# Patient Record
Sex: Male | Born: 1935 | Race: White | Hispanic: No | Smoking: Former smoker
Health system: Southern US, Community
[De-identification: ages and names within clinical notes are randomized; demographics above are authoritative.]

## PROBLEM LIST (undated history)

## (undated) DIAGNOSIS — E039 Hypothyroidism, unspecified: Secondary | ICD-10-CM

## (undated) DIAGNOSIS — R0600 Dyspnea, unspecified: Secondary | ICD-10-CM

## (undated) DIAGNOSIS — Z8489 Family history of other specified conditions: Secondary | ICD-10-CM

## (undated) DIAGNOSIS — K449 Diaphragmatic hernia without obstruction or gangrene: Secondary | ICD-10-CM

## (undated) DIAGNOSIS — N289 Disorder of kidney and ureter, unspecified: Secondary | ICD-10-CM

## (undated) DIAGNOSIS — E119 Type 2 diabetes mellitus without complications: Secondary | ICD-10-CM

## (undated) DIAGNOSIS — E7800 Pure hypercholesterolemia, unspecified: Secondary | ICD-10-CM

## (undated) DIAGNOSIS — I10 Essential (primary) hypertension: Secondary | ICD-10-CM

## (undated) DIAGNOSIS — K219 Gastro-esophageal reflux disease without esophagitis: Secondary | ICD-10-CM

## (undated) DIAGNOSIS — T8859XA Other complications of anesthesia, initial encounter: Secondary | ICD-10-CM

## (undated) DIAGNOSIS — C3490 Malignant neoplasm of unspecified part of unspecified bronchus or lung: Secondary | ICD-10-CM

## (undated) DIAGNOSIS — M1990 Unspecified osteoarthritis, unspecified site: Secondary | ICD-10-CM

## (undated) HISTORY — PX: APPENDECTOMY: SHX54

## (undated) HISTORY — PX: TONSILLECTOMY: SUR1361

## (undated) HISTORY — PX: CERVICAL SPINE SURGERY: SHX589

## (undated) HISTORY — PX: EYE SURGERY: SHX253

---

## 2002-05-03 ENCOUNTER — Ambulatory Visit (HOSPITAL_COMMUNITY): Admission: RE | Admit: 2002-05-03 | Discharge: 2002-05-03 | Payer: Self-pay | Admitting: Pulmonary Disease

## 2002-05-16 ENCOUNTER — Ambulatory Visit (HOSPITAL_COMMUNITY): Admission: RE | Admit: 2002-05-16 | Discharge: 2002-05-16 | Payer: Self-pay | Admitting: Pulmonary Disease

## 2002-08-31 ENCOUNTER — Encounter: Payer: Self-pay | Admitting: Neurosurgery

## 2002-09-06 ENCOUNTER — Encounter: Payer: Self-pay | Admitting: Neurosurgery

## 2002-09-06 ENCOUNTER — Inpatient Hospital Stay (HOSPITAL_COMMUNITY): Admission: RE | Admit: 2002-09-06 | Discharge: 2002-09-08 | Payer: Self-pay | Admitting: Neurosurgery

## 2003-01-05 ENCOUNTER — Ambulatory Visit (HOSPITAL_COMMUNITY): Admission: RE | Admit: 2003-01-05 | Discharge: 2003-01-05 | Payer: Self-pay | Admitting: General Surgery

## 2004-11-21 ENCOUNTER — Ambulatory Visit (HOSPITAL_COMMUNITY): Admission: RE | Admit: 2004-11-21 | Discharge: 2004-11-21 | Payer: Self-pay | Admitting: Pulmonary Disease

## 2005-12-10 ENCOUNTER — Ambulatory Visit (HOSPITAL_COMMUNITY): Admission: RE | Admit: 2005-12-10 | Discharge: 2005-12-10 | Payer: Self-pay | Admitting: Pulmonary Disease

## 2006-06-15 ENCOUNTER — Ambulatory Visit (HOSPITAL_COMMUNITY): Admission: RE | Admit: 2006-06-15 | Discharge: 2006-06-15 | Payer: Self-pay | Admitting: Pulmonary Disease

## 2008-04-22 ENCOUNTER — Emergency Department (HOSPITAL_COMMUNITY): Admission: EM | Admit: 2008-04-22 | Discharge: 2008-04-22 | Payer: Self-pay | Admitting: Emergency Medicine

## 2009-01-30 ENCOUNTER — Ambulatory Visit (HOSPITAL_COMMUNITY): Admission: RE | Admit: 2009-01-30 | Discharge: 2009-01-30 | Payer: Self-pay | Admitting: Pulmonary Disease

## 2009-02-24 ENCOUNTER — Emergency Department (HOSPITAL_COMMUNITY): Admission: EM | Admit: 2009-02-24 | Discharge: 2009-02-24 | Payer: Self-pay | Admitting: Family Medicine

## 2010-03-05 ENCOUNTER — Ambulatory Visit (HOSPITAL_COMMUNITY): Admission: RE | Admit: 2010-03-05 | Discharge: 2010-03-05 | Payer: Self-pay | Admitting: General Surgery

## 2010-06-17 ENCOUNTER — Ambulatory Visit (HOSPITAL_COMMUNITY): Admission: RE | Admit: 2010-06-17 | Discharge: 2010-06-17 | Payer: Self-pay | Admitting: Pulmonary Disease

## 2010-11-12 ENCOUNTER — Other Ambulatory Visit (HOSPITAL_COMMUNITY): Payer: Self-pay | Admitting: Pulmonary Disease

## 2010-11-14 ENCOUNTER — Ambulatory Visit (HOSPITAL_COMMUNITY)
Admission: RE | Admit: 2010-11-14 | Discharge: 2010-11-14 | Disposition: A | Discharge status: Home / Self Care | Payer: MEDICARE | Source: Ambulatory Visit | Attending: Pulmonary Disease | Admitting: Pulmonary Disease

## 2010-11-14 DIAGNOSIS — Z09 Encounter for follow-up examination after completed treatment for conditions other than malignant neoplasm: Secondary | ICD-10-CM | POA: Insufficient documentation

## 2010-11-14 DIAGNOSIS — K7689 Other specified diseases of liver: Secondary | ICD-10-CM | POA: Insufficient documentation

## 2010-11-14 DIAGNOSIS — R935 Abnormal findings on diagnostic imaging of other abdominal regions, including retroperitoneum: Secondary | ICD-10-CM | POA: Insufficient documentation

## 2010-11-14 MED ORDER — IOHEXOL 300 MG/ML  SOLN
100.0000 mL | Freq: Once | INTRAMUSCULAR | Status: AC | PRN
  Administered 2010-11-14: 100 mL via INTRAVENOUS

## 2011-01-14 LAB — ROCKY MTN SPOTTED FVR AB, IGG-BLOOD: RMSF IgG: 0.34 IV

## 2011-01-14 LAB — ROCKY MTN SPOTTED FVR AB, IGM-BLOOD: RMSF IgM: 0.34 IV (ref 0.00–0.89)

## 2011-02-21 NOTE — Op Note (Signed)
NAME:  William Wells, STIEG NO.:  [id number]   MEDICAL RECORD NO.:  [id number]                   PATIENT TYPE:  INP   LOCATION:  NA                                   FACILITY:  [facility]   PHYSICIAN:  Marchia Meiers. Vertell Limber, M.D.               DATE OF BIRTH:  08-30-1934   DATE OF PROCEDURE:  09/06/2002  DATE OF DISCHARGE:                                 OPERATIVE REPORT   PREOPERATIVE DIAGNOSIS:  Cervical spondylosis with myelopathy, cervical  spinal stenosis C3 through C6 levels with degenerative disk disease and  cervical radiculopathy.   POSTOPERATIVE DIAGNOSIS:  Cervical spondylosis with myelopathy, cervical  spinal stenosis C3 through C6 levels with degenerative disk disease and  cervical radiculopathy.   OPERATION PERFORMED:  Posterior cervical laminectomy C2-3 to C5-7 levels.   SURGEON:  Marchia Meiers. Vertell Limber, M.D.   ASSISTANT:  Ashok Pall, M.D.   ANESTHESIA:  General endotracheal.   ESTIMATED BLOOD LOSS:  100 cc.   COMPLICATIONS:  None.   DISPOSITION:  Recovery.   INDICATIONS FOR PROCEDURE:  The patient is a 77 year old man with cervical  spondylitic myelopathy.  He has had an autofusion at C4-5 and has marked  cervical spondylosis with canal compression from C3 through the C6 levels.  It was elected to take him to surgery for decompressive cervical laminectomy  for spinal stenosis.   DESCRIPTION OF PROCEDURE:  [name] was brought to the operating room.  Following the satisfactory and uncomplicated induction of general  endotracheal anesthesia and placement of intravenous lines and a Foley  catheter, he was turned into prone position on the operating table after  placing his head in three pin head fixation.  His neck was maintained in  neutral alignment.  His posterior neck was then prepped and draped in the  usual sterile fashion.  The area of planned incision was infiltrated with  0.25% Marcaine and 0.5% lidocaine 1:200,000 epinephrine.  An  incision was  made in the midline and carried through the subcutaneous tissues to the  posterior cervical fascia which was incised bilaterally.  Subperiosteal  dissection was performed exposing the C2 through C6 spinous processes and  subperiosteal dissection was performed exposing the C3 through C6 lateral  masses.  Self-retaining retractors were placed to facilitate exposure after  confirmation of level with markers at the C3 and C4 spinous processes.  The  C3 through C6 spinous processes were removed and using Leksell rongeur, 2  and 3 mm gold tipped Kerrison rongeurs and the high speed drill with AM8  equivalent bur, a total laminectomy C3 through C6 was performed.  The spinal  canal dura was decompressed extensively over this region resulting in marked  decompression of the spinal canal.  Hemostasis was assured with bipolar  electrocautery, Gelfoam soaked in thrombin and also with bone wax on the  bony edges.  The self-retaining retractors were then removed  and tissues  were inspected and found to be in good repair with no evidence of any  bleeding. Nonetheless, a 7 mm Jackson-Pratt drain was placed to assure a dry  field.  The posterior cervical fascia was then closed with 0 Vicryl sutures.  Subcutaneous tissues were reapproximated with 0 and 2-0 Vicryl inverted  interrupted sutures and the skin edges were reapproximated with interrupted  3-0 Vicryl subcuticular stitch, the wound was dressed with Dermabond.  A 3-0  nylon drain stitch was placed with occlusive dressing over that.  The  patient was then returned to supine position and taken out of three pin head  fixation and was extubated in the operating room and taken to the recovery  room in stable and satisfactory condition having tolerated the operation  well.  Counts were correct at the end of the case.  The entire operation was  done under loupe magnification to facilitate visualization.                                                  Marchia Meiers. Vertell Limber, M.D.    JDS/MEDQ  D:  09/06/2002  T:  09/06/2002  Job:  [id number]

## 2011-02-21 NOTE — H&P (Signed)
   NAME:  William Wells, GEGENHEIMER NO.:  [id number]   MEDICAL RECORD NO.:  [id number]                  PATIENT TYPE:   LOCATION:                                       FACILITY:   PHYSICIAN:  Jamesetta So, M.D.               DATE OF BIRTH:  28-Nov-1933   DATE OF ADMISSION:  DATE OF DISCHARGE:                                HISTORY & PHYSICAL   CHIEF COMPLAINT:  Hematochezia.   HISTORY OF PRESENT ILLNESS:  The patient is a 77 year old white male who is  referred for a colonoscopy.  He has been having hematochezia over the past  few months.  He denies any other abdominal complaints.  He denies  hemorrhoidal problems.  He has never had a colonoscopy.  There is no  immediate family history of colon carcinoma.   PAST MEDICAL HISTORY:  Indigestion.   PAST SURGICAL HISTORY:  1. Neck surgery.  2. Appendectomy.   CURRENT MEDICATIONS:  Nexium.   ALLERGIES:  No known drug allergies.   REVIEW OF SYSTEMS:  Noncontributory.   PHYSICAL EXAMINATION:  GENERAL:  The patient is a well-developed, well-  nourished white male in no acute distress.  He is afebrile.  VITAL SIGNS:  Stable.  LUNGS:  Clear to auscultation with equal breath sounds bilaterally.  HEART:  Regular rate and rhythm without S3, S4 or murmurs.  ABDOMEN:  Soft, nontender, nondistended.  No hepatosplenomegaly, masses or  hernias are noted.  RECTAL:  Deferred to the procedure.   IMPRESSION:  Hematochezia.    PLAN:  The patient is scheduled for a colonoscopy on 01/05/03.  The risks  and benefits of the procedure including bleeding and perforation were fully  explained to the patient, gave informed consent.                                                 Jamesetta So, M.D.    MAJ/MEDQ  D:  12/29/2002  T:  12/29/2002  Job:  [id number]   cc:   Percell Miller L. Luan Pulling, M.D.  [place]  [place] [address]  Fax: [phone]

## 2011-07-04 LAB — URINALYSIS, ROUTINE W REFLEX MICROSCOPIC
Bilirubin Urine: NEGATIVE
Hgb urine dipstick: NEGATIVE
Ketones, ur: NEGATIVE
Protein, ur: NEGATIVE
Urobilinogen, UA: 0.2

## 2011-07-04 LAB — COMPREHENSIVE METABOLIC PANEL
ALT: 37
AST: 23
Albumin: 3.3 — ABNORMAL LOW
Alkaline Phosphatase: 50
Glucose, Bld: 132 — ABNORMAL HIGH
Potassium: 3.9
Sodium: 133 — ABNORMAL LOW
Total Protein: 6.6

## 2011-07-04 LAB — CBC
Hemoglobin: 14.2
RDW: 14

## 2011-07-04 LAB — DIFFERENTIAL
Basophils Relative: 1
Eosinophils Absolute: 0.1
Eosinophils Relative: 2
Monocytes Absolute: 0.8
Monocytes Relative: 16 — ABNORMAL HIGH
Neutrophils Relative %: 41 — ABNORMAL LOW

## 2011-10-25 ENCOUNTER — Emergency Department (HOSPITAL_COMMUNITY)
Admission: EM | Admit: 2011-10-25 | Discharge: 2011-10-25 | Disposition: A | Discharge status: Home / Self Care | Payer: Medicare Other | Source: Home / Self Care

## 2011-10-25 ENCOUNTER — Encounter (HOSPITAL_COMMUNITY): Payer: Self-pay | Admitting: *Deleted

## 2011-10-25 DIAGNOSIS — M25569 Pain in unspecified knee: Secondary | ICD-10-CM

## 2011-10-25 DIAGNOSIS — M25562 Pain in left knee: Secondary | ICD-10-CM

## 2011-10-25 HISTORY — DX: Gastro-esophageal reflux disease without esophagitis: K21.9

## 2011-10-25 HISTORY — DX: Pure hypercholesterolemia, unspecified: E78.00

## 2011-10-25 MED ORDER — PERCOCET 5-325 MG PO TABS: 1.0000 | ORAL_TABLET | Freq: Four times a day (QID) | ORAL | Status: AC | PRN

## 2011-10-25 NOTE — ED Provider Notes (Signed)
History     CSN: [id number]  Arrival date & time 10/25/11  1058   None     Chief Complaint  Patient presents with  . Knee Pain  . Leg Pain  . Fall    (Consider location/radiation/quality/duration/timing/severity/associated sxs/prior treatment) HPI Comments: Patient reports he fell and twisted his left knee in December - was seen in ED in [place] and put in knee immobilizer and walker with PCP follow up.  Patient has since called his PCP but has not been seen, has stopped wearing the immobilizer and using the walker.  Reports continued pain at medial aspect of knee, exacerbated by palpation and walking.  Pain is described as aching.  Denies any new injury to knee.  Denies fever, swelling of leg, CP, SOB. 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  Is taking hydrocodone-APAP 5-500 and naprosyn without relief.    Patient is a 78 y.o. male presenting with knee pain, leg pain, and fall. The history is provided by the patient.  Knee Pain  Leg Pain   Fall    Past Medical History  Diagnosis Date  . Diabetes mellitus   . Acid reflux disease   . High cholesterol     History reviewed. No pertinent past surgical history.  History reviewed. No pertinent family history.  History  Substance Use Topics  . Smoking status: Never Smoker   . Smokeless tobacco: Not on file  . Alcohol Use: No      Review of Systems  All other systems reviewed and are negative.    Allergies  Review of patient's allergies indicates no known allergies.  Home Medications   Current Outpatient Rx  Name Route Sig Dispense Refill  . HYDROCODONE-ACETAMINOPHEN 5-500 MG PO TABS Oral Take 1 tablet by mouth every 6 (six) hours as needed.    Marland Kitchen METFORMIN HCL 500 MG PO TABS Oral Take 1,000 mg by mouth 2 (two) times daily.     Marland Kitchen NAPROXEN 500 MG PO TABS Oral Take 500 mg by mouth 4 (four) times daily.    Marland Kitchen OMEPRAZOLE 20 MG PO CPDR Oral Take 20 mg by mouth daily.    Marland Kitchen  SIMVASTATIN 40 MG PO TABS Oral Take 40 mg by mouth every evening.      BP 133/73  Pulse 93  Temp(Src) 97.8 F (36.6 C) (Oral)  Resp 16  SpO2 98%  Physical Exam  Nursing note and vitals reviewed. Constitutional: He is oriented to person, place, and time. He appears well-developed and well-nourished.  HENT:  Head: Normocephalic and atraumatic.  Neck: Neck supple.  Pulmonary/Chest: Effort normal.  Musculoskeletal: Normal range of motion. He exhibits tenderness. He exhibits no edema.       Legs: Neurological: He is alert and oriented to person, place, and time.    ED Course  Procedures (including critical care time)  Labs Reviewed - No data to display No results found.   1. Pain in left knee       MDM  Patient with injury to left knee several weeks ago, patient has decreased conservative management at home with increased pain.  Patient has also not followed up with PCP for further evaluation.  No new injury.  Suspect injury to [facility] - patient unable to tolerate knee immobilizer.  Negotiated work note- patient states he needs to go back to work, is scheduled 5 days from now.  I have written him out for tomorrow's work to  allow him to follow up with orthopedist and with PCP for reevaluation if unable to see orthopedists prior to return to work.  Pt encouraged to use walker at home.  Discussed safety with use of tylenol-containing prescriptions.  Pt verbalizes understanding.          Otis Brace, [place] 10/25/11 1257

## 2011-10-25 NOTE — ED Notes (Signed)
Pt fell end of December injured left knee - seen and [place] time [place] - knee immobilizer and walker   Was told to follow up with primary care physician in [place] per pt has called twice no response

## 2011-10-25 NOTE — ED Provider Notes (Signed)
Medical screening examination/treatment/procedure(s) were performed by non-physician practitioner and as supervising physician I was immediately available for consultation/collaboration.  Howell Rucks, MD 10/25/11 2114

## 2011-10-25 NOTE — ED Notes (Signed)
Large knee sleeve applied

## 2012-08-23 ENCOUNTER — Encounter: Payer: Self-pay | Admitting: Family Medicine

## 2012-09-07 ENCOUNTER — Other Ambulatory Visit (HOSPITAL_COMMUNITY): Payer: Self-pay | Admitting: Pulmonary Disease

## 2012-09-07 DIAGNOSIS — R1032 Left lower quadrant pain: Secondary | ICD-10-CM

## 2012-09-10 ENCOUNTER — Ambulatory Visit (HOSPITAL_COMMUNITY)
Admission: RE | Admit: 2012-09-10 | Discharge: 2012-09-10 | Disposition: A | Discharge status: Home / Self Care | Payer: Medicare Other | Source: Ambulatory Visit | Attending: Pulmonary Disease | Admitting: Pulmonary Disease

## 2012-09-10 DIAGNOSIS — K5730 Diverticulosis of large intestine without perforation or abscess without bleeding: Secondary | ICD-10-CM | POA: Insufficient documentation

## 2012-09-10 DIAGNOSIS — R1032 Left lower quadrant pain: Secondary | ICD-10-CM | POA: Insufficient documentation

## 2013-12-07 ENCOUNTER — Other Ambulatory Visit (HOSPITAL_COMMUNITY): Payer: Self-pay | Admitting: Pulmonary Disease

## 2013-12-07 DIAGNOSIS — G44009 Cluster headache syndrome, unspecified, not intractable: Secondary | ICD-10-CM

## 2013-12-13 ENCOUNTER — Ambulatory Visit (HOSPITAL_COMMUNITY)
Admission: RE | Admit: 2013-12-13 | Discharge: 2013-12-13 | Disposition: A | Discharge status: Home / Self Care | Payer: Medicare HMO | Source: Ambulatory Visit | Attending: Pulmonary Disease | Admitting: Pulmonary Disease

## 2013-12-13 DIAGNOSIS — J329 Chronic sinusitis, unspecified: Secondary | ICD-10-CM | POA: Insufficient documentation

## 2013-12-13 DIAGNOSIS — G44009 Cluster headache syndrome, unspecified, not intractable: Secondary | ICD-10-CM

## 2013-12-13 DIAGNOSIS — R51 Headache: Secondary | ICD-10-CM | POA: Insufficient documentation

## 2014-10-18 ENCOUNTER — Encounter: Payer: Self-pay | Admitting: Family Medicine

## 2015-04-24 ENCOUNTER — Other Ambulatory Visit (HOSPITAL_COMMUNITY): Payer: Self-pay | Admitting: Pulmonary Disease

## 2015-04-24 ENCOUNTER — Ambulatory Visit (HOSPITAL_COMMUNITY)
Admission: RE | Admit: 2015-04-24 | Discharge: 2015-04-24 | Disposition: A | Discharge status: Home / Self Care | Payer: Medicare HMO | Source: Ambulatory Visit | Attending: Pulmonary Disease | Admitting: Pulmonary Disease

## 2015-04-24 DIAGNOSIS — M1612 Unilateral primary osteoarthritis, left hip: Secondary | ICD-10-CM | POA: Insufficient documentation

## 2015-04-24 DIAGNOSIS — M25552 Pain in left hip: Secondary | ICD-10-CM | POA: Diagnosis present

## 2016-08-18 ENCOUNTER — Encounter (HOSPITAL_COMMUNITY): Payer: Self-pay | Admitting: Emergency Medicine

## 2016-08-18 ENCOUNTER — Emergency Department (HOSPITAL_COMMUNITY)
Admission: EM | Admit: 2016-08-18 | Discharge: 2016-08-18 | Disposition: A | Discharge status: Home / Self Care | Payer: Medicare HMO | Attending: Emergency Medicine | Admitting: Emergency Medicine

## 2016-08-18 ENCOUNTER — Emergency Department (HOSPITAL_COMMUNITY): Payer: Medicare HMO

## 2016-08-18 DIAGNOSIS — Z79899 Other long term (current) drug therapy: Secondary | ICD-10-CM | POA: Diagnosis not present

## 2016-08-18 DIAGNOSIS — Y929 Unspecified place or not applicable: Secondary | ICD-10-CM | POA: Diagnosis not present

## 2016-08-18 DIAGNOSIS — E119 Type 2 diabetes mellitus without complications: Secondary | ICD-10-CM | POA: Insufficient documentation

## 2016-08-18 DIAGNOSIS — Z87891 Personal history of nicotine dependence: Secondary | ICD-10-CM | POA: Insufficient documentation

## 2016-08-18 DIAGNOSIS — W19XXXA Unspecified fall, initial encounter: Secondary | ICD-10-CM

## 2016-08-18 DIAGNOSIS — S4991XA Unspecified injury of right shoulder and upper arm, initial encounter: Secondary | ICD-10-CM | POA: Diagnosis present

## 2016-08-18 DIAGNOSIS — Y9301 Activity, walking, marching and hiking: Secondary | ICD-10-CM | POA: Insufficient documentation

## 2016-08-18 DIAGNOSIS — W010XXA Fall on same level from slipping, tripping and stumbling without subsequent striking against object, initial encounter: Secondary | ICD-10-CM | POA: Diagnosis not present

## 2016-08-18 DIAGNOSIS — S40011A Contusion of right shoulder, initial encounter: Secondary | ICD-10-CM

## 2016-08-18 DIAGNOSIS — Y999 Unspecified external cause status: Secondary | ICD-10-CM | POA: Insufficient documentation

## 2016-08-18 DIAGNOSIS — Z7984 Long term (current) use of oral hypoglycemic drugs: Secondary | ICD-10-CM | POA: Insufficient documentation

## 2016-08-18 MED ORDER — HYDROCODONE-ACETAMINOPHEN 5-325 MG PO TABS
1.0000 | ORAL_TABLET | Freq: Once | ORAL | Status: AC
  Administered 2016-08-18: 1 via ORAL
  Filled 2016-08-18: qty 1

## 2016-08-18 MED ORDER — HYDROCODONE-ACETAMINOPHEN 5-325 MG PO TABS: 1.0000 | ORAL_TABLET | ORAL | 0 refills | Status: DC | PRN

## 2016-08-18 NOTE — ED Provider Notes (Signed)
[facility] DEPT Provider Note   CSN: [id number] Arrival date & time: 08/18/16  2056     History   Chief Complaint Chief Complaint  Patient presents with  . Fall    HPI William Wells is a 82 y.o. male with non contributory past medical history presenting for evaluation of right shoulder pain from injury sustained by fall today at 2 pm.  He slipped walking on wet grass on an incline landing directly on his right  shoulder.  He denies head injury, neck pain and LOC. His pain worsens with certain movement and is unable to raise the arm without severe pain and radiates to his mid upper arm.  He has taken tylenol, last dose taken around 5 pm today but with no significant relief. He denies numbess or weakness in his arms or hands.  He has seen Dr Luna Glasgow in the past for a left rotator cuff injury.  The history is provided by the patient.    Past Medical History:  Diagnosis Date  . Acid reflux disease   . Diabetes mellitus   . High cholesterol     There are no active problems to display for this patient.   Past Surgical History:  Procedure Laterality Date  . APPENDECTOMY    . EYE SURGERY    . TONSILLECTOMY         Home Medications    Prior to Admission medications   Medication Sig Start Date End Date Taking? Authorizing Provider  HYDROcodone-acetaminophen (NORCO/VICODIN) 5-325 MG tablet Take 1 tablet by mouth every 4 (four) hours as needed. 08/18/16   Evalee Jefferson, PA-C  metFORMIN (GLUCOPHAGE) 500 MG tablet Take 1,000 mg by mouth 2 (two) times daily.     Historical Provider, MD  naproxen (NAPROSYN) 500 MG tablet Take 500 mg by mouth 4 (four) times daily.    Historical Provider, MD  omeprazole (PRILOSEC) 20 MG capsule Take 20 mg by mouth daily.    Historical Provider, MD  simvastatin (ZOCOR) 40 MG tablet Take 40 mg by mouth every evening.    Historical Provider, MD    Family History Family History  Problem Relation Age of Onset  . Diabetes Mother   . Cancer Father     . Cancer Sister   . Diabetes Brother   . Cancer Brother     Social History Social History  Substance Use Topics  . Smoking status: Former Research scientist (life sciences)  . Smokeless tobacco: Never Used  . Alcohol use No     Allergies   Patient has no known allergies.   Review of Systems Review of Systems  Constitutional: Negative for fever.  Musculoskeletal: Positive for arthralgias. Negative for joint swelling, myalgias, neck pain and neck stiffness.  Neurological: Negative for weakness and numbness.     Physical Exam Updated Vital Signs BP 168/82   Pulse 78   Temp 98 F (36.7 C) (Tympanic)   Resp 18   Ht '5\' 8"'$  (1.727 m)   Wt 88.9 kg   SpO2 100%   BMI 29.80 kg/m   Physical Exam  Constitutional: He appears well-developed and well-nourished.  HENT:  Head: Atraumatic.  Neck: Normal range of motion.  Cardiovascular:  Pulses:      Radial pulses are 2+ on the right side, and 2+ on the left side.  Pulses equal bilaterally  Musculoskeletal: He exhibits tenderness. He exhibits no edema or deformity.       Right shoulder: He exhibits decreased range of motion and bony tenderness. He exhibits  no swelling, no effusion, no crepitus, no deformity and normal pulse.       Cervical back: He exhibits no bony tenderness.  ttp anterior proximal humerus.  No palpable deformity.  Well healed posterior midline surgical incision. No palpable bicep deformity.   Neurological: He is alert. He has normal strength. He displays normal reflexes. No sensory deficit.  Skin: Skin is warm and dry.  Psychiatric: He has a normal mood and affect.     ED Treatments / Results  Labs (all labs ordered are listed, but only abnormal results are displayed) Labs Reviewed - No data to display  EKG  EKG Interpretation None       Radiology Dg Shoulder Right  Result Date: 08/18/2016 CLINICAL DATA:  Slipped and fell on wet grass.  Shoulder pain. EXAM: RIGHT SHOULDER - 2+ VIEW; RIGHT HUMERUS - 2+ VIEW COMPARISON:   None. FINDINGS: The humeral head is well-formed and located. The subacromial, glenohumeral and acromioclavicular joint spaces are intact. Severe acromioclavicular osteoarthrosis with undersurface spurring. Sclerosis greater tuberosity. Sub cm subacromial No destructive bony lesions. Soft tissue planes are non-suspicious. IMPRESSION: No acute fracture deformity or dislocation. Severe acromioclavicular osteoarthrosis. Subacromial loose body versus calcific tendinopathy. Electronically Signed   By: Elon Alas M.D.   On: 08/18/2016 21:36   Dg Humerus Right  Result Date: 08/18/2016 CLINICAL DATA:  Slipped and fell on wet grass.  Shoulder pain. EXAM: RIGHT SHOULDER - 2+ VIEW; RIGHT HUMERUS - 2+ VIEW COMPARISON:  None. FINDINGS: The humeral head is well-formed and located. The subacromial, glenohumeral and acromioclavicular joint spaces are intact. Severe acromioclavicular osteoarthrosis with undersurface spurring. Sclerosis greater tuberosity. Sub cm subacromial No destructive bony lesions. Soft tissue planes are non-suspicious. IMPRESSION: No acute fracture deformity or dislocation. Severe acromioclavicular osteoarthrosis. Subacromial loose body versus calcific tendinopathy. Electronically Signed   By: Elon Alas M.D.   On: 08/18/2016 21:36    Procedures Procedures (including critical care time)  Medications Ordered in ED Medications  HYDROcodone-acetaminophen (NORCO/VICODIN) 5-325 MG per tablet 1 tablet (1 tablet Oral Given 08/18/16 2244)     Initial Impression / Assessment and Plan / ED Course  I have reviewed the triage vital signs and the nursing notes.  Pertinent labs & imaging results that were available during my care of the patient were reviewed by me and considered in my medical decision making (see chart for details).  Clinical Course     Pt with fall on right shoulder with no acute trauma findings,  Discussed possibility of rotator injury.  He was placed in a sling,  discussed maintaining ROM with small circular movements.  Hydrocodone, ice.  F/u with Dr Luna Glasgow if sx are not improving with this tx.   Pt was seen by Dr. Laverta Baltimore prior to dc home.  Final Clinical Impressions(s) / ED Diagnoses   Final diagnoses:  Contusion of right shoulder, initial encounter  Fall, initial encounter    New Prescriptions Discharge Medication List as of 08/18/2016 10:45 PM    START taking these medications   Details  HYDROcodone-acetaminophen (NORCO/VICODIN) 5-325 MG tablet Take 1 tablet by mouth every 4 (four) hours as needed., Starting Mon 08/18/2016, Print         Evalee Jefferson, PA-C 08/19/16 0033    Margette Fast, MD 08/19/16 1000

## 2016-08-18 NOTE — Discharge Instructions (Signed)
Your xrays do not show any obvious injury today but you may need to be further assessed for a possible rotator injury if your symptoms do not improve with todays conservative treatment.  You may take the hydrocodone prescribed for pain relief.  This will make you drowsy - do not drive within 4 hours of taking this medication.  Wear the sling for comfort.  Make sure you maintain range of motion by doing small circles with your right arm as discussed (so the shoulder does not stiffen).

## 2016-08-18 NOTE — ED Triage Notes (Signed)
Pt slipped on wet grass and fell on his R shoulder. Pt c/o pain and inability to raise his R arm.

## 2018-02-14 ENCOUNTER — Encounter (HOSPITAL_COMMUNITY): Payer: Self-pay | Admitting: *Deleted

## 2018-02-14 ENCOUNTER — Other Ambulatory Visit: Payer: Self-pay

## 2018-02-14 ENCOUNTER — Emergency Department (HOSPITAL_COMMUNITY)
Admission: EM | Admit: 2018-02-14 | Discharge: 2018-02-14 | Disposition: A | Discharge status: Home / Self Care | Payer: Medicare HMO | Attending: Emergency Medicine | Admitting: Emergency Medicine

## 2018-02-14 ENCOUNTER — Emergency Department (HOSPITAL_COMMUNITY): Payer: Medicare HMO

## 2018-02-14 DIAGNOSIS — Z79899 Other long term (current) drug therapy: Secondary | ICD-10-CM | POA: Diagnosis not present

## 2018-02-14 DIAGNOSIS — M79652 Pain in left thigh: Secondary | ICD-10-CM | POA: Diagnosis not present

## 2018-02-14 DIAGNOSIS — Z7984 Long term (current) use of oral hypoglycemic drugs: Secondary | ICD-10-CM | POA: Diagnosis not present

## 2018-02-14 DIAGNOSIS — Z87891 Personal history of nicotine dependence: Secondary | ICD-10-CM | POA: Diagnosis not present

## 2018-02-14 DIAGNOSIS — E119 Type 2 diabetes mellitus without complications: Secondary | ICD-10-CM | POA: Insufficient documentation

## 2018-02-14 DIAGNOSIS — M25552 Pain in left hip: Secondary | ICD-10-CM | POA: Diagnosis not present

## 2018-02-14 MED ORDER — METHOCARBAMOL 500 MG PO TABS: 500.0000 mg | ORAL_TABLET | Freq: Every evening | ORAL | 0 refills | Status: DC | PRN

## 2018-02-14 MED ORDER — HYDROCODONE-ACETAMINOPHEN 5-325 MG PO TABS: 1.0000 | ORAL_TABLET | ORAL | 0 refills | Status: DC | PRN

## 2018-02-14 MED ORDER — HYDROCODONE-ACETAMINOPHEN 5-325 MG PO TABS
1.0000 | ORAL_TABLET | Freq: Once | ORAL | Status: AC
  Administered 2018-02-14: 1 via ORAL
  Filled 2018-02-14: qty 1

## 2018-02-14 MED ORDER — METHOCARBAMOL 500 MG PO TABS
500.0000 mg | ORAL_TABLET | Freq: Once | ORAL | Status: AC
Start: 2018-02-14 — End: 2018-02-14
  Administered 2018-02-14: 500 mg via ORAL
  Filled 2018-02-14: qty 1

## 2018-02-14 NOTE — ED Triage Notes (Signed)
Left hip pain for 3 days no known injury  Maybe hx of the same

## 2018-02-14 NOTE — Discharge Instructions (Signed)
Ice/heat - ice for 20 minutes at a time, several times a day Continue Naproxen Start Norco for severe pain Take Robaxin for muscle spasms Follow up with orthopedics

## 2018-02-14 NOTE — ED Provider Notes (Signed)
[facility] EMERGENCY DEPARTMENT Provider Note   CSN: [id number] Arrival date & time: 02/14/18  1505     History   Chief Complaint Chief Complaint  Patient presents with  . Hip Pain    HPI William Wells is a 84 y.o. male who presents with left hip and thigh pain.  Past medical history significant for reflux, diabetes, high cholesterol, spinal stenosis, hip arthritis.  He states that the pain started acutely 4 to 5 days ago.  It has gradually worsened.  He states it feels like a "abscessed tooth" and cramping.  It is over the left hip area and radiates down to the thigh.  The pain is worse with movement and walking.  Is better with sitting and using his heating pad.  He has been taking Tylenol for pain without significant relief.  He does have pain from his arthritis however this usually gets better throughout the day and is milder.  He has not had a fever, chills, abdominal pain, nausea, vomiting, urinary symptoms.  He denies numbness, tingling, weakness of the leg.  HPI  Past Medical History:  Diagnosis Date  . Acid reflux disease   . Diabetes mellitus   . High cholesterol     There are no active problems to display for this patient.   Past Surgical History:  Procedure Laterality Date  . APPENDECTOMY    . EYE SURGERY    . TONSILLECTOMY          Home Medications    Prior to Admission medications   Medication Sig Start Date End Date Taking? Authorizing Provider  HYDROcodone-acetaminophen (NORCO/VICODIN) 5-325 MG tablet Take 1 tablet by mouth every 4 (four) hours as needed. 08/18/16   Evalee Jefferson, PA-C  metFORMIN (GLUCOPHAGE) 500 MG tablet Take 1,000 mg by mouth 2 (two) times daily.     [provider]  naproxen (NAPROSYN) 500 MG tablet Take 500 mg by mouth 4 (four) times daily.    [provider]  omeprazole (PRILOSEC) 20 MG capsule Take 20 mg by mouth daily.    [provider]  simvastatin (ZOCOR) 40 MG tablet Take 40 mg by  mouth every evening.    [provider]    Family History Family History  Problem Relation Age of Onset  . Diabetes Mother   . Cancer Father   . Cancer Sister   . Diabetes Brother   . Cancer Brother     Social History Social History   Tobacco Use  . Smoking status: Former Research scientist (life sciences)  . Smokeless tobacco: Never Used  Substance Use Topics  . Alcohol use: No  . Drug use: No     Allergies   Patient has no known allergies.   Review of Systems Review of Systems  Constitutional: Negative for fever.  Gastrointestinal: Negative for abdominal pain, nausea and vomiting.  Musculoskeletal: Positive for arthralgias and myalgias. Negative for back pain and neck pain.  Skin: Negative for wound.  Neurological: Negative for weakness and numbness.  All other systems reviewed and are negative.    Physical Exam Updated Vital Signs BP (!) 135/108   Pulse 94   Temp 99.8 F (37.7 C)   Resp 20   Ht 5\' 8"  (1.727 m)   Wt 87.5 kg (193 lb)   SpO2 99%   BMI 29.35 kg/m   Physical Exam  Constitutional: He is oriented to person, place, and time. He appears well-developed and well-nourished. No distress.  Calm and cooperative.  HENT:  Head: Normocephalic and atraumatic.  Eyes: Pupils are equal, round, and reactive to light. Conjunctivae are normal. Right eye exhibits no discharge. Left eye exhibits no discharge. No scleral icterus.  Neck: Normal range of motion.  Cardiovascular: Normal rate and regular rhythm.  Pulmonary/Chest: Effort normal and breath sounds normal. No respiratory distress.  Abdominal: Soft. Bowel sounds are normal. He exhibits no distension. There is no tenderness.  Musculoskeletal:  Back: Inspection: No masses, deformity, or rash Palpation: No midline spinal tenderness. No paraspinal muscle tenderness. Tenderness over lateral hip and buttocks tenderness Strength: 5/5 in lower extremities and normal plantar and dorsiflexion Sensation: Intact sensation with  light touch in lower extremities bilaterally Reflexes: Patellar reflex is 2+ bilaterally SLR: Negative seated straight leg raise Gait: Walks with a limp   Neurological: He is alert and oriented to person, place, and time.  Skin: Skin is warm and dry.  Psychiatric: He has a normal mood and affect. His behavior is normal.  Nursing note and vitals reviewed.    ED Treatments / Results  Labs (all labs ordered are listed, but only abnormal results are displayed) Labs Reviewed - No data to display  EKG None  Radiology Dg Hip Unilat W Or Wo Pelvis 2-3 Views Left  Result Date: 02/14/2018 CLINICAL DATA:  Acute LEFT hip pain for 3 days. No known injury. Initial encounter. EXAM: DG HIP (WITH OR WITHOUT PELVIS) 2-3V LEFT COMPARISON:  04/24/2015 radiographs FINDINGS: There is no evidence of acute fracture or dislocation. Mild degenerative changes in both hips identified. Moderate degenerative changes in the LOWER lumbar spine noted. IMPRESSION: 1. No evidence of acute abnormality 2. Mild degenerative changes in both hips and moderate degenerative changes in the LOWER lumbar spine. Electronically Signed   By: Margarette Canada M.D.   On: 02/14/2018 16:01    Procedures Procedures (including critical care time)  Medications Ordered in ED Medications - No data to display   Initial Impression / Assessment and Plan / ED Course  I have reviewed the triage vital signs and the nursing notes.  Pertinent labs & imaging results that were available during my care of the patient were reviewed by me and considered in my medical decision making (see chart for details).  84 year old male presents with lateral left hip pain for 5 days.  He is mildly hypertensive otherwise vital signs are normal.  Suspect greater trochanteric pain syndrome.  Also consider sciatica due to his low back or tight gluteal muscles.  X-rays remarkable for mild arthritis of the hips and moderate degenerative changes of the lumbar spine.  He  has normal strength, intact distal pulse, and is ambulatory.  We will give him a prescription for Norco and muscle relaxers and advised to try rest, ice, heat, elevation and to follow-up with orthopedics if his symptoms are not improving.  Final Clinical Impressions(s) / ED Diagnoses   Final diagnoses:  Left hip pain    ED Discharge Orders    None       Recardo Evangelist, PA-C 02/14/18 1735    Dorie Rank, MD 02/16/18 1759

## 2018-02-17 ENCOUNTER — Ambulatory Visit (INDEPENDENT_AMBULATORY_CARE_PROVIDER_SITE_OTHER): Payer: Medicare HMO | Admitting: Orthopaedic Surgery

## 2018-02-17 ENCOUNTER — Encounter (INDEPENDENT_AMBULATORY_CARE_PROVIDER_SITE_OTHER): Payer: Self-pay | Admitting: Orthopaedic Surgery

## 2018-02-17 DIAGNOSIS — M5432 Sciatica, left side: Secondary | ICD-10-CM | POA: Insufficient documentation

## 2018-02-17 MED ORDER — GABAPENTIN 300 MG PO CAPS: 300.0000 mg | ORAL_CAPSULE | Freq: Every day | ORAL | 1 refills | Status: DC

## 2018-02-17 NOTE — Progress Notes (Signed)
Office Visit Note   Patient: William Wells           Date of Birth: 1934/08/10           MRN: [id number] Visit Date: 02/17/2018              Requested by: Sinda Du, MD [address], [place] [id number] PCP: Sinda Du, MD   Assessment & Plan: Visit Diagnoses:  1. Sciatica, left side     Plan: I am concerned about the acute nature of the pain that he is having.  MRI is warranted of the lumbar spine to rule out herniated disc or nerve root compression based on his sciatic.  He will continue medications as well but I will add Neurontin 300 mg to take at night to this as well.  We will continue to ambulate his cane since he is a fall risk.  We will see him back after the MRI of his lumbar spine.  All questions concerns were answered and addressed.  Follow-Up Instructions: Return in about 2 weeks (around 03/03/2018).   Orders:  No orders of the defined types were placed in this encounter.  Meds ordered this encounter  Medications  . gabapentin (NEURONTIN) 300 MG capsule    Sig: Take 1 capsule (300 mg total) by mouth at bedtime.    Dispense:  30 capsule    Refill:  1      Procedures: No procedures performed   Clinical Data: No additional findings.   Subjective: Chief Complaint  Patient presents with  . Left Hip - Pain  The patient is very pleasant 84 year old I am seeing for the first time.  He was referred to the emergency room due to severe left hip and sciatic symptoms he got to be so bad he had problems ambulating.  He went to the emergency room and x-rays were obtained of his pelvis and he can see the lower aspect of the lumbar spine as well.  He is able with a cane.  They put him on hydrocodone but it was too strong for him.  Right now he is on tramadol methocarbamol and naproxen.  He is ambulate with a cane and still having problems with pain with weightbearing all around his hip area but it seems to be more sciatic related to the way he  describes things and points to on his low back and hip area in the back.  HPI  Review of Systems He currently denies any headache, chest pain, shortness breath, fever, chills, nausea, vomiting.  Objective: Vital Signs: There were no vitals taken for this visit.  Physical Exam He is alert and oriented x3 and in no acute distress Ortho Exam He does get up on exam table slow but he is 84 years old.  His left hip exam is entirely normal.  I can compress the hip and put him through full internal extra rotation with no pain in the groin and no pain going down to his knee.  There is no pain to palpation of the trochanteric area.  He does have pain in the ischial area in the sciatic area in the low back.  He has pain on stretch of the sciatic nerve. Specialty Comments:  No specialty comments available.  Imaging: No results found. X-rays independently reviewed of his pelvis that do show the lower aspect of the lumbar spine shows significant degenerative disc disease of the lower aspect of the lumbar spine.  His left  and right hip show no acute findings of well-maintained joint space.  PMFS History: Patient Active Problem List   Diagnosis Date Noted  . Sciatica, left side 02/17/2018   Past Medical History:  Diagnosis Date  . Acid reflux disease   . Diabetes mellitus   . High cholesterol     Family History  Problem Relation Age of Onset  . Diabetes Mother   . Cancer Father   . Cancer Sister   . Diabetes Brother   . Cancer Brother     Past Surgical History:  Procedure Laterality Date  . APPENDECTOMY    . EYE SURGERY    . TONSILLECTOMY     Social History   Occupational History  . Not on file  Tobacco Use  . Smoking status: Former Research scientist (life sciences)  . Smokeless tobacco: Never Used  Substance and Sexual Activity  . Alcohol use: No  . Drug use: No  . Sexual activity: Not on file

## 2018-02-18 ENCOUNTER — Telehealth (INDEPENDENT_AMBULATORY_CARE_PROVIDER_SITE_OTHER): Payer: Self-pay | Admitting: Orthopaedic Surgery

## 2018-02-18 ENCOUNTER — Other Ambulatory Visit (INDEPENDENT_AMBULATORY_CARE_PROVIDER_SITE_OTHER): Payer: Self-pay

## 2018-02-18 DIAGNOSIS — M4807 Spinal stenosis, lumbosacral region: Secondary | ICD-10-CM

## 2018-02-18 NOTE — Telephone Encounter (Signed)
I am aware and they did say that yesterday as well.

## 2018-02-18 NOTE — Telephone Encounter (Signed)
See below

## 2018-02-18 NOTE — Telephone Encounter (Signed)
Patients daughter came in with patient yesterday and stated patient failed to mention to MD about most of pain is when he stands and when walking. He is unable to stand up straight.  Unable to walk after 4 steps and fell because he was in so much pain.  She wanted to make MD aware.

## 2018-02-19 ENCOUNTER — Other Ambulatory Visit: Payer: Medicare HMO

## 2018-02-22 ENCOUNTER — Encounter (INDEPENDENT_AMBULATORY_CARE_PROVIDER_SITE_OTHER): Payer: Self-pay | Admitting: Orthopaedic Surgery

## 2018-02-22 ENCOUNTER — Other Ambulatory Visit (INDEPENDENT_AMBULATORY_CARE_PROVIDER_SITE_OTHER): Payer: Self-pay

## 2018-02-22 ENCOUNTER — Ambulatory Visit (INDEPENDENT_AMBULATORY_CARE_PROVIDER_SITE_OTHER): Payer: Medicare HMO | Admitting: Orthopaedic Surgery

## 2018-02-22 ENCOUNTER — Other Ambulatory Visit: Payer: Medicare HMO

## 2018-02-22 DIAGNOSIS — M25552 Pain in left hip: Secondary | ICD-10-CM

## 2018-02-22 DIAGNOSIS — S72002G Fracture of unspecified part of neck of left femur, subsequent encounter for closed fracture with delayed healing: Secondary | ICD-10-CM

## 2018-02-22 DIAGNOSIS — M5432 Sciatica, left side: Secondary | ICD-10-CM | POA: Diagnosis not present

## 2018-02-22 MED ORDER — METHOCARBAMOL 500 MG PO TABS: 500.0000 mg | ORAL_TABLET | Freq: Four times a day (QID) | ORAL | 0 refills | Status: DC | PRN

## 2018-02-22 MED ORDER — HYDROCODONE-ACETAMINOPHEN 5-325 MG PO TABS: 1.0000 | ORAL_TABLET | ORAL | 0 refills | Status: DC | PRN

## 2018-02-22 MED ORDER — METHYLPREDNISOLONE ACETATE 40 MG/ML IJ SUSP
40.0000 mg | INTRAMUSCULAR | Status: AC | PRN
  Administered 2018-02-22: 40 mg via INTRA_ARTICULAR

## 2018-02-22 MED ORDER — TRAMADOL HCL 50 MG PO TABS: 50.0000 mg | ORAL_TABLET | Freq: Four times a day (QID) | ORAL | 0 refills | Status: DC | PRN

## 2018-02-22 MED ORDER — LIDOCAINE HCL 1 % IJ SOLN
3.0000 mL | INTRAMUSCULAR | Status: AC | PRN
  Administered 2018-02-22: 3 mL

## 2018-02-22 NOTE — Progress Notes (Signed)
Office Visit Note   Patient: William Wells           Date of Birth: 05/12/1934           MRN: [id number] Visit Date: 02/22/2018              Requested by: Sinda Du, [address], [place] [id number] PCP: Sinda Du, MD   Assessment & Plan: Visit Diagnoses:  1. Sciatica, left side   2. Pain in left hip     Plan: Due to the severity of this pain at this point I do feel an urgent CT of his lumbar spine and his left hip are warranted can better get an idea of whether or not we can let him walk on the given his significant fall risk in fact this is not getting better in spite of conservative treatment.  I did provide a steroid injection of the trochanteric area and this did not give him any relief at all.  We will get a CT scan set up and see him back as soon as we have the results of these.  I do feel this is obvious if is worsening any way needs to go to the emergency room and get an MRI of his lumbar spine in the emergency room versus his left hip.  Follow-Up Instructions: Return in about 1 week (around 03/01/2018).   Orders:  Orders Placed This Encounter  Procedures  . Large Joint Inj   Meds ordered this encounter  Medications  . HYDROcodone-acetaminophen (NORCO/VICODIN) 5-325 MG tablet    Sig: Take 1-2 tablets by mouth every 4 (four) hours as needed.    Dispense:  40 tablet    Refill:  0  . DISCONTD: methocarbamol (ROBAXIN) 500 MG tablet    Sig: Take 1 tablet (500 mg total) by mouth every 6 (six) hours as needed for muscle spasms.    Dispense:  40 tablet    Refill:  0  . methocarbamol (ROBAXIN) 500 MG tablet    Sig: Take 1 tablet (500 mg total) by mouth every 6 (six) hours as needed for muscle spasms.    Dispense:  40 tablet    Refill:  0  . traMADol (ULTRAM) 50 MG tablet    Sig: Take 1-2 tablets (50-100 mg total) by mouth every 6 (six) hours as needed.    Dispense:  40 tablet    Refill:  0      Procedures: Large Joint Inj: L  greater trochanter on 02/22/2018 2:20 PM Indications: pain and diagnostic evaluation Details: 22 G 1.5 in needle, lateral approach  Arthrogram: No  Medications: 3 mL lidocaine 1 %; 40 mg methylPREDNISolone acetate 40 MG/ML Outcome: tolerated well, no immediate complications Procedure, treatment alternatives, risks and benefits explained, specific risks discussed. Consent was given by the patient. Immediately prior to procedure a time out was called to verify the correct patient, procedure, equipment, support staff and site/side marked as required. Patient was prepped and draped in the usual sterile fashion.       Clinical Data: No additional findings.   Subjective: Chief Complaint  Patient presents with  . Left Hip - Pain  At this point patient is having severe debilitating left lower extremity pain and radicular symptoms.  I felt that he urgently need an MRI of his lumbar spine but this was denied from his insurance company.  He comes in today mainly on crutches.  He is having worsening pain and inability  to ambulate.  HPI  Review of Systems He denies any headache, chest pain, shortness of breath, fever, chills, nausea, vomiting.  Objective: Vital Signs: There were no vitals taken for this visit.  Physical Exam He is alert and oriented x3 and in no acute distress but obvious discomfort Ortho Exam Examination of the left lower extremity shows pain from the hip to the knee as well as in the low back and radiating past the knee as well.  I can move his hip and knee around easily but he is having severe radiating pain which is hard to pinpoint where the pain is coming from. Specialty Comments:  No specialty comments available.  Imaging: No results found.   PMFS History: Patient Active Problem List   Diagnosis Date Noted  . Sciatica, left side 02/17/2018   Past Medical History:  Diagnosis Date  . Acid reflux disease   . Diabetes mellitus   . High cholesterol       Family History  Problem Relation Age of Onset  . Diabetes Mother   . Cancer Father   . Cancer Sister   . Diabetes Brother   . Cancer Brother     Past Surgical History:  Procedure Laterality Date  . APPENDECTOMY    . EYE SURGERY    . TONSILLECTOMY     Social History   Occupational History  . Not on file  Tobacco Use  . Smoking status: Former Research scientist (life sciences)  . Smokeless tobacco: Never Used  Substance and Sexual Activity  . Alcohol use: No  . Drug use: No  . Sexual activity: Not on file

## 2018-02-24 ENCOUNTER — Telehealth (INDEPENDENT_AMBULATORY_CARE_PROVIDER_SITE_OTHER): Payer: Self-pay | Admitting: Orthopaedic Surgery

## 2018-02-24 ENCOUNTER — Inpatient Hospital Stay: Admission: RE | Admit: 2018-02-24 | Payer: Medicare HMO | Source: Ambulatory Visit

## 2018-02-24 NOTE — Telephone Encounter (Signed)
Can you followup with patient?  It is about his CT orders -thanks

## 2018-02-24 NOTE — Telephone Encounter (Signed)
Patient called asked for a call back concerning you helping him with something. Patient would not give more information. The number to contact patient is [phone]

## 2018-02-25 ENCOUNTER — Ambulatory Visit
Admission: RE | Admit: 2018-02-25 | Discharge: 2018-02-25 | Disposition: A | Discharge status: Home / Self Care | Payer: Medicare HMO | Source: Ambulatory Visit | Attending: Orthopaedic Surgery | Admitting: Orthopaedic Surgery

## 2018-02-25 DIAGNOSIS — M4807 Spinal stenosis, lumbosacral region: Secondary | ICD-10-CM

## 2018-02-25 DIAGNOSIS — S72002G Fracture of unspecified part of neck of left femur, subsequent encounter for closed fracture with delayed healing: Secondary | ICD-10-CM

## 2018-03-03 ENCOUNTER — Ambulatory Visit (INDEPENDENT_AMBULATORY_CARE_PROVIDER_SITE_OTHER): Payer: Medicare HMO | Admitting: Orthopaedic Surgery

## 2018-03-03 ENCOUNTER — Other Ambulatory Visit: Payer: Medicare HMO

## 2018-03-03 ENCOUNTER — Encounter (INDEPENDENT_AMBULATORY_CARE_PROVIDER_SITE_OTHER): Payer: Self-pay | Admitting: Orthopaedic Surgery

## 2018-03-03 ENCOUNTER — Other Ambulatory Visit (INDEPENDENT_AMBULATORY_CARE_PROVIDER_SITE_OTHER): Payer: Self-pay

## 2018-03-03 DIAGNOSIS — M5442 Lumbago with sciatica, left side: Secondary | ICD-10-CM

## 2018-03-03 DIAGNOSIS — M5432 Sciatica, left side: Secondary | ICD-10-CM | POA: Diagnosis not present

## 2018-03-03 NOTE — Progress Notes (Signed)
The patient is here for follow-up after having a CT scan of his left hip and pelvis as well as an MRI of his lumbar spine.  He has had debilitating pain involving his sciatic region all the way down his left leg with mainly the left thigh after working in the yard.  He is 84 years old.  This came on all of a sudden and got to where he said he can even put weight on his leg.  On my office exam I can compress his hip completely and put his hip through full range of motion as well as stress the knee and thigh and this does not cause any significant problems.  He does have a positive straight leg raise and subjective numbness in his foot.  The CT scan did not show anything fractured in the pelvis or hip or proximal femur.  There is some moderate arthritic changes  of the left hip joint but this would not be causing him the pain that he is having and this is not correlate with his clinical exam.  However the MRI of his back does show significant change in the lumbar spine especially at L3-L4 with a combination of the protruded disks of the left side with facet hypertrophy causing severe foraminal stenosis.  The radiologist feels this could affect the L3 and L4 nerve roots.  I do feel that the next step would be an epidural steroid injection at the L3-L4 level to the left side to see how this helps from a symptomatic standpoint.  We will work on getting that injection set up as soon is possible.  All questions concerns were answered and addressed.

## 2018-03-04 ENCOUNTER — Other Ambulatory Visit (INDEPENDENT_AMBULATORY_CARE_PROVIDER_SITE_OTHER): Payer: Self-pay | Admitting: *Deleted

## 2018-03-04 DIAGNOSIS — M5442 Lumbago with sciatica, left side: Secondary | ICD-10-CM

## 2018-03-10 ENCOUNTER — Ambulatory Visit
Admission: RE | Admit: 2018-03-10 | Discharge: 2018-03-10 | Disposition: A | Discharge status: Home / Self Care | Payer: Medicare HMO | Source: Ambulatory Visit | Attending: Orthopaedic Surgery | Admitting: Orthopaedic Surgery

## 2018-03-10 DIAGNOSIS — M5442 Lumbago with sciatica, left side: Secondary | ICD-10-CM

## 2018-03-10 MED ORDER — IOPAMIDOL (ISOVUE-M 200) INJECTION 41%
1.0000 mL | Freq: Once | INTRAMUSCULAR | Status: AC
  Administered 2018-03-10: 1 mL via EPIDURAL

## 2018-03-10 MED ORDER — METHYLPREDNISOLONE ACETATE 40 MG/ML INJ SUSP (RADIOLOG
120.0000 mg | Freq: Once | INTRAMUSCULAR | Status: AC
  Administered 2018-03-10: 120 mg via EPIDURAL

## 2018-03-10 NOTE — Discharge Instructions (Signed)

## 2018-03-17 ENCOUNTER — Encounter (INDEPENDENT_AMBULATORY_CARE_PROVIDER_SITE_OTHER): Payer: Self-pay | Admitting: Physician Assistant

## 2018-03-17 ENCOUNTER — Ambulatory Visit (INDEPENDENT_AMBULATORY_CARE_PROVIDER_SITE_OTHER): Payer: Medicare HMO | Admitting: Physician Assistant

## 2018-03-17 DIAGNOSIS — M5432 Sciatica, left side: Secondary | ICD-10-CM | POA: Diagnosis not present

## 2018-03-17 MED ORDER — METHOCARBAMOL 500 MG PO TABS: 500.0000 mg | ORAL_TABLET | Freq: Four times a day (QID) | ORAL | 0 refills | Status: DC | PRN

## 2018-03-17 MED ORDER — TRAMADOL HCL 50 MG PO TABS: 50.0000 mg | ORAL_TABLET | Freq: Four times a day (QID) | ORAL | 0 refills | Status: DC | PRN

## 2018-03-17 NOTE — Progress Notes (Signed)
HPI: [name] returns today in follow-up of his low back pain and radicular symptoms left leg.Marland Kitchen  He underwent an interlaminar lumbar epidural injection on the left at L2-3.  He states that his pain is 75% better.  He states he is back to doing normal activities like feeding his chickens.  And is happy with the results.  He is using no cane or crutch.  He does use some tramadol occasionally for some discomfort he still has the upper leg.  Physical exam: General well-developed well-nourished male in no acute distress Lower extremities: Negative straight leg raise bilaterally.  He has good sensation bilateral feet light touch.  Left foot posterior tibial pulses intact.  Impression: Left-sided sciatica improved Plan: Refill on his tramadol is given today.  Follow with [place] on an as-needed basis If pain becomes worse or returns.  Questions were encouraged and answered with patient and his daughters present today.

## 2018-04-10 ENCOUNTER — Other Ambulatory Visit (INDEPENDENT_AMBULATORY_CARE_PROVIDER_SITE_OTHER): Payer: Self-pay | Admitting: Orthopaedic Surgery

## 2018-04-12 NOTE — Telephone Encounter (Signed)
Please advise 

## 2018-04-30 ENCOUNTER — Other Ambulatory Visit (INDEPENDENT_AMBULATORY_CARE_PROVIDER_SITE_OTHER): Payer: Self-pay | Admitting: Physician Assistant

## 2018-04-30 NOTE — Telephone Encounter (Signed)
Please advise 

## 2018-05-06 ENCOUNTER — Other Ambulatory Visit (INDEPENDENT_AMBULATORY_CARE_PROVIDER_SITE_OTHER): Payer: Self-pay | Admitting: Orthopaedic Surgery

## 2018-05-06 NOTE — Telephone Encounter (Signed)
See below, change requested

## 2018-10-19 LAB — CBC AND DIFFERENTIAL
HCT: 41 (ref 41–53)
Hemoglobin: 13.5 (ref 13.5–17.5)
Neutrophils Absolute: 4179
Platelets: 219 (ref 150–399)
WBC: 7

## 2018-10-19 LAB — HEPATIC FUNCTION PANEL
ALT: 21 (ref 10–40)
AST: 16 (ref 14–40)
Bilirubin, Total: 0.5

## 2018-10-19 LAB — BASIC METABOLIC PANEL
CO2: 25 — AB (ref 13–22)
Chloride: 102 (ref 99–108)
Potassium: 6.1 — AB (ref 3.4–5.3)
Sodium: 139 (ref 137–147)

## 2018-10-19 LAB — CBC: RBC: 4.52 (ref 3.87–5.11)

## 2018-10-19 LAB — COMPREHENSIVE METABOLIC PANEL
Albumin: 4.4 (ref 3.5–5.0)
Globulin: 2.9

## 2019-03-09 ENCOUNTER — Other Ambulatory Visit (HOSPITAL_COMMUNITY): Payer: Self-pay | Admitting: Pulmonary Disease

## 2019-03-09 DIAGNOSIS — R112 Nausea with vomiting, unspecified: Secondary | ICD-10-CM

## 2019-03-09 DIAGNOSIS — R109 Unspecified abdominal pain: Secondary | ICD-10-CM

## 2019-03-14 ENCOUNTER — Ambulatory Visit (HOSPITAL_COMMUNITY): Payer: Medicare HMO

## 2019-03-31 ENCOUNTER — Other Ambulatory Visit (HOSPITAL_COMMUNITY): Payer: Self-pay | Admitting: Pulmonary Disease

## 2019-03-31 ENCOUNTER — Other Ambulatory Visit: Payer: Self-pay

## 2019-03-31 ENCOUNTER — Ambulatory Visit (HOSPITAL_COMMUNITY)
Admission: RE | Admit: 2019-03-31 | Discharge: 2019-03-31 | Disposition: A | Discharge status: Home / Self Care | Payer: Medicare HMO | Source: Ambulatory Visit | Attending: Pulmonary Disease | Admitting: Pulmonary Disease

## 2019-03-31 DIAGNOSIS — R109 Unspecified abdominal pain: Secondary | ICD-10-CM | POA: Diagnosis present

## 2019-03-31 DIAGNOSIS — R112 Nausea with vomiting, unspecified: Secondary | ICD-10-CM | POA: Insufficient documentation

## 2019-04-15 LAB — BASIC METABOLIC PANEL
BUN: 25 — AB (ref 4–21)
Creatinine: 2.3 — AB (ref 0.6–1.3)
Glucose: 120

## 2019-04-15 LAB — COMPREHENSIVE METABOLIC PANEL: Calcium: 9.6 (ref 8.7–10.7)

## 2019-04-15 LAB — HEMOGLOBIN A1C: Hemoglobin A1C: 7.4

## 2019-07-15 DIAGNOSIS — I129 Hypertensive chronic kidney disease with stage 1 through stage 4 chronic kidney disease, or unspecified chronic kidney disease: Secondary | ICD-10-CM | POA: Insufficient documentation

## 2019-07-15 DIAGNOSIS — N1832 Chronic kidney disease, stage 3b: Secondary | ICD-10-CM | POA: Insufficient documentation

## 2019-07-15 DIAGNOSIS — E875 Hyperkalemia: Secondary | ICD-10-CM | POA: Insufficient documentation

## 2019-07-15 DIAGNOSIS — E1122 Type 2 diabetes mellitus with diabetic chronic kidney disease: Secondary | ICD-10-CM | POA: Insufficient documentation

## 2019-07-19 ENCOUNTER — Other Ambulatory Visit: Payer: Self-pay | Admitting: Nephrology

## 2019-07-19 ENCOUNTER — Other Ambulatory Visit (HOSPITAL_COMMUNITY): Payer: Self-pay | Admitting: Nephrology

## 2019-07-19 DIAGNOSIS — N1831 Chronic kidney disease, stage 3a: Secondary | ICD-10-CM

## 2019-07-25 ENCOUNTER — Ambulatory Visit (HOSPITAL_COMMUNITY)
Admission: RE | Admit: 2019-07-25 | Discharge: 2019-07-25 | Disposition: A | Discharge status: Home / Self Care | Payer: Medicare HMO | Source: Ambulatory Visit | Attending: Nephrology | Admitting: Nephrology

## 2019-07-25 ENCOUNTER — Other Ambulatory Visit: Payer: Self-pay

## 2019-07-25 DIAGNOSIS — N1831 Chronic kidney disease, stage 3a: Secondary | ICD-10-CM | POA: Insufficient documentation

## 2019-08-02 ENCOUNTER — Encounter: Payer: Self-pay | Admitting: Family Medicine

## 2019-09-19 ENCOUNTER — Other Ambulatory Visit: Payer: Self-pay

## 2019-09-19 ENCOUNTER — Emergency Department (HOSPITAL_COMMUNITY)
Admission: EM | Admit: 2019-09-19 | Discharge: 2019-09-19 | Disposition: A | Discharge status: Home / Self Care | Payer: Medicare HMO | Attending: Emergency Medicine | Admitting: Emergency Medicine

## 2019-09-19 ENCOUNTER — Emergency Department (HOSPITAL_COMMUNITY): Payer: Medicare HMO

## 2019-09-19 ENCOUNTER — Encounter (HOSPITAL_COMMUNITY): Payer: Self-pay | Admitting: Emergency Medicine

## 2019-09-19 DIAGNOSIS — Z79899 Other long term (current) drug therapy: Secondary | ICD-10-CM | POA: Diagnosis not present

## 2019-09-19 DIAGNOSIS — E119 Type 2 diabetes mellitus without complications: Secondary | ICD-10-CM | POA: Insufficient documentation

## 2019-09-19 DIAGNOSIS — Z7984 Long term (current) use of oral hypoglycemic drugs: Secondary | ICD-10-CM | POA: Insufficient documentation

## 2019-09-19 DIAGNOSIS — I10 Essential (primary) hypertension: Secondary | ICD-10-CM | POA: Insufficient documentation

## 2019-09-19 DIAGNOSIS — M25532 Pain in left wrist: Secondary | ICD-10-CM | POA: Diagnosis not present

## 2019-09-19 DIAGNOSIS — Z87891 Personal history of nicotine dependence: Secondary | ICD-10-CM | POA: Insufficient documentation

## 2019-09-19 HISTORY — DX: Disorder of kidney and ureter, unspecified: N28.9

## 2019-09-19 HISTORY — DX: Essential (primary) hypertension: I10

## 2019-09-19 LAB — CBC WITH DIFFERENTIAL/PLATELET
Abs Immature Granulocytes: 0.03 10*3/uL (ref 0.00–0.07)
Basophils Absolute: 0.1 10*3/uL (ref 0.0–0.1)
Basophils Relative: 1 %
Eosinophils Absolute: 0.3 10*3/uL (ref 0.0–0.5)
Eosinophils Relative: 3 %
HCT: 38.5 % — ABNORMAL LOW (ref 39.0–52.0)
Hemoglobin: 12.4 g/dL — ABNORMAL LOW (ref 13.0–17.0)
Immature Granulocytes: 0 %
Lymphocytes Relative: 19 %
Lymphs Abs: 2 10*3/uL (ref 0.7–4.0)
MCH: 29.9 pg (ref 26.0–34.0)
MCHC: 32.2 g/dL (ref 30.0–36.0)
MCV: 92.8 fL (ref 80.0–100.0)
Monocytes Absolute: 1.2 10*3/uL — ABNORMAL HIGH (ref 0.1–1.0)
Monocytes Relative: 11 %
Neutro Abs: 7 10*3/uL (ref 1.7–7.7)
Neutrophils Relative %: 66 %
Platelets: 186 10*3/uL (ref 150–400)
RBC: 4.15 MIL/uL — ABNORMAL LOW (ref 4.22–5.81)
RDW: 13.2 % (ref 11.5–15.5)
WBC: 10.7 10*3/uL — ABNORMAL HIGH (ref 4.0–10.5)
nRBC: 0 % (ref 0.0–0.2)

## 2019-09-19 LAB — C-REACTIVE PROTEIN: CRP: 2.5 mg/dL — ABNORMAL HIGH (ref <1.0)

## 2019-09-19 LAB — BASIC METABOLIC PANEL
Anion gap: 12 (ref 5–15)
BUN: 29 mg/dL — ABNORMAL HIGH (ref 8–23)
CO2: 25 mmol/L (ref 22–32)
Calcium: 9.7 mg/dL (ref 8.9–10.3)
Chloride: 101 mmol/L (ref 98–111)
Creatinine, Ser: 2.09 mg/dL — ABNORMAL HIGH (ref 0.61–1.24)
GFR calc Af Amer: 33 mL/min — ABNORMAL LOW (ref >60)
GFR calc non Af Amer: 28 mL/min — ABNORMAL LOW (ref >60)
Glucose, Bld: 96 mg/dL (ref 70–99)
Potassium: 4.3 mmol/L (ref 3.5–5.1)
Sodium: 138 mmol/L (ref 135–145)

## 2019-09-19 LAB — SEDIMENTATION RATE: Sed Rate: 45 mm/hr — ABNORMAL HIGH (ref 0–16)

## 2019-09-19 MED ORDER — INDOMETHACIN 25 MG PO CAPS: 25.0000 mg | ORAL_CAPSULE | Freq: Three times a day (TID) | ORAL | 0 refills | Status: DC | PRN

## 2019-09-19 MED ORDER — LIDOCAINE HCL (PF) 1 % IJ SOLN
5.0000 mL | Freq: Once | INTRAMUSCULAR | Status: DC
  Filled 2019-09-19: qty 6

## 2019-09-19 MED ORDER — HYDROCODONE-ACETAMINOPHEN 5-325 MG PO TABS: 1.0000 | ORAL_TABLET | Freq: Four times a day (QID) | ORAL | 0 refills | Status: DC | PRN

## 2019-09-19 NOTE — ED Provider Notes (Signed)
[facility] EMERGENCY DEPARTMENT Provider Note   CSN: [id number] Arrival date & time: 09/19/19  1543     History Chief Complaint  Patient presents with  . Hand Pain    William Wells is a 85 y.o. male.  Patient is an 85 year old male with past medical history of diabetes, hypertension, GERD, renal insufficiency.  He presents today for evaluation of left wrist pain.  This began several days ago in the absence of any injury or trauma.  He describes swelling and pain with range of motion and movement.  He denies any fevers or chills.  He denies any specific injury or trauma.  He was seen at urgent care, then referred here for rule out of septic joint.  Patient denies any history of gout.  The history is provided by the patient.  Hand Pain This is a new problem. The problem occurs constantly. The problem has been gradually worsening. Exacerbated by: Bending the wrist, movement, and palpation. Nothing relieves the symptoms.       Past Medical History:  Diagnosis Date  . Acid reflux disease   . Diabetes mellitus   . High cholesterol   . Hypertension   . Renal disorder    renal insufficiency    Patient Active Problem List   Diagnosis Date Noted  . Sciatica, left side 02/17/2018    Past Surgical History:  Procedure Laterality Date  . APPENDECTOMY    . EYE SURGERY    . TONSILLECTOMY         Family History  Problem Relation Age of Onset  . Diabetes Mother   . Cancer Father   . Cancer Sister   . Diabetes Brother   . Cancer Brother     Social History   Tobacco Use  . Smoking status: Former Research scientist (life sciences)  . Smokeless tobacco: Never Used  Substance Use Topics  . Alcohol use: No  . Drug use: No    Home Medications Prior to Admission medications   Medication Sig Start Date End Date Taking? Authorizing Provider  gabapentin (NEURONTIN) 300 MG capsule TAKE 1 CAPSULE BY MOUTH EVERYDAY AT BEDTIME 05/06/18   Mcarthur Rossetti, MD  HYDROcodone-acetaminophen  (NORCO/VICODIN) 5-325 MG tablet Take 1-2 tablets by mouth every 4 (four) hours as needed. 02/22/18   Mcarthur Rossetti, MD  metFORMIN (GLUCOPHAGE) 500 MG tablet Take 1,000 mg by mouth 2 (two) times daily.     [provider]  methocarbamol (ROBAXIN) 500 MG tablet Take 1 tablet (500 mg total) by mouth every 6 (six) hours as needed for muscle spasms. 03/17/18   Pete Pelt, PA-C  naproxen (NAPROSYN) 500 MG tablet Take 500 mg by mouth 4 (four) times daily.    [provider]  omeprazole (PRILOSEC) 20 MG capsule Take 20 mg by mouth daily.    [provider]  simvastatin (ZOCOR) 40 MG tablet Take 40 mg by mouth every evening.    [provider]  traMADol (ULTRAM) 50 MG tablet TAKE 1 TO 2 TABLETS BY MOUTH EVERY 6 HOURS AS NEEDED FOR PAIN 05/03/18   Pete Pelt, PA-C    Allergies    Patient has no known allergies.  Review of Systems   Review of Systems  All other systems reviewed and are negative.   Physical Exam Updated Vital Signs BP (!) 150/87 (BP Location: Right Arm)   Pulse 89   Temp 98.2 F (36.8 C) (Oral)   Resp 18   Ht 5\' 8"  (1.727 m)  Wt 82.6 kg   SpO2 99%   BMI 27.67 kg/m   Physical Exam Vitals and nursing note reviewed.  Constitutional:      General: He is not in acute distress.    Appearance: He is well-developed. He is not diaphoretic.  HENT:     Head: Normocephalic and atraumatic.  Musculoskeletal:        General: Normal range of motion.     Cervical back: Normal range of motion and neck supple.     Comments: The left wrist has mild erythema over the area of the distal radius.  There is tenderness to palpation and pain with range of motion.  Skin:    General: Skin is warm and dry.  Neurological:     Mental Status: He is alert and oriented to person, place, and time.     Coordination: Coordination normal.     ED Results / Procedures / Treatments   Labs (all labs ordered are listed, but only abnormal results are  displayed) Labs Reviewed  CBC WITH DIFFERENTIAL/PLATELET  BASIC METABOLIC PANEL  SEDIMENTATION RATE  C-REACTIVE PROTEIN    EKG None  Radiology No results found.  Procedures Procedures (including critical care time)  Medications Ordered in ED Medications  lidocaine (PF) (XYLOCAINE) 1 % injection 5 mL (has no administration in time range)    ED Course  I have reviewed the triage vital signs and the nursing notes.  Pertinent labs & imaging results that were available during my care of the patient were reviewed by me and considered in my medical decision making (see chart for details).  Patient presents here with complaints of pain in his left wrist that is nontraumatic.  He was sent here from urgent care to rule out septic joint.  Patient does have pain with range of motion and there is mild erythema over the distal radius.  I made an attempt to aspirate joint fluid, however was unsuccessful.  His work-up shows an unremarkable x-ray with no evidence for joint effusion or fracture.  Laboratory studies reveal a mild white count of 10.7, sed rate of 45, and CRP of 2.5.  I suspect this represents gout rather than a true septic joint.  Patient is afebrile and denies any recent illness.  I have discussed the clinical situation with Dr. Grandville Silos from hand surgery.  We have agreed that a course of indomethacin and pain medication is reasonable.  If the patient worsens or does not improve he is to return to the ER to be reevaluated.  MDM Rules/Calculators/A&P   Final Clinical Impression(s) / ED Diagnoses Final diagnoses:  None    Rx / DC Orders ED Discharge Orders    None       Veryl Speak, MD 09/19/19 1920

## 2019-09-19 NOTE — Discharge Instructions (Addendum)
Begin taking indomethacin as prescribed.  Take hydrocodone as prescribed as needed for pain.  Return to the emergency department if symptoms are not improving in the next 2 to 3 days.

## 2019-09-19 NOTE — ED Triage Notes (Signed)
Pt reports left hand/wrist pain x 3 days with no injury. Went to UC and referred here for r/o septic joint. Pt denies fever.

## 2019-12-19 ENCOUNTER — Ambulatory Visit: Payer: Medicare HMO | Admitting: Family Medicine

## 2020-03-27 ENCOUNTER — Other Ambulatory Visit: Payer: Self-pay

## 2020-03-27 ENCOUNTER — Encounter (HOSPITAL_COMMUNITY): Payer: Self-pay

## 2020-03-28 ENCOUNTER — Inpatient Hospital Stay (HOSPITAL_COMMUNITY): Payer: Medicare HMO | Attending: Oncology | Admitting: Oncology

## 2020-03-28 ENCOUNTER — Encounter (HOSPITAL_COMMUNITY): Payer: Self-pay | Admitting: Oncology

## 2020-03-28 ENCOUNTER — Inpatient Hospital Stay (HOSPITAL_COMMUNITY): Payer: Medicare HMO

## 2020-03-28 VITALS — BP 138/78 | HR 72 | Temp 98.0°F | Resp 18 | Ht 67.0 in | Wt 165.6 lb

## 2020-03-28 DIAGNOSIS — Z7984 Long term (current) use of oral hypoglycemic drugs: Secondary | ICD-10-CM | POA: Insufficient documentation

## 2020-03-28 DIAGNOSIS — E7800 Pure hypercholesterolemia, unspecified: Secondary | ICD-10-CM | POA: Insufficient documentation

## 2020-03-28 DIAGNOSIS — D649 Anemia, unspecified: Secondary | ICD-10-CM | POA: Insufficient documentation

## 2020-03-28 DIAGNOSIS — Z79899 Other long term (current) drug therapy: Secondary | ICD-10-CM | POA: Diagnosis not present

## 2020-03-28 DIAGNOSIS — Z87891 Personal history of nicotine dependence: Secondary | ICD-10-CM | POA: Diagnosis not present

## 2020-03-28 DIAGNOSIS — D631 Anemia in chronic kidney disease: Secondary | ICD-10-CM | POA: Diagnosis not present

## 2020-03-28 DIAGNOSIS — E1122 Type 2 diabetes mellitus with diabetic chronic kidney disease: Secondary | ICD-10-CM | POA: Insufficient documentation

## 2020-03-28 DIAGNOSIS — D472 Monoclonal gammopathy: Secondary | ICD-10-CM

## 2020-03-28 DIAGNOSIS — I129 Hypertensive chronic kidney disease with stage 1 through stage 4 chronic kidney disease, or unspecified chronic kidney disease: Secondary | ICD-10-CM | POA: Insufficient documentation

## 2020-03-28 DIAGNOSIS — K219 Gastro-esophageal reflux disease without esophagitis: Secondary | ICD-10-CM | POA: Diagnosis not present

## 2020-03-28 DIAGNOSIS — N184 Chronic kidney disease, stage 4 (severe): Secondary | ICD-10-CM | POA: Insufficient documentation

## 2020-03-28 HISTORY — DX: Monoclonal gammopathy: D47.2

## 2020-03-28 HISTORY — DX: Anemia, unspecified: D64.9

## 2020-03-28 LAB — COMPREHENSIVE METABOLIC PANEL
ALT: 24 U/L (ref 0–44)
AST: 19 U/L (ref 15–41)
Albumin: 4.3 g/dL (ref 3.5–5.0)
Alkaline Phosphatase: 49 U/L (ref 38–126)
Anion gap: 9 (ref 5–15)
BUN: 24 mg/dL — ABNORMAL HIGH (ref 8–23)
CO2: 27 mmol/L (ref 22–32)
Calcium: 9.7 mg/dL (ref 8.9–10.3)
Chloride: 103 mmol/L (ref 98–111)
Creatinine, Ser: 1.95 mg/dL — ABNORMAL HIGH (ref 0.61–1.24)
GFR calc Af Amer: 36 mL/min — ABNORMAL LOW (ref >60)
GFR calc non Af Amer: 31 mL/min — ABNORMAL LOW (ref >60)
Glucose, Bld: 126 mg/dL — ABNORMAL HIGH (ref 70–99)
Potassium: 4.7 mmol/L (ref 3.5–5.1)
Sodium: 139 mmol/L (ref 135–145)
Total Bilirubin: 0.6 mg/dL (ref 0.3–1.2)
Total Protein: 7.9 g/dL (ref 6.5–8.1)

## 2020-03-28 LAB — CBC WITH DIFFERENTIAL/PLATELET
Abs Immature Granulocytes: 0.02 10*3/uL (ref 0.00–0.07)
Basophils Absolute: 0.1 10*3/uL (ref 0.0–0.1)
Basophils Relative: 1 %
Eosinophils Absolute: 0.3 10*3/uL (ref 0.0–0.5)
Eosinophils Relative: 5 %
HCT: 41 % (ref 39.0–52.0)
Hemoglobin: 13.1 g/dL (ref 13.0–17.0)
Immature Granulocytes: 0 %
Lymphocytes Relative: 25 %
Lymphs Abs: 1.8 10*3/uL (ref 0.7–4.0)
MCH: 29.2 pg (ref 26.0–34.0)
MCHC: 32 g/dL (ref 30.0–36.0)
MCV: 91.5 fL (ref 80.0–100.0)
Monocytes Absolute: 0.6 10*3/uL (ref 0.1–1.0)
Monocytes Relative: 8 %
Neutro Abs: 4.4 10*3/uL (ref 1.7–7.7)
Neutrophils Relative %: 61 %
Platelets: 177 10*3/uL (ref 150–400)
RBC: 4.48 MIL/uL (ref 4.22–5.81)
RDW: 14.5 % (ref 11.5–15.5)
WBC: 7.2 10*3/uL (ref 4.0–10.5)
nRBC: 0 % (ref 0.0–0.2)

## 2020-03-28 LAB — LACTATE DEHYDROGENASE: LDH: 105 U/L (ref 98–192)

## 2020-03-28 NOTE — Patient Instructions (Addendum)
William Wells at [facility] Discharge Instructions  You were seen today in person by Kirby Crigler, PA and virtually you saw Dr. Narda Rutherford.  We talked with you today about how you have been feeling and about your medical history.  He talked with you about your diagnosis and we are going to do further workup to further evaluate.    We are going to draw labs today.  We are also going to ask you to do a 24 hour urine.  You will urinate in the commode first thing one morning and then start collecting the urine until your first urination the next morning. You will need to keep the urine in the fridge until you bring it back to [place].  You should collect your urine on Sunday - Thursday.  Don't do it over the weekend, as we are not open to take it over the weekend.    You will need a bone scan.  It is a full body x-ray that will show [place] any abnormalities.    We will see you back in 4 weeks to review all of these results.    Thank you for choosing [facility] to provide your oncology and hematology care.  To afford each patient quality time with our provider, please arrive at least 15 minutes before your scheduled appointment time.   If you have a lab appointment with the [facility] please come in thru the Main Entrance and check in at the main information desk.  You need to re-schedule your appointment should you arrive 10 or more minutes late.  We strive to give you quality time with our providers, and arriving late affects you and other patients whose appointments are after yours.  Also, if you no show three or more times for appointments you may be dismissed from the clinic at the providers discretion.     Again, thank you for choosing [facility].  Our hope is that these requests will decrease the amount of time that you wait before being seen by our physicians.        _____________________________________________________________  Should you have questions after your visit to [facility], please contact our office at [phone] between the hours of 8:00 a.m. and 4:30 p.m.  Voicemails left after 4:00 p.m. will not be returned until the following business day.  For prescription refill requests, have your pharmacy contact our office and allow 72 hours.    Due to Covid, you will need to wear a mask upon entering the hospital. If you do not have a mask, a mask will be given to you at the Main Entrance upon arrival. For doctor visits, patients may have 1 support person with them. For treatment visits, patients can not have anyone with them due to social distancing guidelines and our immunocompromised population.

## 2020-03-28 NOTE — Progress Notes (Signed)
[facility] Hematology/Oncology Consultation   Name: William Wells      MRN: [id number]    Location: Room/bed info not found  Date: 03/28/2020 Time:4:55 PM   REFERRING PHYSICIAN:  Ulice Bold, MD- nephrology  REASON FOR CONSULT:  MGUS   DIAGNOSIS:  MGUS  HISTORY OF PRESENT ILLNESS:   William Wells is a 86 y.o. male with a medical history significant for stage IV chronic kidney disease since 2019 being followed and managed by nephrology, DM with patient reported Hgb A1c of 7.2% most recently, non-nephrotic range proteinuria, HTN well controlled, secondary hyperparathyroidism with intact PTH level and at goal for stage IV CKD, and mild hypercalcemia without symptoms who is referred to the [facility] for evaluation of monoclonal gammopathy requiring additional evaluation.  He reports being sent to [place] because Dr. Theador Hawthorne "didn't like what he saw in blood work."  He reports feeling well without any new complaints of the last number of months.  He denies any new pain.  He does not have a history of chronic headaches and does have a history of gout requiring treatment intermittently.  He was recently put on antibiotic for a foot sore but otherwise no systemic antibiotic treatment for infection.  He denies any B symptoms including fevers, chills, night sweats, early satiety, and unintentional weight loss.  Appetite is good and weight is stable.  He denies any new cough or hemoptysis.  No abdominal bloating or discomfort.  He has a 100-pack-year smoking history quitting 22 years ago.  Unfortunately on 08/25/2019 he lost one of his sons to complications associated with COVID-19 infection.  He continues to grieve the loss of his son.  He has underlying diabetes mellitus and was on Metformin but this was discontinued due to his underlying kidney disease.  He was switched to glipizide 5 mg but had complications associated with hypoglycemia.  His glipizide dose was reduced to  2.5 mg and he only takes it when his glucose is greater than 160.   Review of Systems - General ROS: negative for - chills, fatigue, fever, hot flashes, malaise or night sweats Psychological ROS: negative for - anxiety, concentration difficulties or depression Ophthalmic ROS: negative for - blurry vision, decreased vision or double vision ENT ROS: negative for - epistaxis, oral lesions, sore throat or visual changes Allergy and Immunology ROS: negative Hematological and Lymphatic ROS: negative for - bleeding problems, blood clots or blood transfusions Endocrine ROS: negative for - hot flashes Respiratory ROS: negative for - cough, shortness of breath or wheezing Cardiovascular ROS: negative for - chest pain, dyspnea on exertion, irregular heartbeat or shortness of breath Gastrointestinal ROS: no abdominal pain, change in bowel habits, or black or bloody stools Genito-Urinary ROS: no dysuria, trouble voiding, or hematuria Musculoskeletal ROS: negative for - gait disturbance, muscle pain or muscular weakness Neurological ROS: no TIA or stroke symptoms Dermatological ROS: negative  PAST MEDICAL HISTORY:   Past Medical History:  Diagnosis Date  . Acid reflux disease   . Diabetes mellitus   . High cholesterol   . Hypertension   . Monoclonal gammopathy of unknown significance (MGUS) 03/28/2020  . Normocytic anemia 03/28/2020  . Renal disorder    renal insufficiency    ALLERGIES: No Known Allergies    MEDICATIONS: I have reviewed the patient's current medications.    Current Outpatient Medications on File Prior to Visit  Medication Sig Dispense Refill  . amLODipine (NORVASC) 10 MG tablet  Take by mouth.    . Butalbital-APAP-Caff-Cod 50-300-40-30 MG CAPS Take by mouth.    . Cholecalciferol 25 MCG (1000 UT) capsule Take by mouth.    Marland Kitchen glipiZIDE (GLUCOTROL) 5 MG tablet 1/2 tablet one time each day    . HYDROcodone-acetaminophen (NORCO) 5-325 MG tablet Take 1-2 tablets by mouth every 6  (six) hours as needed. (Patient not taking: Reported on 03/27/2020) 12 tablet 0  . levothyroxine (SYNTHROID) 50 MCG tablet Take by mouth.    . methocarbamol (ROBAXIN) 500 MG tablet Take 1 tablet (500 mg total) by mouth every 6 (six) hours as needed for muscle spasms. (Patient not taking: Reported on 03/27/2020) 40 tablet 0  . omeprazole (PRILOSEC) 20 MG capsule Take 20 mg by mouth daily.    . rosuvastatin (CRESTOR) 40 MG tablet Take by mouth.    . sodium bicarbonate 650 MG tablet Take by mouth.     No current facility-administered medications on file prior to visit.     PAST SURGICAL HISTORY Past Surgical History:  Procedure Laterality Date  . APPENDECTOMY    . EYE SURGERY    . TONSILLECTOMY      FAMILY HISTORY: Family History  Problem Relation Age of Onset  . Diabetes Mother   . Cancer Father   . Cancer Sister   . Diabetes Brother   . Cancer Brother   . Diabetes Brother   . Parkinson's disease Brother   . Diabetes Brother     SOCIAL HISTORY:  reports that he has quit smoking. He has never used smokeless tobacco. He reports that he does not drink alcohol and does not use drugs.  Social History   Socioeconomic History  . Marital status: Married    Spouse name: Not on file  . Number of children: 4  . Years of education: Not on file  . Highest education level: Not on file  Occupational History  . Occupation: RETIRED  Tobacco Use  . Smoking status: Former Research scientist (life sciences)  . Smokeless tobacco: Never Used  Substance and Sexual Activity  . Alcohol use: No  . Drug use: No  . Sexual activity: Not Currently  Other Topics Concern  . Not on file  Social History Narrative  . Not on file   Social Determinants of Health   Financial Resource Strain:   . Difficulty of Paying Living Expenses:   Food Insecurity:   . Worried About Charity fundraiser in the Last Year:   . Arboriculturist in the Last Year:   Transportation Needs:   . Film/video editor (Medical):   Marland Kitchen Lack of  Transportation (Non-Medical):   Physical Activity:   . Days of Exercise per Week:   . Minutes of Exercise per Session:   Stress:   . Feeling of Stress :   Social Connections:   . Frequency of Communication with Friends and Family:   . Frequency of Social Gatherings with Friends and Family:   . Attends Religious Services:   . Active Member of Clubs or Organizations:   . Attends Archivist Meetings:   Marland Kitchen Marital Status:     PERFORMANCE STATUS: The patient's performance status is 1 - Symptomatic but completely ambulatory  PHYSICAL EXAM: Most Recent Vital Signs: Blood pressure 138/78, pulse 72, temperature 98 F (36.7 C), temperature source Oral, resp. rate 18, height 5' 7"  (1.702 m), weight 165 lb 9.6 oz (75.1 kg), SpO2 98 %. BP 138/78 (BP Location: Right Arm, Patient Position: Sitting)  Pulse 72   Temp 98 F (36.7 C) (Oral)   Resp 18   Ht 5' 7"  (1.702 m)   Wt 165 lb 9.6 oz (75.1 kg)   SpO2 98%   BMI 25.94 kg/m   General Appearance:    Alert, cooperative, no distress, appears stated age  Head:    Normocephalic, without obvious abnormality, atraumatic  Eyes:    PERRL, conjunctiva/corneas clear, EOM's intact, both eyes       Ears:    Normal external ear, both ears  Nose:   Not examined, mask in place  Throat:   Not examined, mask in place  Neck:   Supple, symmetrical, trachea midline, no adenopathy;       thyroid:  No enlargement/tenderness/nodules  Back:     Symmetric, no curvature, ROM normal, no CVA tenderness  Lungs:     Clear to auscultation bilaterally, respirations unlabored  Chest wall:    No tenderness or deformity  Heart:    Regular rate and rhythm, S1 and S2 normal, no murmur, rub   or gallop  Abdomen:     Soft, non-tender, bowel sounds active all four quadrants        Extremities:   Extremities normal, atraumatic, no cyanosis or edema     Skin:   Skin color, texture, turgor normal, no rashes or lesions  Lymph nodes:   Cervical, supraclavicular, and  axillary nodes normal  Neurologic:   CNII-XII intact. Normal strength, sensation and reflexes      throughout    LABORATORY DATA:  No results found for this or any previous visit (from the past 48 hour(s)).    RADIOGRAPHY: No results found.     ASSESSMENT/PLAN:   1. Monoclonal gammopathy of unknown significance (MGUS) Reportedly polyclonal gammopathy likely of renal disease origin, but with a minimally elevated Calcium at 10.5 (normal albumin) and mild aneia at 12.7 g/dL with negative iron studies for iron deficiency.  CKD since 2019, stage IV.  Kappa/lambda ratio minimally elevated at 1.8.  Urine IFE demonstrating polyclonal gammopathy WITHOUT monoclonality or Bence Jone proteinuria.  Monoclonal gammopathy of undetermined significance (MGUS) is diagnosed in persons who meet the following three criteria:  ?Serum monoclonal protein (whether IgA, IgG, or IgM) <3 g/dL  ?Clonal bone marrow plasma cells <10 percent  ?Absence of lytic lesions, anemia, hypercalcemia, and renal insufficiency (end-organ damage) that can be attributed to the plasma cell proliferative disorder MGUS carries a risk of progression to MM of approximately 1 percent per year. Differentiation of MGUS from MM can be difficult and is primarily based on presence or absence of related end-organ damage. In comparison to overt MM or plasma cell leukemia, most patients with MGUS or SMM have few or no circulating monoclonal plasma cells. The use of other clinical and laboratory factors to differentiate between these two entities is discussed in more detail separately.   MGUS is defined as a serum monoclonal protein (M-Protein) at a concentration of <3 g/dL, a bone marrow with < 10% monoclonal plasma cells, and absence of end-organ damage (lytic bone lesions, anemia, hypercalcemia, renal insufficiency, hyperviscosity) related to the proliferative process.  Non-IgM MGUS (IgG, IgA, or IgD MGUS is the most common subtype of MGUS and has  the potential to progress to smoldering (asymptomatic) multiple myeloma and to symptomatic multiple myeloma.  Less frequently, these patients progress to AL amyloidosis, light chain deposition disease, or another lymphoproliferative disorder.  IgM MGUS accounts for approximately 15% of MGUS cases.  It is considered separately  from non-IgM MGUS because it has the potential to progress to smoldering Waldenstrom macroglobinemia and to symptomatic Waldenstrom macroglobulinemia and less often to lymphoma or AL amyloidosis.  Infrequently, IgM MGUS can progress to IgM multiple myeloma.Light chain MGUS (LC-MGUS) is a unique subtype of MGUS in which the secreted monoclonal protein lacks the immunoglobulin heavy chain component. LC-MGUS may progress to idiopathic Bence Jones proteinemia and to light chain multiple myeloma, AL amyloidosis, or light chain deposition disease.  Currently he does not meet criteria for MGRS (monoclonal gammopathy of renal significance).  We will complete the work-up for MGUS:  1. Labs- CBC diff, CMET, LDH, B2M, SPEP + IFE, light chain assay, 24 hour urine collection for protein and UIFE (Bence Jones protein) 2. Skeletal survey  Return in 4 weeks for follow-up.   2. Normocytic anemia Likely secondary to CKD.  Not a candidate for ESA therapy at this juncture.   ORDERS PLACED FOR THIS ENCOUNTER: Orders Placed This Encounter  Procedures  . DG Bone Survey Met  . Beta 2 microglobuline, serum  . 24 Hr Urn UIFE/Light Chains/TP QN  . Protein, urine, 24 hour  . CBC with Differential  . Comprehensive metabolic panel  . Lactate dehydrogenase  . Kappa/lambda light chains  . Multiple Myeloma Panel (SPEP&IFE w/QIG)    MEDICATIONS PRESCRIBED THIS ENCOUNTER: No orders of the defined types were placed in this encounter.   All questions were answered. The patient knows to call the clinic with any problems, questions or concerns. We can certainly see the patient much sooner if  necessary.  Patient discussed with Dr. Narda Rutherford and together we ascertained an up-to-date interval history, and examined the patient.  Dr. Narda Rutherford developed the patient's assessment and plan.  This was a shared visit-consultation.  His attestation will follow below.  This note is electronically signed by: Robynn Pane, PA-C 03/28/2020 4:55 PM   I have read the above note and spoken with the patient via a Webcam visit. I agree with the history, assessment, and plan as above.   Briefly Mr. Niemann is an 86 year old male with medical history significant for CKD and DM type II who presents for evaluation of a new diagnosis of MGUS. His labs showed calcium 10.5 (normal albumin) and mild anemia at 12.7 g/dL. Kappa/lambda ratio minimally elevated at 1.8.  Urine IFE demonstrating polyclonal gammopathy without M protein or Bence Jone proteinuria. On exam today Mr. Malina has no new or concerning symptoms.  Agree with the plan to obtain full MM labs including LDH, Beta 2 microglobulin, Metastatic bone survey, SPEP, UPEP and repeat CBC. Determination of whether to pursue a bone marrow biopsy will depend on the above studies. The patient is to follow up with Dr. Delton Coombes in 4 weeks time. In the interim I am happy to provide any support or answer any questions the patient may have.   Ledell Peoples, MD Department of Hematology/Oncology [place] [facility] Phone: [phone] Pager: [phone] Email: [email or web address]

## 2020-03-29 ENCOUNTER — Ambulatory Visit (HOSPITAL_COMMUNITY)
Admission: RE | Admit: 2020-03-29 | Discharge: 2020-03-29 | Disposition: A | Discharge status: Home / Self Care | Payer: Medicare HMO | Source: Ambulatory Visit | Attending: Hematology | Admitting: Hematology

## 2020-03-29 ENCOUNTER — Other Ambulatory Visit: Payer: Self-pay

## 2020-03-29 DIAGNOSIS — D472 Monoclonal gammopathy: Secondary | ICD-10-CM | POA: Diagnosis not present

## 2020-03-30 LAB — KAPPA/LAMBDA LIGHT CHAINS
Kappa free light chain: 48.5 mg/L — ABNORMAL HIGH (ref 3.3–19.4)
Kappa, lambda light chain ratio: 1.96 — ABNORMAL HIGH (ref 0.26–1.65)
Lambda free light chains: 24.8 mg/L (ref 5.7–26.3)

## 2020-03-30 LAB — BETA 2 MICROGLOBULIN, SERUM: Beta-2 Microglobulin: 3.2 mg/L — ABNORMAL HIGH (ref 0.6–2.4)

## 2020-04-02 ENCOUNTER — Other Ambulatory Visit (HOSPITAL_COMMUNITY): Payer: Self-pay | Admitting: *Deleted

## 2020-04-02 DIAGNOSIS — D472 Monoclonal gammopathy: Secondary | ICD-10-CM | POA: Diagnosis not present

## 2020-04-02 LAB — PROTEIN, URINE, 24 HOUR
Collection Interval-UPROT: 24 hours
Protein, 24H Urine: 572 mg/d — ABNORMAL HIGH (ref 50–100)
Protein, Urine: 13 mg/dL
Urine Total Volume-UPROT: 4400 mL

## 2020-04-02 LAB — MULTIPLE MYELOMA PANEL, SERUM
Albumin SerPl Elph-Mcnc: 3.8 g/dL (ref 2.9–4.4)
Albumin/Glob SerPl: 1.2 (ref 0.7–1.7)
Alpha 1: 0.3 g/dL (ref 0.0–0.4)
Alpha2 Glob SerPl Elph-Mcnc: 0.8 g/dL (ref 0.4–1.0)
B-Globulin SerPl Elph-Mcnc: 1 g/dL (ref 0.7–1.3)
Gamma Glob SerPl Elph-Mcnc: 1.4 g/dL (ref 0.4–1.8)
Globulin, Total: 3.4 g/dL (ref 2.2–3.9)
IgA: 143 mg/dL (ref 61–437)
IgG (Immunoglobin G), Serum: 1209 mg/dL (ref 603–1613)
IgM (Immunoglobulin M), Srm: 217 mg/dL — ABNORMAL HIGH (ref 15–143)
Total Protein ELP: 7.2 g/dL (ref 6.0–8.5)

## 2020-04-04 LAB — UIFE/LIGHT CHAINS/TP QN, 24-HR UR
FR KAPPA LT CH,24HR: 481.01 mg/24 hr
FR LAMBDA LT CH,24HR: 60.1 mg/24 hr
Free Kappa Lt Chains,Ur: 109.32 mg/L (ref 0.63–113.79)
Free Kappa/Lambda Ratio: 8 (ref 1.03–31.76)
Free Lambda Lt Chains,Ur: 13.66 mg/L — ABNORMAL HIGH (ref 0.47–11.77)
Total Protein, Urine-Ur/day: 286 mg/24 hr — ABNORMAL HIGH (ref 30–150)
Total Protein, Urine: 6.5 mg/dL
Total Volume: 4400

## 2020-04-05 ENCOUNTER — Emergency Department (HOSPITAL_COMMUNITY): Payer: Medicare HMO

## 2020-04-05 ENCOUNTER — Other Ambulatory Visit: Payer: Self-pay

## 2020-04-05 ENCOUNTER — Emergency Department (HOSPITAL_COMMUNITY)
Admission: EM | Admit: 2020-04-05 | Discharge: 2020-04-05 | Disposition: A | Discharge status: Home / Self Care | Payer: Medicare HMO | Attending: Emergency Medicine | Admitting: Emergency Medicine

## 2020-04-05 ENCOUNTER — Encounter (HOSPITAL_COMMUNITY): Payer: Self-pay | Admitting: Emergency Medicine

## 2020-04-05 ENCOUNTER — Other Ambulatory Visit (HOSPITAL_COMMUNITY): Payer: Self-pay | Admitting: *Deleted

## 2020-04-05 DIAGNOSIS — E119 Type 2 diabetes mellitus without complications: Secondary | ICD-10-CM | POA: Diagnosis not present

## 2020-04-05 DIAGNOSIS — M542 Cervicalgia: Secondary | ICD-10-CM | POA: Insufficient documentation

## 2020-04-05 DIAGNOSIS — I10 Essential (primary) hypertension: Secondary | ICD-10-CM | POA: Insufficient documentation

## 2020-04-05 DIAGNOSIS — R918 Other nonspecific abnormal finding of lung field: Secondary | ICD-10-CM

## 2020-04-05 DIAGNOSIS — Z87891 Personal history of nicotine dependence: Secondary | ICD-10-CM | POA: Insufficient documentation

## 2020-04-05 DIAGNOSIS — R911 Solitary pulmonary nodule: Secondary | ICD-10-CM | POA: Diagnosis not present

## 2020-04-05 DIAGNOSIS — R55 Syncope and collapse: Secondary | ICD-10-CM | POA: Diagnosis not present

## 2020-04-05 DIAGNOSIS — Z79899 Other long term (current) drug therapy: Secondary | ICD-10-CM | POA: Diagnosis not present

## 2020-04-05 LAB — CBC WITH DIFFERENTIAL/PLATELET
Abs Immature Granulocytes: 0.02 10*3/uL (ref 0.00–0.07)
Basophils Absolute: 0.1 10*3/uL (ref 0.0–0.1)
Basophils Relative: 1 %
Eosinophils Absolute: 0.2 10*3/uL (ref 0.0–0.5)
Eosinophils Relative: 2 %
HCT: 36.1 % — ABNORMAL LOW (ref 39.0–52.0)
Hemoglobin: 11.9 g/dL — ABNORMAL LOW (ref 13.0–17.0)
Immature Granulocytes: 0 %
Lymphocytes Relative: 11 %
Lymphs Abs: 1.1 10*3/uL (ref 0.7–4.0)
MCH: 29.8 pg (ref 26.0–34.0)
MCHC: 33 g/dL (ref 30.0–36.0)
MCV: 90.3 fL (ref 80.0–100.0)
Monocytes Absolute: 1 10*3/uL (ref 0.1–1.0)
Monocytes Relative: 10 %
Neutro Abs: 7.9 10*3/uL — ABNORMAL HIGH (ref 1.7–7.7)
Neutrophils Relative %: 76 %
Platelets: 160 10*3/uL (ref 150–400)
RBC: 4 MIL/uL — ABNORMAL LOW (ref 4.22–5.81)
RDW: 14.1 % (ref 11.5–15.5)
WBC: 10.3 10*3/uL (ref 4.0–10.5)
nRBC: 0 % (ref 0.0–0.2)

## 2020-04-05 LAB — BASIC METABOLIC PANEL
Anion gap: 11 (ref 5–15)
BUN: 29 mg/dL — ABNORMAL HIGH (ref 8–23)
CO2: 23 mmol/L (ref 22–32)
Calcium: 9.5 mg/dL (ref 8.9–10.3)
Chloride: 102 mmol/L (ref 98–111)
Creatinine, Ser: 1.98 mg/dL — ABNORMAL HIGH (ref 0.61–1.24)
GFR calc Af Amer: 35 mL/min — ABNORMAL LOW (ref >60)
GFR calc non Af Amer: 30 mL/min — ABNORMAL LOW (ref >60)
Glucose, Bld: 153 mg/dL — ABNORMAL HIGH (ref 70–99)
Potassium: 4 mmol/L (ref 3.5–5.1)
Sodium: 136 mmol/L (ref 135–145)

## 2020-04-05 MED ORDER — IOHEXOL 300 MG/ML  SOLN: 60.0000 mL | Freq: Once | INTRAMUSCULAR | Status: DC | PRN

## 2020-04-05 MED ORDER — IOHEXOL 350 MG/ML SOLN
60.0000 mL | Freq: Once | INTRAVENOUS | Status: AC | PRN
  Administered 2020-04-05: 60 mL via INTRAVENOUS

## 2020-04-05 MED ORDER — HYDROCODONE-ACETAMINOPHEN 5-325 MG PO TABS: 1.0000 | ORAL_TABLET | Freq: Four times a day (QID) | ORAL | 0 refills | Status: DC | PRN

## 2020-04-05 MED ORDER — FENTANYL CITRATE (PF) 100 MCG/2ML IJ SOLN
50.0000 ug | Freq: Once | INTRAMUSCULAR | Status: AC
  Administered 2020-04-05: 50 ug via INTRAVENOUS
  Filled 2020-04-05: qty 2

## 2020-04-05 NOTE — ED Triage Notes (Signed)
Pt here with original c/o neck pain since yesterday. Pt states he then fell around 0300 this am into a wall and "thinks he blacked out".

## 2020-04-05 NOTE — ED Provider Notes (Signed)
Care transferred to me.  CT scan reviewed and shows no obvious arterial dissection or aneurysm.  Patient's CTA shows a more concerned that this nodule is a Production designer, theatre/television/film.  I discussed this with patient and son.  They will need to follow-up closely with outpatient oncology, who they have already seen for something else.  Will need PET and CT scan.   Sherwood Gambler, MD 04/05/20 [id number]

## 2020-04-05 NOTE — Progress Notes (Signed)
Patient was seen urgently in the ER and had CT angio which showed lung mass concerning for primary lung malignancy.  Recommended PET scan for further evaluation.  I have arranged this and our schedulers will call patient for follow up appts.

## 2020-04-05 NOTE — ED Provider Notes (Signed)
[facility] EMERGENCY DEPARTMENT Provider Note   CSN: [id number] Arrival date & time: 04/05/20  2831     History Chief Complaint  Patient presents with   William Wells is a 86 y.o. male.  The history is provided by the patient.  Loss of Consciousness Episode history:  Single Most recent episode:  Today Duration: Unknown. Timing:  Constant Progression:  Improving Chronicity:  New Relieved by:  Nothing Worsened by:  Posture Associated symptoms: no chest pain, no difficulty breathing, no shortness of breath and no weakness   Patient reports over 24 hrs ago he woke up w/ right-sided neck pain.  Denies any recent trauma.  He reports distant history of neck surgery, but is not had any pain recently.  He felt that it may be a muscle spasm.  He denies any new arm or leg weakness.  No incontinence. He reports he took a Robaxin approximately 5 PM.  Due to difficulty sleeping and getting comfortable, he took another Robaxin at approximately 2 AM.  At 3 AM he stood up to go to the restroom when he felt lightheaded, and soon after had a syncopal episode.  He is unsure how long he was unconscious.  He now has a headache and increasing neck pain.  He reports at times the neck pain radiates from the back of his neck into back of his head.    Past Medical History:  Diagnosis Date   Acid reflux disease    Diabetes mellitus    High cholesterol    Hypertension    Monoclonal gammopathy of unknown significance (MGUS) 03/28/2020   Normocytic anemia 03/28/2020   Renal disorder    renal insufficiency    Patient Active Problem List   Diagnosis Date Noted   Monoclonal gammopathy of unknown significance (MGUS) 03/28/2020   Normocytic anemia 03/28/2020   Sciatica, left side 02/17/2018    Past Surgical History:  Procedure Laterality Date   APPENDECTOMY     EYE SURGERY     TONSILLECTOMY         Family History  Problem Relation Age of Onset   Diabetes Mother     Cancer Father    Cancer Sister    Diabetes Brother    Cancer Brother    Diabetes Brother    Parkinson's disease Brother    Diabetes Brother     Social History   Tobacco Use   Smoking status: Former Smoker   Smokeless tobacco: Never Used  Substance Use Topics   Alcohol use: No   Drug use: No    Home Medications Prior to Admission medications   Medication Sig Start Date End Date Taking? Authorizing Provider  amLODipine (NORVASC) 10 MG tablet Take by mouth. 07/28/19 07/27/20  [provider]  Butalbital-APAP-Caff-Cod 50-300-40-30 MG CAPS Take by mouth.    [provider]  Cholecalciferol 25 MCG (1000 UT) capsule Take by mouth.    [provider]  glipiZIDE (GLUCOTROL) 5 MG tablet 1/2 tablet one time each day 11/04/19   [provider]  HYDROcodone-acetaminophen (NORCO) 5-325 MG tablet Take 1-2 tablets by mouth every 6 (six) hours as needed. Patient not taking: Reported on 03/27/2020 09/19/19   Veryl Speak, MD  levothyroxine (SYNTHROID) 50 MCG tablet Take by mouth.    [provider]  methocarbamol (ROBAXIN) 500 MG tablet Take 1 tablet (500 mg total) by mouth every 6 (six) hours as needed for muscle spasms. Patient not taking: Reported on 03/27/2020 03/17/18  Pete Pelt, PA-C  omeprazole (PRILOSEC) 20 MG capsule Take 20 mg by mouth daily.    [provider]  rosuvastatin (CRESTOR) 40 MG tablet Take by mouth.    [provider]  sodium bicarbonate 650 MG tablet Take by mouth. 11/04/19   [provider]    Allergies    Patient has no known allergies.  Review of Systems   Review of Systems  HENT: Negative for trouble swallowing.   Eyes: Negative for visual disturbance.  Respiratory: Negative for shortness of breath.   Cardiovascular: Positive for syncope. Negative for chest pain.  Gastrointestinal: Negative for abdominal pain.  Musculoskeletal: Positive for neck pain.  Neurological: Positive  for syncope. Negative for weakness and numbness.  All other systems reviewed and are negative.   Physical Exam Updated Vital Signs BP (!) 141/72    Pulse 79    Temp 98.1 F (36.7 C)    Resp 18    Ht 1.702 m (5\' 7" )    Wt 74.4 kg    SpO2 99%    BMI 25.69 kg/m   Physical Exam CONSTITUTIONAL: Elderly, no acute distress HEAD: Normocephalic/atraumatic EYES: EOMI/PERRL ENMT: Mucous membranes moist, no visible trauma NECK: No anterior neck tenderness.  No anterior neck bruising.  No stridor.  No bruits SPINE/BACK: Cervical spine tenderness, right paracervical tenderness.  No bruising.  No thoracic or lumbar tenderness CV: S1/S2 noted LUNGS: Lungs are clear to auscultation bilaterally, no apparent distress ABDOMEN: soft, nontender, no rebound or guarding, bowel sounds noted throughout abdomen GU:no cva tenderness NEURO: Pt is awake/alert/appropriate, moves all extremitiesx4.  No facial droop.  No arm or leg drift. Equal power (5/5) with hand grip, wrist flex/extension, elbow flex/extension, and equal power with shoulder abduction/adduction.  No focal sensory deficit to light touch is noted in either UE.   Equal biceps/brachioradialis reflex in bilateral UE EXTREMITIES: pulses normal/equal, full ROM, scattered abrasions to both legs, no signs of trauma.  Pelvis stable.  All other extremities/joints palpated/ranged and nontender SKIN: warm, color normal, no rash noted neck or scalp PSYCH: no abnormalities of mood noted, alert and oriented to situation  ED Results / Procedures / Treatments   Labs (all labs ordered are listed, but only abnormal results are displayed) Labs Reviewed  BASIC METABOLIC PANEL - Abnormal; Notable for the following components:      Result Value   Glucose, Bld 153 (*)    BUN 29 (*)    Creatinine, Ser 1.98 (*)    GFR calc non Af Amer 30 (*)    GFR calc Af Amer 35 (*)    All other components within normal limits  CBC WITH DIFFERENTIAL/PLATELET - Abnormal; Notable  for the following components:   RBC 4.00 (*)    Hemoglobin 11.9 (*)    HCT 36.1 (*)    Neutro Abs 7.9 (*)    All other components within normal limits    EKG EKG Interpretation  Date/Time:  Thursday April 05 2020 05:30:14 EDT Ventricular Rate:  84 PR Interval:    QRS Duration: 95 QT Interval:  391 QTC Calculation: 463 R Axis:   -3 Text Interpretation: Sinus rhythm Prolonged PR interval Confirmed by Ripley Fraise [phone]) on 04/05/2020 5:34:23 AM   Radiology CT Head Wo Contrast  Result Date: 04/05/2020 CLINICAL DATA:  [place]. Hit head. EXAM: CT HEAD WITHOUT CONTRAST CT CERVICAL SPINE WITHOUT CONTRAST TECHNIQUE: Multidetector CT imaging of the head and cervical spine was performed following the standard protocol without intravenous contrast.  Multiplanar CT image reconstructions of the cervical spine were also generated. COMPARISON:  Head CT 12/13/2013 FINDINGS: CT HEAD FINDINGS Brain: Stable age related cerebral atrophy, ventriculomegaly and periventricular white matter disease. No extra-axial fluid collections are identified. No CT findings for acute hemispheric infarction or intracranial hemorrhage. No mass lesions. The brainstem and cerebellum are normal. Vascular: Vascular calcifications but no aneurysm or hyperdense vessels. Skull: No skull fracture or bone lesions. Sinuses/Orbits: Scattered opacified ethmoid air cells. There is also moderate mucoperiosteal thickening involving both maxillary sinuses. The mastoid air cells and middle ear cavities are clear. The globes are intact. Other: No scalp lesions or hematoma. CT CERVICAL SPINE FINDINGS Alignment: Normal overall alignment. Skull base and vertebrae: No acute fractures identified. Evidence of prior surgery with wide decompressive laminectomy from C3-C6. Severe degenerative disc disease at C3-4 and C4-5 with partial interbody fusion changes. Soft tissues and spinal canal: No prevertebral fluid or swelling. No visible canal hematoma. Disc  levels: Very generous spinal canal. No significant spinal stenosis. Mild multilevel foraminal stenosis due to uncinate spurring and facet disease. Upper chest: Indeterminate 2.2 cm left apical lung lesion. This could be scarring change but could not exclude neoplasm. Recommend dedicated full chest CT for further evaluation. Underlying emphysematous changes are noted. Other: Scattered carotid artery calcifications. IMPRESSION: 1. Stable age related cerebral atrophy, ventriculomegaly and periventricular white matter disease. 2. No acute intracranial findings or skull fracture. 3. Degenerative cervical spondylosis with multilevel disc disease and facet disease but no acute cervical spine fracture. 4. Evidence of prior surgery with wide decompressive laminectomy from C3-C6. 5. **An incidental finding of potential clinical significance has been found. Indeterminate 2.2 cm left apical lung lesion. This could be scarring change but could not exclude neoplasm. Recommend dedicated full chest CT for further evaluation.** Electronically Signed   By: Marijo Sanes M.D.   On: 04/05/2020 06:03   CT Cervical Spine Wo Contrast  Result Date: 04/05/2020 CLINICAL DATA:  [place]. Hit head. EXAM: CT HEAD WITHOUT CONTRAST CT CERVICAL SPINE WITHOUT CONTRAST TECHNIQUE: Multidetector CT imaging of the head and cervical spine was performed following the standard protocol without intravenous contrast. Multiplanar CT image reconstructions of the cervical spine were also generated. COMPARISON:  Head CT 12/13/2013 FINDINGS: CT HEAD FINDINGS Brain: Stable age related cerebral atrophy, ventriculomegaly and periventricular white matter disease. No extra-axial fluid collections are identified. No CT findings for acute hemispheric infarction or intracranial hemorrhage. No mass lesions. The brainstem and cerebellum are normal. Vascular: Vascular calcifications but no aneurysm or hyperdense vessels. Skull: No skull fracture or bone lesions.  Sinuses/Orbits: Scattered opacified ethmoid air cells. There is also moderate mucoperiosteal thickening involving both maxillary sinuses. The mastoid air cells and middle ear cavities are clear. The globes are intact. Other: No scalp lesions or hematoma. CT CERVICAL SPINE FINDINGS Alignment: Normal overall alignment. Skull base and vertebrae: No acute fractures identified. Evidence of prior surgery with wide decompressive laminectomy from C3-C6. Severe degenerative disc disease at C3-4 and C4-5 with partial interbody fusion changes. Soft tissues and spinal canal: No prevertebral fluid or swelling. No visible canal hematoma. Disc levels: Very generous spinal canal. No significant spinal stenosis. Mild multilevel foraminal stenosis due to uncinate spurring and facet disease. Upper chest: Indeterminate 2.2 cm left apical lung lesion. This could be scarring change but could not exclude neoplasm. Recommend dedicated full chest CT for further evaluation. Underlying emphysematous changes are noted. Other: Scattered carotid artery calcifications. IMPRESSION: 1. Stable age related cerebral atrophy, ventriculomegaly and periventricular white matter disease.  2. No acute intracranial findings or skull fracture. 3. Degenerative cervical spondylosis with multilevel disc disease and facet disease but no acute cervical spine fracture. 4. Evidence of prior surgery with wide decompressive laminectomy from C3-C6. 5. **An incidental finding of potential clinical significance has been found. Indeterminate 2.2 cm left apical lung lesion. This could be scarring change but could not exclude neoplasm. Recommend dedicated full chest CT for further evaluation.** Electronically Signed   By: Marijo Sanes M.D.   On: 04/05/2020 06:03    Procedures .1-3 Lead EKG Interpretation Performed by: Ripley Fraise, MD Authorized by: Ripley Fraise, MD     Interpretation: normal     ECG rate:  78   ECG rate assessment: normal     Rhythm:  sinus rhythm     Ectopy: PVCs     Conduction: normal       Medications Ordered in ED Medications  fentaNYL (SUBLIMAZE) injection 50 mcg (has no administration in time range)    ED Course  I have reviewed the triage vital signs and the nursing notes.  Pertinent labs & imaging results that were available during my care of the patient were reviewed by me and considered in my medical decision making (see chart for details).    MDM Rules/Calculators/A&P                          5:27 AM Pt with h/o MGUS, DM presents for syncope.  He reports he had musculoskeletal neck pain in the preceding 24 hours and started taking Robaxin After waking up he had syncopal episode. Labs and imaging are pending this time Patient did have recent bone survey that did not reveal any signs of metastatic disease 7:14 AM Initial labs and imaging unrevealing. Patient still having neck pain.  No focal weakness. He reports the pain did start in his neck then moved into his head. Does not mention worse headache of life. However given that the pain started in his neck and he did have syncope, vascular pathology is a possibility, including carotid dissection vs  VBI Low suspicion for [facility] at this time. After further discussion, plan will be to obtain CT angio head and neck.  If these are unremarkable and patient is improved and no issues on monitor, he will be discharged home.  Short course of pain medicines been ordered.  Sign out to Dr. Regenia Skeeter  @ [employer]. Patient will need outpatient CT chest due to incidental finding in left lung.  This was discussed with patient and his family.  Information also sent to his physicians in oncology   This patient presents to the ED for concern of syncope, this involves an extensive number of treatment options, and is a complaint that carries with it a high risk of complications and morbidity.  The differential diagnosis includes dehydration, cardiac arrhythmia, medication  reaction, orthostatic hypotension   Lab Tests:   I Ordered, reviewed, and interpreted labs, which included electrolytes, complete blood count  Medicines ordered:   I ordered medication fentanyl  For pain   Imaging Studies ordered:   I ordered imaging studies which included CT head and C-spine   I independently visualized and interpreted imaging which showed no acute findings  Additional history obtained:   Additional history obtained from family member  Previous records obtained and reviewed    Reevaluation:  After the interventions stated above, I reevaluated the patient and found patient stable/unchanged    Final Clinical Impression(s) /  ED Diagnoses Final diagnoses:  Acute neck pain  Syncope and collapse  Lung nodule    Rx / DC Orders ED Discharge Orders         Ordered    HYDROcodone-acetaminophen (NORCO/VICODIN) 5-325 MG tablet  Every 6 hours PRN     Discontinue  Reprint     04/05/20 0705           Ripley Fraise, MD 04/05/20 [phone]

## 2020-04-05 NOTE — ED Notes (Signed)
Patient transported to CT 

## 2020-04-05 NOTE — Discharge Instructions (Addendum)
Your CT scan shows a mass in your left chest.  This is highly concerning for a cancerous lesion.  You need to have a an outpatient CT scan of your chest as well as a PET scan.  The oncology office can set this up and you should call their office today.

## 2020-04-09 ENCOUNTER — Other Ambulatory Visit: Payer: Self-pay

## 2020-04-09 ENCOUNTER — Ambulatory Visit (HOSPITAL_COMMUNITY)
Admission: RE | Admit: 2020-04-09 | Discharge: 2020-04-09 | Disposition: A | Discharge status: Home / Self Care | Payer: Medicare HMO | Source: Ambulatory Visit | Attending: Hematology | Admitting: Hematology

## 2020-04-09 DIAGNOSIS — R918 Other nonspecific abnormal finding of lung field: Secondary | ICD-10-CM

## 2020-04-09 MED ORDER — FLUDEOXYGLUCOSE F - 18 (FDG) INJECTION
11.2000 | Freq: Once | INTRAVENOUS | Status: AC | PRN
  Administered 2020-04-09: 11.2 via INTRAVENOUS

## 2020-04-11 ENCOUNTER — Other Ambulatory Visit: Payer: Self-pay

## 2020-04-11 ENCOUNTER — Inpatient Hospital Stay (HOSPITAL_COMMUNITY): Payer: Medicare HMO | Attending: Oncology | Admitting: Hematology

## 2020-04-11 VITALS — BP 135/72 | HR 82 | Temp 97.1°F | Resp 18 | Wt 164.0 lb

## 2020-04-11 DIAGNOSIS — D472 Monoclonal gammopathy: Secondary | ICD-10-CM | POA: Diagnosis not present

## 2020-04-11 DIAGNOSIS — Z87891 Personal history of nicotine dependence: Secondary | ICD-10-CM | POA: Diagnosis not present

## 2020-04-11 DIAGNOSIS — R918 Other nonspecific abnormal finding of lung field: Secondary | ICD-10-CM | POA: Diagnosis not present

## 2020-04-11 DIAGNOSIS — Z79899 Other long term (current) drug therapy: Secondary | ICD-10-CM | POA: Insufficient documentation

## 2020-04-11 NOTE — Progress Notes (Signed)
[place], [place] [address]   CLINIC:  Medical Oncology/Hematology  PCP:  Celene Squibb, MD [address] [place] [address]  [phone]  REASON FOR VISIT:  Follow-up for MGUS and left lung nodule  PRIOR THERAPY: None  CURRENT THERAPY: Under follow-up  INTERVAL HISTORY:  [name], a 86 y.o. male, returns for routine follow-up for his MGUS and lung nodule. William Wells was last seen on 03/28/2020 with Robynn Pane.  Today he is accompanied by his daughter. He is keeping up with his daily activities and getting his exercise that way. He has lost weight about 40 lbs in the last 6 months after his PCP gave him nutrition counseling.  He quit smoking in 1999, smoking 1 PPD for 46 years. He takes vitamin D 2,000 units daily. His father and brother had lung cancer, with mets to brain in his father.   REVIEW OF SYSTEMS:  Review of Systems  Constitutional: Positive for fatigue (mild). Negative for appetite change.  Gastrointestinal: Positive for constipation.  Neurological: Positive for headaches and numbness (hands).  All other systems reviewed and are negative.   PAST MEDICAL/SURGICAL HISTORY:  Past Medical History:  Diagnosis Date   Acid reflux disease    Diabetes mellitus    High cholesterol    Hypertension    Monoclonal gammopathy of unknown significance (MGUS) 03/28/2020   Normocytic anemia 03/28/2020   Renal disorder    renal insufficiency   Past Surgical History:  Procedure Laterality Date   APPENDECTOMY     EYE SURGERY     TONSILLECTOMY      SOCIAL HISTORY:  Social History   Socioeconomic History   Marital status: Married    Spouse name: Not on file   Number of children: 4   Years of education: Not on file   Highest education level: Not on file  Occupational History   Occupation: RETIRED  Tobacco Use   Smoking status: Former Smoker   Smokeless tobacco: Never Used  Substance and  Sexual Activity   Alcohol use: No   Drug use: No   Sexual activity: Not Currently  Other Topics Concern   Not on file  Social History Narrative   Not on file   Social Determinants of Health   Financial Resource Strain:    Difficulty of Paying Living Expenses:   Food Insecurity:    Worried About Charity fundraiser in the Last Year:    Arboriculturist in the Last Year:   Transportation Needs:    Film/video editor (Medical):    Lack of Transportation (Non-Medical):   Physical Activity:    Days of Exercise per Week:    Minutes of Exercise per Session:   Stress:    Feeling of Stress :   Social Connections:    Frequency of Communication with Friends and Family:    Frequency of Social Gatherings with Friends and Family:    Attends Religious Services:    Active Member of Clubs or Organizations:    Attends Music therapist:    Marital Status:   Intimate Partner Violence:    Fear of Current or Ex-Partner:    Emotionally Abused:    Physically Abused:    Sexually Abused:     FAMILY HISTORY:  Family History  Problem Relation Age of Onset   Diabetes Mother    Cancer Father    Cancer Sister    Diabetes  Brother    Cancer Brother    Diabetes Brother    Parkinson's disease Brother    Diabetes Brother     CURRENT MEDICATIONS:  Current Outpatient Medications  Medication Sig Dispense Refill   amLODipine (NORVASC) 10 MG tablet Take 10 mg by mouth daily.      Butalbital-APAP-Caff-Cod 50-300-40-30 MG CAPS Take 1 capsule by mouth daily.      Cholecalciferol 25 MCG (1000 UT) capsule Take by mouth.     HYDROcodone-acetaminophen (NORCO/VICODIN) 5-325 MG tablet Take 1 tablet by mouth every 6 (six) hours as needed. 5 tablet 0   Lancets (ONETOUCH DELICA PLUS [id number]) MISC 2 (two) times daily.     levothyroxine (SYNTHROID) 50 MCG tablet Take 50 mcg by mouth daily.      methocarbamol (ROBAXIN) 500 MG tablet Take 1 tablet (500 mg  total) by mouth every 6 (six) hours as needed for muscle spasms. 40 tablet 0   omeprazole (PRILOSEC) 20 MG capsule Take 20 mg by mouth daily.     ONETOUCH ULTRA test strip 2 (two) times daily.     rosuvastatin (CRESTOR) 40 MG tablet Take 40 mg by mouth daily.      sodium bicarbonate 650 MG tablet Take 650 mg by mouth daily.      glipiZIDE (GLUCOTROL XL) 2.5 MG 24 hr tablet Take by mouth. (Patient not taking: Reported on 04/11/2020)     No current facility-administered medications for this visit.    ALLERGIES:  No Known Allergies  PHYSICAL EXAM:  Performance status (ECOG): 1 - Symptomatic but completely ambulatory  Vitals:   04/11/20 1055  BP: 135/72  Pulse: 82  Resp: 18  Temp: (!) 97.1 F (36.2 C)  SpO2: 96%   Wt Readings from Last 3 Encounters:  04/11/20 74.4 kg (164 lb)  04/05/20 74.4 kg (164 lb)  03/28/20 75.1 kg (165 lb 9.6 oz)   Physical Exam Vitals reviewed.  Constitutional:      Appearance: Normal appearance.  Cardiovascular:     Rate and Rhythm: Normal rate and regular rhythm.     Heart sounds: Normal heart sounds.  Pulmonary:     Effort: Pulmonary effort is normal.     Breath sounds: Normal breath sounds.  Neurological:     Mental Status: He is alert and oriented to person, place, and time.  Psychiatric:        Mood and Affect: Mood normal.        Behavior: Behavior normal.     LABORATORY DATA:  I have reviewed the labs as listed.  CBC Latest Ref Rng & Units 04/05/2020 03/28/2020 09/19/2019  WBC 4.0 - 10.5 K/uL 10.3 7.2 10.7(H)  Hemoglobin 13.0 - 17.0 g/dL 11.9(L) 13.1 12.4(L)  Hematocrit 39 - 52 % 36.1(L) 41.0 38.5(L)  Platelets 150 - 400 K/uL 160 177 186   CMP Latest Ref Rng & Units 04/05/2020 03/28/2020 09/19/2019  Glucose 70 - 99 mg/dL 153(H) 126(H) 96  BUN 8 - 23 mg/dL 29(H) 24(H) 29(H)  Creatinine 0.61 - 1.24 mg/dL 1.98(H) 1.95(H) 2.09(H)  Sodium 135 - 145 mmol/L 136 139 138  Potassium 3.5 - 5.1 mmol/L 4.0 4.7 4.3  Chloride 98 - 111 mmol/L 102  103 101  CO2 22 - 32 mmol/L _0 Calcium 8.9 - 10.3 mg/dL 9.5 9.7 9.7  Total Protein 6.5 - 8.1 g/dL - 7.9 -  Total Bilirubin 0.3 - 1.2 mg/dL - 0.6 -  Alkaline Phos 38 - 126 U/L - 49 -  AST  15 - 41 U/L - 19 -  ALT 0 - 44 U/L - 24 -      Component Value Date/Time   RBC 4.00 (L) 04/05/2020 0546   MCV 90.3 04/05/2020 0546   MCH 29.8 04/05/2020 0546   MCHC 33.0 04/05/2020 0546   RDW 14.1 04/05/2020 0546   LYMPHSABS 1.1 04/05/2020 0546   MONOABS 1.0 04/05/2020 0546   EOSABS 0.2 04/05/2020 0546   BASOSABS 0.1 04/05/2020 0546   Lab Results  Component Value Date   TOTALPROTELP 7.2 03/28/2020    Lab Results  Component Value Date   KPAFRELGTCHN 48.5 (H) 03/28/2020   LAMBDASER 24.8 03/28/2020   KAPLAMBRATIO 8.00 04/02/2020   Lab Results  Component Value Date   LDH 105 03/28/2020    DIAGNOSTIC IMAGING:  I have independently reviewed the scans and discussed with the patient. CT Angio Head W or Wo Contrast  Result Date: 04/05/2020 CLINICAL DATA:  Neck pain, acute, no red flags. Headache, acute, normal neuro exam. Additional history provided: Right-sided neck pain for 1 day, patient reports falling at 1 a.m., possible syncope. EXAM: CT ANGIOGRAPHY HEAD AND NECK TECHNIQUE: Multidetector CT imaging of the head and neck was performed using the standard protocol during bolus administration of intravenous contrast. Multiplanar CT image reconstructions and MIPs were obtained to evaluate the vascular anatomy. Carotid stenosis measurements (when applicable) are obtained utilizing NASCET criteria, using the distal internal carotid diameter as the denominator. CONTRAST:  11m OMNIPAQUE IOHEXOL 350 MG/ML SOLN COMPARISON:  Same day CT cervical spine FINDINGS: CTA NECK FINDINGS Aortic arch: Common origin of the innominate and left common carotid arteries. Atherosclerotic plaque within the visualized aortic arch and proximal major branch vessels of the neck. No hemodynamically significant innominate  or proximal subclavian artery stenosis. Right carotid system: CCA and ICA patent within the neck without significant stenosis (50% or greater). Minimal calcified plaque at the right CCA origin. Mild soft plaque at the carotid bifurcation. Left carotid system: CCA and ICA patent within the neck without significant stenosis (50% or greater). Mild mixed plaque within the carotid bifurcation and proximal ICA. Vertebral arteries: Codominant and patent within the neck without stenosis. No significant atherosclerotic disease. Minimal nonstenotic calcified plaque at the origin of the left vertebral artery. Skeleton: Sequela prior multilevel posterior decompression within the cervical spine. Cervical spondylosis with multilevel disc space narrowing, posterior disc osteophytes, uncovertebral and facet hypertrophy. C4-C5 degenerative fusion. No acute bony abnormality or aggressive osseous lesion identified. Other neck: No neck mass or cervical lymphadenopathy. Thyroid unremarkable. Upper chest: There is a 2.4 cm opacity within the left lung apex which is suspicious for lung malignancy. (Series 7, image 289). Background emphysema emphysematous changes Review of the MIP images confirms the above findings CTA HEAD FINDINGS Anterior circulation: The intracranial internal carotid arteries are patent without significant stenosis. Minimal calcified plaque within the pre cavernous right ICA. The M1 middle cerebral arteries are patent without significant stenosis. No M2 proximal branch occlusion or high-grade proximal stenosis is identified. The anterior cerebral arteries are patent without significant proximal stenosis. No intracranial aneurysm is identified. Posterior circulation: The intracranial vertebral arteries are patent without significant stenosis, as is the basilar artery. The posterior cerebral arteries are patent without significant proximal stenosis. Posterior communicating arteries are hypoplastic or absent bilaterally.  Venous sinuses: Within limitations of contrast timing, no convincing thrombus. Anatomic variants: As described Review of the MIP images confirms the above findings IMPRESSION: CTA neck: 1. The common carotid, internal carotid and vertebral arteries are  patent within the neck without hemodynamically significant stenosis. Mild atherosclerotic disease within these vessels as described. No evidence of traumatic vascular injury. 2. 2.4 CM OPACITY WITHIN THE LEFT LUNG APEX. THIS IS HIGHLY CONCERNING FOR PRIMARY LUNG MALIGNANCY, ALTHOUGH COULD ALTERNATIVELY REFLECT PLEURAL-PARENCHYMAL SCARRING. PET-CT IS RECOMMENDED FOR FURTHER EVALUATION. CTA head: 1. No intracranial large vessel occlusion or proximal high-grade arterial stenosis. 2. Minimal calcified plaque within the pre cavernous right internal carotid artery. Electronically Signed   By: Kellie Simmering DO   On: 04/05/2020 08:52   CT Head Wo Contrast  Result Date: 04/05/2020 CLINICAL DATA:  [address]. Hit head. EXAM: CT HEAD WITHOUT CONTRAST CT CERVICAL SPINE WITHOUT CONTRAST TECHNIQUE: Multidetector CT imaging of the head and cervical spine was performed following the standard protocol without intravenous contrast. Multiplanar CT image reconstructions of the cervical spine were also generated. COMPARISON:  Head CT 12/13/2013 FINDINGS: CT HEAD FINDINGS Brain: Stable age related cerebral atrophy, ventriculomegaly and periventricular white matter disease. No extra-axial fluid collections are identified. No CT findings for acute hemispheric infarction or intracranial hemorrhage. No mass lesions. The brainstem and cerebellum are normal. Vascular: Vascular calcifications but no aneurysm or hyperdense vessels. Skull: No skull fracture or bone lesions. Sinuses/Orbits: Scattered opacified ethmoid air cells. There is also moderate mucoperiosteal thickening involving both maxillary sinuses. The mastoid air cells and middle ear cavities are clear. The globes are intact. Other: No  scalp lesions or hematoma. CT CERVICAL SPINE FINDINGS Alignment: Normal overall alignment. Skull base and vertebrae: No acute fractures identified. Evidence of prior surgery with wide decompressive laminectomy from C3-C6. Severe degenerative disc disease at C3-4 and C4-5 with partial interbody fusion changes. Soft tissues and spinal canal: No prevertebral fluid or swelling. No visible canal hematoma. Disc levels: Very generous spinal canal. No significant spinal stenosis. Mild multilevel foraminal stenosis due to uncinate spurring and facet disease. Upper chest: Indeterminate 2.2 cm left apical lung lesion. This could be scarring change but could not exclude neoplasm. Recommend dedicated full chest CT for further evaluation. Underlying emphysematous changes are noted. Other: Scattered carotid artery calcifications. IMPRESSION: 1. Stable age related cerebral atrophy, ventriculomegaly and periventricular white matter disease. 2. No acute intracranial findings or skull fracture. 3. Degenerative cervical spondylosis with multilevel disc disease and facet disease but no acute cervical spine fracture. 4. Evidence of prior surgery with wide decompressive laminectomy from C3-C6. 5. **An incidental finding of potential clinical significance has been found. Indeterminate 2.2 cm left apical lung lesion. This could be scarring change but could not exclude neoplasm. Recommend dedicated full chest CT for further evaluation.** Electronically Signed   By: Marijo Sanes M.D.   On: 04/05/2020 06:03   CT Angio Neck W and/or Wo Contrast  Result Date: 04/05/2020 CLINICAL DATA:  Neck pain, acute, no red flags. Headache, acute, normal neuro exam. Additional history provided: Right-sided neck pain for 1 day, patient reports falling at 1 a.m., possible syncope. EXAM: CT ANGIOGRAPHY HEAD AND NECK TECHNIQUE: Multidetector CT imaging of the head and neck was performed using the standard protocol during bolus administration of intravenous  contrast. Multiplanar CT image reconstructions and MIPs were obtained to evaluate the vascular anatomy. Carotid stenosis measurements (when applicable) are obtained utilizing NASCET criteria, using the distal internal carotid diameter as the denominator. CONTRAST:  32m OMNIPAQUE IOHEXOL 350 MG/ML SOLN COMPARISON:  Same day CT cervical spine FINDINGS: CTA NECK FINDINGS Aortic arch: Common origin of the innominate and left common carotid arteries. Atherosclerotic plaque within the visualized aortic arch and proximal major  branch vessels of the neck. No hemodynamically significant innominate or proximal subclavian artery stenosis. Right carotid system: CCA and ICA patent within the neck without significant stenosis (50% or greater). Minimal calcified plaque at the right CCA origin. Mild soft plaque at the carotid bifurcation. Left carotid system: CCA and ICA patent within the neck without significant stenosis (50% or greater). Mild mixed plaque within the carotid bifurcation and proximal ICA. Vertebral arteries: Codominant and patent within the neck without stenosis. No significant atherosclerotic disease. Minimal nonstenotic calcified plaque at the origin of the left vertebral artery. Skeleton: Sequela prior multilevel posterior decompression within the cervical spine. Cervical spondylosis with multilevel disc space narrowing, posterior disc osteophytes, uncovertebral and facet hypertrophy. C4-C5 degenerative fusion. No acute bony abnormality or aggressive osseous lesion identified. Other neck: No neck mass or cervical lymphadenopathy. Thyroid unremarkable. Upper chest: There is a 2.4 cm opacity within the left lung apex which is suspicious for lung malignancy. (Series 7, image 289). Background emphysema emphysematous changes Review of the MIP images confirms the above findings CTA HEAD FINDINGS Anterior circulation: The intracranial internal carotid arteries are patent without significant stenosis. Minimal calcified  plaque within the pre cavernous right ICA. The M1 middle cerebral arteries are patent without significant stenosis. No M2 proximal branch occlusion or high-grade proximal stenosis is identified. The anterior cerebral arteries are patent without significant proximal stenosis. No intracranial aneurysm is identified. Posterior circulation: The intracranial vertebral arteries are patent without significant stenosis, as is the basilar artery. The posterior cerebral arteries are patent without significant proximal stenosis. Posterior communicating arteries are hypoplastic or absent bilaterally. Venous sinuses: Within limitations of contrast timing, no convincing thrombus. Anatomic variants: As described Review of the MIP images confirms the above findings IMPRESSION: CTA neck: 1. The common carotid, internal carotid and vertebral arteries are patent within the neck without hemodynamically significant stenosis. Mild atherosclerotic disease within these vessels as described. No evidence of traumatic vascular injury. 2. 2.4 CM OPACITY WITHIN THE LEFT LUNG APEX. THIS IS HIGHLY CONCERNING FOR PRIMARY LUNG MALIGNANCY, ALTHOUGH COULD ALTERNATIVELY REFLECT PLEURAL-PARENCHYMAL SCARRING. PET-CT IS RECOMMENDED FOR FURTHER EVALUATION. CTA head: 1. No intracranial large vessel occlusion or proximal high-grade arterial stenosis. 2. Minimal calcified plaque within the pre cavernous right internal carotid artery. Electronically Signed   By: Kellie Simmering DO   On: 04/05/2020 08:52   CT Cervical Spine Wo Contrast  Result Date: 04/05/2020 CLINICAL DATA:  [address]. Hit head. EXAM: CT HEAD WITHOUT CONTRAST CT CERVICAL SPINE WITHOUT CONTRAST TECHNIQUE: Multidetector CT imaging of the head and cervical spine was performed following the standard protocol without intravenous contrast. Multiplanar CT image reconstructions of the cervical spine were also generated. COMPARISON:  Head CT 12/13/2013 FINDINGS: CT HEAD FINDINGS Brain: Stable age related  cerebral atrophy, ventriculomegaly and periventricular white matter disease. No extra-axial fluid collections are identified. No CT findings for acute hemispheric infarction or intracranial hemorrhage. No mass lesions. The brainstem and cerebellum are normal. Vascular: Vascular calcifications but no aneurysm or hyperdense vessels. Skull: No skull fracture or bone lesions. Sinuses/Orbits: Scattered opacified ethmoid air cells. There is also moderate mucoperiosteal thickening involving both maxillary sinuses. The mastoid air cells and middle ear cavities are clear. The globes are intact. Other: No scalp lesions or hematoma. CT CERVICAL SPINE FINDINGS Alignment: Normal overall alignment. Skull base and vertebrae: No acute fractures identified. Evidence of prior surgery with wide decompressive laminectomy from C3-C6. Severe degenerative disc disease at C3-4 and C4-5 with partial interbody fusion changes. Soft tissues and spinal canal:  No prevertebral fluid or swelling. No visible canal hematoma. Disc levels: Very generous spinal canal. No significant spinal stenosis. Mild multilevel foraminal stenosis due to uncinate spurring and facet disease. Upper chest: Indeterminate 2.2 cm left apical lung lesion. This could be scarring change but could not exclude neoplasm. Recommend dedicated full chest CT for further evaluation. Underlying emphysematous changes are noted. Other: Scattered carotid artery calcifications. IMPRESSION: 1. Stable age related cerebral atrophy, ventriculomegaly and periventricular white matter disease. 2. No acute intracranial findings or skull fracture. 3. Degenerative cervical spondylosis with multilevel disc disease and facet disease but no acute cervical spine fracture. 4. Evidence of prior surgery with wide decompressive laminectomy from C3-C6. 5. **An incidental finding of potential clinical significance has been found. Indeterminate 2.2 cm left apical lung lesion. This could be scarring change  but could not exclude neoplasm. Recommend dedicated full chest CT for further evaluation.** Electronically Signed   By: Marijo Sanes M.D.   On: 04/05/2020 06:03   NM PET Image Initial (PI) Skull Base To Thigh  Result Date: 04/10/2020 CLINICAL DATA:  Initial treatment strategy for left apical lung nodule. EXAM: NUCLEAR MEDICINE PET SKULL BASE TO THIGH TECHNIQUE: 11.2 mCi F-18 FDG was injected intravenously. Full-ring PET imaging was performed from the skull base to thigh after the radiotracer. CT data was obtained and used for attenuation correction and anatomic localization. Fasting blood glucose: 123 mg/dl COMPARISON:  Multiple exams, including bone survey of 03/29/2020 and CT abdomen of 03/31/2019 as well as CT neck from 04/05/2020 FINDINGS: Mediastinal blood pool activity: SUV max 2.0 Liver activity: SUV max NA NECK: No significant abnormal hypermetabolic activity in this region. Incidental CT findings: Chronic ethmoid and right maxillary sinusitis. Multilevel cervical laminectomy. Multilevel cervical foraminal impingement. Bilateral common carotid atherosclerotic calcifications. CHEST: There is biapical scarring but with asymmetric nodularity on the left side measuring 2.1 by 2.1 cm on image 65/3 with maximum SUV of 2.2. Incidental CT findings: Coronary, aortic arch, and branch vessel atherosclerotic vascular disease. Centrilobular emphysema. ABDOMEN/PELVIS: No significant abnormal hypermetabolic activity in this region. Incidental CT findings: Hypodense right mid kidney lesion 2.9 by 6.2 cm, photopenic and likely a cyst. Similar medial 2.3 cm lesion is photopenic from the right mid kidney and likely a cyst. Aortoiliac atherosclerotic vascular disease. Prostatomegaly. Sigmoid colon diverticulosis. Low-density fullness of the left adrenal gland without discrete mass. SKELETON: No significant abnormal hypermetabolic activity in this region. Incidental CT findings: Lumbar spondylosis and degenerative disc  disease with multilevel subluxations and associated impingement. IMPRESSION: 1. 2.1 by 2.1 cm left apical nodule has a maximum SUV of 2.2. Given the asymmetric appearance there is a distinct possibility of low-grade adenocarcinoma despite the low-grade degree of activity. Inflammatory scarring from granulomatous disease is a differential diagnostic consideration. Percutaneous biopsy may carry increased risk of pneumothorax given the degree of emphysema. Possibilities for further workup may include tissue sampling, close CT surveillance, or presumptive removal. 2. Other imaging findings of potential clinical significance: Chronic paranasal sinusitis. Multilevel cervical laminectomy with multilevel cervical foraminal impingement. Aortic Atherosclerosis (ICD10-I70.0). Coronary atherosclerosis. Emphysema (ICD10-J43.9). Prostatomegaly. Sigmoid colon diverticulosis. Multilevel lumbar impingement. Electronically Signed   By: Van Clines M.D.   On: 04/10/2020 06:59   DG Bone Survey Met  Result Date: 03/31/2020 CLINICAL DATA:  MGUS.  Evaluate for lytic lesions. EXAM: METASTATIC BONE SURVEY COMPARISON:  None. FINDINGS: Multilevel degenerative disc disease, moderate to severe, in the cervical spine. Multilevel degenerative disc disease in the thoracic spine. Scoliotic curvature lumbar spine, apex to the right.  Grade 1 retrolisthesis of L2 versus L3. Multilevel degenerative disc disease in the lumbar spine. No lytic lesions identified. IMPRESSION: No bony metastatic disease or lytic lesions identified. Electronically Signed   By: Dorise Bullion III M.D   On: 03/31/2020 05:35     ASSESSMENT:  1.  IgM kappa MGUS: -Serum immunofixation shows IgM kappa. -SPEP was negative.  Free kappa light chains of 48.5, lambda light chains 24.8, ratio of 1.96. -Beta-2 microglobulin 3.2.  LDH 105. -Creatinine 1.95 and calcium 9.7. -24-hour urine total protein was 572 mg.  M spike was negative. -Skeletal survey on 03/29/2020  did not show any lytic lesions. -PET scan on 04/09/2020 did not show any skeletal abnormalities.  2.  Left lung mass: -he quit smoking in 1999, smoked about 46 pack years. -PET scan on 04/09/2020 showed 2.1 x 2.1 cm left apical nodule with SUV 2.2.  Possibility of low-grade adenocarcinoma.   PLAN:  1.  IgM kappa MGUS: -I have discussed normal prognosis of MGUS with 1% of people per year transforming into myeloma. -He does not have any "crab" features.  Hence we will continue to monitor.  2.  Left lung mass: -He is an ex-smoker.  He does not have any history of connective tissue disorders. -I reviewed PET scan images with the patient and his daughter in detail. -I have recommended pulmonary function testing.  Patient has fairly good performance status. -I have ordered pulmonary consultation for possible navigational bronchoscopy and biopsy. -I will see him back after the pulmonary evaluation.  Orders placed this encounter:  No orders of the defined types were placed in this encounter.  Total time spent is 40 minutes with more than 50% of the time spent face-to-face discussing and reviewing images, lab results, treatment plan, counseling and coordination of care.  Derek Jack, MD [place] [phone]   I, Milinda Antis, am acting as a scribe for Dr. Sanda Linger.  I, Derek Jack MD, have reviewed the above documentation for accuracy and completeness, and I agree with the above.

## 2020-04-11 NOTE — Patient Instructions (Signed)
[place] at [facility] Discharge Instructions  You were seen today by Dr. Delton Coombes. He went over your recent results and scans, as well as the possible diagnoses. You will be referred to pulmonology for further testing and lung nodule biopsy. Dr. Delton Coombes will see you back in 6 weeks, after your biopsy, for labs and follow up.   Thank you for choosing [place] at [facility] to provide your oncology and hematology care.  To afford each patient quality time with our provider, please arrive at least 15 minutes before your scheduled appointment time.   If you have a lab appointment with the [place] please come in thru the Main Entrance and check in at the main information desk  You need to re-schedule your appointment should you arrive 10 or more minutes late.  We strive to give you quality time with our providers, and arriving late affects you and other patients whose appointments are after yours.  Also, if you no show three or more times for appointments you may be dismissed from the clinic at the providers discretion.     Again, thank you for choosing [facility].  Our hope is that these requests will decrease the amount of time that you wait before being seen by our physicians.       _____________________________________________________________  Should you have questions after your visit to [facility], please contact our office at [phone] between the hours of 8:00 a.m. and 4:30 p.m.  Voicemails left after 4:00 p.m. will not be returned until the following business day.  For prescription refill requests, have your pharmacy contact our office and allow 72 hours.    Cancer Center Support Programs:   > Cancer Support Group  2nd Tuesday of the month 1pm-2pm, Journey Room

## 2020-04-18 ENCOUNTER — Telehealth: Payer: Self-pay | Admitting: Emergency Medicine

## 2020-04-18 NOTE — Telephone Encounter (Signed)
Spoke with the pt and verified his appt with Dr Lamonte Sakai  Nothing further needed

## 2020-04-23 ENCOUNTER — Other Ambulatory Visit: Payer: Self-pay

## 2020-04-23 ENCOUNTER — Other Ambulatory Visit (HOSPITAL_COMMUNITY)
Admission: RE | Admit: 2020-04-23 | Discharge: 2020-04-23 | Disposition: A | Discharge status: Home / Self Care | Payer: Medicare HMO | Source: Ambulatory Visit | Attending: Hematology | Admitting: Hematology

## 2020-04-23 DIAGNOSIS — Z01812 Encounter for preprocedural laboratory examination: Secondary | ICD-10-CM | POA: Insufficient documentation

## 2020-04-23 DIAGNOSIS — Z20822 Contact with and (suspected) exposure to covid-19: Secondary | ICD-10-CM | POA: Insufficient documentation

## 2020-04-23 LAB — SARS CORONAVIRUS 2 (TAT 6-24 HRS): SARS Coronavirus 2: NEGATIVE

## 2020-04-26 ENCOUNTER — Ambulatory Visit (HOSPITAL_COMMUNITY)
Admission: RE | Admit: 2020-04-26 | Discharge: 2020-04-26 | Disposition: A | Discharge status: Home / Self Care | Payer: Medicare HMO | Source: Ambulatory Visit | Attending: Hematology | Admitting: Hematology

## 2020-04-26 ENCOUNTER — Other Ambulatory Visit: Payer: Self-pay

## 2020-04-26 DIAGNOSIS — R918 Other nonspecific abnormal finding of lung field: Secondary | ICD-10-CM | POA: Insufficient documentation

## 2020-04-26 LAB — PULMONARY FUNCTION TEST
DL/VA % pred: 90 %
DL/VA: 3.53 ml/min/mmHg/L
DLCO unc % pred: 85 %
DLCO unc: 18.7 ml/min/mmHg
FEF 25-75 Post: 1.54 L/sec
FEF 25-75 Pre: 0.71 L/sec
FEF2575-%Change-Post: 115 %
FEF2575-%Pred-Post: 99 %
FEF2575-%Pred-Pre: 45 %
FEV1-%Change-Post: 23 %
FEV1-%Pred-Post: 101 %
FEV1-%Pred-Pre: 82 %
FEV1-Post: 2.41 L
FEV1-Pre: 1.95 L
FEV1FVC-%Change-Post: 11 %
FEV1FVC-%Pred-Pre: 78 %
FEV6-%Change-Post: 18 %
FEV6-%Pred-Post: 118 %
FEV6-%Pred-Pre: 100 %
FEV6-Post: 3.72 L
FEV6-Pre: 3.15 L
FEV6FVC-%Change-Post: 7 %
FEV6FVC-%Pred-Post: 104 %
FEV6FVC-%Pred-Pre: 97 %
FVC-%Change-Post: 10 %
FVC-%Pred-Post: 113 %
FVC-%Pred-Pre: 103 %
FVC-Post: 3.88 L
FVC-Pre: 3.51 L
Post FEV1/FVC ratio: 62 %
Post FEV6/FVC ratio: 96 %
Pre FEV1/FVC ratio: 56 %
Pre FEV6/FVC Ratio: 90 %
RV % pred: 234 %
RV: 6 L
TLC % pred: 143 %
TLC: 9.27 L

## 2020-04-26 MED ORDER — ALBUTEROL SULFATE (2.5 MG/3ML) 0.083% IN NEBU
2.5000 mg | INHALATION_SOLUTION | Freq: Once | RESPIRATORY_TRACT | Status: AC
  Administered 2020-04-26: 2.5 mg via RESPIRATORY_TRACT

## 2020-05-02 ENCOUNTER — Ambulatory Visit (HOSPITAL_COMMUNITY): Payer: Medicare HMO | Admitting: Hematology

## 2020-05-14 ENCOUNTER — Encounter: Payer: Self-pay | Admitting: Emergency Medicine

## 2020-05-14 ENCOUNTER — Other Ambulatory Visit: Payer: Self-pay

## 2020-05-14 ENCOUNTER — Ambulatory Visit: Payer: Medicare HMO | Admitting: Emergency Medicine

## 2020-05-14 ENCOUNTER — Telehealth: Payer: Self-pay | Admitting: Emergency Medicine

## 2020-05-14 VITALS — BP 118/68 | HR 73 | Temp 98.3°F | Ht 68.0 in | Wt 159.4 lb

## 2020-05-14 DIAGNOSIS — R918 Other nonspecific abnormal finding of lung field: Secondary | ICD-10-CM

## 2020-05-14 LAB — CBC
HCT: 38.9 % — ABNORMAL LOW (ref 39.0–52.0)
Hemoglobin: 13.1 g/dL (ref 13.0–17.0)
MCHC: 33.5 g/dL (ref 30.0–36.0)
MCV: 89.6 fl (ref 78.0–100.0)
Platelets: 158 10*3/uL (ref 150.0–400.0)
RBC: 4.34 Mil/uL (ref 4.22–5.81)
RDW: 14.3 % (ref 11.5–15.5)
WBC: 5.7 10*3/uL (ref 4.0–10.5)

## 2020-05-14 LAB — BASIC METABOLIC PANEL
BUN: 31 mg/dL — ABNORMAL HIGH (ref 6–23)
CO2: 27 mEq/L (ref 19–32)
Calcium: 10.3 mg/dL (ref 8.4–10.5)
Chloride: 104 mEq/L (ref 96–112)
Creatinine, Ser: 2.11 mg/dL — ABNORMAL HIGH (ref 0.40–1.50)
GFR: 30.07 mL/min — ABNORMAL LOW (ref >60.00)
Glucose, Bld: 130 mg/dL — ABNORMAL HIGH (ref 70–99)
Potassium: 4.8 mEq/L (ref 3.5–5.1)
Sodium: 138 mEq/L (ref 135–145)

## 2020-05-14 LAB — PROTIME-INR
INR: 1 ratio (ref 0.8–1.0)
Prothrombin Time: 10.7 s (ref 9.6–13.1)

## 2020-05-14 NOTE — Telephone Encounter (Signed)
Patient's Super D CT has been scheduled at [facility] on 05/22/2020 because [facility] didn't have anything until 06/05/20.  Dr. Valeta Harms already had 8/17 filled completely filled up. Mr. Foot has been scheduled on 05/29/20 @ 11:30am at [facility]. Covid test has been scheduled on 05/26/20 at 11:30am [place]. [place]. Wife is aware of all appts.    CASE # [phone]

## 2020-05-14 NOTE — Patient Instructions (Addendum)
We will work on arranging for navigational bronchoscopy to further investigate your left upper lobe nodule.  Hopefully we can do this on 05/22/2020. Lab work today ECG today We will perform a CT scan of your chest at [facility] to help with the procedure Follow with Dr Lamonte Sakai in 1 month

## 2020-05-14 NOTE — Telephone Encounter (Signed)
noted 

## 2020-05-14 NOTE — Progress Notes (Signed)
Subjective:    Patient ID: William Wells, male    DOB: 1934/08/15, 86 y.o.   MRN: [id number]  HPI 86 year old former smoker (60 pack years), history of diabetes, hypertension, hyperlipidemia, MGUS, chronic renal insufficiency, GERD.  He was found to have a 2 x 2 centimeter hypermetabolic left upper lobe nodule on screening PET scan done 04/10/2020.  He is referred today for further evaluation of this abnormality. States that he has exertional SOB but doesn't bother him much. Can happen with yard work, walking a long distance. [place] with hills or stairs. No real cough. Occasional wheeze - rare.   He fell and had trauma series CT done that showed a 2.1 LUL nodule PET scan 04/09/2020 reviewed by me, shows 2.1 x 2.1 asymmetric left upper lobe nodule, SUV 2.2.  Also noted were right renal cysts  Pulmonary function testing was performed 04/26/2020 which I have reviewed, shows mild obstruction with a vigorous bronchodilator response, severe hyperinflation, normal diffusion capacity.    Review of Systems Past Medical History:  Diagnosis Date  . Acid reflux disease   . Diabetes mellitus   . High cholesterol   . Hypertension   . Monoclonal gammopathy of unknown significance (MGUS) 03/28/2020  . Normocytic anemia 03/28/2020  . Renal disorder    renal insufficiency     Family History  Problem Relation Age of Onset  . Diabetes Mother   . Cancer Father   . Cancer Sister   . Diabetes Brother   . Cancer Brother   . Diabetes Brother   . Parkinson's disease Brother   . Diabetes Brother     Family history of lung cancer in his father and his brother  Social History   Socioeconomic History  . Marital status: Married    Spouse name: Not on file  . Number of children: 4  . Years of education: Not on file  . Highest education level: Not on file  Occupational History  . Occupation: RETIRED  Tobacco Use  . Smoking status: Former Research scientist (life sciences)  . Smokeless tobacco: Never Used  Substance and Sexual  Activity  . Alcohol use: No  . Drug use: No  . Sexual activity: Not Currently  Other Topics Concern  . Not on file  Social History Narrative  . Not on file   Social Determinants of Health   Financial Resource Strain:   . Difficulty of Paying Living Expenses:   Food Insecurity:   . Worried About Charity fundraiser in the Last Year:   . Arboriculturist in the Last Year:   Transportation Needs:   . Film/video editor (Medical):   Marland Kitchen Lack of Transportation (Non-Medical):   Physical Activity:   . Days of Exercise per Week:   . Minutes of Exercise per Session:   Stress:   . Feeling of Stress :   Social Connections:   . Frequency of Communication with Friends and Family:   . Frequency of Social Gatherings with Friends and Family:   . Attends Religious Services:   . Active Member of Clubs or Organizations:   . Attends Archivist Meetings:   Marland Kitchen Marital Status:   Intimate Partner Violence:   . Fear of Current or Ex-Partner:   . Emotionally Abused:   Marland Kitchen Physically Abused:   . Sexually Abused:      No Known Allergies   Outpatient Medications Prior to Visit  Medication Sig Dispense Refill  . amLODipine (NORVASC) 10 MG tablet Take 10  mg by mouth daily.     . Butalbital-APAP-Caff-Cod 50-300-40-30 MG CAPS Take 1 capsule by mouth daily.     . Cholecalciferol 25 MCG (1000 UT) capsule Take by mouth.    Marland Kitchen HYDROcodone-acetaminophen (NORCO/VICODIN) 5-325 MG tablet Take 1 tablet by mouth every 6 (six) hours as needed. 5 tablet 0  . Lancets (ONETOUCH DELICA PLUS [id number]) MISC 2 (two) times daily.    Marland Kitchen levothyroxine (SYNTHROID) 50 MCG tablet Take 50 mcg by mouth daily.     . methocarbamol (ROBAXIN) 500 MG tablet Take 1 tablet (500 mg total) by mouth every 6 (six) hours as needed for muscle spasms. 40 tablet 0  . omeprazole (PRILOSEC) 20 MG capsule Take 20 mg by mouth daily.    Glory Rosebush ULTRA test strip 2 (two) times daily.    . rosuvastatin (CRESTOR) 40 MG tablet Take 40 mg  by mouth daily.     . sodium bicarbonate 650 MG tablet Take 650 mg by mouth daily.     Marland Kitchen glipiZIDE (GLUCOTROL XL) 2.5 MG 24 hr tablet Take by mouth. (Patient not taking: Reported on 04/11/2020)     No facility-administered medications prior to visit.       Objective:   Physical Exam Vitals:   05/14/20 1020  BP: 118/68  Pulse: 73  Temp: 98.3 F (36.8 C)  TempSrc: Temporal  SpO2: 99%  Weight: 159 lb 6.4 oz (72.3 kg)  Height: 5\' 8"  (1.727 m)   Gen: Pleasant, well-nourished, in no distress,  normal affect  ENT: No lesions,  mouth clear,  oropharynx clear, no postnasal drip  Neck: No JVD, no stridor  Lungs: No use of accessory muscles, no crackles or wheezing on normal respiration, no wheeze on forced expiration  Cardiovascular: RRR, heart sounds normal, no murmur or gallops, no peripheral edema  Musculoskeletal: No deformities, no cyanosis or clubbing  Neuro: alert, awake, non focal  Skin: Warm, no lesions or rash      Assessment & Plan:  Mass of upper lobe of left lung 2.1 cm irregular left upper lobe pulmonary nodule found first on trauma series CT, hypermetabolic (SUV 2.2) on PET scan.  At least moderate suspicion for primary lung cancer.  Discussed the pros and cons of watchful waiting versus bronchoscopy versus primary resection.  He wants to proceed with navigational bronchoscopy.  I agree with this plan.  I will get scheduled as soon as possible.  He has a history of hypertension and diabetes.  I will check an ECG today and if any suspicious findings then will arrange for cardiac evaluation and cardiac clearance before we proceed.  We will work on arranging for navigational bronchoscopy to further investigate your left upper lobe nodule.  Hopefully we can do this on 05/22/2020. Lab work today We will perform a CT scan of your chest at [facility] to help with the procedure Follow with Dr Lamonte Sakai in 1 month  Baltazar Apo, MD, PhD 05/14/2020, 11:07 AM [place] [facility] [phone] or if no answer [phone]

## 2020-05-14 NOTE — H&P (View-Only) (Signed)
Subjective:    Patient ID: William Wells, male    DOB: 11/06/1933, 86 y.o.   MRN: [id number]  HPI 86 year old former smoker (60 pack years), history of diabetes, hypertension, hyperlipidemia, MGUS, chronic renal insufficiency, GERD.  He was found to have a 2 x 2 centimeter hypermetabolic left upper lobe nodule on screening PET scan done 04/10/2020.  He is referred today for further evaluation of this abnormality. States that he has exertional SOB but doesn't bother him much. Can happen with yard work, walking a long distance. [place] with hills or stairs. No real cough. Occasional wheeze - rare.   He fell and had trauma series CT done that showed a 2.1 LUL nodule PET scan 04/09/2020 reviewed by me, shows 2.1 x 2.1 asymmetric left upper lobe nodule, SUV 2.2.  Also noted were right renal cysts  Pulmonary function testing was performed 04/26/2020 which I have reviewed, shows mild obstruction with a vigorous bronchodilator response, severe hyperinflation, normal diffusion capacity.    Review of Systems Past Medical History:  Diagnosis Date  . Acid reflux disease   . Diabetes mellitus   . High cholesterol   . Hypertension   . Monoclonal gammopathy of unknown significance (MGUS) 03/28/2020  . Normocytic anemia 03/28/2020  . Renal disorder    renal insufficiency     Family History  Problem Relation Age of Onset  . Diabetes Mother   . Cancer Father   . Cancer Sister   . Diabetes Brother   . Cancer Brother   . Diabetes Brother   . Parkinson's disease Brother   . Diabetes Brother     Family history of lung cancer in his father and his brother  Social History   Socioeconomic History  . Marital status: Married    Spouse name: Not on file  . Number of children: 4  . Years of education: Not on file  . Highest education level: Not on file  Occupational History  . Occupation: RETIRED  Tobacco Use  . Smoking status: Former Research scientist (life sciences)  . Smokeless tobacco: Never Used  Substance and Sexual  Activity  . Alcohol use: No  . Drug use: No  . Sexual activity: Not Currently  Other Topics Concern  . Not on file  Social History Narrative  . Not on file   Social Determinants of Health   Financial Resource Strain:   . Difficulty of Paying Living Expenses:   Food Insecurity:   . Worried About Charity fundraiser in the Last Year:   . Arboriculturist in the Last Year:   Transportation Needs:   . Film/video editor (Medical):   Marland Kitchen Lack of Transportation (Non-Medical):   Physical Activity:   . Days of Exercise per Week:   . Minutes of Exercise per Session:   Stress:   . Feeling of Stress :   Social Connections:   . Frequency of Communication with Friends and Family:   . Frequency of Social Gatherings with Friends and Family:   . Attends Religious Services:   . Active Member of Clubs or Organizations:   . Attends Archivist Meetings:   Marland Kitchen Marital Status:   Intimate Partner Violence:   . Fear of Current or Ex-Partner:   . Emotionally Abused:   Marland Kitchen Physically Abused:   . Sexually Abused:      No Known Allergies   Outpatient Medications Prior to Visit  Medication Sig Dispense Refill  . amLODipine (NORVASC) 10 MG tablet Take 10  mg by mouth daily.     . Butalbital-APAP-Caff-Cod 50-300-40-30 MG CAPS Take 1 capsule by mouth daily.     . Cholecalciferol 25 MCG (1000 UT) capsule Take by mouth.    Marland Kitchen HYDROcodone-acetaminophen (NORCO/VICODIN) 5-325 MG tablet Take 1 tablet by mouth every 6 (six) hours as needed. 5 tablet 0  . Lancets (ONETOUCH DELICA PLUS [id number]) MISC 2 (two) times daily.    Marland Kitchen levothyroxine (SYNTHROID) 50 MCG tablet Take 50 mcg by mouth daily.     . methocarbamol (ROBAXIN) 500 MG tablet Take 1 tablet (500 mg total) by mouth every 6 (six) hours as needed for muscle spasms. 40 tablet 0  . omeprazole (PRILOSEC) 20 MG capsule Take 20 mg by mouth daily.    Glory Rosebush ULTRA test strip 2 (two) times daily.    . rosuvastatin (CRESTOR) 40 MG tablet Take 40 mg  by mouth daily.     . sodium bicarbonate 650 MG tablet Take 650 mg by mouth daily.     Marland Kitchen glipiZIDE (GLUCOTROL XL) 2.5 MG 24 hr tablet Take by mouth. (Patient not taking: Reported on 04/11/2020)     No facility-administered medications prior to visit.       Objective:   Physical Exam Vitals:   05/14/20 1020  BP: 118/68  Pulse: 73  Temp: 98.3 F (36.8 C)  TempSrc: Temporal  SpO2: 99%  Weight: 159 lb 6.4 oz (72.3 kg)  Height: 5\' 8"  (1.727 m)   Gen: Pleasant, well-nourished, in no distress,  normal affect  ENT: No lesions,  mouth clear,  oropharynx clear, no postnasal drip  Neck: No JVD, no stridor  Lungs: No use of accessory muscles, no crackles or wheezing on normal respiration, no wheeze on forced expiration  Cardiovascular: RRR, heart sounds normal, no murmur or gallops, no peripheral edema  Musculoskeletal: No deformities, no cyanosis or clubbing  Neuro: alert, awake, non focal  Skin: Warm, no lesions or rash      Assessment & Plan:  Mass of upper lobe of left lung 2.1 cm irregular left upper lobe pulmonary nodule found first on trauma series CT, hypermetabolic (SUV 2.2) on PET scan.  At least moderate suspicion for primary lung cancer.  Discussed the pros and cons of watchful waiting versus bronchoscopy versus primary resection.  He wants to proceed with navigational bronchoscopy.  I agree with this plan.  I will get scheduled as soon as possible.  He has a history of hypertension and diabetes.  I will check an ECG today and if any suspicious findings then will arrange for cardiac evaluation and cardiac clearance before we proceed.  We will work on arranging for navigational bronchoscopy to further investigate your left upper lobe nodule.  Hopefully we can do this on 05/22/2020. Lab work today We will perform a CT scan of your chest at [facility] to help with the procedure Follow with Dr Lamonte Sakai in 1 month  Baltazar Apo, MD, PhD 05/14/2020, 11:07 AM [place] [facility] [phone] or if no answer [phone]

## 2020-05-14 NOTE — Addendum Note (Signed)
Addended by: Gavin Potters R on: 05/14/2020 01:38 PM   Modules accepted: Orders

## 2020-05-14 NOTE — Assessment & Plan Note (Signed)
2.1 cm irregular left upper lobe pulmonary nodule found first on trauma series CT, hypermetabolic (SUV 2.2) on PET scan.  At least moderate suspicion for primary lung cancer.  Discussed the pros and cons of watchful waiting versus bronchoscopy versus primary resection.  He wants to proceed with navigational bronchoscopy.  I agree with this plan.  I will get scheduled as soon as possible.  He has a history of hypertension and diabetes.  I will check an ECG today and if any suspicious findings then will arrange for cardiac evaluation and cardiac clearance before we proceed.  We will work on arranging for navigational bronchoscopy to further investigate your left upper lobe nodule.  Hopefully we can do this on 05/22/2020. Lab work today We will perform a CT scan of your chest at [facility] to help with the procedure Follow with Dr Lamonte Sakai in 1 month

## 2020-05-22 ENCOUNTER — Other Ambulatory Visit: Payer: Self-pay

## 2020-05-22 ENCOUNTER — Ambulatory Visit (INDEPENDENT_AMBULATORY_CARE_PROVIDER_SITE_OTHER)
Admission: RE | Admit: 2020-05-22 | Discharge: 2020-05-22 | Disposition: A | Discharge status: Home / Self Care | Payer: Medicare HMO | Source: Ambulatory Visit | Attending: Emergency Medicine | Admitting: Emergency Medicine

## 2020-05-22 DIAGNOSIS — R918 Other nonspecific abnormal finding of lung field: Secondary | ICD-10-CM | POA: Diagnosis not present

## 2020-05-23 ENCOUNTER — Ambulatory Visit (HOSPITAL_COMMUNITY): Payer: Medicare HMO | Admitting: Hematology

## 2020-05-26 ENCOUNTER — Other Ambulatory Visit (HOSPITAL_COMMUNITY)
Admission: RE | Admit: 2020-05-26 | Discharge: 2020-05-26 | Disposition: A | Discharge status: Home / Self Care | Payer: Medicare HMO | Source: Ambulatory Visit | Attending: Emergency Medicine | Admitting: Emergency Medicine

## 2020-05-26 DIAGNOSIS — Z20822 Contact with and (suspected) exposure to covid-19: Secondary | ICD-10-CM | POA: Diagnosis not present

## 2020-05-26 DIAGNOSIS — Z01812 Encounter for preprocedural laboratory examination: Secondary | ICD-10-CM | POA: Diagnosis present

## 2020-05-26 LAB — SARS CORONAVIRUS 2 (TAT 6-24 HRS): SARS Coronavirus 2: NEGATIVE

## 2020-05-28 ENCOUNTER — Encounter (HOSPITAL_COMMUNITY): Payer: Self-pay | Admitting: Emergency Medicine

## 2020-05-28 NOTE — Progress Notes (Signed)
Denies chest pain or cardiology visit. Reports shortness of breath which is what he is being worked up for. Reports being in quarantine since COVID test. Educated on visitation policy. Below instructions given regarding DM.   How do I manage my blood sugar before surgery? . Check your blood sugar at least 4 times a day, starting 2 days before surgery, to make sure that the level is not too high or low. o Check your blood sugar the morning of your surgery when you wake up and every 2 hours until you get to the Short Stay unit. . If your blood sugar is less than 70 mg/dL, you will need to treat for low blood sugar: o Do not take insulin. o Treat a low blood sugar (less than 70 mg/dL) with  cup of clear juice (cranberry or apple), 4 glucose tablets, OR glucose gel. Recheck blood sugar in 15 minutes after treatment (to make sure it is greater than 70 mg/dL). If your blood sugar is not greater than 70 mg/dL on recheck, call [phone] o  for further instructions. . Report your blood sugar to the short stay nurse when you get to Short Stay.  . If you are admitted to the hospital after surgery: o Your blood sugar will be checked by the staff and you will probably be given insulin after surgery (instead of oral diabetes medicines) to make sure you have good blood sugar levels. o The goal for blood sugar control after surgery is 80-180 mg/dL.   WHAT DO I DO ABOUT MY DIABETES MEDICATION?  Marland Kitchen Do not take oral diabetes medicines (pills) the morning of surgery.

## 2020-05-29 ENCOUNTER — Ambulatory Visit (HOSPITAL_COMMUNITY): Payer: Medicare HMO | Admitting: Anesthesiology

## 2020-05-29 ENCOUNTER — Other Ambulatory Visit: Payer: Self-pay

## 2020-05-29 ENCOUNTER — Ambulatory Visit (HOSPITAL_COMMUNITY): Payer: Medicare HMO

## 2020-05-29 ENCOUNTER — Ambulatory Visit (HOSPITAL_COMMUNITY)
Admission: RE | Admit: 2020-05-29 | Discharge: 2020-05-29 | Disposition: A | Discharge status: Home / Self Care | Payer: Medicare HMO | Attending: Emergency Medicine | Admitting: Emergency Medicine

## 2020-05-29 ENCOUNTER — Encounter (HOSPITAL_COMMUNITY): Payer: Self-pay | Admitting: Emergency Medicine

## 2020-05-29 ENCOUNTER — Encounter (HOSPITAL_COMMUNITY)
Admission: RE | Disposition: A | Discharge status: Home / Self Care | Payer: Self-pay | Source: Home / Self Care | Attending: Emergency Medicine

## 2020-05-29 DIAGNOSIS — Z79899 Other long term (current) drug therapy: Secondary | ICD-10-CM | POA: Diagnosis not present

## 2020-05-29 DIAGNOSIS — E785 Hyperlipidemia, unspecified: Secondary | ICD-10-CM | POA: Diagnosis not present

## 2020-05-29 DIAGNOSIS — Z79890 Hormone replacement therapy (postmenopausal): Secondary | ICD-10-CM | POA: Insufficient documentation

## 2020-05-29 DIAGNOSIS — N189 Chronic kidney disease, unspecified: Secondary | ICD-10-CM | POA: Insufficient documentation

## 2020-05-29 DIAGNOSIS — E7800 Pure hypercholesterolemia, unspecified: Secondary | ICD-10-CM | POA: Diagnosis not present

## 2020-05-29 DIAGNOSIS — K219 Gastro-esophageal reflux disease without esophagitis: Secondary | ICD-10-CM | POA: Diagnosis not present

## 2020-05-29 DIAGNOSIS — R918 Other nonspecific abnormal finding of lung field: Secondary | ICD-10-CM

## 2020-05-29 DIAGNOSIS — Z9889 Other specified postprocedural states: Secondary | ICD-10-CM

## 2020-05-29 DIAGNOSIS — E1122 Type 2 diabetes mellitus with diabetic chronic kidney disease: Secondary | ICD-10-CM | POA: Insufficient documentation

## 2020-05-29 DIAGNOSIS — Z87891 Personal history of nicotine dependence: Secondary | ICD-10-CM | POA: Insufficient documentation

## 2020-05-29 DIAGNOSIS — R911 Solitary pulmonary nodule: Secondary | ICD-10-CM | POA: Diagnosis present

## 2020-05-29 DIAGNOSIS — Z7984 Long term (current) use of oral hypoglycemic drugs: Secondary | ICD-10-CM | POA: Insufficient documentation

## 2020-05-29 DIAGNOSIS — I129 Hypertensive chronic kidney disease with stage 1 through stage 4 chronic kidney disease, or unspecified chronic kidney disease: Secondary | ICD-10-CM | POA: Insufficient documentation

## 2020-05-29 DIAGNOSIS — Z801 Family history of malignant neoplasm of trachea, bronchus and lung: Secondary | ICD-10-CM | POA: Diagnosis not present

## 2020-05-29 DIAGNOSIS — C3412 Malignant neoplasm of upper lobe, left bronchus or lung: Secondary | ICD-10-CM | POA: Insufficient documentation

## 2020-05-29 HISTORY — PX: BRONCHIAL BRUSHINGS: SHX5108

## 2020-05-29 HISTORY — DX: Type 2 diabetes mellitus without complications: E11.9

## 2020-05-29 HISTORY — PX: VIDEO BRONCHOSCOPY WITH ENDOBRONCHIAL NAVIGATION: SHX6175

## 2020-05-29 HISTORY — DX: Hypothyroidism, unspecified: E03.9

## 2020-05-29 HISTORY — PX: BRONCHIAL BIOPSY: SHX5109

## 2020-05-29 HISTORY — DX: Unspecified osteoarthritis, unspecified site: M19.90

## 2020-05-29 HISTORY — PX: BRONCHIAL WASHINGS: SHX5105

## 2020-05-29 HISTORY — PX: BRONCHIAL NEEDLE ASPIRATION BIOPSY: SHX5106

## 2020-05-29 HISTORY — DX: Diaphragmatic hernia without obstruction or gangrene: K44.9

## 2020-05-29 LAB — CBC
HCT: 42.5 % (ref 39.0–52.0)
Hemoglobin: 13.8 g/dL (ref 13.0–17.0)
MCH: 29.7 pg (ref 26.0–34.0)
MCHC: 32.5 g/dL (ref 30.0–36.0)
MCV: 91.4 fL (ref 80.0–100.0)
Platelets: 166 10*3/uL (ref 150–400)
RBC: 4.65 MIL/uL (ref 4.22–5.81)
RDW: 14.4 % (ref 11.5–15.5)
WBC: 6.5 10*3/uL (ref 4.0–10.5)
nRBC: 0 % (ref 0.0–0.2)

## 2020-05-29 LAB — BASIC METABOLIC PANEL
Anion gap: 12 (ref 5–15)
BUN: 24 mg/dL — ABNORMAL HIGH (ref 8–23)
CO2: 21 mmol/L — ABNORMAL LOW (ref 22–32)
Calcium: 9.8 mg/dL (ref 8.9–10.3)
Chloride: 104 mmol/L (ref 98–111)
Creatinine, Ser: 2.06 mg/dL — ABNORMAL HIGH (ref 0.61–1.24)
GFR calc Af Amer: 33 mL/min — ABNORMAL LOW (ref >60)
GFR calc non Af Amer: 29 mL/min — ABNORMAL LOW (ref >60)
Glucose, Bld: 137 mg/dL — ABNORMAL HIGH (ref 70–99)
Potassium: 5.2 mmol/L — ABNORMAL HIGH (ref 3.5–5.1)
Sodium: 137 mmol/L (ref 135–145)

## 2020-05-29 LAB — GLUCOSE, CAPILLARY: Glucose-Capillary: 133 mg/dL — ABNORMAL HIGH (ref 70–99)

## 2020-05-29 SURGERY — VIDEO BRONCHOSCOPY WITH ENDOBRONCHIAL NAVIGATION
Anesthesia: General

## 2020-05-29 MED ORDER — PROMETHAZINE HCL 25 MG/ML IJ SOLN: 6.2500 mg | INTRAMUSCULAR | Status: DC | PRN

## 2020-05-29 MED ORDER — FENTANYL CITRATE (PF) 100 MCG/2ML IJ SOLN: 25.0000 ug | INTRAMUSCULAR | Status: DC | PRN

## 2020-05-29 MED ORDER — ONDANSETRON HCL 4 MG/2ML IJ SOLN
INTRAMUSCULAR | Status: DC | PRN
  Administered 2020-05-29: 4 mg via INTRAVENOUS

## 2020-05-29 MED ORDER — SODIUM CHLORIDE 0.9 % IV SOLN: INTRAVENOUS | Status: DC

## 2020-05-29 MED ORDER — PROPOFOL 10 MG/ML IV BOLUS
INTRAVENOUS | Status: DC | PRN
  Administered 2020-05-29: 150 mg via INTRAVENOUS

## 2020-05-29 MED ORDER — CHLORHEXIDINE GLUCONATE 0.12 % MT SOLN
15.0000 mL | Freq: Once | OROMUCOSAL | Status: AC
  Filled 2020-05-29: qty 15

## 2020-05-29 MED ORDER — FENTANYL CITRATE (PF) 250 MCG/5ML IJ SOLN
INTRAMUSCULAR | Status: DC | PRN
  Administered 2020-05-29 (×2): 50 ug via INTRAVENOUS

## 2020-05-29 MED ORDER — LIDOCAINE 2% (20 MG/ML) 5 ML SYRINGE
INTRAMUSCULAR | Status: DC | PRN
  Administered 2020-05-29: 80 mg via INTRAVENOUS

## 2020-05-29 MED ORDER — SUGAMMADEX SODIUM 200 MG/2ML IV SOLN
INTRAVENOUS | Status: DC | PRN
  Administered 2020-05-29: 100 mg via INTRAVENOUS

## 2020-05-29 MED ORDER — SUCCINYLCHOLINE CHLORIDE 200 MG/10ML IV SOSY
PREFILLED_SYRINGE | INTRAVENOUS | Status: DC | PRN
  Administered 2020-05-29: 160 mg via INTRAVENOUS

## 2020-05-29 MED ORDER — ROCURONIUM BROMIDE 10 MG/ML (PF) SYRINGE
PREFILLED_SYRINGE | INTRAVENOUS | Status: DC | PRN
  Administered 2020-05-29: 50 mg via INTRAVENOUS

## 2020-05-29 MED ORDER — CHLORHEXIDINE GLUCONATE 0.12 % MT SOLN
OROMUCOSAL | Status: AC
  Administered 2020-05-29: 15 mL via OROMUCOSAL
  Filled 2020-05-29: qty 15

## 2020-05-29 NOTE — Interval H&P Note (Signed)
History and Physical Interval Note:  05/29/2020 9:50 AM  [name]  has presented today for surgery, with the diagnosis of MASS OF UPPER LOBE LEFT LUNG.  The various methods of treatment have been discussed with the patient and family. After consideration of risks, benefits and other options for treatment, the patient has consented to  Procedure(s): [place] (N/A) as a surgical intervention.  The patient's history has been reviewed, patient examined, no change in status, stable for surgery.  I have reviewed the patient's chart and labs.  Questions were answered to the patient's satisfaction.     Collene Gobble

## 2020-05-29 NOTE — Anesthesia Preprocedure Evaluation (Addendum)
Anesthesia Evaluation  Patient identified by MRN, date of birth, ID band Patient awake    Reviewed: Allergy & Precautions, NPO status , Patient's Chart, lab work & pertinent test results  Airway Mallampati: II  TM Distance: >3 FB Neck ROM: Limited    Dental  (+) Edentulous Upper, Edentulous Lower   Pulmonary neg pulmonary ROS, former smoker,    Pulmonary exam normal breath sounds clear to auscultation       Cardiovascular Exercise Tolerance: Good hypertension,  Rhythm:Regular Rate:Normal  EKG reviewed: 1st degree AV block   Neuro/Psych  Neuromuscular disease (sciatica) negative psych ROS   GI/Hepatic hiatal hernia, GERD  Controlled,  Endo/Other  diabetes, Type 2, Oral Hypoglycemic AgentsHypothyroidism   Renal/GU Renal InsufficiencyRenal disease     Musculoskeletal  (+) Arthritis ,   Abdominal   Peds  Hematology  (+) anemia , MGUS   Anesthesia Other Findings   Reproductive/Obstetrics                            Anesthesia Physical Anesthesia Plan  ASA: III  Anesthesia Plan: General   Post-op Pain Management:    Induction:   PONV Risk Score and Plan: 2 and Dexamethasone, Ondansetron, Propofol infusion and Treatment may vary due to age or medical condition  Airway Management Planned: Oral ETT  Additional Equipment:   Intra-op Plan:   Post-operative Plan:   Informed Consent: I have reviewed the patients History and Physical, chart, labs and discussed the procedure including the risks, benefits and alternatives for the proposed anesthesia with the patient or authorized representative who has indicated his/her understanding and acceptance.       Plan Discussed with:   Anesthesia Plan Comments:         Anesthesia Quick Evaluation

## 2020-05-29 NOTE — Transfer of Care (Signed)
Immediate Anesthesia Transfer of Care Note  Patient: William Wells  Procedure(s) Performed: VIDEO BRONCHOSCOPY WITH ENDOBRONCHIAL NAVIGATION (N/A ) BRONCHIAL BIOPSIES BRONCHIAL BRUSHINGS BRONCHIAL NEEDLE ASPIRATION BIOPSIES BRONCHIAL WASHINGS  Patient Location: PACU  Anesthesia Type:General  Level of Consciousness: awake, alert  and oriented  Airway & Oxygen Therapy: Patient Spontanous Breathing and Patient connected to face mask oxygen  Post-op Assessment: Report given to RN and Post -op Vital signs reviewed and stable  Post vital signs: Reviewed and stable  Last Vitals:  Vitals Value Taken Time  BP 133/74 05/29/20 1114  Temp    Pulse 66 05/29/20 1123  Resp 16 05/29/20 1123  SpO2 100 % 05/29/20 1123  Vitals shown include unvalidated device data.  Last Pain:  Vitals:   05/29/20 0852  TempSrc: Oral  PainSc: 0-No pain         Complications: No complications documented.

## 2020-05-29 NOTE — Op Note (Signed)
Video Bronchoscopy with Electromagnetic Navigation Procedure Note  Date of Operation: 05/29/2020  Pre-op Diagnosis: Left upper lobe nodule  Post-op Diagnosis: Same  Surgeon: Baltazar Apo  Assistants: None  Anesthesia: General endotracheal anesthesia  Operation: Flexible video fiberoptic bronchoscopy with electromagnetic navigation and biopsies.  Estimated Blood Loss: Minimal  Complications: None apparent  Indications and History: William Wells is a 86 y.o. male with history of tobacco.  He was found to have an asymptomatic left upper lobe pulmonary nodule on CT chest that was being performed after a traumatic fall.  Recommendation was made to achieve tissue diagnosis via navigational bronchoscopy the risks, benefits, complications, treatment options and expected outcomes were discussed with the patient.  The possibilities of pneumothorax, pneumonia, reaction to medication, pulmonary aspiration, perforation of a viscus, bleeding, failure to diagnose a condition and creating a complication requiring transfusion or operation were discussed with the patient who freely signed the consent.    Description of Procedure: The patient was seen in the Preoperative Area, was examined and was deemed appropriate to proceed.  The patient was taken to [facility] endoscopy room 2, identified as William Wells and the procedure verified as Flexible Video Fiberoptic Bronchoscopy.  A Time Out was held and the above information confirmed.   Prior to the date of the procedure a high-resolution CT scan of the chest was performed. Utilizing [place] a virtual tracheobronchial tree was generated to allow the creation of distinct navigation pathways to the patient's parenchymal abnormalities. After being taken to the operating room general anesthesia was initiated and the patient  was orally intubated. The video fiberoptic bronchoscope was introduced via the endotracheal tube and a general inspection was  performed which showed normal airways throughout.  There were no endobronchial lesions or abnormal secretions. The extendable working channel and locator guide were introduced into the bronchoscope. The distinct navigation pathways prepared prior to this procedure were then utilized to navigate to within 1.4 cm from the center of patient's lesion identified on CT scan. The extendable working channel was secured into place and the locator guide was withdrawn. Under fluoroscopic guidance transbronchial needle brushings, transbronchial Wang needle biopsies, and transbronchial forceps biopsies were performed to be sent for cytology and pathology. A bronchioalveolar lavage was performed in the left upper lobe and sent for cytology and microbiology (bacterial, fungal, AFB smears and cultures). At the end of the procedure a general airway inspection was performed and there was no evidence of active bleeding. The bronchoscope was removed.  The patient tolerated the procedure well. There was no significant blood loss and there were no obvious complications. A post-procedural chest x-ray is pending.  Samples: 1. Transbronchial needle brushings from left upper lobe nodule 2. Transbronchial Wang needle biopsies from left upper lobe nodule 3. Transbronchial forceps biopsies from left upper lobe nodule 4. Bronchoalveolar lavage from left upper lobe  Plans:  The patient will be discharged from the PACU to home when recovered from anesthesia and after chest x-ray is reviewed. We will review the cytology, pathology and microbiology results with the patient when they become available. Outpatient followup will be with Dr. Lamonte Sakai and Dr Delton Coombes.    Baltazar Apo, MD, PhD 05/29/2020, 11:02 AM  Pulmonary and Critical Care [phone] or if no answer [phone]

## 2020-05-29 NOTE — Anesthesia Postprocedure Evaluation (Signed)
Anesthesia Post Note  Patient: DIERRE CREVIER  Procedure(s) Performed: VIDEO BRONCHOSCOPY WITH ENDOBRONCHIAL NAVIGATION (N/A ) BRONCHIAL BIOPSIES BRONCHIAL BRUSHINGS BRONCHIAL NEEDLE ASPIRATION BIOPSIES BRONCHIAL WASHINGS     Patient location during evaluation: PACU Anesthesia Type: General Level of consciousness: awake and alert Pain management: pain level controlled Vital Signs Assessment: post-procedure vital signs reviewed and stable Respiratory status: spontaneous breathing, nonlabored ventilation, respiratory function stable and patient connected to nasal cannula oxygen Cardiovascular status: stable and blood pressure returned to baseline Postop Assessment: no apparent nausea or vomiting Anesthetic complications: no   No complications documented.  Last Vitals:  Vitals:   05/29/20 1200 05/29/20 1205  BP: 119/63 118/63  Pulse: (!) 57 (!) 57  Resp: 14 15  Temp:    SpO2: 100% 100%    Last Pain:  Vitals:   05/29/20 1145  TempSrc:   PainSc: 0-No pain                 Merlinda Frederick

## 2020-05-29 NOTE — Discharge Instructions (Signed)
Flexible Bronchoscopy, Care After This sheet gives you information about how to care for yourself after your test. Your doctor may also give you more specific instructions. If you have problems or questions, contact your doctor. Follow these instructions at home: Eating and drinking  Do not eat or drink anything (not even water) for 2 hours after your test, or until your numbing medicine (local anesthetic) wears off.  When your numbness is gone and your cough and gag reflexes have come back, you may: ? Eat only soft foods. ? Slowly drink liquids.  The day after the test, go back to your normal diet. Driving  Do not drive for 24 hours if you were given a medicine to help you relax (sedative).  Do not drive or use heavy machinery while taking prescription pain medicine. General instructions   Take over-the-counter and prescription medicines only as told by your doctor.  Return to your normal activities as told. Ask what activities are safe for you.  Do not use any products that have nicotine or tobacco in them. This includes cigarettes and e-cigarettes. If you need help quitting, ask your doctor.  Keep all follow-up visits as told by your doctor. This is important. It is very important if you had a tissue sample (biopsy) taken. Get help right away if:  You have shortness of breath that gets worse.  You get light-headed.  You feel like you are going to pass out (faint).  You have chest pain.  You cough up: ? More than a little blood. ? More blood than before. Summary  Do not eat or drink anything (not even water) for 2 hours after your test, or until your numbing medicine wears off.  Do not use cigarettes. Do not use e-cigarettes.  Get help right away if you have chest pain.  Please call our office for any questions or concerns.  [phone].  This information is not intended to replace advice given to you by your health care provider. Make sure you discuss any  questions you have with your health care provider. Document Revised: 09/04/2017 Document Reviewed: 10/10/2016 Elsevier Patient Education  2020 [employer].

## 2020-05-29 NOTE — Anesthesia Procedure Notes (Signed)
Procedure Name: Intubation Date/Time: 05/29/2020 10:02 AM Performed by: Trinna Post., CRNA Pre-anesthesia Checklist: Patient identified, Emergency Drugs available, Suction available, Patient being monitored and Timeout performed Patient Re-evaluated:Patient Re-evaluated prior to induction Oxygen Delivery Method: Circle system utilized Preoxygenation: Pre-oxygenation with 100% oxygen Induction Type: IV induction and Rapid sequence Laryngoscope Size: Mac and 4 Grade View: Grade I Tube type: Oral Tube size: 8.5 mm Number of attempts: 1 Airway Equipment and Method: Stylet Placement Confirmation: ETT inserted through vocal cords under direct vision,  positive ETCO2 and breath sounds checked- equal and bilateral Secured at: 22 cm Tube secured with: Tape Dental Injury: Teeth and Oropharynx as per pre-operative assessment

## 2020-05-30 ENCOUNTER — Encounter (HOSPITAL_COMMUNITY): Payer: Self-pay | Admitting: Emergency Medicine

## 2020-05-31 LAB — CYTOLOGY - NON PAP

## 2020-05-31 LAB — SURGICAL PATHOLOGY

## 2020-06-01 ENCOUNTER — Telehealth: Payer: Self-pay | Admitting: Emergency Medicine

## 2020-06-01 NOTE — Telephone Encounter (Signed)
Bronchoscopy results with the patient by phone today.  Shows adenocarcinoma of the lung.  He would probably be a good candidate for SBRT.  He may be a surgical candidate but he is 86 years old and has renal insufficiency which may be prohibitive.  He has follow-up planned with Dr. Delton Coombes next week to discuss therapeutics.

## 2020-06-04 ENCOUNTER — Other Ambulatory Visit: Payer: Self-pay

## 2020-06-04 ENCOUNTER — Encounter (HOSPITAL_COMMUNITY): Payer: Self-pay | Admitting: Hematology

## 2020-06-04 ENCOUNTER — Inpatient Hospital Stay (HOSPITAL_COMMUNITY): Payer: Medicare HMO | Attending: Oncology | Admitting: Hematology

## 2020-06-04 VITALS — HR 73 | Temp 97.9°F | Resp 18 | Wt 157.6 lb

## 2020-06-04 DIAGNOSIS — C3412 Malignant neoplasm of upper lobe, left bronchus or lung: Secondary | ICD-10-CM | POA: Diagnosis not present

## 2020-06-04 DIAGNOSIS — C3492 Malignant neoplasm of unspecified part of left bronchus or lung: Secondary | ICD-10-CM

## 2020-06-04 DIAGNOSIS — D472 Monoclonal gammopathy: Secondary | ICD-10-CM | POA: Diagnosis not present

## 2020-06-04 DIAGNOSIS — Z87891 Personal history of nicotine dependence: Secondary | ICD-10-CM | POA: Insufficient documentation

## 2020-06-04 NOTE — Patient Instructions (Signed)
[place] at [facility] Discharge Instructions  You were seen today by Dr. Delton Coombes. He went over your recent results. You will be referred to Dr. Roxan Hockey for your lung cancer and possible surgery. Dr. Delton Coombes will see you back in 6-8 weeks for labs and follow up.   Thank you for choosing [place] at [facility] to provide your oncology and hematology care.  To afford each patient quality time with our provider, please arrive at least 15 minutes before your scheduled appointment time.   If you have a lab appointment with the [facility] please come in thru the Main Entrance and check in at the main information desk  You need to re-schedule your appointment should you arrive 10 or more minutes late.  We strive to give you quality time with our providers, and arriving late affects you and other patients whose appointments are after yours.  Also, if you no show three or more times for appointments you may be dismissed from the clinic at the providers discretion.     Again, thank you for choosing [facility]  Our hope is that these requests will decrease the amount of time that you wait before being seen by our physicians.       _____________________________________________________________  Should you have questions after your visit to [facility], please contact our office at [phone] between the hours of 8:00 a.m. and 4:30 p.m.  Voicemails left after 4:00 p.m. will not be returned until the following business day.  For prescription refill requests, have your pharmacy contact our office and allow 72 hours.    Cancer Center Support Programs:   > Cancer Support Group  2nd Tuesday of the month 1pm-2pm, Journey Room

## 2020-06-04 NOTE — Progress Notes (Signed)
[place], [place] [address]   CLINIC:  Medical Oncology/Hematology  PCP:  Celene Squibb, MD [address] [place] [address] [phone]   REASON FOR VISIT:  Follow-up for MGUS and left lung adenocarcinoma  PRIOR THERAPY: None  NGS Results: Not done  CURRENT THERAPY: Under work-up  BRIEF ONCOLOGIC HISTORY:  Oncology History   No history exists.    CANCER STAGING: Cancer Staging No matching staging information was found for the patient.  INTERVAL HISTORY:  [name], a 86 y.o. male, returns for routine follow-up of his MGUS and left lung adenocarcinoma. Emeric was last seen on 04/11/2020.   Today he is accompanied by his wife. He reports feeling well today. He had a bronchoscopy with bronchial biopsies done with Dr. Baltazar Apo on 8/24. He is able to do all of his ADL's and denies having SOB with exertion.  REVIEW OF SYSTEMS:  Review of Systems  Constitutional: Positive for appetite change (mildly decreased) and fatigue (mild).  Respiratory: Negative for shortness of breath.   Skin: Positive for itching (occasional).  All other systems reviewed and are negative.   PAST MEDICAL/SURGICAL HISTORY:  Past Medical History:  Diagnosis Date  . Acid reflux disease   . Arthritis   . Diabetes mellitus   . Hiatal hernia   . High cholesterol   . Hypertension   . Hypothyroidism   . Monoclonal gammopathy of unknown significance (MGUS) 03/28/2020  . Normocytic anemia 03/28/2020  . Renal disorder    renal insufficiency  . Type 2 diabetes mellitus ([place])    Past Surgical History:  Procedure Laterality Date  . APPENDECTOMY    . BRONCHIAL BIOPSY  05/29/2020   Procedure: BRONCHIAL BIOPSIES;  Surgeon: Collene Gobble, MD;  Location: [facility] ENDOSCOPY;  Service: Pulmonary;;  . BRONCHIAL BRUSHINGS  05/29/2020   Procedure: BRONCHIAL BRUSHINGS;  Surgeon: Collene Gobble, MD;  Location: [facility] ENDOSCOPY;  Service: Pulmonary;;  .  BRONCHIAL NEEDLE ASPIRATION BIOPSY  05/29/2020   Procedure: BRONCHIAL NEEDLE ASPIRATION BIOPSIES;  Surgeon: Collene Gobble, MD;  Location: [place];  Service: Pulmonary;;  . BRONCHIAL WASHINGS  05/29/2020   Procedure: BRONCHIAL WASHINGS;  Surgeon: Collene Gobble, MD;  Location: [facility] ENDOSCOPY;  Service: Pulmonary;;  . EYE SURGERY    . TONSILLECTOMY    . VIDEO BRONCHOSCOPY WITH ENDOBRONCHIAL NAVIGATION N/A 05/29/2020   Procedure: VIDEO BRONCHOSCOPY WITH ENDOBRONCHIAL NAVIGATION;  Surgeon: Collene Gobble, MD;  Location: [place] ENDOSCOPY;  Service: Pulmonary;  Laterality: N/A;    SOCIAL HISTORY:  Social History   Socioeconomic History  . Marital status: Married    Spouse name: Not on file  . Number of children: 4  . Years of education: Not on file  . Highest education level: Not on file  Occupational History  . Occupation: RETIRED  Tobacco Use  . Smoking status: Former Smoker    Packs/day: 0.50    Years: 47.00    Pack years: 23.50    Types: Cigarettes    Quit date: 2020    Years since quitting: 1.6  . Smokeless tobacco: Never Used  Vaping Use  . Vaping Use: Never used  Substance and Sexual Activity  . Alcohol use: No  . Drug use: No  . Sexual activity: Not Currently  Other Topics Concern  . Not on file  Social History Narrative  . Not on file   Social Determinants of Health   Financial Resource Strain:   .  Difficulty of Paying Living Expenses: Not on file  Food Insecurity:   . Worried About Charity fundraiser in the Last Year: Not on file  . Ran Out of Food in the Last Year: Not on file  Transportation Needs:   . Lack of Transportation (Medical): Not on file  . Lack of Transportation (Non-Medical): Not on file  Physical Activity:   . Days of Exercise per Week: Not on file  . Minutes of Exercise per Session: Not on file  Stress:   . Feeling of Stress : Not on file  Social Connections:   . Frequency of Communication with Friends and Family: Not on file  . Frequency  of Social Gatherings with Friends and Family: Not on file  . Attends Religious Services: Not on file  . Active Member of Clubs or Organizations: Not on file  . Attends Archivist Meetings: Not on file  . Marital Status: Not on file  Intimate Partner Violence:   . Fear of Current or Ex-Partner: Not on file  . Emotionally Abused: Not on file  . Physically Abused: Not on file  . Sexually Abused: Not on file    FAMILY HISTORY:  Family History  Problem Relation Age of Onset  . Diabetes Mother   . Cancer Father   . Cancer Sister   . Diabetes Brother   . Cancer Brother   . Diabetes Brother   . Parkinson's disease Brother   . Diabetes Brother     CURRENT MEDICATIONS:  Current Outpatient Medications  Medication Sig Dispense Refill  . amLODipine (NORVASC) 10 MG tablet Take 10 mg by mouth daily.     . Cholecalciferol 50 MCG (2000 UT) TABS Take 2,000 Units by mouth daily.     Marland Kitchen glipiZIDE (GLUCOTROL XL) 2.5 MG 24 hr tablet Take 2.5 mg by mouth daily as needed (blood sugar above 160). Takes only if sugar is over 160.    Marland Kitchen HYDROcodone-acetaminophen (NORCO/VICODIN) 5-325 MG tablet Take 1 tablet by mouth every 6 (six) hours as needed. 5 tablet 0  . Lancets (ONETOUCH DELICA PLUS [id number]) MISC 2 (two) times daily.    Marland Kitchen levothyroxine (SYNTHROID) 50 MCG tablet Take 50 mcg by mouth daily before breakfast.     . omeprazole (PRILOSEC) 20 MG capsule Take 20 mg by mouth daily.    Glory Rosebush ULTRA test strip 2 (two) times daily.    . rosuvastatin (CRESTOR) 40 MG tablet Take 40 mg by mouth daily.      No current facility-administered medications for this visit.    ALLERGIES:  No Known Allergies  PHYSICAL EXAM:  Performance status (ECOG): 1 - Symptomatic but completely ambulatory  Vitals:   06/04/20 0950  Pulse: 73  Resp: 18  Temp: 97.9 F (36.6 C)  SpO2: 100%   Wt Readings from Last 3 Encounters:  06/04/20 157 lb 10.1 oz (71.5 kg)  05/29/20 161 lb (73 kg)  05/14/20 159 lb  6.4 oz (72.3 kg)   Physical Exam Vitals reviewed.  Constitutional:      Appearance: Normal appearance.  Cardiovascular:     Rate and Rhythm: Normal rate and regular rhythm.     Pulses: Normal pulses.     Heart sounds: Normal heart sounds.  Pulmonary:     Effort: Pulmonary effort is normal.     Breath sounds: Normal breath sounds.  Neurological:     General: No focal deficit present.     Mental Status: He is alert and oriented to  person, place, and time.  Psychiatric:        Mood and Affect: Mood normal.        Behavior: Behavior normal.      LABORATORY DATA:  I have reviewed the labs as listed.  CBC Latest Ref Rng & Units 05/29/2020 05/14/2020 04/05/2020  WBC 4.0 - 10.5 K/uL 6.5 5.7 10.3  Hemoglobin 13.0 - 17.0 g/dL 13.8 13.1 11.9(L)  Hematocrit 39 - 52 % 42.5 38.9(L) 36.1(L)  Platelets 150 - 400 K/uL 166 158.0 160   CMP Latest Ref Rng & Units 05/29/2020 05/14/2020 04/05/2020  Glucose 70 - 99 mg/dL 137(H) 130(H) 153(H)  BUN 8 - 23 mg/dL 24(H) 31(H) 29(H)  Creatinine 0.61 - 1.24 mg/dL 2.06(H) 2.11(H) 1.98(H)  Sodium 135 - 145 mmol/L 137 138 136  Potassium 3.5 - 5.1 mmol/L 5.2(H) 4.8 4.0  Chloride 98 - 111 mmol/L 104 104 102  CO2 22 - 32 mmol/L 21(L) 27 23  Calcium 8.9 - 10.3 mg/dL 9.8 10.3 9.5  Total Protein 6.5 - 8.1 g/dL - - -  Total Bilirubin 0.3 - 1.2 mg/dL - - -  Alkaline Phos 38 - 126 U/L - - -  AST 15 - 41 U/L - - -  ALT 0 - 44 U/L - - -   Surgical pathology ([id number]) on 05/29/2020: Left upper lobe lung biopsy: non-small cell carcinoma.  DIAGNOSTIC IMAGING:  I have independently reviewed the scans and discussed with the patient. DG Chest Port 1 View  Result Date: 05/29/2020 CLINICAL DATA:  Status post bronchoscopy. EXAM: PORTABLE CHEST 1 VIEW COMPARISON:  December 10, 2005. FINDINGS: The heart size and mediastinal contours are within normal limits. Both lungs are clear. No pneumothorax or pleural effusion is noted. The visualized skeletal structures are unremarkable.  IMPRESSION: No active disease. Aortic Atherosclerosis (ICD10-I70.0). Electronically Signed   By: Marijo Conception M.D.   On: 05/29/2020 11:42   CT Super D Chest Wo Contrast  Result Date: 05/22/2020 CLINICAL DATA:  Preop for EMB biopsy navigation. Left apical lung lesion. EXAM: CT CHEST WITHOUT CONTRAST TECHNIQUE: Multidetector CT imaging of the chest was performed using thin slice collimation for electromagnetic bronchoscopy planning purposes, without intravenous contrast. COMPARISON:  PET-CT 04/09/2020 and neck CT 04/06/2019 FINDINGS: Cardiovascular: The heart is normal in size. No pericardial effusion. Mild tortuosity, ectasia and moderate calcification of the thoracic aorta. No focal aneurysm. Three-vessel coronary artery calcifications are noted. Mediastinum/Nodes: No enlarged mediastinal or hilar lymph nodes. The esophagus is grossly normal. Lungs/Pleura: Stable triangular shaped sub solid nodular lesion in the left lung apex measuring approximately 2.3 x 2.0 cm on image 24/3. Extensive/advanced emphysematous changes and pulmonary scarring. No other worrisome pulmonary lesions are identified. No acute pulmonary findings. No pleural effusions. Upper Abdomen: No significant upper abdominal findings. No obvious hepatic or adrenal gland lesions are identified without contrast. Pancreas is unremarkable. No upper abdominal adenopathy. Scattered atherosclerotic calcifications. Musculoskeletal: No chest wall mass, supraclavicular or axillary adenopathy. No worrisome bone lesions. IMPRESSION: 1. Stable indeterminate 2.3 x 2.0 cm triangular shaped sub solid nodular lesion in the left upper lobe. 2. No mediastinal or hilar mass or adenopathy. 3. Extensive/advanced emphysematous changes and pulmonary scarring. 4. Three-vessel coronary artery calcifications. Aortic Atherosclerosis (ICD10-I70.0) and Emphysema (ICD10-J43.9). Electronically Signed   By: Marijo Sanes M.D.   On: 05/22/2020 12:40   DG C-ARM  BRONCHOSCOPY  Result Date: 05/29/2020 C-ARM BRONCHOSCOPY: Fluoroscopy was utilized by the requesting physician.  No radiographic interpretation.     ASSESSMENT:  1.  IgM kappa MGUS: -Serum immunofixation shows IgM kappa. -SPEP was negative.  Free kappa light chains of 48.5, lambda light chains 24.8, ratio of 1.96. -Beta-2 microglobulin 3.2.  LDH 105. -Creatinine 1.95 and calcium 9.7. -24-hour urine total protein was 572 mg.  M spike was negative. -Skeletal survey on 03/29/2020 did not show any lytic lesions. -PET scan on 04/09/2020 did not show any skeletal abnormalities.  2.  Left lung adenocarcinoma: -He quit smoking in 1999, smoked about 46 pack years. -PET scan on 04/09/2020 showed 2.1 x 2.1 cm left apical nodule with SUV 2.2.  Possibility of low-grade adenocarcinoma.   PLAN:  1.  IgM kappa MGUS: -Normal prognosis of MGUS was discussed.  He does not have any "crab" features.  No indication for treatment.  2.  Left lung adenocarcinoma: -Navigational bronchoscopy and biopsy of the left upper lobe lung lesion on 05/29/2020 consistent with non-small cell lung cancer, adenocarcinoma with TTF-1 1+. -I have recommended evaluation for surgical resection by Dr. Roxan Hockey. -If he is not a surgical candidate, we will make a referral for SBRT of the lung lesion. -We will follow up in 6 to 8 weeks.   Orders placed this encounter:  No orders of the defined types were placed in this encounter.    Derek Jack, MD [place] [phone]   I, Milinda Antis, am acting as a scribe for Dr. Sanda Linger.  I, Derek Jack MD, have reviewed the above documentation for accuracy and completeness, and I agree with the above.

## 2020-06-12 ENCOUNTER — Encounter: Payer: Self-pay | Admitting: Thoracic Surgery (Cardiothoracic Vascular Surgery)

## 2020-06-12 ENCOUNTER — Institutional Professional Consult (permissible substitution): Payer: Medicare HMO | Admitting: Thoracic Surgery (Cardiothoracic Vascular Surgery)

## 2020-06-12 ENCOUNTER — Other Ambulatory Visit: Payer: Self-pay

## 2020-06-12 ENCOUNTER — Other Ambulatory Visit: Payer: Self-pay | Admitting: *Deleted

## 2020-06-12 ENCOUNTER — Encounter: Payer: Self-pay | Admitting: *Deleted

## 2020-06-12 VITALS — BP 143/79 | HR 76 | Temp 97.9°F | Resp 20 | Ht 68.0 in | Wt 159.6 lb

## 2020-06-12 DIAGNOSIS — R918 Other nonspecific abnormal finding of lung field: Secondary | ICD-10-CM

## 2020-06-12 DIAGNOSIS — C3412 Malignant neoplasm of upper lobe, left bronchus or lung: Secondary | ICD-10-CM

## 2020-06-12 NOTE — Progress Notes (Signed)
PCP is Celene Squibb, MD Referring Provider is Derek Jack, MD  Chief Complaint  Patient presents with  . Lung Lesion    new patient consultation, Super D CT 05/22/20, PET 04/09/20, Bronch 05/29/20, PFT 04/26/20    HPI: William Wells is sent for consultation regarding a left upper lobe lung cancer.  William Wells is an 86 year old man with a past medical history significant for monoclonal gammopathy, hypertension, hyperlipidemia, hiatal hernia, acid reflux, type 2 diabetes mellitus, stage IV chronic kidney disease, and hypothyroidism.  He recently presented to the emergency room with severe right-sided neck pain and a headache.  He became dizzy and briefly passed out.  He was taken to the ED.  He had a CT of the head and neck.  No source was found for the pain.  However, there was an incidental left apical lung nodule measuring approximately 2.4 cm.  A PET/CT on 04/09/2020 showed the nodule was hypermetabolic with an SUV of 2.2.  There was no evidence of regional or distant metastasis.  He was referred to Dr. Lamonte Sakai.  Navigational bronchoscopy was performed on 05/29/2020.  Biopsy showed non-small cell carcinoma, probable adenocarcinoma.  William Wells recently retired after working [employer].  He has had significant weight loss over the past year after changing his diet due to his kidney disease.  He has lost 18 pounds over the past 6 months and 6 pounds over the past 3 months.  Overall he has lost about 70 pounds over the past year.  Occasional reflux.  Controlled with antireflux medications.  No chest pain, pressure, tightness, or shortness of breath with exertion.  He will get short of breath if he walks a very long distance at a rapid pace but can walk over a mile.  He can walk up a flight of stairs without stopping.  He does have occasional cough but no wheezing.  He bruises easily.  Zubrod Score: At the time of surgery this patient's most appropriate activity status/level should be described as: [x]      0    Normal activity, no symptoms []     1    Restricted in physical strenuous activity but ambulatory, able to do out light work []     2    Ambulatory and capable of self care, unable to do work activities, up and about >50 % of waking hours                              []     3    Only limited self care, in bed greater than 50% of waking hours []     4    Completely disabled, no self care, confined to bed or chair []     5    Moribund  Past Medical History:  Diagnosis Date  . Acid reflux disease   . Arthritis   . Diabetes mellitus   . Hiatal hernia   . High cholesterol   . Hypertension   . Hypothyroidism   . Monoclonal gammopathy of unknown significance (MGUS) 03/28/2020  . Normocytic anemia 03/28/2020  . Renal disorder    renal insufficiency  . Type 2 diabetes mellitus ([place])     Past Surgical History:  Procedure Laterality Date  . APPENDECTOMY    . BRONCHIAL BIOPSY  05/29/2020   Procedure: BRONCHIAL BIOPSIES;  Surgeon: Collene Gobble, MD;  Location: [facility] ENDOSCOPY;  Service: Pulmonary;;  . BRONCHIAL BRUSHINGS  05/29/2020   Procedure:  BRONCHIAL BRUSHINGS;  Surgeon: Collene Gobble, MD;  Location: [facility] ENDOSCOPY;  Service: Pulmonary;;  . BRONCHIAL NEEDLE ASPIRATION BIOPSY  05/29/2020   Procedure: BRONCHIAL NEEDLE ASPIRATION BIOPSIES;  Surgeon: Collene Gobble, MD;  Location: [place];  Service: Pulmonary;;  . BRONCHIAL WASHINGS  05/29/2020   Procedure: BRONCHIAL WASHINGS;  Surgeon: Collene Gobble, MD;  Location: [facility] ENDOSCOPY;  Service: Pulmonary;;  . EYE SURGERY    . TONSILLECTOMY    . VIDEO BRONCHOSCOPY WITH ENDOBRONCHIAL NAVIGATION N/A 05/29/2020   Procedure: VIDEO BRONCHOSCOPY WITH ENDOBRONCHIAL NAVIGATION;  Surgeon: Collene Gobble, MD;  Location: [place] ENDOSCOPY;  Service: Pulmonary;  Laterality: N/A;    Family History  Problem Relation Age of Onset  . Diabetes Mother   . Cancer Father   . Cancer Sister   . Diabetes Brother   . Cancer Brother   . Diabetes Brother   .  Parkinson's disease Brother   . Diabetes Brother     Social History Social History   Tobacco Use  . Smoking status: Former Smoker    Packs/day: 0.50    Years: 47.00    Pack years: 23.50    Types: Cigarettes    Quit date: 2000    Years since quitting: 21.6  . Smokeless tobacco: Never Used  Vaping Use  . Vaping Use: Never used  Substance Use Topics  . Alcohol use: No  . Drug use: No    Current Outpatient Medications  Medication Sig Dispense Refill  . amLODipine (NORVASC) 10 MG tablet Take 10 mg by mouth daily.     . Cholecalciferol 50 MCG (2000 UT) TABS Take 2,000 Units by mouth daily.     Marland Kitchen glipiZIDE (GLUCOTROL XL) 2.5 MG 24 hr tablet Take 2.5 mg by mouth daily as needed (blood sugar above 160). Takes only if sugar is over 160.    Marland Kitchen HYDROcodone-acetaminophen (NORCO/VICODIN) 5-325 MG tablet Take 1 tablet by mouth every 6 (six) hours as needed. 5 tablet 0  . Lancets (ONETOUCH DELICA PLUS [id number]) MISC 2 (two) times daily.    Marland Kitchen levothyroxine (SYNTHROID) 50 MCG tablet Take 50 mcg by mouth daily before breakfast.     . omeprazole (PRILOSEC) 20 MG capsule Take 20 mg by mouth daily.    Glory Rosebush ULTRA test strip 2 (two) times daily.    . rosuvastatin (CRESTOR) 40 MG tablet Take 40 mg by mouth daily.      No current facility-administered medications for this visit.    No Known Allergies  Review of Systems  Constitutional: Positive for fatigue and unexpected weight change (Significant weight loss with diet). Negative for appetite change.  HENT: Negative for trouble swallowing and voice change.   Respiratory: Positive for cough. Negative for shortness of breath and wheezing.   Cardiovascular: Negative for chest pain.  Gastrointestinal: Positive for abdominal pain (Reflux).  Genitourinary: Positive for frequency and urgency.       Chronic kidney disease  Musculoskeletal: Positive for arthralgias (Gout).  Neurological: Positive for dizziness (1 episode, see HPI) and syncope (1  episode, see HPI). Negative for seizures and weakness.  Hematological: Negative for adenopathy. Bruises/bleeds easily.  All other systems reviewed and are negative.   BP (!) 143/79 (BP Location: Right Arm, Patient Position: Sitting)   Pulse 76   Temp 97.9 F (36.6 C)   Resp 20   Ht 5\' 8"  (1.727 m)   Wt 159 lb 9.6 oz (72.4 kg)   SpO2 94% Comment: RA  BMI 24.27 kg/m  Physical Exam Vitals reviewed.  Constitutional:      General: He is not in acute distress.    Appearance: Normal appearance.  HENT:     Head: Normocephalic and atraumatic.  Eyes:     General: No scleral icterus.    Extraocular Movements: Extraocular movements intact.  Cardiovascular:     Rate and Rhythm: Normal rate and regular rhythm.     Pulses: Normal pulses.     Heart sounds: Normal heart sounds. No murmur heard.  No friction rub. No gallop.   Pulmonary:     Effort: Pulmonary effort is normal. No respiratory distress.     Breath sounds: Normal breath sounds. No wheezing or rales.  Abdominal:     General: There is no distension.     Palpations: Abdomen is soft.     Tenderness: There is no abdominal tenderness.  Musculoskeletal:        General: No swelling.     Cervical back: Neck supple.  Lymphadenopathy:     Cervical: No cervical adenopathy.  Skin:    General: Skin is warm and dry.  Neurological:     General: No focal deficit present.     Mental Status: He is alert and oriented to person, place, and time.     Cranial Nerves: No cranial nerve deficit.     Motor: No weakness.     Gait: Gait normal.    Diagnostic Tests: NUCLEAR MEDICINE PET SKULL BASE TO THIGH  TECHNIQUE: 11.2 mCi F-18 FDG was injected intravenously. Full-ring PET imaging was performed from the skull base to thigh after the radiotracer. CT data was obtained and used for attenuation correction and anatomic localization.  Fasting blood glucose: 123 mg/dl  COMPARISON:  Multiple exams, including bone survey of 03/29/2020  and CT abdomen of 03/31/2019 as well as CT neck from 04/05/2020  FINDINGS: Mediastinal blood pool activity: SUV max 2.0  Liver activity: SUV max NA  NECK: No significant abnormal hypermetabolic activity in this region.  Incidental CT findings: Chronic ethmoid and right maxillary sinusitis. Multilevel cervical laminectomy. Multilevel cervical foraminal impingement. Bilateral common carotid atherosclerotic calcifications.  CHEST: There is biapical scarring but with asymmetric nodularity on the left side measuring 2.1 by 2.1 cm on image 65/3 with maximum SUV of 2.2.  Incidental CT findings: Coronary, aortic arch, and branch vessel atherosclerotic vascular disease. Centrilobular emphysema.  ABDOMEN/PELVIS: No significant abnormal hypermetabolic activity in this region.  Incidental CT findings: Hypodense right mid kidney lesion 2.9 by 6.2 cm, photopenic and likely a cyst. Similar medial 2.3 cm lesion is photopenic from the right mid kidney and likely a cyst.  Aortoiliac atherosclerotic vascular disease. Prostatomegaly. Sigmoid colon diverticulosis. Low-density fullness of the left adrenal gland without discrete mass.  SKELETON: No significant abnormal hypermetabolic activity in this region.  Incidental CT findings: Lumbar spondylosis and degenerative disc disease with multilevel subluxations and associated impingement.  IMPRESSION: 1. 2.1 by 2.1 cm left apical nodule has a maximum SUV of 2.2. Given the asymmetric appearance there is a distinct possibility of low-grade adenocarcinoma despite the low-grade degree of activity. Inflammatory scarring from granulomatous disease is a differential diagnostic consideration. Percutaneous biopsy may carry increased risk of pneumothorax given the degree of emphysema. Possibilities for further workup may include tissue sampling, close CT surveillance, or presumptive removal. 2. Other imaging findings of potential clinical  significance: Chronic paranasal sinusitis. Multilevel cervical laminectomy with multilevel cervical foraminal impingement. Aortic Atherosclerosis (ICD10-I70.0). Coronary atherosclerosis. Emphysema (ICD10-J43.9). Prostatomegaly. Sigmoid colon diverticulosis. Multilevel lumbar impingement.  Electronically Signed   By: Van Clines M.D.   On: 04/10/2020 06:59 CT CHEST WITHOUT CONTRAST  TECHNIQUE: Multidetector CT imaging of the chest was performed using thin slice collimation for electromagnetic bronchoscopy planning purposes, without intravenous contrast.  COMPARISON:  PET-CT 04/09/2020 and neck CT 04/06/2019  FINDINGS: Cardiovascular: The heart is normal in size. No pericardial effusion. Mild tortuosity, ectasia and moderate calcification of the thoracic aorta. No focal aneurysm. Three-vessel coronary artery calcifications are noted.  Mediastinum/Nodes: No enlarged mediastinal or hilar lymph nodes. The esophagus is grossly normal.  Lungs/Pleura: Stable triangular shaped sub solid nodular lesion in the left lung apex measuring approximately 2.3 x 2.0 cm on image 24/3.  Extensive/advanced emphysematous changes and pulmonary scarring.  No other worrisome pulmonary lesions are identified. No acute pulmonary findings. No pleural effusions.  Upper Abdomen: No significant upper abdominal findings. No obvious hepatic or adrenal gland lesions are identified without contrast. Pancreas is unremarkable. No upper abdominal adenopathy. Scattered atherosclerotic calcifications.  Musculoskeletal: No chest wall mass, supraclavicular or axillary adenopathy.  No worrisome bone lesions.  IMPRESSION: 1. Stable indeterminate 2.3 x 2.0 cm triangular shaped sub solid nodular lesion in the left upper lobe. 2. No mediastinal or hilar mass or adenopathy. 3. Extensive/advanced emphysematous changes and pulmonary scarring. 4. Three-vessel coronary artery  calcifications.  Aortic Atherosclerosis (ICD10-I70.0) and Emphysema (ICD10-J43.9).   Electronically Signed   By: Marijo Sanes M.D.   On: 05/22/2020 12:40 I personally reviewed the CT and PET/CT images and concur with the findings noted above.  I reviewed the images with William Wells and his family.  Pulmonary function testing 04/26/2020 FVC 3.51 (103%) FEV1 1.95 (82%) FEV1 2.41 (101%) postbronchodilator RV 6.00 (234%) DLCO 18.70 (85%)  Impression: Kemp Gomes is an 86 year old man with a history of MGUS, stage IV chronic kidney disease, hypertension, hyperlipidemia, type 2 diabetes mellitus, hiatal hernia, and reflux.  He has had a catheterization in the past many years ago but has no history of significant coronary artery disease.  He recently presented with right-sided neck pain and headaches.  No cause was ever found for that. But during his evaluation there was incidental finding of a left upper lobe lung nodule.  A PET/CT showed the nodule was hypermetabolic.  Navigational bronchoscopy confirmed a diagnosis of non-small cell carcinoma.  Clinical staging is either T1 or T2, N0, M0, stage IA or B.  I had a long discussion with William Wells, his wife, and his daughter.  We reviewed the images and discussed therapeutic options including surgery, radiation, and chemotherapy.  Since this appears to be isolated disease at this point, there is no role for chemotherapy currently.  We discussed the relative advantages and disadvantages of surgery and radiation.  Basically the trade off is between the risk and discomfort associated with surgery versus the higher risk of recurrence with radiation.  Emphysema-has extensive upper lobe predominant centrilobular emphysema right greater than left.  He was not aware of that finding previously.  Flows are more than adequate to tolerate lung resection.  I discussed the procedure of robotic left VATS for left upper lobectomy with William Wells and his family.  It  is possible we could do lingular sparing lobectomy but with the degree of emphysema in the upper lobe I am not sure that would be particularly beneficial.  That assessment can be made in the operating room at the time of surgery.  I informed them of the general nature of the surgery including the need for general anesthesia,  the incisions to be used, the use of a drainage tube postoperatively, the expected hospital stay, and the overall recovery.  I informed him of the indications, risks, benefits, and alternatives.  They understand the risks include, but not limited to death, MI, DVT, PE, bleeding, possible need for transfusion, infection, prolonged air leak, cardiac arrhythmias, as well as possibility of other unstable complications.  He is at higher risk for perioperative renal failure due to his baseline chronic kidney disease.  He understands accepts the risks and strongly wishes to proceed with surgical resection.  Plan: Robotic left VATS for left upper lobectomy on Friday, 06/22/2020  Melrose Nakayama, MD [facility] [phone]

## 2020-06-12 NOTE — H&P (View-Only) (Signed)
PCP is Celene Squibb, MD Referring Provider is Derek Jack, MD  Chief Complaint  Patient presents with   Lung Lesion    new patient consultation, Super D CT 05/22/20, PET 04/09/20, Bronch 05/29/20, PFT 04/26/20    HPI: William Wells is sent for consultation regarding a left upper lobe lung cancer.  William Wells is an 86 year old man with a past medical history significant for monoclonal gammopathy, hypertension, hyperlipidemia, hiatal hernia, acid reflux, type 2 diabetes mellitus, stage IV chronic kidney disease, and hypothyroidism.  He recently presented to the emergency room with severe right-sided neck pain and a headache.  He became dizzy and briefly passed out.  He was taken to the ED.  He had a CT of the head and neck.  No source was found for the pain.  However, there was an incidental left apical lung nodule measuring approximately 2.4 cm.  A PET/CT on 04/09/2020 showed the nodule was hypermetabolic with an SUV of 2.2.  There was no evidence of regional or distant metastasis.  He was referred to Dr. Lamonte Sakai.  Navigational bronchoscopy was performed on 05/29/2020.  Biopsy showed non-small cell carcinoma, probable adenocarcinoma.  William Wells recently retired after working [employer].  He has had significant weight loss over the past year after changing his diet due to his kidney disease.  He has lost 18 pounds over the past 6 months and 6 pounds over the past 3 months.  Overall he has lost about 70 pounds over the past year.  Occasional reflux.  Controlled with antireflux medications.  No chest pain, pressure, tightness, or shortness of breath with exertion.  He will get short of breath if he walks a very long distance at a rapid pace but can walk over a mile.  He can walk up a flight of stairs without stopping.  He does have occasional cough but no wheezing.  He bruises easily.  Zubrod Score: At the time of surgery this patients most appropriate activity status/level should be described as: [x]      0    Normal activity, no symptoms []     1    Restricted in physical strenuous activity but ambulatory, able to do out light work []     2    Ambulatory and capable of self care, unable to do work activities, up and about >50 % of waking hours                              []     3    Only limited self care, in bed greater than 50% of waking hours []     4    Completely disabled, no self care, confined to bed or chair []     5    Moribund  Past Medical History:  Diagnosis Date   Acid reflux disease    Arthritis    Diabetes mellitus    Hiatal hernia    High cholesterol    Hypertension    Hypothyroidism    Monoclonal gammopathy of unknown significance (MGUS) 03/28/2020   Normocytic anemia 03/28/2020   Renal disorder    renal insufficiency   Type 2 diabetes mellitus [facility])     Past Surgical History:  Procedure Laterality Date   APPENDECTOMY     BRONCHIAL BIOPSY  05/29/2020   Procedure: BRONCHIAL BIOPSIES;  Surgeon: Collene Gobble, MD;  Location: [place];  Service: Pulmonary;;   BRONCHIAL BRUSHINGS  05/29/2020   Procedure:  BRONCHIAL BRUSHINGS;  Surgeon: Collene Gobble, MD;  Location: [facility] ENDOSCOPY;  Service: Pulmonary;;   BRONCHIAL NEEDLE ASPIRATION BIOPSY  05/29/2020   Procedure: BRONCHIAL NEEDLE ASPIRATION BIOPSIES;  Surgeon: Collene Gobble, MD;  Location: [place];  Service: Pulmonary;;   BRONCHIAL WASHINGS  05/29/2020   Procedure: BRONCHIAL WASHINGS;  Surgeon: Collene Gobble, MD;  Location: [facility] ENDOSCOPY;  Service: Pulmonary;;   EYE SURGERY     TONSILLECTOMY     VIDEO BRONCHOSCOPY WITH ENDOBRONCHIAL NAVIGATION N/A 05/29/2020   Procedure: VIDEO BRONCHOSCOPY WITH ENDOBRONCHIAL NAVIGATION;  Surgeon: Collene Gobble, MD;  Location: [facility] ENDOSCOPY;  Service: Pulmonary;  Laterality: N/A;    Family History  Problem Relation Age of Onset   Diabetes Mother    Cancer Father    Cancer Sister    Diabetes Brother    Cancer Brother    Diabetes Brother     Parkinson's disease Brother    Diabetes Brother     Social History Social History   Tobacco Use   Smoking status: Former Smoker    Packs/day: 0.50    Years: 47.00    Pack years: 23.50    Types: Cigarettes    Quit date: 2000    Years since quitting: 21.6   Smokeless tobacco: Never Used  Scientific laboratory technician Use: Never used  Substance Use Topics   Alcohol use: No   Drug use: No    Current Outpatient Medications  Medication Sig Dispense Refill   amLODipine (NORVASC) 10 MG tablet Take 10 mg by mouth daily.      Cholecalciferol 50 MCG (2000 UT) TABS Take 2,000 Units by mouth daily.      glipiZIDE (GLUCOTROL XL) 2.5 MG 24 hr tablet Take 2.5 mg by mouth daily as needed (blood sugar above 160). Takes only if sugar is over 160.     HYDROcodone-acetaminophen (NORCO/VICODIN) 5-325 MG tablet Take 1 tablet by mouth every 6 (six) hours as needed. 5 tablet 0   Lancets (ONETOUCH DELICA PLUS [id number]) MISC 2 (two) times daily.     levothyroxine (SYNTHROID) 50 MCG tablet Take 50 mcg by mouth daily before breakfast.      omeprazole (PRILOSEC) 20 MG capsule Take 20 mg by mouth daily.     ONETOUCH ULTRA test strip 2 (two) times daily.     rosuvastatin (CRESTOR) 40 MG tablet Take 40 mg by mouth daily.      No current facility-administered medications for this visit.    No Known Allergies  Review of Systems  Constitutional: Positive for fatigue and unexpected weight change (Significant weight loss with diet). Negative for appetite change.  HENT: Negative for trouble swallowing and voice change.   Respiratory: Positive for cough. Negative for shortness of breath and wheezing.   Cardiovascular: Negative for chest pain.  Gastrointestinal: Positive for abdominal pain (Reflux).  Genitourinary: Positive for frequency and urgency.       Chronic kidney disease  Musculoskeletal: Positive for arthralgias (Gout).  Neurological: Positive for dizziness (1 episode, see HPI) and syncope (1  episode, see HPI). Negative for seizures and weakness.  Hematological: Negative for adenopathy. Bruises/bleeds easily.  All other systems reviewed and are negative.   BP (!) 143/79 (BP Location: Right Arm, Patient Position: Sitting)    Pulse 76    Temp 97.9 F (36.6 C)    Resp 20    Ht 5\' 8"  (1.727 m)    Wt 159 lb 9.6 oz (72.4 kg)    SpO2 94% Comment:  RA   BMI 24.27 kg/m  Physical Exam Vitals reviewed.  Constitutional:      General: He is not in acute distress.    Appearance: Normal appearance.  HENT:     Head: Normocephalic and atraumatic.  Eyes:     General: No scleral icterus.    Extraocular Movements: Extraocular movements intact.  Cardiovascular:     Rate and Rhythm: Normal rate and regular rhythm.     Pulses: Normal pulses.     Heart sounds: Normal heart sounds. No murmur heard.  No friction rub. No gallop.   Pulmonary:     Effort: Pulmonary effort is normal. No respiratory distress.     Breath sounds: Normal breath sounds. No wheezing or rales.  Abdominal:     General: There is no distension.     Palpations: Abdomen is soft.     Tenderness: There is no abdominal tenderness.  Musculoskeletal:        General: No swelling.     Cervical back: Neck supple.  Lymphadenopathy:     Cervical: No cervical adenopathy.  Skin:    General: Skin is warm and dry.  Neurological:     General: No focal deficit present.     Mental Status: He is alert and oriented to person, place, and time.     Cranial Nerves: No cranial nerve deficit.     Motor: No weakness.     Gait: Gait normal.    Diagnostic Tests: NUCLEAR MEDICINE PET SKULL BASE TO THIGH  TECHNIQUE: 11.2 mCi F-18 FDG was injected intravenously. Full-ring PET imaging was performed from the skull base to thigh after the radiotracer. CT data was obtained and used for attenuation correction and anatomic localization.  Fasting blood glucose: 123 mg/dl  COMPARISON:  Multiple exams, including bone survey of 03/29/2020  and CT abdomen of 03/31/2019 as well as CT neck from 04/05/2020  FINDINGS: Mediastinal blood pool activity: SUV max 2.0  Liver activity: SUV max NA  NECK: No significant abnormal hypermetabolic activity in this region.  Incidental CT findings: Chronic ethmoid and right maxillary sinusitis. Multilevel cervical laminectomy. Multilevel cervical foraminal impingement. Bilateral common carotid atherosclerotic calcifications.  CHEST: There is biapical scarring but with asymmetric nodularity on the left side measuring 2.1 by 2.1 cm on image 65/3 with maximum SUV of 2.2.  Incidental CT findings: Coronary, aortic arch, and branch vessel atherosclerotic vascular disease. Centrilobular emphysema.  ABDOMEN/PELVIS: No significant abnormal hypermetabolic activity in this region.  Incidental CT findings: Hypodense right mid kidney lesion 2.9 by 6.2 cm, photopenic and likely a cyst. Similar medial 2.3 cm lesion is photopenic from the right mid kidney and likely a cyst.  Aortoiliac atherosclerotic vascular disease. Prostatomegaly. Sigmoid colon diverticulosis. Low-density fullness of the left adrenal gland without discrete mass.  SKELETON: No significant abnormal hypermetabolic activity in this region.  Incidental CT findings: Lumbar spondylosis and degenerative disc disease with multilevel subluxations and associated impingement.  IMPRESSION: 1. 2.1 by 2.1 cm left apical nodule has a maximum SUV of 2.2. Given the asymmetric appearance there is a distinct possibility of low-grade adenocarcinoma despite the low-grade degree of activity. Inflammatory scarring from granulomatous disease is a differential diagnostic consideration. Percutaneous biopsy may carry increased risk of pneumothorax given the degree of emphysema. Possibilities for further workup may include tissue sampling, close CT surveillance, or presumptive removal. 2. Other imaging findings of potential clinical  significance: Chronic paranasal sinusitis. Multilevel cervical laminectomy with multilevel cervical foraminal impingement. Aortic Atherosclerosis (ICD10-I70.0). Coronary atherosclerosis. Emphysema (ICD10-J43.9). Prostatomegaly.  Sigmoid colon diverticulosis. Multilevel lumbar impingement.   Electronically Signed   By: Van Clines M.D.   On: 04/10/2020 06:59 CT CHEST WITHOUT CONTRAST  TECHNIQUE: Multidetector CT imaging of the chest was performed using thin slice collimation for electromagnetic bronchoscopy planning purposes, without intravenous contrast.  COMPARISON:  PET-CT 04/09/2020 and neck CT 04/06/2019  FINDINGS: Cardiovascular: The heart is normal in size. No pericardial effusion. Mild tortuosity, ectasia and moderate calcification of the thoracic aorta. No focal aneurysm. Three-vessel coronary artery calcifications are noted.  Mediastinum/Nodes: No enlarged mediastinal or hilar lymph nodes. The esophagus is grossly normal.  Lungs/Pleura: Stable triangular shaped sub solid nodular lesion in the left lung apex measuring approximately 2.3 x 2.0 cm on image 24/3.  Extensive/advanced emphysematous changes and pulmonary scarring.  No other worrisome pulmonary lesions are identified. No acute pulmonary findings. No pleural effusions.  Upper Abdomen: No significant upper abdominal findings. No obvious hepatic or adrenal gland lesions are identified without contrast. Pancreas is unremarkable. No upper abdominal adenopathy. Scattered atherosclerotic calcifications.  Musculoskeletal: No chest wall mass, supraclavicular or axillary adenopathy.  No worrisome bone lesions.  IMPRESSION: 1. Stable indeterminate 2.3 x 2.0 cm triangular shaped sub solid nodular lesion in the left upper lobe. 2. No mediastinal or hilar mass or adenopathy. 3. Extensive/advanced emphysematous changes and pulmonary scarring. 4. Three-vessel coronary artery  calcifications.  Aortic Atherosclerosis (ICD10-I70.0) and Emphysema (ICD10-J43.9).   Electronically Signed   By: Marijo Sanes M.D.   On: 05/22/2020 12:40 I personally reviewed the CT and PET/CT images and concur with the findings noted above.  I reviewed the images with Mr. Dulworth and his family.  Pulmonary function testing 04/26/2020 FVC 3.51 (103%) FEV1 1.95 (82%) FEV1 2.41 (101%) postbronchodilator RV 6.00 (234%) DLCO 18.70 (85%)  Impression: William Wells is an 86 year old man with a history of MGUS, stage IV chronic kidney disease, hypertension, hyperlipidemia, type 2 diabetes mellitus, hiatal hernia, and reflux.  He has had a catheterization in the past many years ago but has no history of significant coronary artery disease.  He recently presented with right-sided neck pain and headaches.  No cause was ever found for that. But during his evaluation there was incidental finding of a left upper lobe lung nodule.  A PET/CT showed the nodule was hypermetabolic.  Navigational bronchoscopy confirmed a diagnosis of non-small cell carcinoma.  Clinical staging is either T1 or T2, N0, M0, stage IA or B.  I had a long discussion with Mr. Traynham, his wife, and his daughter.  We reviewed the images and discussed therapeutic options including surgery, radiation, and chemotherapy.  Since this appears to be isolated disease at this point, there is no role for chemotherapy currently.  We discussed the relative advantages and disadvantages of surgery and radiation.  Basically the trade off is between the risk and discomfort associated with surgery versus the higher risk of recurrence with radiation.  Emphysema-has extensive upper lobe predominant centrilobular emphysema right greater than left.  He was not aware of that finding previously.  Flows are more than adequate to tolerate lung resection.  I discussed the procedure of robotic left VATS for left upper lobectomy with Mr. Nill and his family.  It  is possible we could do lingular sparing lobectomy but with the degree of emphysema in the upper lobe I am not sure that would be particularly beneficial.  That assessment can be made in the operating room at the time of surgery.  I informed them of the general nature of  the surgery including the need for general anesthesia, the incisions to be used, the use of a drainage tube postoperatively, the expected hospital stay, and the overall recovery.  I informed him of the indications, risks, benefits, and alternatives.  They understand the risks include, but not limited to death, MI, DVT, PE, bleeding, possible need for transfusion, infection, prolonged air leak, cardiac arrhythmias, as well as possibility of other unstable complications.  He is at higher risk for perioperative renal failure due to his baseline chronic kidney disease.  He understands accepts the risks and strongly wishes to proceed with surgical resection.  Plan: Robotic left VATS for left upper lobectomy on Friday, 06/22/2020  Melrose Nakayama, MD [facility] [phone]

## 2020-06-19 NOTE — Pre-Procedure Instructions (Signed)
[id number] - [place], [place] - [address] [place] [address] Phone: [phone] Fax: [phone]      Your procedure is scheduled on Friday September 17th.  Report to [facility] Main Entrance "A" at 5:30 A.M., and check in at the Admitting office.  Call this number if you have problems the morning of surgery:  [phone]  Call [phone] if you have any questions prior to your surgery date Monday-Friday 8am-4pm    Remember:  Do not eat or drink anything after midnight the night before your surgery    Take these medicines the morning of surgery with A SIP OF WATER   amLODipine (NORVASC) 10 MG tablet  levothyroxine (SYNTHROID) 50 MCG tablet  omeprazole (PRILOSEC) 20 MG capsule  rosuvastatin (CRESTOR) 40 MG tablet    IF NEEDED  HYDROcodone-acetaminophen (NORCO/VICODIN) 5-325 MG tablet    WHAT DO I DO ABOUT MY DIABETES MEDICATION?   Marland Kitchen Do not take oral diabetes medicines (pills-Glipizide) the morning of surgery.  HOW TO MANAGE YOUR DIABETES BEFORE AND AFTER SURGERY  Why is it important to control my blood sugar before and after surgery? . Improving blood sugar levels before and after surgery helps healing and can limit problems. . A way of improving blood sugar control is eating a healthy diet by: o  Eating less sugar and carbohydrates o  Increasing activity/exercise o  Talking with your doctor about reaching your blood sugar goals . High blood sugars (greater than 180 mg/dL) can raise your risk of infections and slow your recovery, so you will need to focus on controlling your diabetes during the weeks before surgery. . Make sure that the doctor who takes care of your diabetes knows about your planned surgery including the date and location.  How do I manage my blood sugar before surgery? . Check your blood sugar at least 4 times a day, starting 2 days before surgery, to make sure that the level is not too high or  low. . Check your blood sugar the morning of your surgery when you wake up and every 2 hours until you get to the Short Stay unit. o If your blood sugar is less than 70 mg/dL, you will need to treat for low blood sugar: - Do not take insulin. - Treat a low blood sugar (less than 70 mg/dL) with  cup of clear juice (cranberry or apple), 4 glucose tablets, OR glucose gel. - Recheck blood sugar in 15 minutes after treatment (to make sure it is greater than 70 mg/dL). If your blood sugar is not greater than 70 mg/dL on recheck, call [phone] for further instructions. . Report your blood sugar to the short stay nurse when you get to Short Stay.  . If you are admitted to the hospital after surgery: o Your blood sugar will be checked by the staff and you will probably be given insulin after surgery (instead of oral diabetes medicines) to make sure you have good blood sugar levels. o The goal for blood sugar control after surgery is 80-180 mg/dL.    As of today, STOP taking any Aspirin (unless otherwise instructed by your surgeon) Aleve, Naproxen, Ibuprofen, Motrin, Advil, Goody's, BC's, all herbal medications, fish oil, and all vitamins.                      Do not wear jewelry            Do not wear lotions,  powders, colognes, or deodorant.            Do not shave 48 hours prior to surgery.  Men may shave face and neck.            Do not bring valuables to the hospital.            [facility] is not responsible for any belongings or valuables.  Do NOT Smoke (Tobacco/Vaping) or drink Alcohol 24 hours prior to your procedure If you use a CPAP at night, you may bring all equipment for your overnight stay.   Contacts, glasses, dentures or bridgework may not be worn into surgery.      For patients admitted to the hospital, discharge time will be determined by your treatment team.   Patients discharged the day of surgery will not be allowed to drive home, and someone needs to stay with them for  24 hours.    Special instructions:   [place] [facility]- Preparing For Surgery  Before surgery, you can play an important role. Because skin is not sterile, your skin needs to be as free of germs as possible. You can reduce the number of germs on your skin by washing with CHG (chlorahexidine gluconate) Soap before surgery.  CHG is an antiseptic cleaner which kills germs and bonds with the skin to continue killing germs even after washing.    Oral Hygiene is also important to reduce your risk of infection.  Remember - BRUSH YOUR TEETH THE MORNING OF SURGERY WITH YOUR REGULAR TOOTHPASTE  Please do not use if you have an allergy to CHG or antibacterial soaps. If your skin becomes reddened/irritated stop using the CHG.  Do not shave (including legs and underarms) for at least 48 hours prior to first CHG shower. It is OK to shave your face.  Please follow these instructions carefully.   1. Shower the NIGHT BEFORE SURGERY and the MORNING OF SURGERY with CHG Soap.   2. If you chose to wash your hair, wash your hair first as usual with your normal shampoo.  3. After you shampoo, rinse your hair and body thoroughly to remove the shampoo.  4. Use CHG as you would any other liquid soap. You can apply CHG directly to the skin and wash gently with a scrungie or a clean washcloth.   5. Apply the CHG Soap to your body ONLY FROM THE NECK DOWN.  Do not use on open wounds or open sores. Avoid contact with your eyes, ears, mouth and genitals (private parts). Wash Face and genitals (private parts)  with your normal soap.   6. Wash thoroughly, paying special attention to the area where your surgery will be performed.  7. Thoroughly rinse your body with warm water from the neck down.  8. DO NOT shower/wash with your normal soap after using and rinsing off the CHG Soap.  9. Pat yourself dry with a CLEAN TOWEL.  10. Wear CLEAN PAJAMAS to bed the night before surgery  11. Place CLEAN SHEETS on your bed the night  of your first shower and DO NOT SLEEP WITH PETS.   Day of Surgery: Wear Clean/Comfortable clothing the morning of surgery Do not apply any deodorants/lotions.   Remember to brush your teeth WITH YOUR REGULAR TOOTHPASTE.   Please read over the following fact sheets that you were given.

## 2020-06-20 ENCOUNTER — Encounter (HOSPITAL_COMMUNITY): Payer: Self-pay

## 2020-06-20 ENCOUNTER — Other Ambulatory Visit (HOSPITAL_COMMUNITY)
Admission: RE | Admit: 2020-06-20 | Discharge: 2020-06-20 | Disposition: A | Discharge status: Home / Self Care | Payer: Medicare HMO | Source: Ambulatory Visit | Attending: Thoracic Surgery (Cardiothoracic Vascular Surgery) | Admitting: Thoracic Surgery (Cardiothoracic Vascular Surgery)

## 2020-06-20 ENCOUNTER — Other Ambulatory Visit: Payer: Self-pay

## 2020-06-20 ENCOUNTER — Encounter (HOSPITAL_COMMUNITY)
Admission: RE | Admit: 2020-06-20 | Discharge: 2020-06-20 | Disposition: A | Discharge status: Home / Self Care | Payer: Medicare HMO | Source: Ambulatory Visit | Attending: Thoracic Surgery (Cardiothoracic Vascular Surgery) | Admitting: Thoracic Surgery (Cardiothoracic Vascular Surgery)

## 2020-06-20 ENCOUNTER — Ambulatory Visit (HOSPITAL_COMMUNITY)
Admission: RE | Admit: 2020-06-20 | Discharge: 2020-06-20 | Disposition: A | Discharge status: Home / Self Care | Payer: Medicare HMO | Source: Ambulatory Visit | Attending: Thoracic Surgery (Cardiothoracic Vascular Surgery) | Admitting: Thoracic Surgery (Cardiothoracic Vascular Surgery)

## 2020-06-20 ENCOUNTER — Other Ambulatory Visit (HOSPITAL_COMMUNITY): Payer: Medicare HMO

## 2020-06-20 DIAGNOSIS — C3412 Malignant neoplasm of upper lobe, left bronchus or lung: Secondary | ICD-10-CM

## 2020-06-20 DIAGNOSIS — I700 Atherosclerosis of aorta: Secondary | ICD-10-CM | POA: Insufficient documentation

## 2020-06-20 DIAGNOSIS — Z20822 Contact with and (suspected) exposure to covid-19: Secondary | ICD-10-CM | POA: Insufficient documentation

## 2020-06-20 DIAGNOSIS — Z01812 Encounter for preprocedural laboratory examination: Secondary | ICD-10-CM | POA: Insufficient documentation

## 2020-06-20 HISTORY — DX: Other complications of anesthesia, initial encounter: T88.59XA

## 2020-06-20 LAB — BLOOD GAS, ARTERIAL
Acid-base deficit: 0.2 mmol/L (ref 0.0–2.0)
Bicarbonate: 24 mmol/L (ref 20.0–28.0)
Drawn by: 58793
FIO2: 21
O2 Saturation: 96.8 %
Patient temperature: 37
pCO2 arterial: 39.6 mmHg (ref 32.0–48.0)
pH, Arterial: 7.4 (ref 7.350–7.450)
pO2, Arterial: 86 mmHg (ref 83.0–108.0)

## 2020-06-20 LAB — APTT: aPTT: 30 seconds (ref 24–36)

## 2020-06-20 LAB — COMPREHENSIVE METABOLIC PANEL
ALT: 24 U/L (ref 0–44)
AST: 17 U/L (ref 15–41)
Albumin: 4 g/dL (ref 3.5–5.0)
Alkaline Phosphatase: 41 U/L (ref 38–126)
Anion gap: 11 (ref 5–15)
BUN: 30 mg/dL — ABNORMAL HIGH (ref 8–23)
CO2: 21 mmol/L — ABNORMAL LOW (ref 22–32)
Calcium: 9.6 mg/dL (ref 8.9–10.3)
Chloride: 105 mmol/L (ref 98–111)
Creatinine, Ser: 1.96 mg/dL — ABNORMAL HIGH (ref 0.61–1.24)
GFR calc Af Amer: 35 mL/min — ABNORMAL LOW (ref >60)
GFR calc non Af Amer: 30 mL/min — ABNORMAL LOW (ref >60)
Glucose, Bld: 130 mg/dL — ABNORMAL HIGH (ref 70–99)
Potassium: 4.3 mmol/L (ref 3.5–5.1)
Sodium: 137 mmol/L (ref 135–145)
Total Bilirubin: 0.5 mg/dL (ref 0.3–1.2)
Total Protein: 7.4 g/dL (ref 6.5–8.1)

## 2020-06-20 LAB — CBC
HCT: 39.5 % (ref 39.0–52.0)
Hemoglobin: 13.1 g/dL (ref 13.0–17.0)
MCH: 29.1 pg (ref 26.0–34.0)
MCHC: 33.2 g/dL (ref 30.0–36.0)
MCV: 87.8 fL (ref 80.0–100.0)
Platelets: 178 10*3/uL (ref 150–400)
RBC: 4.5 MIL/uL (ref 4.22–5.81)
RDW: 14.3 % (ref 11.5–15.5)
WBC: 5.7 10*3/uL (ref 4.0–10.5)
nRBC: 0 % (ref 0.0–0.2)

## 2020-06-20 LAB — URINALYSIS, ROUTINE W REFLEX MICROSCOPIC
Bilirubin Urine: NEGATIVE
Glucose, UA: 150 mg/dL — AB
Hgb urine dipstick: NEGATIVE
Ketones, ur: NEGATIVE mg/dL
Leukocytes,Ua: NEGATIVE
Nitrite: NEGATIVE
Protein, ur: NEGATIVE mg/dL
Specific Gravity, Urine: 1.015 (ref 1.005–1.030)
pH: 5 (ref 5.0–8.0)

## 2020-06-20 LAB — PROTIME-INR
INR: 0.9 (ref 0.8–1.2)
Prothrombin Time: 12.1 seconds (ref 11.4–15.2)

## 2020-06-20 LAB — HEMOGLOBIN A1C
Hgb A1c MFr Bld: 6.8 % — ABNORMAL HIGH (ref 4.8–5.6)
Mean Plasma Glucose: 148.46 mg/dL

## 2020-06-20 LAB — SARS CORONAVIRUS 2 (TAT 6-24 HRS): SARS Coronavirus 2: NEGATIVE

## 2020-06-20 LAB — GLUCOSE, CAPILLARY: Glucose-Capillary: 145 mg/dL — ABNORMAL HIGH (ref 70–99)

## 2020-06-20 LAB — SURGICAL PCR SCREEN
MRSA, PCR: NEGATIVE
Staphylococcus aureus: NEGATIVE

## 2020-06-20 NOTE — Pre-Procedure Instructions (Signed)
[id number] - [place], [place] - [address] AT [address] [place] [address] Phone: [phone] Fax: [phone]      Your procedure is scheduled on Friday September 17th.  [facility] at 5:30 A.M., and check in at the Admitting office.  Call this number if you have problems the morning of surgery:  [phone]  Call [phone] if you have any questions prior to your surgery date Monday-Friday 8am-4pm    Remember:  Do not eat or drink anything after midnight the night before your surgery    Take these medicines the morning of surgery with A SIP OF WATER   amLODipine (NORVASC) 10 MG tablet  levothyroxine (SYNTHROID) 50 MCG tablet  omeprazole (PRILOSEC) 20 MG capsule  rosuvastatin (CRESTOR) 40 MG tablet    IF NEEDED  HYDROcodone-acetaminophen (NORCO/VICODIN) 5-325 MG tablet     As of today, STOP taking any Aspirin (unless otherwise instructed by your surgeon) Aleve, Naproxen, Ibuprofen, Motrin, Advil, Goody's, BC's, all herbal medications, fish oil, and all vitamins.           WHAT DO I DO ABOUT MY DIABETES MEDICATION?   Marland Kitchen Do not take oral diabetes medicines (pills) the morning of surgery. glipiZIDE (GLUCOTROL XL)   HOW TO MANAGE YOUR DIABETES BEFORE AND AFTER SURGERY  Why is it important to control my blood sugar before and after surgery? . Improving blood sugar levels before and after surgery helps healing and can limit problems. . A way of improving blood sugar control is eating a healthy diet by: o  Eating less sugar and carbohydrates o  Increasing activity/exercise o  Talking with your doctor about reaching your blood sugar goals . High blood sugars (greater than 180 mg/dL) can raise your risk of infections and slow your recovery, so you will need to focus on controlling your diabetes during the weeks before surgery. . Make sure that the doctor who takes care of your diabetes knows about your  planned surgery including the date and location.  How do I manage my blood sugar before surgery? . Check your blood sugar at least 4 times a day, starting 2 days before surgery, to make sure that the level is not too high or low. . Check your blood sugar the morning of your surgery when you wake up and every 2 hours until you get to the Short Stay unit. o If your blood sugar is less than 70 mg/dL, you will need to treat for low blood sugar: - Do not take insulin. - Treat a low blood sugar (less than 70 mg/dL) with  cup of clear juice (cranberry or apple), 4 glucose tablets, OR glucose gel. - Recheck blood sugar in 15 minutes after treatment (to make sure it is greater than 70 mg/dL). If your blood sugar is not greater than 70 mg/dL on recheck, call [phone] for further instructions. . Report your blood sugar to the short stay nurse when you get to Short Stay.  . If you are admitted to the hospital after surgery: o Your blood sugar will be checked by the staff and you will probably be given insulin after surgery (instead of oral diabetes medicines) to make sure you have good blood sugar levels. o The goal for blood sugar control after surgery is 80-180 mg/dL.             Do not wear jewelry            Do  not wear lotions, powders, colognes, or deodorant.            Men may shave face and neck.            Do not bring valuables to the hospital.            [facility] is not responsible for any belongings or valuables.  Do NOT Smoke (Tobacco/Vaping) or drink Alcohol 24 hours prior to your procedure If you use a CPAP at night, you may bring all equipment for your overnight stay.   Contacts, glasses, dentures or bridgework may not be worn into surgery.      For patients admitted to the hospital, discharge time will be determined by your treatment team.   Patients discharged the day of surgery will not be allowed to drive home, and someone needs to stay with them for 24  hours.    Special instructions:   [facility]- Preparing For Surgery  Before surgery, you can play an important role. Because skin is not sterile, your skin needs to be as free of germs as possible. You can reduce the number of germs on your skin by washing with CHG (chlorahexidine gluconate) Soap before surgery.  CHG is an antiseptic cleaner which kills germs and bonds with the skin to continue killing germs even after washing.    Oral Hygiene is also important to reduce your risk of infection.  Remember - BRUSH YOUR TEETH THE MORNING OF SURGERY WITH YOUR REGULAR TOOTHPASTE  Please do not use if you have an allergy to CHG or antibacterial soaps. If your skin becomes reddened/irritated stop using the CHG.  Do not shave (including legs and underarms) for at least 48 hours prior to first CHG shower. It is OK to shave your face.  Please follow these instructions carefully.   1. Shower the NIGHT BEFORE SURGERY and the MORNING OF SURGERY with CHG Soap.   2. If you chose to wash your hair, wash your hair first as usual with your normal shampoo.  3. After you shampoo, rinse your hair and body thoroughly to remove the shampoo.  4. Use CHG as you would any other liquid soap. You can apply CHG directly to the skin and wash gently with a scrungie or a clean washcloth.   5. Apply the CHG Soap to your body ONLY FROM THE NECK DOWN.  Do not use on open wounds or open sores. Avoid contact with your eyes, ears, mouth and genitals (private parts). Wash Face and genitals (private parts)  with your normal soap.   6. Wash thoroughly, paying special attention to the area where your surgery will be performed.  7. Thoroughly rinse your body with warm water from the neck down.  8. DO NOT shower/wash with your normal soap after using and rinsing off the CHG Soap.  9. Pat yourself dry with a CLEAN TOWEL.  10. Wear CLEAN PAJAMAS to bed the night before surgery  11. Place CLEAN SHEETS on your bed the night of  your first shower and DO NOT SLEEP WITH PETS.   Day of Surgery: Wear Clean/Comfortable clothing the morning of surgery Do not apply any deodorants/lotions.   Remember to brush your teeth WITH YOUR REGULAR TOOTHPASTE.   Please read over the following fact sheets that you were given.

## 2020-06-20 NOTE — Progress Notes (Addendum)
PCP - Celene Squibb Cardiologist - denies Nephrologist - Batoni  Chest x-ray - 06/20/20 EKG - 06/20/20 Stress Test - denies ECHO - denies Cardiac Cath - denies  Fasting Blood Sugar - 110-130 Checks Blood Sugar __2-3___ times a day  Blood Thinner Instructions: denies Aspirin Instructions:denies   COVID TEST- 06/20/20 - patient was tested at [facility]   Anesthesia review: yes  Patient denies shortness of breath, fever, cough and chest pain at PAT appointment   All instructions explained to the patient, with a verbal understanding of the material. Patient agrees to go over the instructions while at home for a better understanding. Patient also instructed to self quarantine after being tested for COVID-19. The opportunity to ask questions was provided.

## 2020-06-21 NOTE — Anesthesia Preprocedure Evaluation (Addendum)
Anesthesia Evaluation  Patient identified by MRN, date of birth, ID band Patient awake    Reviewed: Allergy & Precautions, H&P , NPO status , Patient's Chart, lab work & pertinent test results  Airway Mallampati: II  TM Distance: >3 FB Neck ROM: Full    Dental no notable dental hx. (+) Edentulous Upper, Edentulous Lower, Dental Advisory Given   Pulmonary neg pulmonary ROS, former smoker,  LUL cancer   Pulmonary exam normal breath sounds clear to auscultation       Cardiovascular Exercise Tolerance: Good hypertension, Pt. on medications  Rhythm:Regular Rate:Normal     Neuro/Psych negative neurological ROS  negative psych ROS   GI/Hepatic Neg liver ROS, hiatal hernia, GERD  ,  Endo/Other  diabetes, Type 2, Oral Hypoglycemic AgentsHypothyroidism   Renal/GU Renal InsufficiencyRenal disease  negative genitourinary   Musculoskeletal  (+) Arthritis ,   Abdominal   Peds  Hematology  (+) Blood dyscrasia, anemia ,   Anesthesia Other Findings   Reproductive/Obstetrics negative OB ROS                            Anesthesia Physical Anesthesia Plan  ASA: III  Anesthesia Plan: General   Post-op Pain Management:    Induction: Intravenous  PONV Risk Score and Plan: 3 and Ondansetron, Dexamethasone and Midazolam  Airway Management Planned: Double Lumen EBT  Additional Equipment: Arterial line, CVP and Ultrasound Guidance Line Placement  Intra-op Plan:   Post-operative Plan: Extubation in OR and Possible Post-op intubation/ventilation  Informed Consent: I have reviewed the patients History and Physical, chart, labs and discussed the procedure including the risks, benefits and alternatives for the proposed anesthesia with the patient or authorized representative who has indicated his/her understanding and acceptance.     Dental advisory given  Plan Discussed with: CRNA  Anesthesia Plan  Comments:        Anesthesia Quick Evaluation

## 2020-06-22 ENCOUNTER — Other Ambulatory Visit: Payer: Self-pay

## 2020-06-22 ENCOUNTER — Inpatient Hospital Stay (HOSPITAL_COMMUNITY): Payer: Medicare HMO | Admitting: Physician Assistant

## 2020-06-22 ENCOUNTER — Inpatient Hospital Stay (HOSPITAL_COMMUNITY): Payer: Medicare HMO

## 2020-06-22 ENCOUNTER — Inpatient Hospital Stay (HOSPITAL_COMMUNITY): Payer: Medicare HMO | Admitting: Certified Registered Nurse Anesthetist

## 2020-06-22 ENCOUNTER — Encounter (HOSPITAL_COMMUNITY): Payer: Self-pay | Admitting: Thoracic Surgery (Cardiothoracic Vascular Surgery)

## 2020-06-22 ENCOUNTER — Inpatient Hospital Stay (HOSPITAL_COMMUNITY)
Admission: RE | Admit: 2020-06-22 | Discharge: 2020-06-26 | DRG: 164 | Disposition: A | Discharge status: Home / Self Care | Payer: Medicare HMO | Attending: Thoracic Surgery (Cardiothoracic Vascular Surgery) | Admitting: Thoracic Surgery (Cardiothoracic Vascular Surgery)

## 2020-06-22 ENCOUNTER — Encounter (HOSPITAL_COMMUNITY)
Admission: RE | Disposition: A | Discharge status: Home / Self Care | Payer: Self-pay | Source: Home / Self Care | Attending: Thoracic Surgery (Cardiothoracic Vascular Surgery)

## 2020-06-22 DIAGNOSIS — K449 Diaphragmatic hernia without obstruction or gangrene: Secondary | ICD-10-CM | POA: Diagnosis present

## 2020-06-22 DIAGNOSIS — D472 Monoclonal gammopathy: Secondary | ICD-10-CM | POA: Diagnosis present

## 2020-06-22 DIAGNOSIS — N289 Disorder of kidney and ureter, unspecified: Secondary | ICD-10-CM | POA: Diagnosis not present

## 2020-06-22 DIAGNOSIS — N184 Chronic kidney disease, stage 4 (severe): Secondary | ICD-10-CM | POA: Diagnosis present

## 2020-06-22 DIAGNOSIS — C3492 Malignant neoplasm of unspecified part of left bronchus or lung: Secondary | ICD-10-CM

## 2020-06-22 DIAGNOSIS — E1122 Type 2 diabetes mellitus with diabetic chronic kidney disease: Secondary | ICD-10-CM | POA: Diagnosis present

## 2020-06-22 DIAGNOSIS — I129 Hypertensive chronic kidney disease with stage 1 through stage 4 chronic kidney disease, or unspecified chronic kidney disease: Secondary | ICD-10-CM | POA: Diagnosis present

## 2020-06-22 DIAGNOSIS — D62 Acute posthemorrhagic anemia: Secondary | ICD-10-CM | POA: Diagnosis not present

## 2020-06-22 DIAGNOSIS — R339 Retention of urine, unspecified: Secondary | ICD-10-CM | POA: Diagnosis not present

## 2020-06-22 DIAGNOSIS — J432 Centrilobular emphysema: Secondary | ICD-10-CM | POA: Diagnosis present

## 2020-06-22 DIAGNOSIS — R911 Solitary pulmonary nodule: Secondary | ICD-10-CM | POA: Diagnosis present

## 2020-06-22 DIAGNOSIS — K219 Gastro-esophageal reflux disease without esophagitis: Secondary | ICD-10-CM | POA: Diagnosis present

## 2020-06-22 DIAGNOSIS — C3412 Malignant neoplasm of upper lobe, left bronchus or lung: Secondary | ICD-10-CM

## 2020-06-22 DIAGNOSIS — M5432 Sciatica, left side: Secondary | ICD-10-CM | POA: Diagnosis present

## 2020-06-22 DIAGNOSIS — Z7984 Long term (current) use of oral hypoglycemic drugs: Secondary | ICD-10-CM

## 2020-06-22 DIAGNOSIS — K5900 Constipation, unspecified: Secondary | ICD-10-CM | POA: Diagnosis not present

## 2020-06-22 DIAGNOSIS — E039 Hypothyroidism, unspecified: Secondary | ICD-10-CM | POA: Diagnosis present

## 2020-06-22 DIAGNOSIS — Z79890 Hormone replacement therapy (postmenopausal): Secondary | ICD-10-CM

## 2020-06-22 DIAGNOSIS — Z79899 Other long term (current) drug therapy: Secondary | ICD-10-CM

## 2020-06-22 DIAGNOSIS — Z902 Acquired absence of lung [part of]: Secondary | ICD-10-CM

## 2020-06-22 DIAGNOSIS — J9382 Other air leak: Secondary | ICD-10-CM | POA: Diagnosis not present

## 2020-06-22 DIAGNOSIS — Z87891 Personal history of nicotine dependence: Secondary | ICD-10-CM

## 2020-06-22 DIAGNOSIS — J939 Pneumothorax, unspecified: Secondary | ICD-10-CM

## 2020-06-22 DIAGNOSIS — E785 Hyperlipidemia, unspecified: Secondary | ICD-10-CM | POA: Diagnosis present

## 2020-06-22 DIAGNOSIS — Z4682 Encounter for fitting and adjustment of non-vascular catheter: Secondary | ICD-10-CM

## 2020-06-22 DIAGNOSIS — Z20822 Contact with and (suspected) exposure to covid-19: Secondary | ICD-10-CM | POA: Diagnosis present

## 2020-06-22 DIAGNOSIS — Z9689 Presence of other specified functional implants: Secondary | ICD-10-CM

## 2020-06-22 DIAGNOSIS — Z9889 Other specified postprocedural states: Secondary | ICD-10-CM

## 2020-06-22 HISTORY — PX: INTERCOSTAL NERVE BLOCK: SHX5021

## 2020-06-22 HISTORY — PX: NODE DISSECTION: SHX5269

## 2020-06-22 LAB — POCT I-STAT 7, (LYTES, BLD GAS, ICA,H+H)
Acid-Base Excess: 2 mmol/L (ref 0.0–2.0)
Bicarbonate: 28.6 mmol/L — ABNORMAL HIGH (ref 20.0–28.0)
Calcium, Ion: 1.23 mmol/L (ref 1.15–1.40)
HCT: 32 % — ABNORMAL LOW (ref 39.0–52.0)
Hemoglobin: 10.9 g/dL — ABNORMAL LOW (ref 13.0–17.0)
O2 Saturation: 100 %
Patient temperature: 34.6
Potassium: 3.9 mmol/L (ref 3.5–5.1)
Sodium: 139 mmol/L (ref 135–145)
TCO2: 30 mmol/L (ref 22–32)
pCO2 arterial: 49.3 mmHg — ABNORMAL HIGH (ref 32.0–48.0)
pH, Arterial: 7.36 (ref 7.350–7.450)
pO2, Arterial: 536 mmHg — ABNORMAL HIGH (ref 83.0–108.0)

## 2020-06-22 LAB — GLUCOSE, CAPILLARY
Glucose-Capillary: 104 mg/dL — ABNORMAL HIGH (ref 70–99)
Glucose-Capillary: 157 mg/dL — ABNORMAL HIGH (ref 70–99)
Glucose-Capillary: 189 mg/dL — ABNORMAL HIGH (ref 70–99)
Glucose-Capillary: 250 mg/dL — ABNORMAL HIGH (ref 70–99)
Glucose-Capillary: 150 mg/dL — ABNORMAL HIGH (ref 70–99)

## 2020-06-22 LAB — POCT I-STAT, CHEM 8
BUN: 25 mg/dL — ABNORMAL HIGH (ref 8–23)
Calcium, Ion: 1.27 mmol/L (ref 1.15–1.40)
Chloride: 104 mmol/L (ref 98–111)
Creatinine, Ser: 1.8 mg/dL — ABNORMAL HIGH (ref 0.61–1.24)
Glucose, Bld: 186 mg/dL — ABNORMAL HIGH (ref 70–99)
HCT: 33 % — ABNORMAL LOW (ref 39.0–52.0)
Hemoglobin: 11.2 g/dL — ABNORMAL LOW (ref 13.0–17.0)
Potassium: 4.2 mmol/L (ref 3.5–5.1)
Sodium: 138 mmol/L (ref 135–145)
TCO2: 24 mmol/L (ref 22–32)

## 2020-06-22 LAB — ABO/RH: ABO/RH(D): A POS

## 2020-06-22 LAB — PREPARE RBC (CROSSMATCH)

## 2020-06-22 SURGERY — LOBECTOMY, LUNG, ROBOT-ASSISTED, USING VATS
Anesthesia: General | Site: Chest | Laterality: Left

## 2020-06-22 MED ORDER — GLYCOPYRROLATE PF 0.2 MG/ML IJ SOSY
PREFILLED_SYRINGE | INTRAMUSCULAR | Status: DC | PRN
  Administered 2020-06-22: .1 mg via INTRAVENOUS

## 2020-06-22 MED ORDER — HEMOSTATIC AGENTS (NO CHARGE) OPTIME
TOPICAL | Status: DC | PRN
  Administered 2020-06-22: 2 via TOPICAL

## 2020-06-22 MED ORDER — EPHEDRINE 5 MG/ML INJ
INTRAVENOUS | Status: AC
  Filled 2020-06-22: qty 10

## 2020-06-22 MED ORDER — HYDROMORPHONE HCL 1 MG/ML IJ SOLN
INTRAMUSCULAR | Status: AC
  Filled 2020-06-22: qty 1

## 2020-06-22 MED ORDER — DEXAMETHASONE SODIUM PHOSPHATE 10 MG/ML IJ SOLN
INTRAMUSCULAR | Status: AC
  Filled 2020-06-22: qty 1

## 2020-06-22 MED ORDER — CHLORHEXIDINE GLUCONATE 0.12 % MT SOLN
15.0000 mL | Freq: Once | OROMUCOSAL | Status: AC
  Administered 2020-06-22: 15 mL via OROMUCOSAL
  Filled 2020-06-22: qty 15

## 2020-06-22 MED ORDER — BUPIVACAINE HCL (PF) 0.5 % IJ SOLN
INTRAMUSCULAR | Status: AC
  Filled 2020-06-22: qty 30

## 2020-06-22 MED ORDER — EPHEDRINE SULFATE-NACL 50-0.9 MG/10ML-% IV SOSY
PREFILLED_SYRINGE | INTRAVENOUS | Status: DC | PRN
  Administered 2020-06-22: 10 mg via INTRAVENOUS
  Administered 2020-06-22: 5 mg via INTRAVENOUS

## 2020-06-22 MED ORDER — SUCCINYLCHOLINE CHLORIDE 200 MG/10ML IV SOSY
PREFILLED_SYRINGE | INTRAVENOUS | Status: AC
  Filled 2020-06-22: qty 10

## 2020-06-22 MED ORDER — HYDROMORPHONE HCL 1 MG/ML IJ SOLN
0.2500 mg | INTRAMUSCULAR | Status: DC | PRN
  Administered 2020-06-22: 0.25 mg via INTRAVENOUS
  Administered 2020-06-22: 0.5 mg via INTRAVENOUS
  Administered 2020-06-22: 0.25 mg via INTRAVENOUS

## 2020-06-22 MED ORDER — PROPOFOL 10 MG/ML IV BOLUS
INTRAVENOUS | Status: DC | PRN
  Administered 2020-06-22: 100 mg via INTRAVENOUS

## 2020-06-22 MED ORDER — INSULIN ASPART 100 UNIT/ML ~~LOC~~ SOLN
0.0000 [IU] | Freq: Three times a day (TID) | SUBCUTANEOUS | Status: DC
  Administered 2020-06-22: 8 [IU] via SUBCUTANEOUS
  Administered 2020-06-23 – 2020-06-24 (×3): 2 [IU] via SUBCUTANEOUS
  Administered 2020-06-24 – 2020-06-25 (×3): 4 [IU] via SUBCUTANEOUS
  Administered 2020-06-25 – 2020-06-26 (×3): 2 [IU] via SUBCUTANEOUS
  Administered 2020-06-26: 4 [IU] via SUBCUTANEOUS

## 2020-06-22 MED ORDER — ACETAMINOPHEN 500 MG PO TABS
1000.0000 mg | ORAL_TABLET | Freq: Once | ORAL | Status: AC
  Administered 2020-06-22: 1000 mg via ORAL
  Filled 2020-06-22: qty 2

## 2020-06-22 MED ORDER — ONDANSETRON HCL 4 MG/2ML IJ SOLN
INTRAMUSCULAR | Status: DC | PRN
  Administered 2020-06-22: 4 mg via INTRAVENOUS

## 2020-06-22 MED ORDER — SUGAMMADEX SODIUM 200 MG/2ML IV SOLN
INTRAVENOUS | Status: DC | PRN
  Administered 2020-06-22 (×2): 100 mg via INTRAVENOUS

## 2020-06-22 MED ORDER — ACETAMINOPHEN 500 MG PO TABS
1000.0000 mg | ORAL_TABLET | Freq: Four times a day (QID) | ORAL | Status: DC
  Administered 2020-06-22 – 2020-06-26 (×11): 1000 mg via ORAL
  Filled 2020-06-22 (×11): qty 2

## 2020-06-22 MED ORDER — PHENYLEPHRINE 40 MCG/ML (10ML) SYRINGE FOR IV PUSH (FOR BLOOD PRESSURE SUPPORT)
PREFILLED_SYRINGE | INTRAVENOUS | Status: AC
  Filled 2020-06-22: qty 10

## 2020-06-22 MED ORDER — ALBUMIN HUMAN 5 % IV SOLN
12.5000 g | Freq: Once | INTRAVENOUS | Status: AC
  Administered 2020-06-22: 12.5 g via INTRAVENOUS

## 2020-06-22 MED ORDER — ALBUTEROL SULFATE HFA 108 (90 BASE) MCG/ACT IN AERS
INHALATION_SPRAY | RESPIRATORY_TRACT | Status: AC
  Filled 2020-06-22: qty 6.7

## 2020-06-22 MED ORDER — LACTATED RINGERS IV SOLN: INTRAVENOUS | Status: DC | PRN

## 2020-06-22 MED ORDER — CEFAZOLIN SODIUM-DEXTROSE 2-4 GM/100ML-% IV SOLN
2.0000 g | Freq: Three times a day (TID) | INTRAVENOUS | Status: AC
  Administered 2020-06-22 (×2): 2 g via INTRAVENOUS
  Filled 2020-06-22 (×2): qty 100

## 2020-06-22 MED ORDER — LIDOCAINE 2% (20 MG/ML) 5 ML SYRINGE
INTRAMUSCULAR | Status: AC
  Filled 2020-06-22: qty 5

## 2020-06-22 MED ORDER — MIDAZOLAM HCL 2 MG/2ML IJ SOLN
INTRAMUSCULAR | Status: AC
  Filled 2020-06-22: qty 2

## 2020-06-22 MED ORDER — KETOROLAC TROMETHAMINE 15 MG/ML IJ SOLN: 15.0000 mg | Freq: Four times a day (QID) | INTRAMUSCULAR | Status: DC

## 2020-06-22 MED ORDER — LEVOTHYROXINE SODIUM 50 MCG PO TABS
50.0000 ug | ORAL_TABLET | Freq: Every day | ORAL | Status: DC
  Administered 2020-06-23 – 2020-06-26 (×4): 50 ug via ORAL
  Filled 2020-06-22 (×4): qty 1

## 2020-06-22 MED ORDER — ROSUVASTATIN CALCIUM 20 MG PO TABS
40.0000 mg | ORAL_TABLET | Freq: Every day | ORAL | Status: DC
  Administered 2020-06-23 – 2020-06-26 (×4): 40 mg via ORAL
  Filled 2020-06-22 (×5): qty 2

## 2020-06-22 MED ORDER — ALBUMIN HUMAN 5 % IV SOLN
INTRAVENOUS | Status: AC
  Filled 2020-06-22: qty 250

## 2020-06-22 MED ORDER — ROCURONIUM BROMIDE 100 MG/10ML IV SOLN
INTRAVENOUS | Status: DC | PRN
  Administered 2020-06-22: 80 mg via INTRAVENOUS
  Administered 2020-06-22: 10 mg via INTRAVENOUS

## 2020-06-22 MED ORDER — SODIUM CHLORIDE FLUSH 0.9 % IV SOLN
INTRAVENOUS | Status: DC | PRN
  Administered 2020-06-22: 95 mL

## 2020-06-22 MED ORDER — ESMOLOL HCL 100 MG/10ML IV SOLN
INTRAVENOUS | Status: AC
  Filled 2020-06-22: qty 10

## 2020-06-22 MED ORDER — SODIUM CHLORIDE 0.9 % IV SOLN: INTRAVENOUS | Status: DC

## 2020-06-22 MED ORDER — PROPOFOL 10 MG/ML IV BOLUS
INTRAVENOUS | Status: AC
  Filled 2020-06-22: qty 20

## 2020-06-22 MED ORDER — CEFAZOLIN SODIUM-DEXTROSE 2-3 GM-%(50ML) IV SOLR
INTRAVENOUS | Status: DC | PRN
  Administered 2020-06-22: 2 g via INTRAVENOUS

## 2020-06-22 MED ORDER — TRAMADOL HCL 50 MG PO TABS
50.0000 mg | ORAL_TABLET | Freq: Four times a day (QID) | ORAL | Status: DC | PRN
  Administered 2020-06-22 – 2020-06-23 (×2): 100 mg via ORAL
  Administered 2020-06-23: 50 mg via ORAL
  Administered 2020-06-24 – 2020-06-25 (×4): 100 mg via ORAL
  Filled 2020-06-22 (×6): qty 2

## 2020-06-22 MED ORDER — 0.9 % SODIUM CHLORIDE (POUR BTL) OPTIME
TOPICAL | Status: DC | PRN
  Administered 2020-06-22: 2000 mL

## 2020-06-22 MED ORDER — ACETAMINOPHEN 160 MG/5ML PO SOLN: 1000.0000 mg | Freq: Four times a day (QID) | ORAL | Status: DC

## 2020-06-22 MED ORDER — ENOXAPARIN SODIUM 40 MG/0.4ML ~~LOC~~ SOLN
40.0000 mg | Freq: Every day | SUBCUTANEOUS | Status: DC
  Administered 2020-06-23 – 2020-06-26 (×4): 40 mg via SUBCUTANEOUS
  Filled 2020-06-22 (×4): qty 0.4

## 2020-06-22 MED ORDER — SODIUM CHLORIDE 0.9% IV SOLUTION
INTRAVENOUS | Status: AC | PRN
  Administered 2020-06-22: 1000 mL

## 2020-06-22 MED ORDER — ROCURONIUM BROMIDE 10 MG/ML (PF) SYRINGE
PREFILLED_SYRINGE | INTRAVENOUS | Status: AC
  Filled 2020-06-22: qty 10

## 2020-06-22 MED ORDER — SENNOSIDES-DOCUSATE SODIUM 8.6-50 MG PO TABS
1.0000 | ORAL_TABLET | Freq: Every day | ORAL | Status: DC
  Administered 2020-06-22 – 2020-06-25 (×4): 1 via ORAL
  Filled 2020-06-22 (×4): qty 1

## 2020-06-22 MED ORDER — LACTATED RINGERS IV SOLN: INTRAVENOUS | Status: DC

## 2020-06-22 MED ORDER — ORAL CARE MOUTH RINSE: 15.0000 mL | Freq: Once | OROMUCOSAL | Status: AC

## 2020-06-22 MED ORDER — FENTANYL CITRATE (PF) 250 MCG/5ML IJ SOLN
INTRAMUSCULAR | Status: DC | PRN
Start: 2020-06-22 — End: 2020-06-22
  Administered 2020-06-22: 50 ug via INTRAVENOUS
  Administered 2020-06-22 (×2): 25 ug via INTRAVENOUS
  Administered 2020-06-22 (×2): 50 ug via INTRAVENOUS

## 2020-06-22 MED ORDER — BUPIVACAINE LIPOSOME 1.3 % IJ SUSP
20.0000 mL | Freq: Once | INTRAMUSCULAR | Status: DC
  Filled 2020-06-22: qty 20

## 2020-06-22 MED ORDER — DEXAMETHASONE SODIUM PHOSPHATE 10 MG/ML IJ SOLN
INTRAMUSCULAR | Status: DC | PRN
  Administered 2020-06-22: 5 mg via INTRAVENOUS

## 2020-06-22 MED ORDER — BISACODYL 5 MG PO TBEC
10.0000 mg | DELAYED_RELEASE_TABLET | Freq: Every day | ORAL | Status: DC
  Administered 2020-06-23 – 2020-06-25 (×3): 10 mg via ORAL
  Filled 2020-06-22 (×4): qty 2

## 2020-06-22 MED ORDER — SODIUM CHLORIDE 0.9% IV SOLUTION: Freq: Once | INTRAVENOUS | Status: DC

## 2020-06-22 MED ORDER — MIDAZOLAM HCL 5 MG/5ML IJ SOLN
INTRAMUSCULAR | Status: DC | PRN
  Administered 2020-06-22 (×2): .5 mg via INTRAVENOUS

## 2020-06-22 MED ORDER — PHENYLEPHRINE 40 MCG/ML (10ML) SYRINGE FOR IV PUSH (FOR BLOOD PRESSURE SUPPORT)
PREFILLED_SYRINGE | INTRAVENOUS | Status: DC | PRN
  Administered 2020-06-22: 80 ug via INTRAVENOUS

## 2020-06-22 MED ORDER — CEFAZOLIN SODIUM-DEXTROSE 2-4 GM/100ML-% IV SOLN
2.0000 g | INTRAVENOUS | Status: DC
  Filled 2020-06-22: qty 100

## 2020-06-22 MED ORDER — ONDANSETRON HCL 4 MG/2ML IJ SOLN
INTRAMUSCULAR | Status: AC
  Filled 2020-06-22: qty 2

## 2020-06-22 MED ORDER — FENTANYL CITRATE (PF) 250 MCG/5ML IJ SOLN
INTRAMUSCULAR | Status: AC
  Filled 2020-06-22: qty 5

## 2020-06-22 MED ORDER — CHLORHEXIDINE GLUCONATE CLOTH 2 % EX PADS: 6.0000 | MEDICATED_PAD | Freq: Every day | CUTANEOUS | Status: DC

## 2020-06-22 MED ORDER — PHENYLEPHRINE HCL-NACL 10-0.9 MG/250ML-% IV SOLN
INTRAVENOUS | Status: DC | PRN
  Administered 2020-06-22: 30 ug/min via INTRAVENOUS

## 2020-06-22 MED ORDER — AMLODIPINE BESYLATE 10 MG PO TABS
10.0000 mg | ORAL_TABLET | Freq: Every day | ORAL | Status: DC
  Administered 2020-06-23 – 2020-06-26 (×4): 10 mg via ORAL
  Filled 2020-06-22 (×5): qty 1

## 2020-06-22 MED ORDER — ONDANSETRON HCL 4 MG/2ML IJ SOLN: 4.0000 mg | Freq: Four times a day (QID) | INTRAMUSCULAR | Status: DC | PRN

## 2020-06-22 SURGICAL SUPPLY — 120 items
APPLIER CLIP ROT 10 11.4 M/L (STAPLE)
BIT DRILL 1.7 LOW PROFILE (BIT) ×2 IMPLANT
BLADE CLIPPER SURG (BLADE) IMPLANT
BLADE SURG SZ11 CARB STEEL (BLADE) ×2 IMPLANT
BNDG COHESIVE 6X5 TAN STRL LF (GAUZE/BANDAGES/DRESSINGS) IMPLANT
CANISTER SUCT 3000ML PPV (MISCELLANEOUS) ×4 IMPLANT
CANNULA REDUC XI 12-8 STAPL (CANNULA) ×4
CANNULA REDUCER 12-8 DVNC XI (CANNULA) ×2 IMPLANT
CATH THORACIC 28FR (CATHETERS) IMPLANT
CATH THORACIC 28FR RT ANG (CATHETERS) IMPLANT
CATH THORACIC 36FR (CATHETERS) IMPLANT
CATH THORACIC 36FR RT ANG (CATHETERS) IMPLANT
CLIP APPLIE ROT 10 11.4 M/L (STAPLE) IMPLANT
CLIP VESOCCLUDE MED 6/CT (CLIP) IMPLANT
CNTNR URN SCR LID CUP LEK RST (MISCELLANEOUS) ×15 IMPLANT
CONN ST 1/4X3/8  BEN (MISCELLANEOUS) ×2
CONN ST 1/4X3/8 BEN (MISCELLANEOUS) ×1 IMPLANT
CONN Y 3/8X3/8X3/8  BEN (MISCELLANEOUS)
CONN Y 3/8X3/8X3/8 BEN (MISCELLANEOUS) IMPLANT
CONT SPEC 4OZ STRL OR WHT (MISCELLANEOUS) ×30
DEFOGGER SCOPE WARMER CLEARIFY (MISCELLANEOUS) ×2 IMPLANT
DERMABOND ADVANCED (GAUZE/BANDAGES/DRESSINGS) ×1
DERMABOND ADVANCED .7 DNX12 (GAUZE/BANDAGES/DRESSINGS) ×1 IMPLANT
DRAIN CHANNEL 28F RND 3/8 FF (WOUND CARE) IMPLANT
DRAIN CHANNEL 32F RND 10.7 FF (WOUND CARE) IMPLANT
DRAPE ARM DVNC X/XI (DISPOSABLE) ×4 IMPLANT
DRAPE COLUMN DVNC XI (DISPOSABLE) ×1 IMPLANT
DRAPE CV SPLIT W-CLR ANES SCRN (DRAPES) ×2 IMPLANT
DRAPE DA VINCI XI ARM (DISPOSABLE) ×8
DRAPE DA VINCI XI COLUMN (DISPOSABLE) ×2
DRAPE HALF SHEET 40X57 (DRAPES) ×2 IMPLANT
DRAPE INCISE IOBAN 66X45 STRL (DRAPES) ×2 IMPLANT
DRAPE ORTHO SPLIT 77X108 STRL (DRAPES) ×2
DRAPE SURG ORHT 6 SPLT 77X108 (DRAPES) ×1 IMPLANT
ELECT BLADE 6.5 EXT (BLADE) ×2 IMPLANT
ELECT REM PT RETURN 9FT ADLT (ELECTROSURGICAL) ×2
ELECTRODE REM PT RTRN 9FT ADLT (ELECTROSURGICAL) ×1 IMPLANT
GAUZE KITTNER 4X5 RF (MISCELLANEOUS) ×6 IMPLANT
GAUZE SPONGE 4X4 12PLY STRL (GAUZE/BANDAGES/DRESSINGS) ×2 IMPLANT
GLOVE BIO SURGEON STRL SZ 6.5 (GLOVE) ×2 IMPLANT
GLOVE BIO SURGEON STRL SZ7.5 (GLOVE) ×2 IMPLANT
GLOVE BIOGEL PI IND STRL 6.5 (GLOVE) ×3 IMPLANT
GLOVE BIOGEL PI INDICATOR 6.5 (GLOVE) ×3
GLOVE SURG SS PI 6.0 STRL IVOR (GLOVE) ×2 IMPLANT
GLOVE SURG SYN 7.5  E (GLOVE) ×4
GLOVE SURG SYN 7.5 E (GLOVE) ×2 IMPLANT
GLOVE TRIUMPH SURG SIZE 7.5 (KITS) IMPLANT
GOWN STRL REUS W/ TWL LRG LVL3 (GOWN DISPOSABLE) ×5 IMPLANT
GOWN STRL REUS W/ TWL XL LVL3 (GOWN DISPOSABLE) ×2 IMPLANT
GOWN STRL REUS W/TWL 2XL LVL3 (GOWN DISPOSABLE) ×2 IMPLANT
GOWN STRL REUS W/TWL LRG LVL3 (GOWN DISPOSABLE) ×10
GOWN STRL REUS W/TWL XL LVL3 (GOWN DISPOSABLE) ×4
HEMOSTAT SURGICEL 2X14 (HEMOSTASIS) ×4 IMPLANT
IRRIGATION STRYKERFLOW (MISCELLANEOUS) ×1 IMPLANT
IRRIGATOR STRYKERFLOW (MISCELLANEOUS) ×2
KIT BASIN OR (CUSTOM PROCEDURE TRAY) ×2 IMPLANT
KIT SUCTION CATH 14FR (SUCTIONS) IMPLANT
KIT TURNOVER KIT B (KITS) ×2 IMPLANT
LOOP VESSEL SUPERMAXI WHITE (MISCELLANEOUS) IMPLANT
NEEDLE HYPO 25GX1X1/2 BEV (NEEDLE) ×2 IMPLANT
NEEDLE SPNL 22GX3.5 QUINCKE BK (NEEDLE) ×2 IMPLANT
NS IRRIG 1000ML POUR BTL (IV SOLUTION) ×4 IMPLANT
PACK CHEST (CUSTOM PROCEDURE TRAY) ×2 IMPLANT
PAD ARMBOARD 7.5X6 YLW CONV (MISCELLANEOUS) ×4 IMPLANT
PORT ACCESS TROCAR AIRSEAL 12 (TROCAR) ×1 IMPLANT
PORT ACCESS TROCAR AIRSEAL 5M (TROCAR) ×1
RELOAD STAPLER 2.5X45 WHT DVNC (STAPLE) ×2 IMPLANT
RELOAD STAPLER 3.5X45 BLU DVNC (STAPLE) ×5 IMPLANT
SCISSORS LAP 5X35 DISP (ENDOMECHANICALS) IMPLANT
SEAL CANN UNIV 5-8 DVNC XI (MISCELLANEOUS) ×2 IMPLANT
SEAL XI 5MM-8MM UNIVERSAL (MISCELLANEOUS) ×4
SEALANT PROGEL (MISCELLANEOUS) IMPLANT
SEALANT SURG COSEAL 4ML (VASCULAR PRODUCTS) IMPLANT
SEALANT SURG COSEAL 8ML (VASCULAR PRODUCTS) IMPLANT
SET TRI-LUMEN FLTR TB AIRSEAL (TUBING) ×2 IMPLANT
SHEARS HARMONIC HDI 20CM (ELECTROSURGICAL) IMPLANT
SOLUTION ELECTROLUBE (MISCELLANEOUS) ×2 IMPLANT
SPECIMEN JAR MEDIUM (MISCELLANEOUS) IMPLANT
SPONGE INTESTINAL PEANUT (DISPOSABLE) IMPLANT
SPONGE TONSIL TAPE 1 RFD (DISPOSABLE) IMPLANT
STAPLE RELOAD 45 2.0 GRAY (STAPLE) ×2
STAPLE RELOAD 45 2.0 GRAY DVNC (STAPLE) ×1 IMPLANT
STAPLER 45 SUREFORM CVD (STAPLE) ×2
STAPLER 45 SUREFORM CVD DVNC (STAPLE) ×1 IMPLANT
STAPLER CANNULA SEAL DVNC XI (STAPLE) ×2 IMPLANT
STAPLER CANNULA SEAL XI (STAPLE) ×4
STAPLER RELOAD 2.5X45 WHITE (STAPLE) ×4
STAPLER RELOAD 2.5X45 WHT DVNC (STAPLE) ×2
STAPLER RELOAD 3.5X45 BLU DVNC (STAPLE) ×5
STAPLER RELOAD 3.5X45 BLUE (STAPLE) ×10
SUT PDS AB 3-0 SH 27 (SUTURE) IMPLANT
SUT PROLENE 4 0 RB 1 (SUTURE)
SUT PROLENE 4-0 RB1 .5 CRCL 36 (SUTURE) IMPLANT
SUT SILK  1 MH (SUTURE) ×2
SUT SILK 1 MH (SUTURE) ×1 IMPLANT
SUT SILK 1 TIES 10X30 (SUTURE) IMPLANT
SUT SILK 2 0 SH (SUTURE) IMPLANT
SUT SILK 2 0SH CR/8 30 (SUTURE) ×2 IMPLANT
SUT SILK 3 0 SH 30 (SUTURE) IMPLANT
SUT SILK 3 0SH CR/8 30 (SUTURE) IMPLANT
SUT VIC AB 1 CTX 36 (SUTURE)
SUT VIC AB 1 CTX36XBRD ANBCTR (SUTURE) IMPLANT
SUT VIC AB 2-0 CTX 36 (SUTURE) ×2 IMPLANT
SUT VIC AB 3-0 MH 27 (SUTURE) IMPLANT
SUT VIC AB 3-0 X1 27 (SUTURE) ×4 IMPLANT
SUT VICRYL 0 TIES 12 18 (SUTURE) ×2 IMPLANT
SUT VICRYL 0 UR6 27IN ABS (SUTURE) ×4 IMPLANT
SUT VICRYL 2 TP 1 (SUTURE) IMPLANT
SYR 20ML ECCENTRIC (SYRINGE) IMPLANT
SYR 30ML LL (SYRINGE) ×4 IMPLANT
SYSTEM RETRIEVAL ANCHOR 12 (MISCELLANEOUS) ×2 IMPLANT
SYSTEM SAHARA CHEST DRAIN ATS (WOUND CARE) ×2 IMPLANT
TAPE CLOTH 4X10 WHT NS (GAUZE/BANDAGES/DRESSINGS) ×2 IMPLANT
TAPE CLOTH SURG 4X10 WHT LF (GAUZE/BANDAGES/DRESSINGS) ×2 IMPLANT
TIP APPLICATOR SPRAY EXTEND 16 (VASCULAR PRODUCTS) IMPLANT
TOWEL GREEN STERILE (TOWEL DISPOSABLE) ×2 IMPLANT
TRAY FOLEY MTR SLVR 16FR STAT (SET/KITS/TRAYS/PACK) ×2 IMPLANT
TROCAR BLADELESS 15MM (ENDOMECHANICALS) IMPLANT
TROCAR XCEL 12X100 BLDLESS (ENDOMECHANICALS) IMPLANT
WATER STERILE IRR 1000ML POUR (IV SOLUTION) ×2 IMPLANT

## 2020-06-22 NOTE — Anesthesia Procedure Notes (Signed)
Central Venous Catheter Insertion Performed by: Suzette Battiest, MD, anesthesiologist Start/End9/17/2021 7:00 AM, 06/22/2020 7:15 AM Patient location: Pre-op. Preanesthetic checklist: patient identified, IV checked, site marked, risks and benefits discussed, surgical consent, monitors and equipment checked, pre-op evaluation, timeout performed and anesthesia consent Position: Trendelenburg Lidocaine 1% used for infiltration and patient sedated Hand hygiene performed , maximum sterile barriers used  and Seldinger technique used Catheter size: 8 Fr Total catheter length 16. Central line was placed.Double lumen Procedure performed using ultrasound guided technique. Ultrasound Notes:anatomy identified, needle tip was noted to be adjacent to the nerve/plexus identified, no ultrasound evidence of intravascular and/or intraneural injection and image(s) printed for medical record Attempts: 1 Following insertion, dressing applied, line sutured and Biopatch. Post procedure assessment: blood return through all ports  Patient tolerated the procedure well with no immediate complications.

## 2020-06-22 NOTE — Brief Op Note (Addendum)
06/22/2020  11:17 AM  PATIENT:  William Wells  86 y.o. 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   Delay start of Pharmacological VTE agent (>24hrs) due to surgical blood loss or risk of bleeding: no

## 2020-06-22 NOTE — Anesthesia Procedure Notes (Signed)
Arterial Line Insertion Start/End9/17/2021 6:50 AM, 06/22/2020 6:56 AM Performed by: Janene Harvey, CRNA  Patient location: Pre-op. Preanesthetic checklist: patient identified, IV checked and risks and benefits discussed Lidocaine 1% used for infiltration and patient sedated Left, radial was placed Catheter size: 20 G Hand hygiene performed  and maximum sterile barriers used  Allen's test indicative of satisfactory collateral circulation Attempts: 1 Procedure performed without using ultrasound guided technique. Following insertion, dressing applied and Biopatch. Patient tolerated the procedure well with no immediate complications. Additional procedure comments: Aline placed by Meyer Russel.

## 2020-06-22 NOTE — Interval H&P Note (Signed)
History and Physical Interval Note:  06/22/2020 7:20 AM  William Wells  has presented today for surgery, with the diagnosis of LUL ADENOCARCINOMA.  The various methods of treatment have been discussed with the patient and family. After consideration of risks, benefits and other options for treatment, the patient has consented to  Procedure(s): XI ROBOTIC ASSISTED THORASCOPY-LEFT UPPER LOBECTOMY (Left) as a surgical intervention.  The patient's history has been reviewed, patient examined, no change in status, stable for surgery.  I have reviewed the patient's chart and labs.  Questions were answered to the patient's satisfaction.     Melrose Nakayama

## 2020-06-22 NOTE — Transfer of Care (Signed)
Immediate Anesthesia Transfer of Care Note  Patient: William Wells  Procedure(s) Performed: XI ROBOTIC ASSISTED THORASCOPY-LEFT UPPER LOBECTOMY (Left Chest) INTERCOSTAL NERVE BLOCK (Left Chest) NODE DISSECTION (Left Chest)  Patient Location: PACU  Anesthesia Type:General  Level of Consciousness: awake  Airway & Oxygen Therapy: Patient Spontanous Breathing and Patient connected to face mask oxygen  Post-op Assessment: Report given to RN and Post -op Vital signs reviewed and stable  Post vital signs: Reviewed  Last Vitals:  Vitals Value Taken Time  BP    Temp    Pulse 65 06/22/20 1140  Resp 21 06/22/20 1140  SpO2 100 % 06/22/20 1140  Vitals shown include unvalidated device data.  Last Pain:  Vitals:   06/22/20 0610  TempSrc:   PainSc: 0-No pain         Complications: No complications documented.

## 2020-06-22 NOTE — Anesthesia Postprocedure Evaluation (Signed)
Anesthesia Post Note  Patient: William Wells  Procedure(s) Performed: XI ROBOTIC ASSISTED THORASCOPY-LEFT UPPER LOBECTOMY (Left Chest) INTERCOSTAL NERVE BLOCK (Left Chest) NODE DISSECTION (Left Chest)     Patient location during evaluation: PACU Anesthesia Type: General Level of consciousness: awake and alert Pain management: pain level controlled Vital Signs Assessment: post-procedure vital signs reviewed and stable Respiratory status: spontaneous breathing, nonlabored ventilation and respiratory function stable Cardiovascular status: blood pressure returned to baseline and stable Postop Assessment: no apparent nausea or vomiting Anesthetic complications: no   No complications documented.  Last Vitals:  Vitals:   06/22/20 1342 06/22/20 1402  BP: 121/67 130/74  Pulse: 77 88  Resp: 13 14  Temp: (!) 36.2 C   SpO2: 98% 97%    Last Pain:  Vitals:   06/22/20 1342  TempSrc:   PainSc: Asleep                 Deante Blough,W. EDMOND

## 2020-06-22 NOTE — Anesthesia Procedure Notes (Addendum)
Procedure Name: Intubation Date/Time: 06/22/2020 7:45 AM Performed by: Janene Harvey, CRNA Pre-anesthesia Checklist: Patient identified, Emergency Drugs available, Suction available and Patient being monitored Patient Re-evaluated:Patient Re-evaluated prior to induction Oxygen Delivery Method: Circle system utilized Preoxygenation: Pre-oxygenation with 100% oxygen Induction Type: IV induction Ventilation: Mask ventilation without difficulty Grade View: Grade II Tube type: Oral Endobronchial tube: Left, Double lumen EBT, EBT position confirmed by auscultation and EBT position confirmed by fiberoptic bronchoscope and 39 Fr Number of attempts: 1 Airway Equipment and Method: Stylet Placement Confirmation: ETT inserted through vocal cords under direct vision,  positive ETCO2 and breath sounds checked- equal and bilateral Secured at: 29 cm Tube secured with: Tape Dental Injury: Teeth and Oropharynx as per pre-operative assessment  Comments: DLT placed by Lajean Manes SRNA

## 2020-06-22 NOTE — Plan of Care (Signed)
  Problem: Education: Goal: Knowledge of General Education information will improve Description: Including pain rating scale, medication(s)/side effects and non-pharmacologic comfort measures Outcome: Progressing   Problem: Clinical Measurements: Goal: Ability to maintain clinical measurements within normal limits will improve Outcome: Progressing   Problem: Clinical Measurements: Goal: Will remain free from infection Outcome: Progressing   Problem: Clinical Measurements: Goal: Diagnostic test results will improve Outcome: Progressing   Problem: Clinical Measurements: Goal: Respiratory complications will improve Outcome: Progressing   Problem: Nutrition: Goal: Adequate nutrition will be maintained Outcome: Progressing   Problem: Pain Managment: Goal: General experience of comfort will improve Outcome: Progressing   Problem: Skin Integrity: Goal: Risk for impaired skin integrity will decrease Outcome: Progressing

## 2020-06-22 NOTE — Op Note (Signed)
NAME: William Wells, William Wells. MEDICAL RECORD [id number] ACCOUNT [id number] DATE OF BIRTH:[birth year] FACILITY: [facility] LOCATION: [facility] PHYSICIAN:Yaiden Yang Chaya Jan, MD  OPERATIVE REPORT  DATE OF PROCEDURE:  06/22/2020  PREOPERATIVE DIAGNOSIS:  Adenocarcinoma, left upper lobe, clinical stage IA (T1, N0).  POSTOPERATIVE DIAGNOSIS:  Adenocarcinoma, left upper lobe, clinical stage IA (T1, N0).  PROCEDURE:   1.  Xi robotic video-assisted left upper lobectomy. 2.  Lymph node dissection.  3.  Intercostal nerve block levels 3 through 10.  SURGEON:  Modesto Charon, MD  ASSISTANT:  Nicholes Rough, PA-C  ANESTHESIA:  General.  FINDINGS:  Bronchial margin negative for tumor on frozen section.  CLINICAL NOTE:  William Wells is an 86 year old gentleman who recently presented with neck pain and headache.  During his workup, he was incidentally found to have a left apical lung nodule.  The nodule was hypermetabolic on PET CT.  There was no evidence  of regional or distant metastases.  Navigational bronchoscopy with biopsy showed adenocarcinoma.  He was offered stereotactic radiation versus surgical resection.  The indications, risks, benefits, and alternatives were discussed in detail with the  patient.  He understood and accepted the risks and agreed to proceed.  OPERATIVE NOTE:  William Wells was brought to the preoperative holding area on 06/22/2020.  Anesthesia placed a central venous catheter and an arterial blood pressure monitoring line.  He was taken to the operating room, anesthetized and intubated with a  double lumen endotracheal tube.  Intravenous antibiotics were administered.  Sequential compression devices were placed on the calves for DVT prophylaxis.  A Foley catheter was placed.  He was placed in a right lateral decubitus position and the left  chest was prepped and draped in the usual sterile fashion.  Single-lung ventilation of the right lung was initiated and was tolerated well throughout  the procedure.  A timeout was performed.  A solution containing 20 mL of liposomal bupivacaine, 30 mL of 0.5% bupivacaine and 50 mL of saline was prepared.  This was used for local at the incision sites as well as for the intercostal nerve blocks.  An incision was made  in the 8th interspace in the mid axillary line.  An 8 mm robotic port was inserted.  The thoracoscope was advanced into the chest.  There was good isolation of the left lung.  There was 1 small adhesion near the apex.  Carbon dioxide was insufflated per  protocol.  A 12 mm port was placed 5 cm anterior in the 8th interspace and a 12 mm assistant port was placed in the 10th interspace centered between the 2 anterior ports.  Intercostal nerve blocks then were performed from the third to the 10th  interspace.  Ten mL of the bupivacaine solution was injected into a subpleural plane at each level from a posterior approach.  A second 12 mm port was placed 5 cm posterior to the camera port in the 8th interspace and finally an 8 mm port was placed for  retraction arm further posteriorly in the 8th interspace.  The robotic instruments were inserted with thoracoscopic visualization.  From the console, the inferior ligament was divided with cautery.  No lymph node was identified.  Lung was retracted anteriorly and the pleural reflection was divided at the hilum posteriorly.  Level 7 nodes were removed.  The pulmonary artery was  identified and dissected out posteriorly.  Dissection then was carried superiorly.  An adhesion at the apex was taken down and the upper lobe was retracted.  The  pleural reflection was divided at the hilum superiorly.  The aortopulmonary window was  opened.  There was minimal nodal tissue visible.  The fat pad was resected and sent for pathology.  More anteriorly, a level 10 node was removed.  The dissection then was carried into the fissure and the pulmonary artery was identified in an area where the  fissure was nearly  complete.  Overlying pleura was dissected off.  The anterior basilar branches and the lingular branches were identified.  The anterior portion of the major fissure then was completed with sequential firings of the robotic stapler  using a blue cartridge.  The posterior portion of the fissure then was completed as well.  The lingular pulmonary arterial branches were identified.  They arose as a common trunk and a posterior branch was identified as well.  These arterial branches  then were divided separately using a vascular cartridge on the robotic stapler.  Dissection then was carried anteriorly.  The superior pulmonary vein was encircled and divided with the vascular stapler using a gray cartridge.  Additional lymph nodes were  removed and the anterior and apical branches were identified.  These were dissected out working from anteriorly as well as from posteriorly through the fissure.  Once that had been completely dissected out, they were divided with the stapler using a  white cartridge.  The retraction arm then was used to apply gentle traction upwards on the left upper lobe near the bronchus and a stapler with a green cartridge was placed across the left upper lobe bronchus near its origin.  A test inflation showed  good aeration of the lower lobe.  The stapler then was fired, transecting the upper lobe bronchus.  The upper lobe then was placed into an endoscopic retrieval bag and brought towards the lower portion of the chest.  The chest was copiously irrigated  with saline.  A test inflation to 30 cm of water pressure revealed no evidence of an air leak.  A final inspection was made for hemostasis.  The robot then was undocked.  The anterior 8th interspace incision was lengthened to approximately 3 cm in length.  The left upper lobe then was removed through this incision in the endoscopic retrieval bag and sent for frozen section of the bronchial margin, which subsequently  returned with no tumor  seen.  All sponges that had been placed during the procedure were removed.  The majority were removed before placing the lower lobe into a bag.  The vessel loop that had been used was removed as well.  A 28-French Blake drain was  placed through the original port incision and secured with a #1 silk suture.  Dual-lung ventilation was resumed.  The remaining incisions were closed in standard fashion.  All sponge, needle and instrument counts were correct at the end of the procedure.   The chest tube was placed to a Pleur-evac on waterseal.  The patient was placed back in a supine position.  He was extubated in the operating room and taken to the [facility] in good condition.  VN/NUANCE  D:06/22/2020 T:06/22/2020 JOB:[id number]

## 2020-06-23 ENCOUNTER — Encounter (HOSPITAL_COMMUNITY): Payer: Self-pay | Admitting: Thoracic Surgery (Cardiothoracic Vascular Surgery)

## 2020-06-23 ENCOUNTER — Inpatient Hospital Stay (HOSPITAL_COMMUNITY): Payer: Medicare HMO

## 2020-06-23 LAB — GLUCOSE, CAPILLARY
Glucose-Capillary: 124 mg/dL — ABNORMAL HIGH (ref 70–99)
Glucose-Capillary: 129 mg/dL — ABNORMAL HIGH (ref 70–99)
Glucose-Capillary: 129 mg/dL — ABNORMAL HIGH (ref 70–99)
Glucose-Capillary: 87 mg/dL (ref 70–99)

## 2020-06-23 LAB — CBC
HCT: 33.1 % — ABNORMAL LOW (ref 39.0–52.0)
Hemoglobin: 11 g/dL — ABNORMAL LOW (ref 13.0–17.0)
MCH: 29.3 pg (ref 26.0–34.0)
MCHC: 33.2 g/dL (ref 30.0–36.0)
MCV: 88.3 fL (ref 80.0–100.0)
Platelets: 157 10*3/uL (ref 150–400)
RBC: 3.75 MIL/uL — ABNORMAL LOW (ref 4.22–5.81)
RDW: 14.2 % (ref 11.5–15.5)
WBC: 9.8 10*3/uL (ref 4.0–10.5)
nRBC: 0 % (ref 0.0–0.2)

## 2020-06-23 LAB — BASIC METABOLIC PANEL
Anion gap: 9 (ref 5–15)
BUN: 22 mg/dL (ref 8–23)
CO2: 22 mmol/L (ref 22–32)
Calcium: 9 mg/dL (ref 8.9–10.3)
Chloride: 106 mmol/L (ref 98–111)
Creatinine, Ser: 1.71 mg/dL — ABNORMAL HIGH (ref 0.61–1.24)
GFR calc Af Amer: 42 mL/min — ABNORMAL LOW (ref >60)
GFR calc non Af Amer: 36 mL/min — ABNORMAL LOW (ref >60)
Glucose, Bld: 128 mg/dL — ABNORMAL HIGH (ref 70–99)
Potassium: 4.2 mmol/L (ref 3.5–5.1)
Sodium: 137 mmol/L (ref 135–145)

## 2020-06-23 NOTE — Progress Notes (Signed)
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    GOAL:   Patient will meet greater than or equal to 90% of their needs    MONITOR:   Labs, Supplement acceptance, PO intake, Weight trends, Skin, I & O's  REASON FOR ASSESSMENT:   Malnutrition Screening Tool    ASSESSMENT:  RD working remotely.  86 year old male with history of monoclonal gammopathy, HTN, HLD, hiatal hernia, GERD, DM2, CKD stage IV, any hypothyroidism presented with severe right sided neck pain and headache and found to have left upper lobe lung nodule on CT s/p navigational bronchoscopy confirming non small cell carcinoma.  Patient is s/p robotic assisted thorascopy and left upper lobectomy on 9/17  Patient reports having pain from chest tube as well as at incision site. No documented meals for review, suspect decreased meal intake given recent procedure. Per chart weights have trended down ~6 lbs (3.3%) over the past 2 months; insignificant, however concerning given advanced age and newly diagnosed lung cancer. He is on HH/CM diet, which restricts protein, recommend Glucerna Shake BID as well as Ensure Max daily to aid with meeting needs.   I/Os: +2147.6 since admit Chest tube 150 ml x 24 hrs UOP: 585 ml x 24 hrs  Medications reviewed and include: Senokot IVF: NaCl @ 10 ml/hr  Labs: CBGs 129,124,150,250,189 x 24 hrs 9/15 A1c 6.8  NUTRITION - FOCUSED PHYSICAL EXAM: Unable to complete at this time, RD working remotely.    Diet Order:   Diet Order            Diet heart healthy/carb modified  Room service appropriate? Yes; Fluid consistency: Thin  Diet effective now                 EDUCATION NEEDS:   No education needs have been identified at this time  Skin:  Skin Assessment: Skin Integrity Issues: Skin Integrity Issues:: Incisions Incisions: closed;L chest  Last BM:  9/16  Height:   Ht Readings from Last 1 Encounters:  06/22/20 5\' 8"  (1.727 m)    Weight:   Wt Readings from Last 1 Encounters:  06/22/20 71.9 kg    BMI:  Body mass index is 24.11 kg/m.  Estimated Nutritional Needs:   Kcal:  1900-2100  Protein:  95-105  Fluid:  >1.7 L/day   Lajuan Lines, RD, LDN Clinical Nutrition After Hours/Weekend Pager # in [place]

## 2020-06-23 NOTE — Progress Notes (Addendum)
[address]       [place] [address]             [phone]       1 Day Post-Op Procedure(s) (LRB): XI ROBOTIC ASSISTED THORASCOPY-LEFT UPPER LOBECTOMY (Left) INTERCOSTAL NERVE BLOCK (Left) NODE DISSECTION (Left)  Subjective: Patient with incisional, chest tube pain this am  Objective: Vital signs in last 24 hours: Temp:  [97 F (36.1 C)-98.8 F (37.1 C)] 98.1 F (36.7 C) (09/18 0745) Pulse Rate:  [70-98] 83 (09/18 0745) Cardiac Rhythm: Normal sinus rhythm (09/18 0745) Resp:  [11-20] 20 (09/18 0745) BP: (96-130)/(59-74) 124/69 (09/18 0745) SpO2:  [93 %-100 %] 96 % (09/18 0745) Arterial Line BP: (99-143)/(31-57) 143/57 (09/17 1315)     Intake/Output from previous day: 09/17 0701 - 09/18 0700 In: 3082.6 [P.O.:680; I.V.:2002.6; IV Piggyback:400] Out: 935 [Urine:585; Blood:200; Chest Tube:150]   Physical Exam:  Cardiovascular: RRR Pulmonary: Rub with chest tube in place on the left Abdomen: Soft, non tender, bowel sounds present. Extremities: SCDs in place Wounds: Clean and dry.  No erythema or signs of infection. Chest Tube: to water seal, ++ air leak with cough  Lab Results: OZH:YQMVHQ Labs    06/20/20 1127 06/22/20 0820 06/22/20 0954 06/23/20 0111  WBC 5.7  --   --  9.8  HGB 13.1   < > 11.2* 11.0*  HCT 39.5   < > 33.0* 33.1*  PLT 178  --   --  157   < > = values in this interval not displayed.   BMET:  Recent Labs    06/20/20 1127 06/22/20 0820 06/22/20 0954 06/23/20 0111  NA 137   < > 138 137  K 4.3   < > 4.2 4.2  CL 105  --  104 106  CO2 21*  --   --  22  GLUCOSE 130*  --  186* 128*  BUN 30*  --  25* 22  CREATININE 1.96*  --  1.80* 1.71*  CALCIUM 9.6  --   --  9.0   < > = values in this interval not displayed.    PT/INR:  Recent Labs    06/20/20 1127  LABPROT 12.1  INR 0.9   ABG:  INR: Will add last result for INR, ABG once components are confirmed Will add last 4 CBG results once components are  confirmed  Assessment/Plan:  1. CV - SR. On Amlodipine 10 mg daily 2.  Pulmonary - On 2 liters of oxygen via [place]. Wean as able. Chest tube with 150 cc since surgery. Chest tube is to water seal and there is an air leak with cough. CXR this am shows no pneumothorax. Chest tube to remain for now. Check CXR in am. Encourage incentive spirometer 3. Expected ABL anemia-H and H 11 and 33.1. 4. Hypothyroidism-continue Levothyroxine 50 mcg daily 5. DM-CBGs 150/124/129. On Glipizide prior to admission. Will restart at discharge once creatinine decreased;continue Insulin PRN for now. Pre op HGA1C 6.8 6. Patient with renal insufficiency-Creatinine elevated at 1.71, which is decreased from admission 1.93.  Chronic Kidney Disease   Stage I     GFR >90  Stage II    GFR 60-89  Stage IIIA GFR 45-59  Stage IIIB GFR 30-44  Stage IV   GFR 15-29  Stage V    GFR  <15  Lab Results  Component Value Date   CREATININE 1.71 (H) 06/23/2020   Estimated Creatinine Clearance: 31.1 mL/min (A) (by C-G  formula based on SCr of 1.71 mg/dL (H)). 7. Decrease IVF and remove foley  Donielle M ZimmermanPA-C 06/23/2020,9:55 AM    I have seen and examined the patient and agree with the assessment and plan as outlined.  Rexene Alberts, MD 06/23/2020 12:00 PM    [phone]

## 2020-06-24 ENCOUNTER — Inpatient Hospital Stay (HOSPITAL_COMMUNITY): Payer: Medicare HMO

## 2020-06-24 LAB — CBC
HCT: 35.9 % — ABNORMAL LOW (ref 39.0–52.0)
Hemoglobin: 11.8 g/dL — ABNORMAL LOW (ref 13.0–17.0)
MCH: 29.3 pg (ref 26.0–34.0)
MCHC: 32.9 g/dL (ref 30.0–36.0)
MCV: 89.1 fL (ref 80.0–100.0)
Platelets: 148 10*3/uL — ABNORMAL LOW (ref 150–400)
RBC: 4.03 MIL/uL — ABNORMAL LOW (ref 4.22–5.81)
RDW: 14.3 % (ref 11.5–15.5)
WBC: 10.5 10*3/uL (ref 4.0–10.5)
nRBC: 0 % (ref 0.0–0.2)

## 2020-06-24 LAB — COMPREHENSIVE METABOLIC PANEL
ALT: 19 U/L (ref 0–44)
AST: 25 U/L (ref 15–41)
Albumin: 3.2 g/dL — ABNORMAL LOW (ref 3.5–5.0)
Alkaline Phosphatase: 41 U/L (ref 38–126)
Anion gap: 8 (ref 5–15)
BUN: 22 mg/dL (ref 8–23)
CO2: 24 mmol/L (ref 22–32)
Calcium: 9.2 mg/dL (ref 8.9–10.3)
Chloride: 102 mmol/L (ref 98–111)
Creatinine, Ser: 1.72 mg/dL — ABNORMAL HIGH (ref 0.61–1.24)
GFR calc Af Amer: 41 mL/min — ABNORMAL LOW (ref >60)
GFR calc non Af Amer: 36 mL/min — ABNORMAL LOW (ref >60)
Glucose, Bld: 138 mg/dL — ABNORMAL HIGH (ref 70–99)
Potassium: 4.1 mmol/L (ref 3.5–5.1)
Sodium: 134 mmol/L — ABNORMAL LOW (ref 135–145)
Total Bilirubin: 0.6 mg/dL (ref 0.3–1.2)
Total Protein: 6.6 g/dL (ref 6.5–8.1)

## 2020-06-24 LAB — GLUCOSE, CAPILLARY
Glucose-Capillary: 120 mg/dL — ABNORMAL HIGH (ref 70–99)
Glucose-Capillary: 144 mg/dL — ABNORMAL HIGH (ref 70–99)
Glucose-Capillary: 194 mg/dL — ABNORMAL HIGH (ref 70–99)
Glucose-Capillary: 114 mg/dL — ABNORMAL HIGH (ref 70–99)

## 2020-06-24 MED ORDER — TAMSULOSIN HCL 0.4 MG PO CAPS
0.4000 mg | ORAL_CAPSULE | Freq: Every day | ORAL | Status: DC
  Administered 2020-06-24 – 2020-06-26 (×3): 0.4 mg via ORAL
  Filled 2020-06-24 (×3): qty 1

## 2020-06-24 MED ORDER — BOOST / RESOURCE BREEZE PO LIQD CUSTOM
237.0000 mL | Freq: Two times a day (BID) | ORAL | Status: DC
  Administered 2020-06-24 – 2020-06-26 (×4): 1 via ORAL
  Filled 2020-06-24 (×6): qty 1

## 2020-06-24 MED ORDER — LACTULOSE 10 GM/15ML PO SOLN
20.0000 g | Freq: Once | ORAL | Status: AC
  Administered 2020-06-24: 20 g via ORAL
  Filled 2020-06-24: qty 30

## 2020-06-24 NOTE — Progress Notes (Addendum)
      [address]       [place] [address]             [phone]       2 Days Post-Op Procedure(s) (LRB): XI ROBOTIC ASSISTED THORASCOPY-LEFT UPPER LOBECTOMY (Left) INTERCOSTAL NERVE BLOCK (Left) NODE DISSECTION (Left)  Subjective: Patient sitiing in chair. He is requesting a laxative for constipation.  Objective: Vital signs in last 24 hours: Temp:  [97.6 F (36.4 C)-98.6 F (37 C)] 98.6 F (37 C) (09/19 0326) Pulse Rate:  [76-107] 81 (09/19 0636) Cardiac Rhythm: Sinus tachycardia (09/19 0714) Resp:  [15-18] 16 (09/19 0636) BP: (123-175)/(64-86) 157/73 (09/19 0636) SpO2:  [93 %-96 %] 95 % (09/19 0636)     Intake/Output from previous day: 09/18 0701 - 09/19 0700 In: -  Out: 1050 [Urine:775; Chest Tube:275]   Physical Exam:  Cardiovascular: RRR Pulmonary: Rub with chest tube in place on the left Abdomen: Soft, non tender, bowel sounds present. Extremities: SCDs in place Wounds: Clean and dry.  No erythema or signs of infection. Chest Tube: to water seal, ++ air leak with cough  Lab Results: CBC: Recent Labs    06/23/20 0111 06/24/20 0340  WBC 9.8 10.5  HGB 11.0* 11.8*  HCT 33.1* 35.9*  PLT 157 148*   BMET:  Recent Labs    06/23/20 0111 06/24/20 0340  NA 137 134*  K 4.2 4.1  CL 106 102  CO2 22 24  GLUCOSE 128* 138*  BUN 22 22  CREATININE 1.71* 1.72*  CALCIUM 9.0 9.2    PT/INR:  No results for input(s): LABPROT, INR in the last 72 hours. ABG:  INR: Will add last result for INR, ABG once components are confirmed Will add last 4 CBG results once components are confirmed  Assessment/Plan:  1. CV - SR. On Amlodipine 10 mg daily 2.  Pulmonary - On 2 liters of oxygen via [place]. Wean as able. Chest tube with 275 cc since surgery. Chest tube is to water seal and there is a ++ air leak with cough. CXR this am shows no pneumothorax and stable left lateral chest wall subcutaneous emphysema. Chest tube to remain for now. Check CXR in  am. Encourage incentive spirometer 3. Expected ABL anemia-H and H this am stable at 11.8 and 35.9 4. Hypothyroidism-continue Levothyroxine 50 mcg daily 5. DM-CBGs 129/87/120. On Glipizide prior to admission. Will restart at discharge once creatinine decreased;continue Insulin PRN for now. Pre op HGA1C 6.8 6. Patient with renal insufficiency-Creatinine elevated at 1.72, which is decreased from admission 1.93.  Chronic Kidney Disease   Stage I     GFR >90  Stage II    GFR 60-89  Stage IIIA GFR 45-59  Stage IIIB GFR 30-44  Stage IV   GFR 15-29  Stage V    GFR  <15  Lab Results  Component Value Date   CREATININE 1.72 (H) 06/24/2020   Estimated Creatinine Clearance: 30.9 mL/min (A) (by C-G formula based on SCr of 1.72 mg/dL (H)). 7. LOC constipation 8. Remove central line in am  Donielle M ZimmermanPA-C 06/24/2020,10:09 AM    I have seen and examined the patient and agree with the assessment and plan as outlined.  Leave tube in for now.  Rexene Alberts, MD 06/24/2020 10:44 AM

## 2020-06-25 ENCOUNTER — Inpatient Hospital Stay (HOSPITAL_COMMUNITY): Payer: Medicare HMO

## 2020-06-25 ENCOUNTER — Ambulatory Visit (HOSPITAL_COMMUNITY): Payer: Medicare HMO | Admitting: Hematology

## 2020-06-25 LAB — SURGICAL PATHOLOGY

## 2020-06-25 LAB — BASIC METABOLIC PANEL
Anion gap: 7 (ref 5–15)
BUN: 20 mg/dL (ref 8–23)
CO2: 25 mmol/L (ref 22–32)
Calcium: 9.4 mg/dL (ref 8.9–10.3)
Chloride: 104 mmol/L (ref 98–111)
Creatinine, Ser: 1.68 mg/dL — ABNORMAL HIGH (ref 0.61–1.24)
GFR calc Af Amer: 43 mL/min — ABNORMAL LOW (ref >60)
GFR calc non Af Amer: 37 mL/min — ABNORMAL LOW (ref >60)
Glucose, Bld: 153 mg/dL — ABNORMAL HIGH (ref 70–99)
Potassium: 4.1 mmol/L (ref 3.5–5.1)
Sodium: 136 mmol/L (ref 135–145)

## 2020-06-25 LAB — GLUCOSE, CAPILLARY
Glucose-Capillary: 143 mg/dL — ABNORMAL HIGH (ref 70–99)
Glucose-Capillary: 190 mg/dL — ABNORMAL HIGH (ref 70–99)
Glucose-Capillary: 122 mg/dL — ABNORMAL HIGH (ref 70–99)
Glucose-Capillary: 173 mg/dL — ABNORMAL HIGH (ref 70–99)

## 2020-06-25 MED ORDER — GLIPIZIDE ER 2.5 MG PO TB24
2.5000 mg | ORAL_TABLET | Freq: Every day | ORAL | Status: DC | PRN
  Filled 2020-06-25: qty 1

## 2020-06-25 MED ORDER — VITAMIN D 25 MCG (1000 UNIT) PO TABS
2000.0000 [IU] | ORAL_TABLET | Freq: Every day | ORAL | Status: DC
  Administered 2020-06-25 – 2020-06-26 (×2): 2000 [IU] via ORAL
  Filled 2020-06-25 (×2): qty 2

## 2020-06-25 MED ORDER — LACTULOSE 10 GM/15ML PO SOLN
20.0000 g | Freq: Once | ORAL | Status: AC
  Administered 2020-06-25: 20 g via ORAL
  Filled 2020-06-25: qty 30

## 2020-06-25 NOTE — Progress Notes (Addendum)
      [address]       [place] [address]             [phone]      3 Days Post-Op Procedure(s) (LRB): XI ROBOTIC ASSISTED THORASCOPY-LEFT UPPER LOBECTOMY (Left) INTERCOSTAL NERVE BLOCK (Left) NODE DISSECTION (Left) Subjective: Feels okay this morning. Planning to use his incentive spirometer later this morning.   Objective: Vital signs in last 24 hours: Temp:  [97.7 F (36.5 C)-98.4 F (36.9 C)] 97.7 F (36.5 C) (09/20 0344) Pulse Rate:  [79-95] 95 (09/20 0344) Cardiac Rhythm: Normal sinus rhythm (09/20 0400) Resp:  [16-19] 18 (09/20 0344) BP: (122-142)/(73-78) 125/74 (09/20 0344) SpO2:  [95 %-99 %] 96 % (09/20 0344)     Intake/Output from previous day: 09/19 0701 - 09/20 0700 In: -  Out: 3820 [Urine:3650; Chest Tube:170] Intake/Output this shift: No intake/output data recorded.  General appearance: alert, cooperative and no distress Heart: regular rate and rhythm, S1, S2 normal, no murmur, click, rub or gallop Lungs: clear to auscultation bilaterally Abdomen: soft, non-tender; bowel sounds normal; no masses,  no organomegaly Extremities: extremities normal, atraumatic, no cyanosis or edema Wound: clean and dry  Lab Results: Recent Labs    06/23/20 0111 06/24/20 0340  WBC 9.8 10.5  HGB 11.0* 11.8*  HCT 33.1* 35.9*  PLT 157 148*   BMET:  Recent Labs    06/24/20 0340 06/25/20 0535  NA 134* 136  K 4.1 4.1  CL 102 104  CO2 24 25  GLUCOSE 138* 153*  BUN 22 20  CREATININE 1.72* 1.68*  CALCIUM 9.2 9.4    PT/INR: No results for input(s): LABPROT, INR in the last 72 hours. ABG    Component Value Date/Time   PHART 7.360 06/22/2020 0820   HCO3 28.6 (H) 06/22/2020 0820   TCO2 24 06/22/2020 0954   ACIDBASEDEF 0.2 06/20/2020 1137   O2SAT 100.0 06/22/2020 0820   CBG (last 3)  Recent Labs    06/24/20 1547 06/24/20 2129 06/25/20 0544  GLUCAP 194* 114* 143*    Assessment/Plan: S/P Procedure(s) (LRB): XI ROBOTIC ASSISTED  THORASCOPY-LEFT UPPER LOBECTOMY (Left) INTERCOSTAL NERVE BLOCK (Left) NODE DISSECTION (Left)  1. CV-NSR in the 80s, BP well controlled on Norvasc. 2. Pulm- CXR this morning shows: Left chest tube in place, no pneumothorax. Tolerating room air this morning  3. Creatinine 1.68 which is decreased from admission of 1.93 4. H and H stable 5. Blood glucose mostly controlled. Holding glipizide due to creatinine 6. Continue lovenox for DVT prophylaxis  Plan: Feels okay this morning. No BM so will reorder lactulose. Keep urinary cath one more day, on flomax. Clamp chest tube and repeat CXR at noon.     LOS: 3 days    Elgie Collard 06/25/2020 Patient seen and examined, agree with above Questionable whether or not there was an air leak this AM- tube clamped for 4 hours. CXR unchanged and when unclamped no leak apparent. Will dc chest tube and central line Foley replaced for urinary retention- will do another voiding trial tomorrow  Remo Lipps C. Roxan Hockey, MD [facility] [phone]

## 2020-06-25 NOTE — Care Management Important Message (Signed)
-  Important Message  Patient Details  Name: William Wells MRN: [id number] Date of Birth: 1934/09/13   Medicare Important Message Given:  Yes     Fallon Howerter 06/25/2020, 1:51 PM

## 2020-06-25 NOTE — Progress Notes (Signed)
Chest tube was removed per order, patient tolerated removal well. X-ray was called for post removal image. R. IJ removed without complication, patient tolerated well. Currently on best rest for 1 hour.

## 2020-06-25 NOTE — Progress Notes (Signed)
Bladder scan done 6 hrs after initial with a result (>900). Pt still unable to void. CN informed. Foley inserted as ordered. Currently got 1600 ml from foley.

## 2020-06-26 ENCOUNTER — Inpatient Hospital Stay (HOSPITAL_COMMUNITY): Payer: Medicare HMO

## 2020-06-26 DIAGNOSIS — C3492 Malignant neoplasm of unspecified part of left bronchus or lung: Secondary | ICD-10-CM

## 2020-06-26 LAB — GLUCOSE, CAPILLARY
Glucose-Capillary: 142 mg/dL — ABNORMAL HIGH (ref 70–99)
Glucose-Capillary: 194 mg/dL — ABNORMAL HIGH (ref 70–99)

## 2020-06-26 MED ORDER — DOCUSATE SODIUM 100 MG PO CAPS: 100.0000 mg | ORAL_CAPSULE | Freq: Two times a day (BID) | ORAL | Status: DC | PRN

## 2020-06-26 MED ORDER — DOCUSATE SODIUM 100 MG PO CAPS: 100.0000 mg | ORAL_CAPSULE | Freq: Two times a day (BID) | ORAL | 0 refills | Status: AC | PRN

## 2020-06-26 MED ORDER — TAMSULOSIN HCL 0.4 MG PO CAPS: 0.4000 mg | ORAL_CAPSULE | Freq: Every day | ORAL | 0 refills | Status: DC

## 2020-06-26 MED ORDER — TRAMADOL HCL 50 MG PO TABS
50.0000 mg | ORAL_TABLET | Freq: Four times a day (QID) | ORAL | 0 refills | Status: DC | PRN
Start: 2020-06-26 — End: 2021-09-08

## 2020-06-26 MED ORDER — ACETAMINOPHEN 500 MG PO TABS: 1000.0000 mg | ORAL_TABLET | Freq: Four times a day (QID) | ORAL | 0 refills | Status: DC

## 2020-06-26 NOTE — Progress Notes (Addendum)
      [address]       [place], [address]             [phone]      4 Days Post-Op Procedure(s) (LRB): XI ROBOTIC ASSISTED THORASCOPY-LEFT UPPER LOBECTOMY (Left) INTERCOSTAL NERVE BLOCK (Left) NODE DISSECTION (Left) Subjective: Feels okay this morning. Pain is well controlled  Objective: Vital signs in last 24 hours: Temp:  [98 F (36.7 C)-98.3 F (36.8 C)] 98.3 F (36.8 C) (09/21 0346) Pulse Rate:  [86-109] 86 (09/21 0346) Cardiac Rhythm: Sinus tachycardia;Heart block (09/21 0705) Resp:  [15-24] 17 (09/21 0346) BP: (109-122)/(71-73) 122/71 (09/21 0346) SpO2:  [94 %-96 %] 94 % (09/21 0346)     Intake/Output from previous day: 09/20 0701 - 09/21 0700 In: 480 [P.O.:480] Out: 2800 [Urine:2800] Intake/Output this shift: No intake/output data recorded.  General appearance: alert, cooperative and no distress Heart: sinus tachycardia Lungs: clear to auscultation bilaterally Abdomen: soft, non-tender; bowel sounds normal; no masses,  no organomegaly Extremities: extremities normal, atraumatic, no cyanosis or edema Wound: clean and dry  Lab Results: Recent Labs    06/24/20 0340  WBC 10.5  HGB 11.8*  HCT 35.9*  PLT 148*   BMET:  Recent Labs    06/24/20 0340 06/25/20 0535  NA 134* 136  K 4.1 4.1  CL 102 104  CO2 24 25  GLUCOSE 138* 153*  BUN 22 20  CREATININE 1.72* 1.68*  CALCIUM 9.2 9.4    PT/INR: No results for input(s): LABPROT, INR in the last 72 hours. ABG    Component Value Date/Time   PHART 7.360 06/22/2020 0820   HCO3 28.6 (H) 06/22/2020 0820   TCO2 24 06/22/2020 0954   ACIDBASEDEF 0.2 06/20/2020 1137   O2SAT 100.0 06/22/2020 0820   CBG (last 3)  Recent Labs    06/25/20 1625 06/25/20 2130 06/26/20 0626  GLUCAP 122* 173* 142*    Assessment/Plan: S/P Procedure(s) (LRB): XI ROBOTIC ASSISTED THORASCOPY-LEFT UPPER LOBECTOMY (Left) INTERCOSTAL NERVE BLOCK (Left) NODE DISSECTION (Left)  1. CV-NSR in the 80s, BP  well controlled on Norvasc. 2. Pulm- CXR stable s/p CT removal 3. Creatinine 1.68 which is decreased from admission of 1.93 4. H and H stable 5. Blood glucose mostly controlled. Holding glipizide due to creatinine 6. Continue lovenox for DVT prophylaxis 7. Urinary retention-voiding trial this morning  8. Tolerating diet and + BM  Plan: Has not ambulated in the halls. Plans to do this today. Voiding trial today. Maybe home later today.    LOS: 4 days    Elgie Collard 06/26/2020 Patient seen and examined, agree with above Path- T1cN0- stage IA adenocarcinoma  Remo Lipps C. Roxan Hockey, MD [facility] [phone]

## 2020-06-26 NOTE — Progress Notes (Signed)
      [address]       [place], [address]             [phone]        Failed second voiding trial on flomax for 2 days. He is retaining 524ml of urine per the bladder scan. He had voided around 53ml but nothing significant. Urology has been paged and probably will place foley catheter back in and follow-up outpatient.   Plan for discharge once urology has seen and follow-up has been made.    Nicholes Rough, PA-C

## 2020-06-26 NOTE — Discharge Summary (Addendum)
[address]       [place] [address]             [phone]      Physician Discharge Summary  Patient ID: William BUCHANON MRN: [id number] DOB/AGE: [birth year] 86 y.o.  Admit date: 06/22/2020 Discharge date: 06/26/2020  Admission Diagnoses:  Patient Active Problem List   Diagnosis Date Noted  . Adenocarcinoma of left lung, stage 1 ([place]) 06/26/2020  . Status post robot-assisted surgical procedure 06/22/2020  . S/P robot-assisted surgical procedure 06/22/2020  . Mass of upper lobe of left lung 04/11/2020  . Monoclonal gammopathy of unknown significance ([facility]) 03/28/2020  . Normocytic anemia 03/28/2020  . Sciatica, left side 02/17/2018    Discharge Diagnoses:  Active Problems:   Status post robot-assisted surgical procedure   S/P robot-assisted surgical procedure   Adenocarcinoma of left lung, stage 1 ([facility])   Discharged Condition: good  HPI:   William Wells is an 87 year old man with a past medical history significant for monoclonal gammopathy, hypertension, hyperlipidemia, hiatal hernia, acid reflux, type 2 diabetes mellitus, stage IV chronic kidney disease, and hypothyroidism.  He recently presented to the emergency room with severe right-sided neck pain and a headache.  He became dizzy and briefly passed out.  He was taken to the ED.  He had a CT of the head and neck.  No source was found for the pain.  However, there was an incidental left apical lung nodule measuring approximately 2.4 cm.  A PET/CT on 04/09/2020 showed the nodule was hypermetabolic with an SUV of 2.2.  There was no evidence of regional or distant metastasis.  He was referred to Dr. Lamonte Wells.  Navigational bronchoscopy was performed on 05/29/2020.  Biopsy showed non-small cell carcinoma, probable adenocarcinoma.  William Wells recently retired after working [employer].  He has had significant weight loss over the past year after changing his diet due to his kidney disease.  He has lost 18 pounds over  the past 6 months and 6 pounds over the past 3 months.  Overall he has lost about 70 pounds over the past year.  Occasional reflux.  Controlled with antireflux medications.  No chest pain, pressure, tightness, or shortness of breath with exertion.  He will get short of breath if he walks a very long distance at a rapid pace but can walk over a mile.  He can walk up a flight of stairs without stopping.  He does have occasional cough but no wheezing.  He bruises easily.  Hospital Course:   William Wells is an 85 year old gentleman who underwent a robotic assisted left upper lobectomy with Dr. Roxan Hockey on 06/22/2020.  He tolerated the procedure well and was transferred to the cardiac telemetry unit for continued care.  He was extubated timely manner.  Postop day 1 he remained in normal sinus rhythm.  He was started back on his home dose of Norvasc for hypertension.  He remained on 2 L nasal cannula which we were weaning.  We were able to place his chest tube to waterseal.  There was a small air leak with cough.  His creatinine was elevated on admission to 1.93 and it was trending down and was 1.71.  We removed his Foley catheter and decreased his IV fluids.  Postop day 2 he continued to progress.  He still had an air leak with cough but his chest x-ray showed no pneumothorax.  His hemoglobin and hematocrit remained stable.  We kept his chest  tube in due to an air leak.  He had some urinary retention therefore his Foley catheter had to be reinserted.  Postop day 3 we able to clamp his chest tube and since there was no air leak we discontinued his chest tube in his central line.  Postop day 4 we tried a voiding trial again and discontinued the Foley catheter.  He did have some retention and on bladder scan showed 500 mL of urine.  Urology was consulted and a Foley catheter was put back in.  He will follow-up with urology outpatient early next week for catheter removal.  The patient had been on Flomax for 2 days at  this point and will continue on discharge.   Consults: urology  Significant Diagnostic Studies:   CLINICAL DATA:  Status post lobectomy.  EXAM: CHEST - 2 VIEW  COMPARISON:  06/25/2020.  FINDINGS: Mediastinum and hilar structures stable. Heart size stable. Postsurgical changes left lung. Density is noted in the left mid lung. On lateral view there is a small air-fluid level over this density. This could represent a fluid-filled pneumatocele or small loculated anterior hydropneumothorax. Previously identified tiny left apical pneumothorax not identified on today's exam. Tiny left pleural effusion cannot be excluded. Stable left chest wall subcutaneous emphysema. Diffuse osteopenia. Degenerative change thoracic spine.  IMPRESSION: 1. Postsurgical changes left lung. Density noted over the left mid chest. On lateral view there is a small air-fluid level over this density. This could represent a fluid-filled pneumatocele or small loculated anterior hydropneumothorax.  2. Previously identified tiny left apical pneumothorax not identified on today's exam. Tiny left pleural effusion cannot be excluded. Left chest wall subcutaneous emphysema again noted.   Electronically Signed   By: Marcello Moores  Register   On: 06/26/2020 06:15  Treatments:  NAME: William Wells. MEDICAL RECORD [id number] ACCOUNT [id number] DATE OF BIRTH:07/11/1934 FACILITY: [facility] LOCATION: [facility] PHYSICIAN:Jarion Hawthorne C. Lacee Grey, MD  OPERATIVE REPORT  DATE OF PROCEDURE:  06/22/2020  PREOPERATIVE DIAGNOSIS:  Adenocarcinoma, left upper lobe, clinical stage IA (T1, N0.  POSTOPERATIVE DIAGNOSIS:  Adenocarcinoma, left upper lobe, clinical stage IA (T1, N0.  PROCEDURE:   1.  Xi robotic video-assisted left upper lobectomy. 2.  Lymph node dissection.  3.   Intercostal nerve block levels 3 through 10.  SURGEON:  Modesto Charon, MD  ASSISTANT:  Nicholes Rough, PA-C  ANESTHESIA:   General.  FINDINGS:  Bronchial margin negative for tumor on frozen section.  Discharge Exam: Blood pressure 117/66, pulse 95, temperature 97.7 F (36.5 C), temperature source Oral, resp. rate 17, height 5\' 8"  (1.727 m), weight 71.9 kg, SpO2 98 %.  General appearance: alert, cooperative and no distress Heart: sinus tachycardia Lungs: clear to auscultation bilaterally Abdomen: soft, non-tender; bowel sounds normal; no masses,  no organomegaly Extremities: extremities normal, atraumatic, no cyanosis or edema Wound: clean and dry    Disposition: Discharge disposition: 01-Home or Self Care        Allergies as of 06/26/2020   No Known Allergies     Medication List    STOP taking these medications   HYDROcodone-acetaminophen 5-325 MG tablet Commonly known as: NORCO/VICODIN     TAKE these medications   acetaminophen 500 MG tablet Commonly known as: TYLENOL Take 2 tablets (1,000 mg total) by mouth every 6 (six) hours.   amLODipine 10 MG tablet Commonly known as: NORVASC Take 10 mg by mouth daily.   Cholecalciferol 50 MCG (2000 UT) Tabs Take 2,000 Units by mouth daily.   docusate sodium 100 MG capsule  Commonly known as: COLACE Take 1 capsule (100 mg total) by mouth 2 (two) times daily as needed for mild constipation.   glipiZIDE 2.5 MG 24 hr tablet Commonly known as: GLUCOTROL XL Take 2.5 mg by mouth daily as needed (blood sugar above 160).   levothyroxine 50 MCG tablet Commonly known as: SYNTHROID Take 50 mcg by mouth daily before breakfast.   omeprazole 20 MG capsule Commonly known as: PRILOSEC Take 20 mg by mouth daily.   OneTouch Delica Plus [id number] Misc 2 (two) times daily.   OneTouch Ultra test strip Generic drug: glucose blood 2 (two) times daily.   rosuvastatin 40 MG tablet Commonly known as: CRESTOR Take 40 mg by mouth daily.   tamsulosin 0.4 MG Caps capsule Commonly known as: FLOMAX Take 1 capsule (0.4 mg total) by mouth daily. Start  taking on: June 27, 2020   traMADol 50 MG tablet Commonly known as: ULTRAM Take 1 tablet (50 mg total) by mouth every 6 (six) hours as needed (mild pain).       Follow-up Information    Celene Squibb, MD. Call in 1 day(s).   Specialty: Internal Medicine Contact information: [facility] [address] [phone]        Ardis Hughs, MD. Call in 1 day(s).   Specialty: Urology Why: Follow-up next week for urinary catheter removal.  Contact information: [place]  [address] [phone]        Melrose Nakayama, MD Follow up.   Specialty: Cardiothoracic Surgery Why: Your follow-up is on 10/5 at 12:30pm. Please arrive at 12:00pm for a CXR at [facility] which is on the first floor of our building Contact information: [address] [place] [address] [phone]               Signed:  Elgie Collard 06/26/2020, 2:15 PM

## 2020-06-26 NOTE — Discharge Instructions (Signed)
Robot-Assisted Thoracic Surgery    Robot-assisted thoracic surgery is a procedure in which robotic arms and surgical instruments are used to perform complex procedures through small incisions. During surgery, the surgeon sits at a console in the operating room and uses controls at the console to move the robotic arms. Surgical instruments are attached to the ends of the robotic arms. Instruments include a tool with a light and camera on the end of it (thoracoscope). The camera sends images to a video monitor that your surgeon will use to view the inside of your thoracic area. The thoracic area is between the neck and abdomen. Robot-assisted surgery may be done to:  Remove a tissue sample to be tested in a lab (biopsy).  Remove a part of a lung (lobectomy) or the whole lung (pneumonectomy).  Remove tumors.  Treat other conditions that affect: ? Your esophagus. This is the part of your body that carries food and liquid from your mouth to your stomach. ? Your diaphragm. This is a muscle wall between your lungs and stomach area. It helps with breathing.  Remove a portion of the nerves (sympathectomy) that cause excessive sweating. Tell a health care provider about:  Any allergies you have. This is especially important if you have allergies to medicines or sedatives.  All medicines you are taking, including vitamins, herbs, eye drops, creams, and over-the-counter medicines.  Any problems you or family members have had with anesthetic medicines.  Any blood disorders you have.  Any surgeries you have had.  Any medical conditions you have.  Whether you are pregnant or may be pregnant. What are the risks? Generally, this is a safe procedure. However, problems may occur, including:  Infection, such as pneumonia.  Severe bleeding (hemorrhage).  Allergic reactions to medicines.  Damage to organs or structures such as nerves or blood vessels. What happens before the  procedure? Medicines  Ask your health care provider about: ? Changing or stopping your regular medicines. This is especially important if you are taking diabetes medicines or blood thinners. ? Taking medicines such as aspirin and ibuprofen. These medicines can thin your blood. Do not take these medicines unless your health care provider tells you to take them. ? Taking over-the-counter medicines, vitamins, herbs, and supplements.  Before you go into the operating room, you may be given antibiotic medicine to help prevent infection.  Talk with your health care provider about safe and effective ways to manage pain before and after your procedure. Pain management should fit your specific health needs. Staying hydrated Follow instructions from your health care provider about hydration, which may include:  Up to 2 hours before the procedure - you may continue to drink clear liquids, such as water, clear fruit juice, black coffee, and plain tea. Eating and drinking restrictions Follow instructions from your health care provider about eating and drinking, which may include:  8 hours before the procedure - stop eating heavy meals or foods such as meat, fried foods, or fatty foods.  6 hours before the procedure - stop eating light meals or foods, such as toast or cereal.  6 hours before the procedure - stop drinking milk or drinks that contain milk.  2 hours before the procedure - stop drinking clear liquids. General instructions  Ask your health care provider how your surgical site will be marked or identified.  You may have tests, such as: ? CT scan. ? Ultrasound. ? Chest X-ray. ? Electrocardiogram (ECG).  You may have a blood or urine   sample taken.  Do not use any products that contain nicotine or tobacco, such as cigarettes and e-cigarettes. If you need help quitting, ask your health care provider.  You may be asked to shower with a germ-killing soap.  Plan to have someone take  you home from the hospital or clinic.  Plan to have a responsible adult care for you for at least 24 hours after you leave the hospital or clinic. This is important. What happens during the procedure?  To lower your risk of infection: ? Your health care team will wash or sanitize their hands. ? Hair may be removed from the surgical area. ? Your skin will be washed with soap.  An IV will be inserted into one of your veins.  You will be given one or more of the following: ? A medicine to help you relax (sedative). ? A medicine to make you fall asleep (general anesthetic). ? A medicine that is injected into an area of your body to numb everything below the injection site (regional anesthetic).  One to four small incisions will be made. The number of incisions and the area where incisions are made will depend on the purpose of your procedure.  Necessary procedures will be performed using the robotic system.  A drainage tube may be placed to drain excess fluid from the surgical area.  Your incisions will be closed with stitches (sutures) or staples and covered with a bandage (dressing). The procedure may vary among health care providers and hospitals. What happens after the procedure?  Your blood pressure, heart rate, breathing rate, and blood oxygen level will be monitored until the medicines you were given have worn off.  You may have a drainage tube in place. It may stay in place for a few days after the procedure to monitor for signs of air or fluid buildup in your lungs.  You may have to wear compression stockings. These stockings help to prevent blood clots and reduce swelling in your legs. Summary  Robot-assisted thoracic surgery is a procedure in which robotic arms and surgical instruments are used to help perform complex procedures through small incisions. During surgery, the surgeon sits at a console in the operating room and uses controls at the console to move the robotic  arms.  Compared to surgery between the neck and abdomen that is done by hand (traditional thoracic surgery), robot-assisted surgery allows the surgeon to perform complex procedures more easily.  A drainage tube may be placed to drain excess fluid from the surgical area. The tube will be closely monitored for fluid or air buildup in your lungs. This information is not intended to replace advice given to you by your health care provider. Make sure you discuss any questions you have with your health care provider. Document Revised: 07/22/2019 Document Reviewed: 01/26/2017 Elsevier Patient Education  2020 [employer]  

## 2020-06-26 NOTE — Progress Notes (Signed)
Patient ambulated 313ft on unit, tolerated well.

## 2020-06-26 NOTE — Progress Notes (Signed)
Discharge instructions given to patient with daughter at bedside, patient understands all post op appointments including calling for urology appointment for next week to have urinary catheter removed. IV removed before discharge without complication, site clean dry and intact.

## 2020-06-27 ENCOUNTER — Ambulatory Visit: Payer: Medicare HMO | Admitting: Emergency Medicine

## 2020-06-27 LAB — TYPE AND SCREEN
ABO/RH(D): A POS
Antibody Screen: NEGATIVE
Unit division: 0
Unit division: 0
Unit division: 0
Unit division: 0

## 2020-06-27 LAB — BPAM RBC
Blood Product Expiration Date: 202110032359
Blood Product Expiration Date: 202110082359
Blood Product Expiration Date: 202110082359
Blood Product Expiration Date: 202110132359
ISSUE DATE / TIME: 202109150823
ISSUE DATE / TIME: 202109170730
ISSUE DATE / TIME: 202109170730
Unit Type and Rh: 6200
Unit Type and Rh: 6200
Unit Type and Rh: 6200
Unit Type and Rh: 6200

## 2020-07-05 ENCOUNTER — Other Ambulatory Visit: Payer: Self-pay | Admitting: *Deleted

## 2020-07-05 NOTE — Progress Notes (Signed)
The proposed discussion at cancer conference 07/05/20 is for discussion purpose only and is not a binding recommendation.  The patient was not physically examined nor present for their treatment options  Therefore, final treatment plan cannot be decided.

## 2020-07-09 ENCOUNTER — Other Ambulatory Visit: Payer: Self-pay | Admitting: Thoracic Surgery (Cardiothoracic Vascular Surgery)

## 2020-07-09 DIAGNOSIS — C3492 Malignant neoplasm of unspecified part of left bronchus or lung: Secondary | ICD-10-CM

## 2020-07-10 ENCOUNTER — Ambulatory Visit (INDEPENDENT_AMBULATORY_CARE_PROVIDER_SITE_OTHER): Payer: Self-pay | Admitting: Thoracic Surgery (Cardiothoracic Vascular Surgery)

## 2020-07-10 ENCOUNTER — Telehealth: Payer: Self-pay

## 2020-07-10 ENCOUNTER — Encounter: Payer: Self-pay | Admitting: Thoracic Surgery (Cardiothoracic Vascular Surgery)

## 2020-07-10 ENCOUNTER — Ambulatory Visit
Admission: RE | Admit: 2020-07-10 | Discharge: 2020-07-10 | Disposition: A | Discharge status: Home / Self Care | Payer: Medicare HMO | Source: Ambulatory Visit | Attending: Thoracic Surgery (Cardiothoracic Vascular Surgery) | Admitting: Thoracic Surgery (Cardiothoracic Vascular Surgery)

## 2020-07-10 ENCOUNTER — Other Ambulatory Visit: Payer: Self-pay

## 2020-07-10 VITALS — BP 129/78 | HR 89 | Temp 97.3°F | Resp 20 | Ht 68.0 in | Wt 159.2 lb

## 2020-07-10 DIAGNOSIS — C3492 Malignant neoplasm of unspecified part of left bronchus or lung: Secondary | ICD-10-CM

## 2020-07-10 NOTE — Progress Notes (Signed)
[address]       [place] [address]             [phone]      HPI: [name] returns for follow-up after his recent lobectomy.  Elihu Milstein is a 86 year old man with a past history of monoclonal gammopathy, hypertension, hyperlipidemia, hiatal hernia, acid reflux, type 2 diabetes, stage IV chronic kidney disease, and hypothyroidism.  He was evaluated for neck pain.  His CT showed a left upper lobe nodule.  Navigational bronchoscopy showed adenocarcinoma.  I did a robotic left upper lobectomy on 06/22/2020.  He did well postoperatively and went home on day 4.  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  Since discharge he has been feeling well.  He occasionally takes a tramadol but overall is having minimal discomfort.  He is not having any shortness of breath.  He is anxious to increase his activities and start mowing his yard again.  Past Medical History:  Diagnosis Date   Acid reflux disease    Arthritis    Complication of anesthesia    appendix surgery (86 year old) - passed out once patient got back to floor unit   Diabetes mellitus    Hiatal hernia    High cholesterol    Hypertension    Hypothyroidism    Monoclonal gammopathy of unknown significance (MGUS) 03/28/2020   Normocytic anemia 03/28/2020   Renal disorder    renal insufficiency   Type 2 diabetes mellitus (HCC)     Current Outpatient Medications  Medication Sig Dispense Refill   acetaminophen (TYLENOL) 500 MG tablet Take 2 tablets (1,000 mg total) by mouth every 6 (six) hours. 30 tablet 0   amLODipine (NORVASC) 10 MG tablet Take 10 mg by mouth daily.      Cholecalciferol 50 MCG (2000 UT) TABS Take 2,000 Units by mouth daily.      docusate sodium (COLACE) 100 MG capsule Take 1 capsule (100 mg total) by mouth 2 (two) times daily as needed for mild constipation. 10 capsule 0   glipiZIDE (GLUCOTROL XL) 2.5 MG 24 hr tablet Take 2.5 mg by mouth daily as  needed (blood sugar above 160).      Lancets (ONETOUCH DELICA PLUS [id number]) MISC 2 (two) times daily.     levothyroxine (SYNTHROID) 50 MCG tablet Take 50 mcg by mouth daily before breakfast.      omeprazole (PRILOSEC) 20 MG capsule Take 20 mg by mouth daily.     ONETOUCH ULTRA test strip 2 (two) times daily.     rosuvastatin (CRESTOR) 40 MG tablet Take 40 mg by mouth daily.      tamsulosin (FLOMAX) 0.4 MG CAPS capsule Take 1 capsule (0.4 mg total) by mouth daily. 30 capsule 0   traMADol (ULTRAM) 50 MG tablet Take 1 tablet (50 mg total) by mouth every 6 (six) hours as needed (mild pain). 30 tablet 0   No current facility-administered medications for this visit.    Physical Exam BP 129/78 (BP Location: Left Arm, Patient Position: Sitting, Cuff Size: Normal)    Pulse 89    Temp (!) 97.3 F (36.3 C)    Resp 20    Ht 5\' 8"  (1.727 m)    Wt 159 lb 3.2 oz (72.2 kg)    SpO2 96% Comment: RA   BMI 24.25 kg/m  86 year old man in no acute distress Alert and oriented x3 with no focal deficits Lungs diminished  at left base but otherwise clear Incision healing well Cardiac regular rate and rhythm No peripheral edema  Diagnostic Tests: CHEST - 2 VIEW  COMPARISON:  June 26, 2020.  FINDINGS: Postoperative change noted on the left. There is a fairly small left hydropneumothorax with pneumothorax seen in the apical and apicolateral regions. No tension component. Right lung clear. Heart size normal. No adenopathy. Aortic atherosclerosis. Degenerative change in lower thoracic spine.  IMPRESSION: Fairly small hydropneumothorax on the left without tension component. Postoperative change. No edema or airspace opacity. Heart size normal. No adenopathy.  Aortic Atherosclerosis (ICD10-I70.0).  Critical Value/emergent results were called by telephone at the time of interpretation on 07/10/2020 at 12:09 pm to provider Charlena Cross, RN, who verbally acknowledged these  results.   Electronically Signed   By: Lowella Grip III M.D.   On: 07/10/2020 12:10 I personally reviewed the chest x-ray images.  He has a postoperative space, which is a normal finding postoperatively.  Impression: William Wells is an 86 year old man with history of monoclonal gammopathy, hypertension, hyperlipidemia, hiatal hernia, acid reflux, type 2 diabetes, stage IV chronic kidney disease, and hypothyroidism.  He was incidentally found to have a left upper lobe lung nodule on a CT to evaluate for right-sided neck pain.  He ultimately underwent a left upper lobectomy about 2-1/2 weeks ago.  He had a T1, N0, stage Ia adenocarcinoma.  His postoperative course was unremarkable and he went home on day 4.  He continues to do well.  He is having minimal discomfort.  He is anxious to increase his activities.  He may begin using his riding mower on a limited basis.  He should wait a couple more weeks before trying to use his push mower.  He may begin driving on a limited basis, appropriate precautions were discussed.  Plan: Follow-up with Dr. Delton Coombes scheduled Return in 2 months with PA and lateral chest x-ray to check on progress  Melrose Nakayama, MD [facility] [phone]

## 2020-07-10 NOTE — Telephone Encounter (Signed)
Dr. Theodore Demark. Radiologist with [facility] called with a call report for patient's chest xray taken today.  Advised that patient has appointment with Dr. Roxan Hockey today and he was aware. He acknowledged receipt.

## 2020-07-17 ENCOUNTER — Ambulatory Visit: Payer: Medicare HMO | Admitting: Emergency Medicine

## 2020-07-19 ENCOUNTER — Other Ambulatory Visit: Payer: Self-pay | Admitting: Physician Assistant

## 2020-07-23 ENCOUNTER — Inpatient Hospital Stay (HOSPITAL_COMMUNITY): Payer: Medicare HMO | Attending: Oncology | Admitting: Hematology

## 2020-07-23 ENCOUNTER — Other Ambulatory Visit: Payer: Self-pay

## 2020-07-23 ENCOUNTER — Encounter (HOSPITAL_COMMUNITY): Payer: Self-pay | Admitting: Hematology

## 2020-07-23 VITALS — BP 127/62 | HR 94 | Temp 96.8°F | Resp 16 | Wt 159.0 lb

## 2020-07-23 DIAGNOSIS — I10 Essential (primary) hypertension: Secondary | ICD-10-CM | POA: Diagnosis not present

## 2020-07-23 DIAGNOSIS — Z87891 Personal history of nicotine dependence: Secondary | ICD-10-CM | POA: Diagnosis not present

## 2020-07-23 DIAGNOSIS — E7800 Pure hypercholesterolemia, unspecified: Secondary | ICD-10-CM | POA: Insufficient documentation

## 2020-07-23 DIAGNOSIS — E119 Type 2 diabetes mellitus without complications: Secondary | ICD-10-CM | POA: Insufficient documentation

## 2020-07-23 DIAGNOSIS — C3492 Malignant neoplasm of unspecified part of left bronchus or lung: Secondary | ICD-10-CM | POA: Diagnosis present

## 2020-07-23 DIAGNOSIS — D472 Monoclonal gammopathy: Secondary | ICD-10-CM | POA: Insufficient documentation

## 2020-07-23 DIAGNOSIS — Z79899 Other long term (current) drug therapy: Secondary | ICD-10-CM | POA: Diagnosis not present

## 2020-07-23 DIAGNOSIS — K219 Gastro-esophageal reflux disease without esophagitis: Secondary | ICD-10-CM | POA: Diagnosis not present

## 2020-07-23 DIAGNOSIS — Z902 Acquired absence of lung [part of]: Secondary | ICD-10-CM | POA: Diagnosis not present

## 2020-07-23 DIAGNOSIS — E039 Hypothyroidism, unspecified: Secondary | ICD-10-CM | POA: Diagnosis not present

## 2020-07-23 DIAGNOSIS — Z7984 Long term (current) use of oral hypoglycemic drugs: Secondary | ICD-10-CM | POA: Diagnosis not present

## 2020-07-23 DIAGNOSIS — Z23 Encounter for immunization: Secondary | ICD-10-CM | POA: Insufficient documentation

## 2020-07-23 DIAGNOSIS — M8580 Other specified disorders of bone density and structure, unspecified site: Secondary | ICD-10-CM | POA: Diagnosis not present

## 2020-07-23 MED ORDER — HYDROCODONE-ACETAMINOPHEN 5-325 MG PO TABS
1.0000 | ORAL_TABLET | Freq: Every day | ORAL | 0 refills | Status: DC | PRN
Start: 2020-07-23 — End: 2021-09-08

## 2020-07-23 MED ORDER — INFLUENZA VAC A&B SA ADJ QUAD 0.5 ML IM PRSY
0.5000 mL | PREFILLED_SYRINGE | Freq: Once | INTRAMUSCULAR | Status: AC
  Administered 2020-07-23: 0.5 mL via INTRAMUSCULAR
  Filled 2020-07-23: qty 0.5

## 2020-07-23 NOTE — Progress Notes (Signed)
Patient tolerated flu vaccine with no complaints voiced.  Site clean and dry with no bruising or swelling noted at site.  Band aid applied.  VSS.

## 2020-07-23 NOTE — Progress Notes (Signed)
[facility] [place], [place] [address]   CLINIC:  Medical Oncology/Hematology  PCP:  Celene Squibb, MD [address] Liana Cro[place] [address] [phone]   REASON FOR VISIT:  Follow-up for left lung cancer and MGUS  PRIOR THERAPY: Left upper lung lobectomy on 06/22/2020  NGS Results: Not done  CURRENT THERAPY: Observation  BRIEF ONCOLOGIC HISTORY:  Oncology History  Adenocarcinoma of left lung, stage 1 ([place])  06/26/2020 Initial Diagnosis   Adenocarcinoma of left lung, stage 1 ([place])   06/26/2020 Cancer Staging   Staging form: Lung, AJCC 8th Edition - Clinical stage from 06/26/2020: Stage IA3 (cT1c, cN0, cM0) - Signed by Melrose Nakayama, MD on 06/26/2020     CANCER STAGING: Cancer Staging Adenocarcinoma of left lung, stage 1 ([place]) Staging form: Lung, AJCC 8th Edition - Clinical stage from 06/26/2020: Stage IA3 (cT1c, cN0, cM0) - Signed by Melrose Nakayama, MD on 06/26/2020   INTERVAL HISTORY:  William Wells, a 86 y.o. male, returns for routine follow-up of his left lung cancer and MGUS. William Wells was last seen on 06/04/2020.  Today William Wells is accompanied by his wife and William Wells reports feeling well. His energy level is still at 25%, though his appetite is good. William Wells occasionally gets some pain as well as headaches occasionally above his left eye. William Wells denies having any new pains.   REVIEW OF SYSTEMS:  Review of Systems  Constitutional: Positive for appetite change (75%) and fatigue (25%).  Cardiovascular: Positive for leg swelling (R foot swelling).  Genitourinary: Positive for frequency.   Neurological: Positive for headaches.  All other systems reviewed and are negative.   PAST MEDICAL/SURGICAL HISTORY:  Past Medical History:  Diagnosis Date   Acid reflux disease    Arthritis    Complication of anesthesia    appendix surgery (86 year old) - passed out once patient got back to floor unit   Diabetes mellitus    Hiatal hernia      High cholesterol    Hypertension    Hypothyroidism    Monoclonal gammopathy of unknown significance (MGUS) 03/28/2020   Normocytic anemia 03/28/2020   Renal disorder    renal insufficiency   Type 2 diabetes mellitus ([place])    Past Surgical History:  Procedure Laterality Date   APPENDECTOMY     BRONCHIAL BIOPSY  05/29/2020   Procedure: BRONCHIAL BIOPSIES;  Surgeon: Collene Gobble, MD;  Location: [facility] ENDOSCOPY;  Service: Pulmonary;;   BRONCHIAL BRUSHINGS  05/29/2020   Procedure: BRONCHIAL BRUSHINGS;  Surgeon: Collene Gobble, MD;  Location: [facility] ENDOSCOPY;  Service: Pulmonary;;   BRONCHIAL NEEDLE ASPIRATION BIOPSY  05/29/2020   Procedure: BRONCHIAL NEEDLE ASPIRATION BIOPSIES;  Surgeon: Collene Gobble, MD;  Location: [facility] ENDOSCOPY;  Service: Pulmonary;;   BRONCHIAL WASHINGS  05/29/2020   Procedure: BRONCHIAL WASHINGS;  Surgeon: Collene Gobble, MD;  Location: [facility] ENDOSCOPY;  Service: Pulmonary;;   EYE SURGERY     INTERCOSTAL NERVE BLOCK Left 06/22/2020   Procedure: INTERCOSTAL NERVE BLOCK;  Surgeon: Melrose Nakayama, MD;  Location: [facility];  Service: Thoracic;  Laterality: Left;   NODE DISSECTION Left 06/22/2020   Procedure: NODE DISSECTION;  Surgeon: Melrose Nakayama, MD;  Location: [facility];  Service: Thoracic;  Laterality: Left;   TONSILLECTOMY     VIDEO BRONCHOSCOPY WITH ENDOBRONCHIAL NAVIGATION N/A 05/29/2020   Procedure: VIDEO BRONCHOSCOPY WITH ENDOBRONCHIAL NAVIGATION;  Surgeon: Collene Gobble, MD;  Location: [facility] ENDOSCOPY;  Service: Pulmonary;  Laterality: N/A;    SOCIAL HISTORY:  Social History   Socioeconomic History   Marital status: Married    Spouse name: Not on file   Number of children: 4   Years of education: Not on file   Highest education level: Not on file  Occupational History   Occupation: RETIRED  Tobacco Use   Smoking status: Former Smoker    Packs/day: 0.50    Years: 47.00    Pack years: 23.50    Types: Cigarettes    Quit date:  2000    Years since quitting: 21.8   Smokeless tobacco: Never Used  Scientific laboratory technician Use: Never used  Substance and Sexual Activity   Alcohol use: No   Drug use: No   Sexual activity: Not Currently  Other Topics Concern   Not on file  Social History Narrative   Not on file   Social Determinants of Health   Financial Resource Strain:    Difficulty of Paying Living Expenses: Not on file  Food Insecurity:    Worried About Charity fundraiser in the Last Year: Not on file   [employer] of Food in the Last Year: Not on file  Transportation Needs:    Lack of Transportation (Medical): Not on file   Lack of Transportation (Non-Medical): Not on file  Physical Activity:    Days of Exercise per Week: Not on file   Minutes of Exercise per Session: Not on file  Stress:    Feeling of Stress : Not on file  Social Connections:    Frequency of Communication with Friends and Family: Not on file   Frequency of Social Gatherings with Friends and Family: Not on file   Attends Religious Services: Not on file   Active Member of Clubs or Organizations: Not on file   Attends Archivist Meetings: Not on file   Marital Status: Not on file  Intimate Partner Violence:    Fear of Current or Ex-Partner: Not on file   Emotionally Abused: Not on file   Physically Abused: Not on file   Sexually Abused: Not on file    FAMILY HISTORY:  Family History  Problem Relation Age of Onset   Diabetes Mother    Cancer Father    Cancer Sister    Diabetes Brother    Cancer Brother    Diabetes Brother    Parkinson's disease Brother    Diabetes Brother     CURRENT MEDICATIONS:  Current Outpatient Medications  Medication Sig Dispense Refill   acetaminophen (TYLENOL) 500 MG tablet Take 2 tablets (1,000 mg total) by mouth every 6 (six) hours. 30 tablet 0   amLODipine (NORVASC) 10 MG tablet Take 10 mg by mouth daily.      Cholecalciferol 50 MCG (2000 UT) TABS Take  2,000 Units by mouth daily.      docusate sodium (COLACE) 100 MG capsule Take 1 capsule (100 mg total) by mouth 2 (two) times daily as needed for mild constipation. 10 capsule 0   glipiZIDE (GLUCOTROL XL) 2.5 MG 24 hr tablet Take 2.5 mg by mouth daily as needed (blood sugar above 160).      Lancets (ONETOUCH DELICA PLUS [id number]) MISC 2 (two) times daily.     levothyroxine (SYNTHROID) 50 MCG tablet Take 50 mcg by mouth daily before breakfast.      omeprazole (PRILOSEC) 20 MG capsule Take 20 mg by mouth daily.     ONETOUCH ULTRA test strip 2 (two)  times daily.     rosuvastatin (CRESTOR) 40 MG tablet Take 40 mg by mouth daily.      tamsulosin (FLOMAX) 0.4 MG CAPS capsule Take 1 capsule (0.4 mg total) by mouth daily. 30 capsule 0   traMADol (ULTRAM) 50 MG tablet Take 1 tablet (50 mg total) by mouth every 6 (six) hours as needed (mild pain). 30 tablet 0   No current facility-administered medications for this visit.    ALLERGIES:  No Known Allergies  PHYSICAL EXAM:  Performance status (ECOG): 1 - Symptomatic but completely ambulatory  Vitals:   07/23/20 1530  BP: 127/62  Pulse: 94  Resp: 16  Temp: (!) 96.8 F (36 C)  SpO2: 100%   Wt Readings from Last 3 Encounters:  07/23/20 159 lb (72.1 kg)  07/10/20 159 lb 3.2 oz (72.2 kg)  06/22/20 158 lb 9.6 oz (71.9 kg)   Physical Exam Vitals reviewed.  Constitutional:      Appearance: Normal appearance.  Cardiovascular:     Rate and Rhythm: Normal rate and regular rhythm.     Pulses: Normal pulses.     Heart sounds: Normal heart sounds.  Pulmonary:     Effort: Pulmonary effort is normal.     Breath sounds: Normal breath sounds.  Musculoskeletal:     Right lower leg: No edema.     Left lower leg: No edema.  Neurological:     General: No focal deficit present.     Mental Status: William Wells is alert and oriented to person, place, and time.  Psychiatric:        Mood and Affect: Mood normal.        Behavior: Behavior normal.       LABORATORY DATA:  I have reviewed the labs as listed.  CBC Latest Ref Rng & Units 06/24/2020 06/23/2020 06/22/2020  WBC 4.0 - 10.5 K/uL 10.5 9.8 -  Hemoglobin 13.0 - 17.0 g/dL 11.8(L) 11.0(L) 11.2(L)  Hematocrit 39 - 52 % 35.9(L) 33.1(L) 33.0(L)  Platelets 150 - 400 K/uL 148(L) 157 -   CMP Latest Ref Rng & Units 06/25/2020 06/24/2020 06/23/2020  Glucose 70 - 99 mg/dL 153(H) 138(H) 128(H)  BUN 8 - 23 mg/dL 20 22 22   Creatinine 0.61 - 1.24 mg/dL 1.68(H) 1.72(H) 1.71(H)  Sodium 135 - 145 mmol/L 136 134(L) 137  Potassium 3.5 - 5.1 mmol/L 4.1 4.1 4.2  Chloride 98 - 111 mmol/L 104 102 106  CO2 22 - 32 mmol/L 25 24 22   Calcium 8.9 - 10.3 mg/dL 9.4 9.2 9.0  Total Protein 6.5 - 8.1 g/dL - 6.6 -  Total Bilirubin 0.3 - 1.2 mg/dL - 0.6 -  Alkaline Phos 38 - 126 U/L - 41 -  AST 15 - 41 U/L - 25 -  ALT 0 - 44 U/L - 19 -   Surgical pathology ([id number]) on 06/22/2020: Left upper lobectomy: invasive adenocarcinoma.  DIAGNOSTIC IMAGING:  I have independently reviewed the scans and discussed with the patient. DG Chest 2 View  Result Date: 07/10/2020 CLINICAL DATA:  Left-sided lung carcinoma EXAM: CHEST - 2 VIEW COMPARISON:  June 26, 2020. FINDINGS: Postoperative change noted on the left. There is a fairly small left hydropneumothorax with pneumothorax seen in the apical and apicolateral regions. No tension component. Right lung clear. Heart size normal. No adenopathy. Aortic atherosclerosis. Degenerative change in lower thoracic spine. IMPRESSION: Fairly small hydropneumothorax on the left without tension component. Postoperative change. No edema or airspace opacity. Heart size normal. No adenopathy. Aortic Atherosclerosis (ICD10-I70.0). Critical  Value/emergent results were called by telephone at the time of interpretation on 07/10/2020 at 12:09 pm to provider Charlena Cross, RN, who verbally acknowledged these results. Electronically Signed   By: Lowella Grip III M.D.   On: 07/10/2020 12:10    DG Chest 2 View  Result Date: 06/26/2020 CLINICAL DATA:  Status post lobectomy. EXAM: CHEST - 2 VIEW COMPARISON:  06/25/2020. FINDINGS: Mediastinum and hilar structures stable. Heart size stable. Postsurgical changes left lung. Density is noted in the left mid lung. On lateral view there is a small air-fluid level over this density. This could represent a fluid-filled pneumatocele or small loculated anterior hydropneumothorax. Previously identified tiny left apical pneumothorax not identified on today's exam. Tiny left pleural effusion cannot be excluded. Stable left chest wall subcutaneous emphysema. Diffuse osteopenia. Degenerative change thoracic spine. IMPRESSION: 1. Postsurgical changes left lung. Density noted over the left mid chest. On lateral view there is a small air-fluid level over this density. This could represent a fluid-filled pneumatocele or small loculated anterior hydropneumothorax. 2. Previously identified tiny left apical pneumothorax not identified on today's exam. Tiny left pleural effusion cannot be excluded. Left chest wall subcutaneous emphysema again noted. Electronically Signed   By: Marcello Moores  Register   On: 06/26/2020 06:15   DG Chest 1V REPEAT Same Day  Result Date: 06/25/2020 CLINICAL DATA:  Follow-up chest tube removal EXAM: CHEST - 1 VIEW SAME DAY COMPARISON:  Film from earlier in the same day. FINDINGS: Left-sided chest tube is been removed in the interval from the prior exam. Minimal apical pneumothorax is noted. No significant recurrence is seen. Left jugular central line is been removed as well. Subcutaneous air on the left is again identified and stable. No new effusion or consolidation is noted. Cardiac shadow is stable. Aortic calcifications are again seen. IMPRESSION: Left chest tube removal with only minimal apical pneumothorax identified. No other focal abnormality is noted. Electronically Signed   By: Inez Catalina M.D.   On: 06/25/2020 19:35   DG Chest Port 1  View  Result Date: 06/25/2020 CLINICAL DATA:  Chest tube clamped. EXAM: PORTABLE CHEST 1 VIEW COMPARISON:  06/25/2020 FINDINGS: Postoperative changes on the left. Left central line remains in place, unchanged. Left chest tube remains in place. Subcutaneous emphysema throughout the left chest wall is stable. No visible left pneumothorax. No confluent airspace opacities. Heart is normal size. IMPRESSION: No visible left pneumothorax. Subcutaneous emphysema in the left chest wall is stable. Electronically Signed   By: Rolm Baptise M.D.   On: 06/25/2020 12:16   DG CHEST PORT 1 VIEW  Result Date: 06/25/2020 CLINICAL DATA:  Pneumothorax, left chest tube EXAM: PORTABLE CHEST 1 VIEW COMPARISON:  06/24/2020 FINDINGS: Partial left lung resection with resultant left-sided volume loss is again noted. Left chest tube is in place. Lungs are clear. No pneumothorax or pleural effusion. Left chest wall subcutaneous gas is stable. Left internal jugular central venous catheter overlies the aortic knob, but was clearly separate from this structure on prior examination of 06/24/2020. Tip is unchanged, oriented horizontally within the terminal brachiocephalic vein. Cardiac size within normal limits. Pulmonary vascularity is normal. IMPRESSION: Left chest tube in place, no pneumothorax. Electronically Signed   By: Fidela Salisbury MD   On: 06/25/2020 06:44   DG CHEST PORT 1 VIEW  Result Date: 06/24/2020 CLINICAL DATA:  Follow-up pneumothorax. EXAM: PORTABLE CHEST 1 VIEW COMPARISON:  06/23/2020 FINDINGS: There is a left IJ catheter with tip in the left innominate vein. Left-sided chest tube is stable  in position. No significant pneumothorax identified. Unchanged left chest wall subcutaneous emphysema. Stable cardiomediastinal contours. No pleural effusion, airspace consolidation or atelectasis. IMPRESSION: 1. Left chest tube in place without significant pneumothorax. 2. Stable left chest wall subcutaneous emphysema. Electronically  Signed   By: Kerby Moors M.D.   On: 06/24/2020 08:33     ASSESSMENT:  1.IgM kappa MGUS: -Serum immunofixation shows IgM kappa. -SPEP was negative. Free kappa light chains of 48.5, lambda light chains 24.8, ratio of 1.96. -Beta-2 microglobulin 3.2. LDH 105. -Creatinine 1.95 and calcium 9.7. -24-hour urine total protein was 572 mg. M spike was negative. -Skeletal survey on 03/29/2020 did not show any lytic lesions. -PET scan on 04/09/2020 did not show any skeletal abnormalities.  2.  PT 1 CPN 0left lung adenocarcinoma: -William Wells quit smoking in 1999, smoked about 46 pack years. -PET scan on 04/09/2020 showed 2.1 x 2.1 cm left apical nodule with SUV 2.2. Possibility of low-grade adenocarcinoma. -Navigational bronchoscopy and biopsy of the left upper lobe lung lesion on 05/29/2020 consistent with adenocarcinoma. -Left upper lobectomy and lymph node biopsy on 06/22/2020.  Pathology shows invasive adenocarcinoma, 3 cm, no visceral pleural invasion identified.  Resection margins negative.  0/4 hilar lymph nodes negative for carcinoma.   PLAN:  1.IgM kappa MGUS: -William Wells has MGUS but did not have any "crab" features.  No indication for treatment. -We will plan to repeat his myeloma labs in 3 to 4 months.  2. Left lung adenocarcinoma: -William Wells is recovering from surgery. -We discussed the pathology report in detail.  No adjuvant chemotherapy needed. -We will continue monitoring CT scans of the chest every 6 months. -We will arrange for next scan in 3 to 4 months.  3.  Left-sided headaches: -William Wells has chronic left-sided headaches for multiple years.  William Wells reportedly takes hydrocodone when the pain is severe.  William Wells is currently taking tramadol which is not completely helping. -I will give a refill for it.   Orders placed this encounter:  Orders Placed This Encounter  Procedures   CT Chest W Contrast   CBC with Differential/Platelet   Comprehensive metabolic panel   Kappa/lambda light chains    Protein electrophoresis, serum     Derek Jack, MD [place] [phone]   I, Milinda Antis, am acting as a scribe for Dr. Sanda Linger.  I, Derek Jack MD, have reviewed the above documentation for accuracy and completeness, and I agree with the above.

## 2020-07-23 NOTE — Patient Instructions (Signed)
[place] at [facility] ([facility]) Discharge Instructions  You were seen today by Dr. Delton Coombes. He went over your recent results. You will be scheduled for a CT scan of your chest before your next visit. Dr. Delton Coombes will see you back in 3 months for labs and follow up.   Thank you for choosing [place] at [facility] to provide your oncology and hematology care.  To afford each patient quality time with our provider, please arrive at least 15 minutes before your scheduled appointment time.   If you have a lab appointment with the [facility] please come in thru the Main Entrance and check in at the main information desk  You need to re-schedule your appointment should you arrive 10 or more minutes late.  We strive to give you quality time with our providers, and arriving late affects you and other patients whose appointments are after yours.  Also, if you no show three or more times for appointments you may be dismissed from the clinic at the providers discretion.     Again, thank you for choosing [facility].  Our hope is that these requests will decrease the amount of time that you wait before being seen by our physicians.       _____________________________________________________________  Should you have questions after your visit to [facility], please contact our office at [phone] between the hours of 8:00 a.m. and 4:30 p.m.  Voicemails left after 4:00 p.m. will not be returned until the following business day.  For prescription refill requests, have your pharmacy contact our office and allow 72 hours.    Cancer Center Support Programs:   > Cancer Support Group  2nd Tuesday of the month 1pm-2pm, Journey Room

## 2020-09-10 ENCOUNTER — Other Ambulatory Visit: Payer: Self-pay | Admitting: Thoracic Surgery (Cardiothoracic Vascular Surgery)

## 2020-09-10 DIAGNOSIS — C3492 Malignant neoplasm of unspecified part of left bronchus or lung: Secondary | ICD-10-CM

## 2020-09-11 ENCOUNTER — Other Ambulatory Visit: Payer: Self-pay

## 2020-09-11 ENCOUNTER — Ambulatory Visit
Admission: RE | Admit: 2020-09-11 | Discharge: 2020-09-11 | Disposition: A | Discharge status: Home / Self Care | Payer: Medicare HMO | Source: Ambulatory Visit | Attending: Thoracic Surgery (Cardiothoracic Vascular Surgery) | Admitting: Thoracic Surgery (Cardiothoracic Vascular Surgery)

## 2020-09-11 ENCOUNTER — Ambulatory Visit (INDEPENDENT_AMBULATORY_CARE_PROVIDER_SITE_OTHER): Payer: Self-pay | Admitting: Thoracic Surgery (Cardiothoracic Vascular Surgery)

## 2020-09-11 VITALS — BP 132/75 | HR 90 | Temp 97.6°F | Resp 20 | Ht 68.0 in | Wt 158.0 lb

## 2020-09-11 DIAGNOSIS — C3492 Malignant neoplasm of unspecified part of left bronchus or lung: Secondary | ICD-10-CM

## 2020-09-11 NOTE — Progress Notes (Signed)
[address]       [place] [address]             [phone]       HPI: William Wells returns for a scheduled follow-up visit  William Wells is an 86 year old man with a past medical history significant for monoclonal gammopathy, hypertension, hyperlipidemia, hiatal hernia, acid reflux, type 2 diabetes, stage IV chronic kidney disease, and hypothyroidism.  He presented with neck pain and a CT of the neck was done which showed a left upper lobe nodule.  Further work-up showed the nodule was hypermetabolic.  He had a navigational bronchoscopy and biopsy showed adenocarcinoma.  I did a robotic left upper lobectomy and node dissection on 06/22/2020.  He did well postoperatively and went home on day 4.  The nodule was a T1c, N0, stage Ia adenocarcinoma.  I last saw him in the office on 07/10/2020.  He was doing well at that time.  He was anxious to resume full activities.  Currently he is still doing well.  He has some occasional discomfort with his incisions but nothing severe.  He has not had any respiratory issues.  He did see Dr. Delton Coombes.  No adjuvant therapy was indicated.  Past Medical History:  Diagnosis Date  . Acid reflux disease   . Arthritis   . Complication of anesthesia    appendix surgery (86 year old) - passed out once patient got back to floor unit  . Diabetes mellitus   . Hiatal hernia   . High cholesterol   . Hypertension   . Hypothyroidism   . Monoclonal gammopathy of unknown significance (MGUS) 03/28/2020  . Normocytic anemia 03/28/2020  . Renal disorder    renal insufficiency  . Type 2 diabetes mellitus ([place])     Current Outpatient Medications  Medication Sig Dispense Refill  . acetaminophen (TYLENOL) 500 MG tablet Take 2 tablets (1,000 mg total) by mouth every 6 (six) hours. 30 tablet 0  . amLODipine (NORVASC) 10 MG tablet Take 10 mg by mouth daily.     . Cholecalciferol 50 MCG (2000 UT) TABS Take 2,000 Units by mouth daily.     Marland Kitchen docusate  sodium (COLACE) 100 MG capsule Take 1 capsule (100 mg total) by mouth 2 (two) times daily as needed for mild constipation. 10 capsule 0  . glipiZIDE (GLUCOTROL XL) 2.5 MG 24 hr tablet Take 2.5 mg by mouth daily as needed (blood sugar above 160).     Marland Kitchen HYDROcodone-acetaminophen (NORCO) 5-325 MG tablet Take 1 tablet by mouth daily as needed for moderate pain. 30 tablet 0  . Lancets (ONETOUCH DELICA PLUS [id number]) MISC 2 (two) times daily.    Marland Kitchen levothyroxine (SYNTHROID) 50 MCG tablet Take 50 mcg by mouth daily before breakfast.     . omeprazole (PRILOSEC) 20 MG capsule Take 20 mg by mouth daily.    Glory Rosebush ULTRA test strip 2 (two) times daily.    . rosuvastatin (CRESTOR) 40 MG tablet Take 40 mg by mouth daily.     . tamsulosin (FLOMAX) 0.4 MG CAPS capsule Take 1 capsule (0.4 mg total) by mouth daily. 30 capsule 0  . traMADol (ULTRAM) 50 MG tablet Take 1 tablet (50 mg total) by mouth every 6 (six) hours as needed (mild pain). 30 tablet 0   No current facility-administered medications for this visit.    Physical Exam BP 132/75   Pulse 90   Temp 97.6 F (36.4 C) (Skin)  Resp 20   Ht 5\' 8"  (1.727 m)   Wt 158 lb (71.7 kg)   SpO2 95% Comment: RA  BMI 24.15 kg/m  86 year old man in no acute distress Alert and oriented x3 with no focal deficits Lungs diminished at left base but otherwise clear Cardiac regular rate and rhythm normal S1 and S2   Diagnostic Tests: CHEST - 2 VIEW  COMPARISON:  07/10/2020  FINDINGS: Postoperative changes on the left. Previously seen left pneumothorax has resolved. Volume loss on the left with scarring at the left base. Right lung clear. Heart is normal size.  IMPRESSION: Postoperative changes with volume loss and scarring on the left. No acute cardiopulmonary disease.   Electronically Signed   By: Rolm Baptise M.D.   On: 09/11/2020 12:04  Impression: William Wells is an 86 year old man with a past medical history significant for monoclonal  gammopathy, hypertension, hyperlipidemia, hiatal hernia, acid reflux, type 2 diabetes, stage IV chronic kidney disease, and hypothyroidism.  He does have a past history of smoking but quit in 2000.  He was being evaluated for pain in the right side of his neck and was incidentally found to have a nodule in his left upper lobe.  Further work-up showed that was hypermetabolic.  Biopsy showed adenocarcinoma.  I did a robotic left upper lobectomy and node dissection on 06/22/2020.  His postoperative course was uncomplicated.  Pathology showed a stage Ia (T1, N0) adenocarcinoma.  William Wells continues to do well.  He has minimal discomfort from the surgery.  His functional status is quite good.  No adjuvant therapy was indicated.  Dr. Delton Coombes will continue to follow him with CT scans of the chest every 6 months.  Plan: Follow-up as scheduled with Dr. Delton Coombes Return in 9 months with PA and lateral chest x-ray for 1 year follow-up  Melrose Nakayama, MD [facility] [phone]

## 2020-10-17 ENCOUNTER — Other Ambulatory Visit: Payer: Self-pay

## 2020-10-17 ENCOUNTER — Inpatient Hospital Stay (HOSPITAL_COMMUNITY): Payer: Medicare HMO | Attending: Hematology

## 2020-10-17 DIAGNOSIS — K219 Gastro-esophageal reflux disease without esophagitis: Secondary | ICD-10-CM | POA: Insufficient documentation

## 2020-10-17 DIAGNOSIS — D472 Monoclonal gammopathy: Secondary | ICD-10-CM | POA: Insufficient documentation

## 2020-10-17 DIAGNOSIS — C3492 Malignant neoplasm of unspecified part of left bronchus or lung: Secondary | ICD-10-CM

## 2020-10-17 DIAGNOSIS — E039 Hypothyroidism, unspecified: Secondary | ICD-10-CM | POA: Insufficient documentation

## 2020-10-17 DIAGNOSIS — E7800 Pure hypercholesterolemia, unspecified: Secondary | ICD-10-CM | POA: Insufficient documentation

## 2020-10-17 DIAGNOSIS — M1990 Unspecified osteoarthritis, unspecified site: Secondary | ICD-10-CM | POA: Diagnosis not present

## 2020-10-17 DIAGNOSIS — I10 Essential (primary) hypertension: Secondary | ICD-10-CM | POA: Diagnosis not present

## 2020-10-17 DIAGNOSIS — Z79899 Other long term (current) drug therapy: Secondary | ICD-10-CM | POA: Diagnosis not present

## 2020-10-17 DIAGNOSIS — Z7984 Long term (current) use of oral hypoglycemic drugs: Secondary | ICD-10-CM | POA: Insufficient documentation

## 2020-10-17 DIAGNOSIS — E119 Type 2 diabetes mellitus without complications: Secondary | ICD-10-CM | POA: Insufficient documentation

## 2020-10-17 DIAGNOSIS — Z87891 Personal history of nicotine dependence: Secondary | ICD-10-CM | POA: Insufficient documentation

## 2020-10-17 LAB — COMPREHENSIVE METABOLIC PANEL
ALT: 29 U/L (ref 0–44)
AST: 23 U/L (ref 15–41)
Albumin: 3.9 g/dL (ref 3.5–5.0)
Alkaline Phosphatase: 61 U/L (ref 38–126)
Anion gap: 7 (ref 5–15)
BUN: 28 mg/dL — ABNORMAL HIGH (ref 8–23)
CO2: 26 mmol/L (ref 22–32)
Calcium: 10.3 mg/dL (ref 8.9–10.3)
Chloride: 103 mmol/L (ref 98–111)
Creatinine, Ser: 1.88 mg/dL — ABNORMAL HIGH (ref 0.61–1.24)
GFR, Estimated: 35 mL/min — ABNORMAL LOW (ref >60)
Glucose, Bld: 147 mg/dL — ABNORMAL HIGH (ref 70–99)
Potassium: 4.7 mmol/L (ref 3.5–5.1)
Sodium: 136 mmol/L (ref 135–145)
Total Bilirubin: 0.7 mg/dL (ref 0.3–1.2)
Total Protein: 8.1 g/dL (ref 6.5–8.1)

## 2020-10-17 LAB — CBC WITH DIFFERENTIAL/PLATELET
Abs Immature Granulocytes: 0.03 10*3/uL (ref 0.00–0.07)
Basophils Absolute: 0.1 10*3/uL (ref 0.0–0.1)
Basophils Relative: 1 %
Eosinophils Absolute: 0.4 10*3/uL (ref 0.0–0.5)
Eosinophils Relative: 4 %
HCT: 39.6 % (ref 39.0–52.0)
Hemoglobin: 12.9 g/dL — ABNORMAL LOW (ref 13.0–17.0)
Immature Granulocytes: 0 %
Lymphocytes Relative: 21 %
Lymphs Abs: 1.7 10*3/uL (ref 0.7–4.0)
MCH: 28.7 pg (ref 26.0–34.0)
MCHC: 32.6 g/dL (ref 30.0–36.0)
MCV: 88.2 fL (ref 80.0–100.0)
Monocytes Absolute: 0.7 10*3/uL (ref 0.1–1.0)
Monocytes Relative: 9 %
Neutro Abs: 5.3 10*3/uL (ref 1.7–7.7)
Neutrophils Relative %: 65 %
Platelets: 205 10*3/uL (ref 150–400)
RBC: 4.49 MIL/uL (ref 4.22–5.81)
RDW: 14.4 % (ref 11.5–15.5)
WBC: 8.2 10*3/uL (ref 4.0–10.5)
nRBC: 0 % (ref 0.0–0.2)

## 2020-10-18 LAB — KAPPA/LAMBDA LIGHT CHAINS
Kappa free light chain: 73 mg/L — ABNORMAL HIGH (ref 3.3–19.4)
Kappa, lambda light chain ratio: 1.97 — ABNORMAL HIGH (ref 0.26–1.65)
Lambda free light chains: 37 mg/L — ABNORMAL HIGH (ref 5.7–26.3)

## 2020-10-19 LAB — PROTEIN ELECTROPHORESIS, SERUM
A/G Ratio: 0.9 (ref 0.7–1.7)
Albumin ELP: 3.7 g/dL (ref 2.9–4.4)
Alpha-1-Globulin: 0.3 g/dL (ref 0.0–0.4)
Alpha-2-Globulin: 1 g/dL (ref 0.4–1.0)
Beta Globulin: 0.9 g/dL (ref 0.7–1.3)
Gamma Globulin: 1.8 g/dL (ref 0.4–1.8)
Globulin, Total: 4 g/dL — ABNORMAL HIGH (ref 2.2–3.9)
M-Spike, %: 0.4 g/dL — ABNORMAL HIGH
Total Protein ELP: 7.7 g/dL (ref 6.0–8.5)

## 2020-10-22 ENCOUNTER — Ambulatory Visit (HOSPITAL_COMMUNITY): Payer: Medicare HMO

## 2020-10-23 ENCOUNTER — Ambulatory Visit (HOSPITAL_COMMUNITY): Payer: Medicare HMO

## 2020-10-24 ENCOUNTER — Other Ambulatory Visit: Payer: Self-pay

## 2020-10-24 ENCOUNTER — Ambulatory Visit (HOSPITAL_COMMUNITY)
Admission: RE | Admit: 2020-10-24 | Discharge: 2020-10-24 | Disposition: A | Discharge status: Home / Self Care | Payer: Medicare HMO | Source: Ambulatory Visit | Attending: Hematology | Admitting: Hematology

## 2020-10-24 DIAGNOSIS — C3492 Malignant neoplasm of unspecified part of left bronchus or lung: Secondary | ICD-10-CM | POA: Diagnosis not present

## 2020-10-24 MED ORDER — IOHEXOL 300 MG/ML  SOLN
75.0000 mL | Freq: Once | INTRAMUSCULAR | Status: AC | PRN
  Administered 2020-10-24: 60 mL via INTRAVENOUS

## 2020-10-25 ENCOUNTER — Inpatient Hospital Stay (HOSPITAL_BASED_OUTPATIENT_CLINIC_OR_DEPARTMENT_OTHER): Payer: Medicare HMO | Admitting: Hematology

## 2020-10-25 ENCOUNTER — Encounter (HOSPITAL_COMMUNITY): Payer: Self-pay | Admitting: Hematology

## 2020-10-25 VITALS — BP 144/53 | HR 90 | Temp 96.9°F | Resp 18 | Wt 164.0 lb

## 2020-10-25 DIAGNOSIS — C3492 Malignant neoplasm of unspecified part of left bronchus or lung: Secondary | ICD-10-CM

## 2020-10-25 NOTE — Patient Instructions (Addendum)
[place] at [facility] Discharge Instructions  You were seen today by Dr. Delton Coombes. He went over your recent results and scans. You will be scheduled for a CT scan of your chest before your next visit. Drink 1-2 liters of water daily to keep your kidneys flushed. Dr. Delton Coombes will see you back in 3 months for labs and follow up.   Thank you for choosing [place] at [facility] to provide your oncology and hematology care.  To afford each patient quality time with our provider, please arrive at least 15 minutes before your scheduled appointment time.   If you have a lab appointment with the [facility] please come in thru the Main Entrance and check in at the main information desk  You need to re-schedule your appointment should you arrive 10 or more minutes late.  We strive to give you quality time with our providers, and arriving late affects you and other patients whose appointments are after yours.  Also, if you no show three or more times for appointments you may be dismissed from the clinic at the providers discretion.     Again, thank you for choosing [facility].  Our hope is that these requests will decrease the amount of time that you wait before being seen by our physicians.       _____________________________________________________________  Should you have questions after your visit to [facility], please contact our office at [phone] between the hours of 8:00 a.m. and 4:30 p.m.  Voicemails left after 4:00 p.m. will not be returned until the following business day.  For prescription refill requests, have your pharmacy contact our office and allow 72 hours.    Cancer Center Support Programs:   > Cancer Support Group  2nd Tuesday of the month 1pm-2pm, Journey Room

## 2020-10-25 NOTE — Progress Notes (Signed)
[place], [place] [address]   CLINIC:  Medical Oncology/Hematology  PCP:  Celene Squibb, MD [address] [place] [address] [phone]   REASON FOR VISIT:  Follow-up for left lung cancer and MGUS  PRIOR THERAPY: Left upper lung lobectomy on 06/22/2020  NGS Results: Not done  CURRENT THERAPY: Observation  BRIEF ONCOLOGIC HISTORY:  Oncology History  Adenocarcinoma of left lung, stage 1 ([place])  06/26/2020 Initial Diagnosis   Adenocarcinoma of left lung, stage 1 ([place])   06/26/2020 Cancer Staging   Staging form: Lung, AJCC 8th Edition - Clinical stage from 06/26/2020: Stage IA3 (cT1c, cN0, cM0) - Signed by Melrose Nakayama, MD on 06/26/2020     CANCER STAGING: Cancer Staging Adenocarcinoma of left lung, stage 1 ([place]) Staging form: Lung, AJCC 8th Edition - Clinical stage from 06/26/2020: Stage IA3 (cT1c, cN0, cM0) - Signed by Melrose Nakayama, MD on 06/26/2020   INTERVAL HISTORY:  William Wells, a 87 y.o. male, returns for routine follow-up of his left lung cancer and MGUS. William Wells was last seen on 07/23/2020.   Today he reports feeling well. He denies having CP and his headaches are intermittent and stable; he reports having occasional pain over his incision sites during his lobectomy. He denies having any new bone pains or recent infections, including pneumonia.   REVIEW OF SYSTEMS:  Review of Systems  Constitutional: Positive for fatigue (75%). Negative for appetite change.  Cardiovascular: Negative for chest pain.  Musculoskeletal: Negative for arthralgias.  Neurological: Positive for headaches (stable & intermittent).  All other systems reviewed and are negative.   PAST MEDICAL/SURGICAL HISTORY:  Past Medical History:  Diagnosis Date  . Acid reflux disease   . Arthritis   . Complication of anesthesia    appendix surgery (87 year old) - passed out once patient got back to floor unit  . Diabetes  mellitus   . Hiatal hernia   . High cholesterol   . Hypertension   . Hypothyroidism   . Monoclonal gammopathy of unknown significance (MGUS) 03/28/2020  . Normocytic anemia 03/28/2020  . Renal disorder    renal insufficiency  . Type 2 diabetes mellitus ([place])    Past Surgical History:  Procedure Laterality Date  . APPENDECTOMY    . BRONCHIAL BIOPSY  05/29/2020   Procedure: BRONCHIAL BIOPSIES;  Surgeon: Collene Gobble, MD;  Location: [facility] ENDOSCOPY;  Service: Pulmonary;;  . BRONCHIAL BRUSHINGS  05/29/2020   Procedure: BRONCHIAL BRUSHINGS;  Surgeon: Collene Gobble, MD;  Location: [facility] ENDOSCOPY;  Service: Pulmonary;;  . BRONCHIAL NEEDLE ASPIRATION BIOPSY  05/29/2020   Procedure: BRONCHIAL NEEDLE ASPIRATION BIOPSIES;  Surgeon: Collene Gobble, MD;  Location: [facility];  Service: Pulmonary;;  . BRONCHIAL WASHINGS  05/29/2020   Procedure: BRONCHIAL WASHINGS;  Surgeon: Collene Gobble, MD;  Location: [facility] ENDOSCOPY;  Service: Pulmonary;;  . EYE SURGERY    . INTERCOSTAL NERVE BLOCK Left 06/22/2020   Procedure: INTERCOSTAL NERVE BLOCK;  Surgeon: Melrose Nakayama, MD;  Location: [facility];  Service: Thoracic;  Laterality: Left;  . NODE DISSECTION Left 06/22/2020   Procedure: NODE DISSECTION;  Surgeon: Melrose Nakayama, MD;  Location: [facility];  Service: Thoracic;  Laterality: Left;  . TONSILLECTOMY    . VIDEO BRONCHOSCOPY WITH ENDOBRONCHIAL NAVIGATION N/A 05/29/2020   Procedure: VIDEO BRONCHOSCOPY WITH ENDOBRONCHIAL NAVIGATION;  Surgeon: Collene Gobble, MD;  Location: [facility] ENDOSCOPY;  Service: Pulmonary;  Laterality: N/A;  SOCIAL HISTORY:  Social History   Socioeconomic History  . Marital status: Married    Spouse name: Not on file  . Number of children: 4  . Years of education: Not on file  . Highest education level: Not on file  Occupational History  . Occupation: RETIRED  Tobacco Use  . Smoking status: Former Smoker    Packs/day: 0.50    Years: 47.00    Pack years: 23.50    Types:  Cigarettes    Quit date: 2000    Years since quitting: 22.0  . Smokeless tobacco: Never Used  Vaping Use  . Vaping Use: Never used  Substance and Sexual Activity  . Alcohol use: No  . Drug use: No  . Sexual activity: Not Currently  Other Topics Concern  . Not on file  Social History Narrative  . Not on file   Social Determinants of Health   Financial Resource Strain: Low Risk   . Difficulty of Paying Living Expenses: Not hard at all  Food Insecurity: No Food Insecurity  . Worried About Charity fundraiser in the Last Year: Never true  . Ran Out of Food in the Last Year: Never true  Transportation Needs: No Transportation Needs  . Lack of Transportation (Medical): No  . Lack of Transportation (Non-Medical): No  Physical Activity: Sufficiently Active  . Days of Exercise per Week: 7 days  . Minutes of Exercise per Session: 30 min  Stress: No Stress Concern Present  . Feeling of Stress : Only a little  Social Connections: Moderately Integrated  . Frequency of Communication with Friends and Family: More than three times a week  . Frequency of Social Gatherings with Friends and Family: More than three times a week  . Attends Religious Services: More than 4 times per year  . Active Member of Clubs or Organizations: No  . Attends Archivist Meetings: Not on file  . Marital Status: Married  Human resources officer Violence: Not At Risk  . Fear of Current or Ex-Partner: No  . Emotionally Abused: No  . Physically Abused: No  . Sexually Abused: No    FAMILY HISTORY:  Family History  Problem Relation Age of Onset  . Diabetes Mother   . Cancer Father   . Cancer Sister   . Diabetes Brother   . Cancer Brother   . Diabetes Brother   . Parkinson's disease Brother   . Diabetes Brother     CURRENT MEDICATIONS:  Current Outpatient Medications  Medication Sig Dispense Refill  . acetaminophen (TYLENOL) 500 MG tablet Take 2 tablets (1,000 mg total) by mouth every 6 (six) hours.  30 tablet 0  . Cholecalciferol 50 MCG (2000 UT) TABS Take 2,000 Units by mouth daily.     Marland Kitchen glipiZIDE (GLUCOTROL XL) 2.5 MG 24 hr tablet Take 2.5 mg by mouth daily as needed (blood sugar above 160).     Marland Kitchen HYDROcodone-acetaminophen (NORCO) 5-325 MG tablet Take 1 tablet by mouth daily as needed for moderate pain. 30 tablet 0  . Lancets (ONETOUCH DELICA PLUS [id number]) MISC 2 (two) times daily.    Marland Kitchen levothyroxine (SYNTHROID) 50 MCG tablet Take 50 mcg by mouth daily before breakfast.     . LOKELMA 5 g packet Take by mouth.    Marland Kitchen omeprazole (PRILOSEC) 20 MG capsule Take 20 mg by mouth daily.    Glory Rosebush ULTRA test strip 2 (two) times daily.    . rosuvastatin (CRESTOR) 40 MG tablet Take 40 mg  by mouth daily.     . tamsulosin (FLOMAX) 0.4 MG CAPS capsule Take 1 capsule (0.4 mg total) by mouth daily. 30 capsule 0  . traMADol (ULTRAM) 50 MG tablet Take 1 tablet (50 mg total) by mouth every 6 (six) hours as needed (mild pain). 30 tablet 0  . amLODipine (NORVASC) 10 MG tablet Take 10 mg by mouth daily.     Marland Kitchen docusate sodium (COLACE) 100 MG capsule Take 1 capsule (100 mg total) by mouth 2 (two) times daily as needed for mild constipation. (Patient not taking: Reported on 10/25/2020) 10 capsule 0   No current facility-administered medications for this visit.    ALLERGIES:  No Known Allergies  PHYSICAL EXAM:  Performance status (ECOG): 1 - Symptomatic but completely ambulatory  Vitals:   10/25/20 1450  BP: (!) 144/53  Pulse: 90  Resp: 18  Temp: (!) 96.9 F (36.1 C)  SpO2: 100%   Wt Readings from Last 3 Encounters:  10/25/20 164 lb (74.4 kg)  09/11/20 158 lb (71.7 kg)  07/23/20 159 lb (72.1 kg)   Physical Exam   LABORATORY DATA:  I have reviewed the labs as listed.  CBC Latest Ref Rng & Units 10/17/2020 06/24/2020 06/23/2020  WBC 4.0 - 10.5 K/uL 8.2 10.5 9.8  Hemoglobin 13.0 - 17.0 g/dL 12.9(L) 11.8(L) 11.0(L)  Hematocrit 39.0 - 52.0 % 39.6 35.9(L) 33.1(L)  Platelets 150 - 400 K/uL 205  148(L) 157   CMP Latest Ref Rng & Units 10/17/2020 06/25/2020 06/24/2020  Glucose 70 - 99 mg/dL 147(H) 153(H) 138(H)  BUN 8 - 23 mg/dL 28(H) 20 22  Creatinine 0.61 - 1.24 mg/dL 1.88(H) 1.68(H) 1.72(H)  Sodium 135 - 145 mmol/L 136 136 134(L)  Potassium 3.5 - 5.1 mmol/L 4.7 4.1 4.1  Chloride 98 - 111 mmol/L 103 104 102  CO2 22 - 32 mmol/L 26 25 24   Calcium 8.9 - 10.3 mg/dL 10.3 9.4 9.2  Total Protein 6.5 - 8.1 g/dL 8.1 - 6.6  Total Bilirubin 0.3 - 1.2 mg/dL 0.7 - 0.6  Alkaline Phos 38 - 126 U/L 61 - 41  AST 15 - 41 U/L 23 - 25  ALT 0 - 44 U/L 29 - 19   Lab Results  Component Value Date   TOTALPROTELP 7.7 10/17/2020   ALBUMINELP 3.7 10/17/2020   A1GS 0.3 10/17/2020   A2GS 1.0 10/17/2020   BETS 0.9 10/17/2020   GAMS 1.8 10/17/2020   MSPIKE 0.4 (H) 10/17/2020   SPEI Comment 10/17/2020    Lab Results  Component Value Date   KPAFRELGTCHN 73.0 (H) 10/17/2020   LAMBDASER 37.0 (H) 10/17/2020   KAPLAMBRATIO 1.97 (H) 10/17/2020    DIAGNOSTIC IMAGING:  I have independently reviewed the scans and discussed with the patient. CT Chest W Contrast  Result Date: 10/24/2020 CLINICAL DATA:  History of non-small cell lung cancer. Status post left upper lobe lobectomy on 06/22/2020 EXAM: CT CHEST WITH CONTRAST TECHNIQUE: Multidetector CT imaging of the chest was performed during intravenous contrast administration. CONTRAST:  103mL OMNIPAQUE IOHEXOL 300 MG/ML  SOLN COMPARISON:  Preoperative chest CT 05/22/2020 and PET-CT 04/09/2020 FINDINGS: Cardiovascular: The heart is normal in size. No pericardial effusion. Stable mild tortuosity and moderate atherosclerotic calcifications involving the thoracic aorta and branch vessels. No focal aneurysm or dissection. Mediastinum/Nodes: Small scattered mediastinal and hilar lymph nodes are stable. No mass or overt adenopathy. The esophagus is grossly normal. Lungs/Pleura: Postoperative changes from a left upper lobe lobectomy. There is a moderate-sized loculated  appearing left pleural  effusion. I do not see any enhancing pleural nodules. Stable severe emphysematous changes and pulmonary scarring. No new pulmonary lesions or pulmonary nodules to suggest metastatic disease. Upper Abdomen: No significant upper abdominal findings. No evidence of upper abdominal metastatic disease. Moderate aortic calcifications. Musculoskeletal: No chest wall mass, supraclavicular or axillary adenopathy. The bony thorax is intact. No worrisome lytic or sclerotic bone lesions. IMPRESSION: 1. Postoperative changes from a left upper lobe lobectomy. 2. Moderate-sized loculated appearing left pleural effusion. No enhancing pleural nodules are identified. 3. Stable severe emphysematous changes and pulmonary scarring. No new pulmonary lesions or pulmonary nodules to suggest metastatic disease. 4. Stable small scattered mediastinal and hilar lymph nodes. 5. No findings for upper abdominal metastatic disease. 6. Emphysema and aortic atherosclerosis. Aortic Atherosclerosis (ICD10-I70.0) and Emphysema (ICD10-J43.9). Electronically Signed   By: Marijo Sanes M.D.   On: 10/24/2020 09:33     ASSESSMENT:  1.IgM kappa MGUS: -Serum immunofixation shows IgM kappa. -SPEP was negative. Free kappa light chains of 48.5, lambda light chains 24.8, ratio of 1.96. -Beta-2 microglobulin 3.2. LDH 105. -Creatinine 1.95 and calcium 9.7. -24-hour urine total protein was 572 mg. M spike was negative. -Skeletal survey on 03/29/2020 did not show any lytic lesions. -PET scan on 04/09/2020 did not show any skeletal abnormalities.  2.  PT 1 CPN 0left lungadenocarcinoma: -He quit smoking in 1999, smoked about 46 pack years. -PET scan on 04/09/2020 showed 2.1 x 2.1 cm left apical nodule with SUV 2.2. Possibility of low-grade adenocarcinoma. -Navigational bronchoscopy and biopsy of the left upper lobe lung lesion on 05/29/2020 consistent with adenocarcinoma. -Left upper lobectomy and lymph node biopsy on  06/22/2020.  Pathology shows invasive adenocarcinoma, 3 cm, no visceral pleural invasion identified.  Resection margins negative.  0/4 hilar lymph nodes negative for carcinoma. - CT chest with contrast on 10/24/2020 shows postoperative changes with moderate sized loculated appearing left pleural effusion with no enhancing pleural nodules.  Stable small scattered mediastinal and hilar lymph nodes.   PLAN:  1.IgM kappa MGUS: -Reviewed labs from 10/17/2020.  Free light chain ratio is 1.97.  Hemoglobin 12.9.  M spike is 0.4 g.  Creatinine 1.88 and calcium 10.3. - No "crab" features at this time to warrant further work-up.  2. Left lungadenocarcinoma: -Reviewed CT chest with contrast from 10/24/2020 which showed a new moderate sized loculated appearing left pleural effusion with no enhancing pleural nodules. - He does not report any chest pains. - I have reviewed older films.  This effusion has been present since December last year. - Recommend follow-up CT scan in 3 months.    Orders placed this encounter:  No orders of the defined types were placed in this encounter.    Derek Jack, MD [place] [phone]   I, Milinda Antis, am acting as a scribe for Dr. Sanda Linger.  I, Derek Jack MD, have reviewed the above documentation for accuracy and completeness, and I agree with the above.

## 2021-01-16 ENCOUNTER — Other Ambulatory Visit: Payer: Self-pay

## 2021-01-16 ENCOUNTER — Inpatient Hospital Stay (HOSPITAL_COMMUNITY): Payer: Medicare HMO | Attending: Hematology

## 2021-01-16 DIAGNOSIS — Z902 Acquired absence of lung [part of]: Secondary | ICD-10-CM | POA: Diagnosis not present

## 2021-01-16 DIAGNOSIS — Z87891 Personal history of nicotine dependence: Secondary | ICD-10-CM | POA: Insufficient documentation

## 2021-01-16 DIAGNOSIS — C9000 Multiple myeloma not having achieved remission: Secondary | ICD-10-CM | POA: Insufficient documentation

## 2021-01-16 DIAGNOSIS — Z79899 Other long term (current) drug therapy: Secondary | ICD-10-CM | POA: Insufficient documentation

## 2021-01-16 DIAGNOSIS — C3492 Malignant neoplasm of unspecified part of left bronchus or lung: Secondary | ICD-10-CM | POA: Diagnosis not present

## 2021-01-16 LAB — COMPREHENSIVE METABOLIC PANEL
ALT: 26 U/L (ref 0–44)
AST: 18 U/L (ref 15–41)
Albumin: 3.7 g/dL (ref 3.5–5.0)
Alkaline Phosphatase: 53 U/L (ref 38–126)
Anion gap: 8 (ref 5–15)
BUN: 27 mg/dL — ABNORMAL HIGH (ref 8–23)
CO2: 26 mmol/L (ref 22–32)
Calcium: 10 mg/dL (ref 8.9–10.3)
Chloride: 103 mmol/L (ref 98–111)
Creatinine, Ser: 1.88 mg/dL — ABNORMAL HIGH (ref 0.61–1.24)
GFR, Estimated: 35 mL/min — ABNORMAL LOW (ref >60)
Glucose, Bld: 127 mg/dL — ABNORMAL HIGH (ref 70–99)
Potassium: 4.9 mmol/L (ref 3.5–5.1)
Sodium: 137 mmol/L (ref 135–145)
Total Bilirubin: 0.7 mg/dL (ref 0.3–1.2)
Total Protein: 7.4 g/dL (ref 6.5–8.1)

## 2021-01-16 LAB — CBC WITH DIFFERENTIAL/PLATELET
Abs Immature Granulocytes: 0.02 10*3/uL (ref 0.00–0.07)
Basophils Absolute: 0.1 10*3/uL (ref 0.0–0.1)
Basophils Relative: 1 %
Eosinophils Absolute: 0.3 10*3/uL (ref 0.0–0.5)
Eosinophils Relative: 5 %
HCT: 36.9 % — ABNORMAL LOW (ref 39.0–52.0)
Hemoglobin: 11.9 g/dL — ABNORMAL LOW (ref 13.0–17.0)
Immature Granulocytes: 0 %
Lymphocytes Relative: 25 %
Lymphs Abs: 1.5 10*3/uL (ref 0.7–4.0)
MCH: 28.2 pg (ref 26.0–34.0)
MCHC: 32.2 g/dL (ref 30.0–36.0)
MCV: 87.4 fL (ref 80.0–100.0)
Monocytes Absolute: 0.7 10*3/uL (ref 0.1–1.0)
Monocytes Relative: 11 %
Neutro Abs: 3.6 10*3/uL (ref 1.7–7.7)
Neutrophils Relative %: 58 %
Platelets: 185 10*3/uL (ref 150–400)
RBC: 4.22 MIL/uL (ref 4.22–5.81)
RDW: 15.9 % — ABNORMAL HIGH (ref 11.5–15.5)
WBC: 6.1 10*3/uL (ref 4.0–10.5)
nRBC: 0 % (ref 0.0–0.2)

## 2021-01-17 ENCOUNTER — Other Ambulatory Visit (HOSPITAL_COMMUNITY): Payer: Medicare HMO

## 2021-01-17 ENCOUNTER — Ambulatory Visit (HOSPITAL_COMMUNITY)
Admission: RE | Admit: 2021-01-17 | Discharge: 2021-01-17 | Disposition: A | Discharge status: Home / Self Care | Payer: Medicare HMO | Source: Ambulatory Visit | Attending: Hematology | Admitting: Hematology

## 2021-01-17 ENCOUNTER — Other Ambulatory Visit: Payer: Self-pay

## 2021-01-17 DIAGNOSIS — C3492 Malignant neoplasm of unspecified part of left bronchus or lung: Secondary | ICD-10-CM | POA: Diagnosis present

## 2021-01-17 MED ORDER — IOHEXOL 300 MG/ML  SOLN
75.0000 mL | Freq: Once | INTRAMUSCULAR | Status: AC | PRN
  Administered 2021-01-17: 75 mL via INTRAVENOUS

## 2021-01-17 MED ORDER — IOHEXOL 350 MG/ML SOLN: 75.0000 mL | Freq: Once | INTRAVENOUS | Status: DC | PRN

## 2021-01-24 ENCOUNTER — Encounter (HOSPITAL_COMMUNITY): Payer: Self-pay | Admitting: Hematology

## 2021-01-24 ENCOUNTER — Inpatient Hospital Stay (HOSPITAL_BASED_OUTPATIENT_CLINIC_OR_DEPARTMENT_OTHER): Payer: Medicare HMO | Admitting: Hematology

## 2021-01-24 ENCOUNTER — Other Ambulatory Visit: Payer: Self-pay

## 2021-01-24 VITALS — BP 136/62 | HR 76 | Temp 97.0°F | Resp 18 | Wt 154.4 lb

## 2021-01-24 DIAGNOSIS — C3492 Malignant neoplasm of unspecified part of left bronchus or lung: Secondary | ICD-10-CM

## 2021-01-24 DIAGNOSIS — D472 Monoclonal gammopathy: Secondary | ICD-10-CM

## 2021-01-24 NOTE — Patient Instructions (Signed)
[place] at [facility] Discharge Instructions  You were seen today by Dr. Delton Coombes. He went over your recent results and scans. You will be scheduled to have a CT scan of your chest done before your next visit. Dr. Delton Coombes will see you back in 4 months for labs and follow up.   Thank you for choosing [place] at [facility] to provide your oncology and hematology care.  To afford each patient quality time with our provider, please arrive at least 15 minutes before your scheduled appointment time.   If you have a lab appointment with the [facility] please come in thru the Main Entrance and check in at the main information desk  You need to re-schedule your appointment should you arrive 10 or more minutes late.  We strive to give you quality time with our providers, and arriving late affects you and other patients whose appointments are after yours.  Also, if you no show three or more times for appointments you may be dismissed from the clinic at the providers discretion.     Again, thank you for choosing [facility].  Our hope is that these requests will decrease the amount of time that you wait before being seen by our physicians.       _____________________________________________________________  Should you have questions after your visit to [facility], please contact our office at [phone] between the hours of 8:00 a.m. and 4:30 p.m.  Voicemails left after 4:00 p.m. will not be returned until the following business day.  For prescription refill requests, have your pharmacy contact our office and allow 72 hours.    Cancer Center Support Programs:   > Cancer Support Group  2nd Tuesday of the month 1pm-2pm, Journey Room

## 2021-01-24 NOTE — Progress Notes (Signed)
William Wells, [place] [id number]   CLINIC:  Medical Oncology/Hematology  PCP:  William Squibb, MD [address] William [place] [address] [phone]   REASON FOR VISIT:  Follow-up for left lung cancer and MGUS  PRIOR THERAPY: Left upper lung lobectomy on 06/22/2020  NGS Results: Not done  CURRENT THERAPY: Surveillance  BRIEF ONCOLOGIC HISTORY:  Oncology History  Adenocarcinoma of left lung, stage 1 ([place])  06/26/2020 Initial Diagnosis   Adenocarcinoma of left lung, stage 1 ([place])   06/26/2020 Cancer Staging   Staging form: Lung, AJCC 8th Edition - Clinical stage from 06/26/2020: Stage IA3 (cT1c, cN0, cM0) - Signed by Melrose Nakayama, MD on 06/26/2020     CANCER STAGING: Cancer Staging Adenocarcinoma of left lung, stage 1 ([place]) Staging form: Lung, AJCC 8th Edition - Clinical stage from 06/26/2020: Stage IA3 (cT1c, cN0, cM0) - Signed by Melrose Nakayama, MD on 06/26/2020   INTERVAL HISTORY:  William Wells, a 87 y.o. male, returns for routine follow-up of his left lung cancer and MGUS. William Wells was last seen on 10/25/2020.   Today he is accompanied by his wife and he reports feeling well. He denies having any SOB, new cough, new joint or bone pain, recent infections, F/C or night sweats. He denies having leg swelling or abdominal pain.   REVIEW OF SYSTEMS:  Review of Systems  Constitutional: Positive for fatigue. Negative for appetite change, chills, diaphoresis, fever and unexpected weight change.  Respiratory: Negative for cough and shortness of breath.   Cardiovascular: Negative for leg swelling.  Gastrointestinal: Negative for abdominal pain.  Musculoskeletal: Negative for arthralgias.  All other systems reviewed and are negative.   PAST MEDICAL/SURGICAL HISTORY:  Past Medical History:  Diagnosis Date  . Acid reflux disease   . Arthritis   . Complication of anesthesia    appendix surgery (87 year old) - passed out  once patient got back to floor unit  . Diabetes mellitus   . Hiatal hernia   . High cholesterol   . Hypertension   . Hypothyroidism   . Monoclonal gammopathy of unknown significance (MGUS) 03/28/2020  . Normocytic anemia 03/28/2020  . Renal disorder    renal insufficiency  . Type 2 diabetes mellitus ([place])    Past Surgical History:  Procedure Laterality Date  . APPENDECTOMY    . BRONCHIAL BIOPSY  05/29/2020   Procedure: BRONCHIAL BIOPSIES;  Surgeon: Collene Gobble, MD;  Location: [facility] ENDOSCOPY;  Service: Pulmonary;;  . BRONCHIAL BRUSHINGS  05/29/2020   Procedure: BRONCHIAL BRUSHINGS;  Surgeon: Collene Gobble, MD;  Location: [facility] ENDOSCOPY;  Service: Pulmonary;;  . BRONCHIAL NEEDLE ASPIRATION BIOPSY  05/29/2020   Procedure: BRONCHIAL NEEDLE ASPIRATION BIOPSIES;  Surgeon: Collene Gobble, MD;  Location: [place];  Service: Pulmonary;;  . BRONCHIAL WASHINGS  05/29/2020   Procedure: BRONCHIAL WASHINGS;  Surgeon: Collene Gobble, MD;  Location: [facility] ENDOSCOPY;  Service: Pulmonary;;  . EYE SURGERY    . INTERCOSTAL NERVE BLOCK Left 06/22/2020   Procedure: INTERCOSTAL NERVE BLOCK;  Surgeon: Melrose Nakayama, MD;  Location: [place];  Service: Thoracic;  Laterality: Left;  . NODE DISSECTION Left 06/22/2020   Procedure: NODE DISSECTION;  Surgeon: Melrose Nakayama, MD;  Location: [place];  Service: Thoracic;  Laterality: Left;  . TONSILLECTOMY    . VIDEO BRONCHOSCOPY WITH ENDOBRONCHIAL NAVIGATION N/A 05/29/2020   Procedure: VIDEO BRONCHOSCOPY WITH ENDOBRONCHIAL NAVIGATION;  Surgeon: Collene Gobble, MD;  Location: [facility] ENDOSCOPY;  Service: Pulmonary;  Laterality: N/A;    SOCIAL HISTORY:  Social History   Socioeconomic History  . Marital status: Married    Spouse name: Not on file  . Number of children: 4  . Years of education: Not on file  . Highest education level: Not on file  Occupational History  . Occupation: RETIRED  Tobacco Use  . Smoking status: Former Smoker    Packs/day: 0.50     Years: 47.00    Pack years: 23.50    Types: Cigarettes    Quit date: 2000    Years since quitting: 22.3  . Smokeless tobacco: Never Used  Vaping Use  . Vaping Use: Never used  Substance and Sexual Activity  . Alcohol use: No  . Drug use: No  . Sexual activity: Not Currently  Other Topics Concern  . Not on file  Social History Narrative  . Not on file   Social Determinants of Health   Financial Resource Strain: Low Risk   . Difficulty of Paying Living Expenses: Not hard at all  Food Insecurity: No Food Insecurity  . Worried About Charity fundraiser in the Last Year: Never true  . Ran Out of Food in the Last Year: Never true  Transportation Needs: No Transportation Needs  . Lack of Transportation (Medical): No  . Lack of Transportation (Non-Medical): No  Physical Activity: Sufficiently Active  . Days of Exercise per Week: 7 days  . Minutes of Exercise per Session: 30 min  Stress: No Stress Concern Present  . Feeling of Stress : Only a little  Social Connections: Moderately Integrated  . Frequency of Communication with Friends and Family: More than three times a week  . Frequency of Social Gatherings with Friends and Family: More than three times a week  . Attends Religious Services: More than 4 times per year  . Active Member of Clubs or Organizations: No  . Attends Archivist Meetings: Not on file  . Marital Status: Married  Human resources officer Violence: Not At Risk  . Fear of Current or Ex-Partner: No  . Emotionally Abused: No  . Physically Abused: No  . Sexually Abused: No    FAMILY HISTORY:  Family History  Problem Relation Age of Onset  . Diabetes Mother   . Cancer Father   . Cancer Sister   . Diabetes Brother   . Cancer Brother   . Diabetes Brother   . Parkinson's disease Brother   . Diabetes Brother     CURRENT MEDICATIONS:  Current Outpatient Medications  Medication Sig Dispense Refill  . acetaminophen (TYLENOL) 500 MG tablet Take 2  tablets (1,000 mg total) by mouth every 6 (six) hours. 30 tablet 0  . amLODipine (NORVASC) 10 MG tablet Take 10 mg by mouth daily.     . Cholecalciferol 50 MCG (2000 UT) TABS Take 2,000 Units by mouth daily.     William Kitchen docusate sodium (COLACE) 100 MG capsule Take 1 capsule (100 mg total) by mouth 2 (two) times daily as needed for mild constipation. (Patient not taking: Reported on 10/25/2020) 10 capsule 0  . glipiZIDE (GLUCOTROL XL) 2.5 MG 24 hr tablet Take 2.5 mg by mouth daily as needed (blood sugar above 160).     William Kitchen HYDROcodone-acetaminophen (NORCO) 5-325 MG tablet Take 1 tablet by mouth daily as needed for moderate pain. 30 tablet 0  . Lancets (ONETOUCH DELICA PLUS [id number]) MISC 2 (two) times daily.    William Kitchen levothyroxine (SYNTHROID)  50 MCG tablet Take 50 mcg by mouth daily before breakfast.     . LOKELMA 5 g packet Take by mouth.    William Kitchen omeprazole (PRILOSEC) 20 MG capsule Take 20 mg by mouth daily.    Glory Rosebush ULTRA test strip 2 (two) times daily.    . rosuvastatin (CRESTOR) 40 MG tablet Take 40 mg by mouth daily.     . tamsulosin (FLOMAX) 0.4 MG CAPS capsule Take 1 capsule (0.4 mg total) by mouth daily. 30 capsule 0  . traMADol (ULTRAM) 50 MG tablet Take 1 tablet (50 mg total) by mouth every 6 (six) hours as needed (mild pain). 30 tablet 0   No current facility-administered medications for this visit.    ALLERGIES:  No Known Allergies  PHYSICAL EXAM:  Performance status (ECOG): 1 - Symptomatic but completely ambulatory  Vitals:   01/24/21 1418  BP: 136/62  Pulse: 76  Resp: 18  Temp: (!) 97 F (36.1 C)  SpO2: 100%   Wt Readings from Last 3 Encounters:  01/24/21 154 lb 6.4 oz (70 kg)  10/25/20 164 lb (74.4 kg)  09/11/20 158 lb (71.7 kg)   Physical Exam Vitals reviewed.  Constitutional:      Appearance: Normal appearance.  Cardiovascular:     Rate and Rhythm: Normal rate and regular rhythm.     Pulses: Normal pulses.     Heart sounds: Normal heart sounds.  Pulmonary:      Effort: Pulmonary effort is normal.     Breath sounds: Normal breath sounds.  Abdominal:     Palpations: Abdomen is soft. There is no mass.     Tenderness: There is no abdominal tenderness.  Musculoskeletal:     Right lower leg: No edema.     Left lower leg: No edema.  Neurological:     General: No focal deficit present.     Mental Status: He is alert and oriented to person, place, and time.  Psychiatric:        Mood and Affect: Mood normal.        Behavior: Behavior normal.      LABORATORY DATA:  I have reviewed the labs as listed.  CBC Latest Ref Rng & Units 01/16/2021 10/17/2020 06/24/2020  WBC 4.0 - 10.5 K/uL 6.1 8.2 10.5  Hemoglobin 13.0 - 17.0 g/dL 11.9(L) 12.9(L) 11.8(L)  Hematocrit 39.0 - 52.0 % 36.9(L) 39.6 35.9(L)  Platelets 150 - 400 K/uL 185 205 148(L)   CMP Latest Ref Rng & Units 01/16/2021 10/17/2020 06/25/2020  Glucose 70 - 99 mg/dL 127(H) 147(H) 153(H)  BUN 8 - 23 mg/dL 27(H) 28(H) 20  Creatinine 0.61 - 1.24 mg/dL 1.88(H) 1.88(H) 1.68(H)  Sodium 135 - 145 mmol/L 137 136 136  Potassium 3.5 - 5.1 mmol/L 4.9 4.7 4.1  Chloride 98 - 111 mmol/L 103 103 104  CO2 22 - 32 mmol/L 26 26 25   Calcium 8.9 - 10.3 mg/dL 10.0 10.3 9.4  Total Protein 6.5 - 8.1 g/dL 7.4 8.1 -  Total Bilirubin 0.3 - 1.2 mg/dL 0.7 0.7 -  Alkaline Phos 38 - 126 U/L 53 61 -  AST 15 - 41 U/L 18 23 -  ALT 0 - 44 U/L 26 29 -    DIAGNOSTIC IMAGING:  I have independently reviewed the scans and discussed with the patient. CT Chest W Contrast  Result Date: 01/19/2021 CLINICAL DATA:  Adenocarcinoma of left lung.  Surgery only. EXAM: CT CHEST WITH CONTRAST TECHNIQUE: Multidetector CT imaging of the chest was performed  during intravenous contrast administration. CONTRAST:  29mL OMNIPAQUE IOHEXOL 300 MG/ML  SOLN COMPARISON:  10/24/2020 FINDINGS: Cardiovascular: Advanced aortic and branch vessel atherosclerosis. Tortuous thoracic aorta. Normal heart size, without pericardial effusion. Lad coronary artery  calcification. No central pulmonary embolism, on this non-dedicated study. Mediastinum/Nodes: No supraclavicular adenopathy. No mediastinal or hilar adenopathy. Lungs/Pleura: Left-sided pleural effusion with anterior loculation, minimally decreased. Pleural thickening is likely related to chronicity. Status post left upper lobectomy.  Moderate bullous emphysema. Anterior left lung area of subpleural soft tissue thickening including at 7 mm on 83/4, new. 4 mm ground-glass nodule in the left lower lobe on 72/4, similar. Left apical 2 mm nodule on 31/4 is unchanged. Upper Abdomen: Normal imaged portions of the liver, spleen, pancreas, gallbladder, adrenal glands, kidneys. The proximal stomach appears thick walled, but is underdistended. Musculoskeletal: Lower thoracic spondylosis. IMPRESSION: 1. Status post left upper lobectomy, without recurrent or metastatic disease. 2. Decrease in loculated left-sided pleural effusion. Subtle soft tissue thickening along the subpleural anterior left lower lobe is favored to be related to atelectasis, but is new since the prior exam. Recommend attention on follow-up. 3. Aortic atherosclerosis (ICD10-I70.0), coronary artery atherosclerosis and emphysema (ICD10-J43.9). 4. Apparent proximal gastric wall thickening in the setting of underdistention. Correlate with symptoms of gastritis. Electronically Signed   By: Abigail Miyamoto M.D.   On: 01/19/2021 13:22     ASSESSMENT:  1.IgM kappa MGUS: -Serum immunofixation shows IgM kappa. -SPEP was negative. Free kappa light chains of 48.5, lambda light chains 24.8, ratio of 1.96. -Beta-2 microglobulin 3.2. LDH 105. -Creatinine 1.95 and calcium 9.7. -24-hour urine total protein was 572 mg. M spike was negative. -Skeletal survey on 03/29/2020 did not show any lytic lesions. -PET scan on 04/09/2020 did not show any skeletal abnormalities.  2.PT 1 CPN 0left lungadenocarcinoma: -He quit smoking in 1999, smoked about 46 pack  years. -PET scan on 04/09/2020 showed 2.1 x 2.1 cm left apical nodule with SUV 2.2. Possibility of low-grade adenocarcinoma. -Navigational bronchoscopy and biopsy of the left upper lobe lung lesion on 05/29/2020 consistent with adenocarcinoma. -Left upper lobectomy and lymph node biopsy on 06/22/2020. Pathology shows invasive adenocarcinoma, 3 cm, no visceral pleural invasion identified. Resection margins negative. 0/4 hilar lymph nodes negative for carcinoma. - CT chest with contrast on 10/24/2020 shows postoperative changes with moderate sized loculated appearing left pleural effusion with no enhancing pleural nodules.  Stable small scattered mediastinal and hilar lymph nodes.   PLAN:  1.IgM kappa MGUS: -Myeloma labs from 10/17/2020 showed free light chain ratio 1.97 with hemoglobin normal.  M spike is 0.4 g.  Creatinine 1.88 and calcium 10.3. - We will monitor myeloma labs at next visit in 4 months.  2. Left lungadenocarcinoma: -Reviewed CT scan of the chest from 01/17/2021 which showed no evidence of recurrence or metastatic disease.  Decreasing loculated left-sided pleural effusion.  Subtle soft tissue thickening along the subpleural anterior left lower lobe is favored to be atelectasis. - Recommend follow-up in 4 months with repeat CT scan.   Orders placed this encounter:  No orders of the defined types were placed in this encounter.    Derek Jack, MD [place] [phone]   I, Milinda Antis, am acting as a scribe for Dr. Sanda Linger.  I, Derek Jack MD, have reviewed the above documentation for accuracy and completeness, and I agree with the above.

## 2021-05-29 ENCOUNTER — Other Ambulatory Visit: Payer: Self-pay

## 2021-05-29 ENCOUNTER — Inpatient Hospital Stay (HOSPITAL_COMMUNITY): Payer: Medicare HMO | Attending: Hematology

## 2021-05-29 ENCOUNTER — Ambulatory Visit (HOSPITAL_COMMUNITY)
Admission: RE | Admit: 2021-05-29 | Discharge: 2021-05-29 | Disposition: A | Discharge status: Home / Self Care | Payer: Medicare HMO | Source: Ambulatory Visit | Attending: Hematology | Admitting: Hematology

## 2021-05-29 DIAGNOSIS — C3492 Malignant neoplasm of unspecified part of left bronchus or lung: Secondary | ICD-10-CM

## 2021-05-29 DIAGNOSIS — Z85118 Personal history of other malignant neoplasm of bronchus and lung: Secondary | ICD-10-CM | POA: Insufficient documentation

## 2021-05-29 DIAGNOSIS — Z87891 Personal history of nicotine dependence: Secondary | ICD-10-CM | POA: Insufficient documentation

## 2021-05-29 DIAGNOSIS — Z79899 Other long term (current) drug therapy: Secondary | ICD-10-CM | POA: Diagnosis not present

## 2021-05-29 DIAGNOSIS — D472 Monoclonal gammopathy: Secondary | ICD-10-CM | POA: Insufficient documentation

## 2021-05-29 LAB — COMPREHENSIVE METABOLIC PANEL
ALT: 20 U/L (ref 0–44)
AST: 17 U/L (ref 15–41)
Albumin: 3.9 g/dL (ref 3.5–5.0)
Alkaline Phosphatase: 48 U/L (ref 38–126)
Anion gap: 6 (ref 5–15)
BUN: 32 mg/dL — ABNORMAL HIGH (ref 8–23)
CO2: 26 mmol/L (ref 22–32)
Calcium: 9.4 mg/dL (ref 8.9–10.3)
Chloride: 102 mmol/L (ref 98–111)
Creatinine, Ser: 1.86 mg/dL — ABNORMAL HIGH (ref 0.61–1.24)
GFR, Estimated: 35 mL/min — ABNORMAL LOW (ref >60)
Glucose, Bld: 123 mg/dL — ABNORMAL HIGH (ref 70–99)
Potassium: 4.9 mmol/L (ref 3.5–5.1)
Sodium: 134 mmol/L — ABNORMAL LOW (ref 135–145)
Total Bilirubin: 0.4 mg/dL (ref 0.3–1.2)
Total Protein: 7.9 g/dL (ref 6.5–8.1)

## 2021-05-29 LAB — CBC WITH DIFFERENTIAL/PLATELET
Abs Immature Granulocytes: 0.02 10*3/uL (ref 0.00–0.07)
Basophils Absolute: 0.1 10*3/uL (ref 0.0–0.1)
Basophils Relative: 1 %
Eosinophils Absolute: 0.3 10*3/uL (ref 0.0–0.5)
Eosinophils Relative: 5 %
HCT: 38.3 % — ABNORMAL LOW (ref 39.0–52.0)
Hemoglobin: 12.5 g/dL — ABNORMAL LOW (ref 13.0–17.0)
Immature Granulocytes: 0 %
Lymphocytes Relative: 23 %
Lymphs Abs: 1.7 10*3/uL (ref 0.7–4.0)
MCH: 28.9 pg (ref 26.0–34.0)
MCHC: 32.6 g/dL (ref 30.0–36.0)
MCV: 88.7 fL (ref 80.0–100.0)
Monocytes Absolute: 0.7 10*3/uL (ref 0.1–1.0)
Monocytes Relative: 10 %
Neutro Abs: 4.4 10*3/uL (ref 1.7–7.7)
Neutrophils Relative %: 61 %
Platelets: 181 10*3/uL (ref 150–400)
RBC: 4.32 MIL/uL (ref 4.22–5.81)
RDW: 15.3 % (ref 11.5–15.5)
WBC: 7.2 10*3/uL (ref 4.0–10.5)
nRBC: 0 % (ref 0.0–0.2)

## 2021-05-29 MED ORDER — IOHEXOL 350 MG/ML SOLN
100.0000 mL | Freq: Once | INTRAVENOUS | Status: AC | PRN
  Administered 2021-05-29: 50 mL via INTRAVENOUS

## 2021-05-30 LAB — PROTEIN ELECTROPHORESIS, SERUM
A/G Ratio: 1.1 (ref 0.7–1.7)
Albumin ELP: 3.6 g/dL (ref 2.9–4.4)
Alpha-1-Globulin: 0.2 g/dL (ref 0.0–0.4)
Alpha-2-Globulin: 0.8 g/dL (ref 0.4–1.0)
Beta Globulin: 0.7 g/dL (ref 0.7–1.3)
Gamma Globulin: 1.4 g/dL (ref 0.4–1.8)
Globulin, Total: 3.2 g/dL (ref 2.2–3.9)
M-Spike, %: 0.5 g/dL — ABNORMAL HIGH
Total Protein ELP: 6.8 g/dL (ref 6.0–8.5)

## 2021-05-30 LAB — KAPPA/LAMBDA LIGHT CHAINS
Kappa free light chain: 67.4 mg/L — ABNORMAL HIGH (ref 3.3–19.4)
Kappa, lambda light chain ratio: 2.15 — ABNORMAL HIGH (ref 0.26–1.65)
Lambda free light chains: 31.4 mg/L — ABNORMAL HIGH (ref 5.7–26.3)

## 2021-06-02 NOTE — Progress Notes (Signed)
[place], [place] [address]   CLINIC:  Medical Oncology/Hematology  PCP:  William Squibb, MD [address] [place] [address] [phone]   REASON FOR VISIT:  Follow-up for left lung cancer and MGUS  PRIOR THERAPY: Left upper lung lobectomy on 06/22/2020  NGS Results: not done  CURRENT THERAPY: surveillance  BRIEF ONCOLOGIC HISTORY:  Oncology History  Adenocarcinoma of left lung, stage 1 ([place])  06/26/2020 Initial Diagnosis   Adenocarcinoma of left lung, stage 1 ([place])   06/26/2020 Cancer Staging   Staging form: Lung, AJCC 8th Edition - Clinical stage from 06/26/2020: Stage IA3 (cT1c, cN0, cM0) - Signed by Melrose Nakayama, MD on 06/26/2020     CANCER STAGING: Cancer Staging Adenocarcinoma of left lung, stage 1 ([place]) Staging form: Lung, AJCC 8th Edition - Clinical stage from 06/26/2020: Stage IA3 (cT1c, cN0, cM0) - Signed by Melrose Nakayama, MD on 06/26/2020   INTERVAL HISTORY:  [name], a 87 y.o. male, returns for routine follow-up of his left lung cancer and MGUS. William Wells was last seen on 01/24/21.   Today he reports feeling well. He denies fevers, night sweats, recent infections, SOB, CP, and weight loss.   REVIEW OF SYSTEMS:  Review of Systems  Constitutional:  Positive for fatigue (40%). Negative for appetite change (80%), fever and unexpected weight change.  Respiratory:  Negative for chest tightness and shortness of breath.   Neurological:  Positive for headaches.  All other systems reviewed and are negative.  PAST MEDICAL/SURGICAL HISTORY:  Past Medical History:  Diagnosis Date   Acid reflux disease    Arthritis    Complication of anesthesia    appendix surgery (87 year old) - passed out once patient got back to floor unit   Diabetes mellitus    Hiatal hernia    High cholesterol    Hypertension    Hypothyroidism    Monoclonal gammopathy of unknown significance (MGUS) 03/28/2020    Normocytic anemia 03/28/2020   Renal disorder    renal insufficiency   Type 2 diabetes mellitus ([facility])    Past Surgical History:  Procedure Laterality Date   APPENDECTOMY     BRONCHIAL BIOPSY  05/29/2020   Procedure: BRONCHIAL BIOPSIES;  Surgeon: Collene Gobble, MD;  Location: [facility] ENDOSCOPY;  Service: Pulmonary;;   BRONCHIAL BRUSHINGS  05/29/2020   Procedure: BRONCHIAL BRUSHINGS;  Surgeon: Collene Gobble, MD;  Location: [facility] ENDOSCOPY;  Service: Pulmonary;;   BRONCHIAL NEEDLE ASPIRATION BIOPSY  05/29/2020   Procedure: BRONCHIAL NEEDLE ASPIRATION BIOPSIES;  Surgeon: Collene Gobble, MD;  Location: [facility] ENDOSCOPY;  Service: Pulmonary;;   BRONCHIAL WASHINGS  05/29/2020   Procedure: BRONCHIAL WASHINGS;  Surgeon: Collene Gobble, MD;  Location: [facility] ENDOSCOPY;  Service: Pulmonary;;   EYE SURGERY     INTERCOSTAL NERVE BLOCK Left 06/22/2020   Procedure: INTERCOSTAL NERVE BLOCK;  Surgeon: Melrose Nakayama, MD;  Location: [facility];  Service: Thoracic;  Laterality: Left;   NODE DISSECTION Left 06/22/2020   Procedure: NODE DISSECTION;  Surgeon: Melrose Nakayama, MD;  Location: [facility];  Service: Thoracic;  Laterality: Left;   TONSILLECTOMY     VIDEO BRONCHOSCOPY WITH ENDOBRONCHIAL NAVIGATION N/A 05/29/2020   Procedure: VIDEO BRONCHOSCOPY WITH ENDOBRONCHIAL NAVIGATION;  Surgeon: Collene Gobble, MD;  Location: [facility] ENDOSCOPY;  Service: Pulmonary;  Laterality: N/A;    SOCIAL HISTORY:  Social History   Socioeconomic History   Marital status: Married  Spouse name: Not on file   Number of children: 4   Years of education: Not on file   Highest education level: Not on file  Occupational History   Occupation: RETIRED  Tobacco Use   Smoking status: Former    Packs/day: 0.50    Years: 47.00    Pack years: 23.50    Types: Cigarettes    Quit date: 2000    Years since quitting: 22.6   Smokeless tobacco: Never  Vaping Use   Vaping Use: Never used  Substance and Sexual Activity   Alcohol use: No    Drug use: No   Sexual activity: Not Currently  Other Topics Concern   Not on file  Social History Narrative   Not on file   Social Determinants of Health   Financial Resource Strain: Low Risk    Difficulty of Paying Living Expenses: Not hard at all  Food Insecurity: No Food Insecurity   Worried About Charity fundraiser in the Last Year: Never true   [place] in the Last Year: Never true  Transportation Needs: No Transportation Needs   Lack of Transportation (Medical): No   Lack of Transportation (Non-Medical): No  Physical Activity: Sufficiently Active   Days of Exercise per Week: 7 days   Minutes of Exercise per Session: 30 min  Stress: No Stress Concern Present   Feeling of Stress : Only a little  Social Connections: Moderately Integrated   Frequency of Communication with Friends and Family: More than three times a week   Frequency of Social Gatherings with Friends and Family: More than three times a week   Attends Religious Services: More than 4 times per year   Active Member of [employer] or Organizations: No   Attends Music therapist: Not on file   Marital Status: Married  Human resources officer Violence: Not At Risk   Fear of Current or Ex-Partner: No   Emotionally Abused: No   Physically Abused: No   Sexually Abused: No    FAMILY HISTORY:  Family History  Problem Relation Age of Onset   Diabetes Mother    Cancer Father    Cancer Sister    Diabetes Brother    Cancer Brother    Diabetes Brother    Parkinson's disease Brother    Diabetes Brother     CURRENT MEDICATIONS:  Current Outpatient Medications  Medication Sig Dispense Refill   acetaminophen (TYLENOL) 500 MG tablet Take 2 tablets (1,000 mg total) by mouth every 6 (six) hours. 30 tablet 0   amLODipine (NORVASC) 10 MG tablet Take 10 mg by mouth daily.      Cholecalciferol 50 MCG (2000 UT) TABS Take 2,000 Units by mouth daily.      docusate sodium (COLACE) 100 MG capsule Take 1 capsule (100 mg  total) by mouth 2 (two) times daily as needed for mild constipation. (Patient not taking: Reported on 10/25/2020) 10 capsule 0   glipiZIDE (GLUCOTROL XL) 2.5 MG 24 hr tablet Take 2.5 mg by mouth daily as needed (blood sugar above 160).      HYDROcodone-acetaminophen (NORCO) 5-325 MG tablet Take 1 tablet by mouth daily as needed for moderate pain. 30 tablet 0   Lancets (ONETOUCH DELICA PLUS [id number]) MISC 2 (two) times daily.     levothyroxine (SYNTHROID) 50 MCG tablet Take 50 mcg by mouth daily before breakfast.      LOKELMA 5 g packet Take by mouth.     omeprazole (PRILOSEC) 20 MG  capsule Take 20 mg by mouth daily.     ONETOUCH ULTRA test strip 2 (two) times daily.     rosuvastatin (CRESTOR) 40 MG tablet Take 40 mg by mouth daily.      tamsulosin (FLOMAX) 0.4 MG CAPS capsule Take 1 capsule (0.4 mg total) by mouth daily. 30 capsule 0   traMADol (ULTRAM) 50 MG tablet Take 1 tablet (50 mg total) by mouth every 6 (six) hours as needed (mild pain). 30 tablet 0   No current facility-administered medications for this visit.    ALLERGIES:  No Known Allergies  PHYSICAL EXAM:  Performance status (ECOG): 1 - Symptomatic but completely ambulatory  There were no vitals filed for this visit. Wt Readings from Last 3 Encounters:  01/24/21 154 lb 6.4 oz (70 kg)  10/25/20 164 lb (74.4 kg)  09/11/20 158 lb (71.7 kg)   Physical Exam Vitals reviewed.  Constitutional:      Appearance: Normal appearance.  Cardiovascular:     Rate and Rhythm: Normal rate and regular rhythm.     Pulses: Normal pulses.     Heart sounds: Normal heart sounds.  Pulmonary:     Effort: Pulmonary effort is normal.     Breath sounds: Normal breath sounds.  Neurological:     General: No focal deficit present.     Mental Status: He is alert and oriented to person, place, and time.  Psychiatric:        Mood and Affect: Mood normal.        Behavior: Behavior normal.     LABORATORY DATA:  I have reviewed the labs as  listed.  CBC Latest Ref Rng & Units 05/29/2021 01/16/2021 10/17/2020  WBC 4.0 - 10.5 K/uL 7.2 6.1 8.2  Hemoglobin 13.0 - 17.0 g/dL 12.5(L) 11.9(L) 12.9(L)  Hematocrit 39.0 - 52.0 % 38.3(L) 36.9(L) 39.6  Platelets 150 - 400 K/uL 181 185 205   CMP Latest Ref Rng & Units 05/29/2021 01/16/2021 10/17/2020  Glucose 70 - 99 mg/dL 123(H) 127(H) 147(H)  BUN 8 - 23 mg/dL 32(H) 27(H) 28(H)  Creatinine 0.61 - 1.24 mg/dL 1.86(H) 1.88(H) 1.88(H)  Sodium 135 - 145 mmol/L 134(L) 137 136  Potassium 3.5 - 5.1 mmol/L 4.9 4.9 4.7  Chloride 98 - 111 mmol/L 102 103 103  CO2 22 - 32 mmol/L 26 26 26   Calcium 8.9 - 10.3 mg/dL 9.4 10.0 10.3  Total Protein 6.5 - 8.1 g/dL 7.9 7.4 8.1  Total Bilirubin 0.3 - 1.2 mg/dL 0.4 0.7 0.7  Alkaline Phos 38 - 126 U/L 48 53 61  AST 15 - 41 U/L 17 18 23   ALT 0 - 44 U/L 20 26 29     DIAGNOSTIC IMAGING:  I have independently reviewed the scans and discussed with the patient. CT Chest W Contrast  Result Date: 05/30/2021 CLINICAL DATA:  Lung cancer. EXAM: CT CHEST WITH CONTRAST TECHNIQUE: Multidetector CT imaging of the chest was performed during intravenous contrast administration. CONTRAST:  85mL OMNIPAQUE IOHEXOL 350 MG/ML SOLN COMPARISON:  01/17/2021. FINDINGS: Cardiovascular: Atherosclerotic calcification of the aorta, aortic valve and coronary arteries. Heart size normal. No pericardial effusion. Mediastinum/Nodes: No pathologically enlarged mediastinal or hilar lymph nodes. Right axillary lymph nodes measure up to 9 mm (2/41), increased from 6 mm on 01/17/2021. No left axillary adenopathy. There may be distal esophageal wall thickening which can be seen with gastroesophageal reflux. Lungs/Pleura: Centrilobular emphysema. Left upper lobectomy. Pleuroparenchymal scarring at the base of the left hemithorax. Lungs are otherwise clear. Focal nodularity along the  anterior pleural surface in the mid left hemithorax (4/75), unchanged. Small loculated left pleural fluid with pleural  thickening, unchanged. Airway is otherwise unremarkable. Upper Abdomen: Visualized portions of the liver, gallbladder and right adrenal gland are unremarkable. Slight nodular thickening of the left adrenal gland. Slight cortical thinning/scarring in the kidneys bilaterally. Visualized portions of the kidneys, spleen and pancreas are otherwise unremarkable. Proximal gastric wall thickening, unchanged. Visualized portions of the stomach and bowel are otherwise unremarkable. No upper abdominal adenopathy. Musculoskeletal: Degenerative changes in the spine. No worrisome lytic or sclerotic lesions. IMPRESSION: 1. Left upper lobectomy with a small left fibrothorax, as before. No evidence of recurrent or metastatic disease. 2. Right axillary lymph nodes are not enlarged by CT size criteria but appear increased in size slightly from 01/17/2021. Attention on follow-up. 3. Persistent gastric wall thickening. 4. Aortic atherosclerosis (ICD10-I70.0). Coronary artery calcification. 5.  Emphysema (ICD10-J43.9). Electronically Signed   By: Lorin Picket M.D.   On: 05/30/2021 09:31     ASSESSMENT:  1.  IgM kappa MGUS: -Serum immunofixation shows IgM kappa. -SPEP was negative.  Free kappa light chains of 48.5, lambda light chains 24.8, ratio of 1.96. -Beta-2 microglobulin 3.2.  LDH 105. -Creatinine 1.95 and calcium 9.7. -24-hour urine total protein was 572 mg.  M spike was negative. -Skeletal survey on 03/29/2020 did not show any lytic lesions. -PET scan on 04/09/2020 did not show any skeletal abnormalities.   2.  PT 1 CPN 0 left lung adenocarcinoma: -He quit smoking in 1999, smoked about 46 pack years. -PET scan on 04/09/2020 showed 2.1 x 2.1 cm left apical nodule with SUV 2.2.  Possibility of low-grade adenocarcinoma. -Navigational bronchoscopy and biopsy of the left upper lobe lung lesion on 05/29/2020 consistent with adenocarcinoma. -Left upper lobectomy and lymph node biopsy on 06/22/2020.  Pathology shows invasive  adenocarcinoma, 3 cm, no visceral pleural invasion identified.  Resection margins negative.  0/4 hilar lymph nodes negative for carcinoma. - CT chest with contrast on 10/24/2020 shows postoperative changes with moderate sized loculated appearing left pleural effusion with no enhancing pleural nodules.  Stable small scattered mediastinal and hilar lymph nodes.   PLAN:  1.  IgM kappa MGUS: - Denies any new onset bone pains.  Denies any B symptoms. - Reviewed labs from 05/29/2021.  Creatinine is stable at 1.6 and calcium 9.4.  M spike is stable at 0.5 g.  Free light chain ratio is 2.15 and kappa light chain 67. - Recommend follow-up in 6 months with repeat myeloma panel.   2.  Left lung adenocarcinoma: - No clinical signs of recurrence at this time. - Reviewed CT from 05/29/2021 which did not show any evidence of recurrence or metastatic disease.  Right axillary lymph nodes are not enlarged by CT criteria but up to her increased in size slightly from 01/17/2021.  Persistent gastric wall thickening.  No symptoms. - Recommend CT chest in 6 months.   Orders placed this encounter:  No orders of the defined types were placed in this encounter.    Derek Jack, MD [place] [phone]   I, Thana Ates, am acting as a scribe for Dr. Derek Jack.  I, Derek Jack MD, have reviewed the above documentation for accuracy and completeness, and I agree with the above.

## 2021-06-03 ENCOUNTER — Inpatient Hospital Stay (HOSPITAL_BASED_OUTPATIENT_CLINIC_OR_DEPARTMENT_OTHER): Payer: Medicare HMO | Admitting: Hematology

## 2021-06-03 ENCOUNTER — Other Ambulatory Visit: Payer: Self-pay

## 2021-06-03 VITALS — BP 117/66 | HR 82 | Temp 97.0°F | Resp 18

## 2021-06-03 DIAGNOSIS — D472 Monoclonal gammopathy: Secondary | ICD-10-CM

## 2021-06-03 DIAGNOSIS — C3492 Malignant neoplasm of unspecified part of left bronchus or lung: Secondary | ICD-10-CM

## 2021-06-03 NOTE — Patient Instructions (Addendum)
[place] at [facility] Discharge Instructions  You were seen today by Dr. Delton Coombes. He went over your recent results and scans. You will be scheduled for a CT scan of your chest prior to your next visit. Dr. Delton Coombes will see you back in 6 months for labs and follow up.   Thank you for choosing [place] at [facility] to provide your oncology and hematology care.  To afford each patient quality time with our provider, please arrive at least 15 minutes before your scheduled appointment time.   If you have a lab appointment with the [facility] please come in thru the Main Entrance and check in at the main information desk  You need to re-schedule your appointment should you arrive 10 or more minutes late.  We strive to give you quality time with our providers, and arriving late affects you and other patients whose appointments are after yours.  Also, if you no show three or more times for appointments you may be dismissed from the clinic at the providers discretion.     Again, thank you for choosing [facility].  Our hope is that these requests will decrease the amount of time that you wait before being seen by our physicians.       _____________________________________________________________  Should you have questions after your visit to [facility], please contact our office at [phone] between the hours of 8:00 a.m. and 4:30 p.m.  Voicemails left after 4:00 p.m. will not be returned until the following business day.  For prescription refill requests, have your pharmacy contact our office and allow 72 hours.    Cancer Center Support Programs:   > Cancer Support Group  2nd Tuesday of the month 1pm-2pm, Journey Room

## 2021-06-17 ENCOUNTER — Other Ambulatory Visit: Payer: Self-pay | Admitting: Thoracic Surgery (Cardiothoracic Vascular Surgery)

## 2021-06-17 DIAGNOSIS — C3492 Malignant neoplasm of unspecified part of left bronchus or lung: Secondary | ICD-10-CM

## 2021-06-18 ENCOUNTER — Other Ambulatory Visit: Payer: Self-pay

## 2021-06-18 ENCOUNTER — Ambulatory Visit
Admission: RE | Admit: 2021-06-18 | Discharge: 2021-06-18 | Disposition: A | Discharge status: Home / Self Care | Payer: Medicare HMO | Source: Ambulatory Visit | Attending: Thoracic Surgery (Cardiothoracic Vascular Surgery) | Admitting: Thoracic Surgery (Cardiothoracic Vascular Surgery)

## 2021-06-18 ENCOUNTER — Encounter: Payer: Self-pay | Admitting: Thoracic Surgery (Cardiothoracic Vascular Surgery)

## 2021-06-18 ENCOUNTER — Ambulatory Visit: Payer: Medicare HMO | Admitting: Thoracic Surgery (Cardiothoracic Vascular Surgery)

## 2021-06-18 VITALS — BP 119/59 | HR 83 | Resp 20 | Ht 68.0 in | Wt 151.0 lb

## 2021-06-18 DIAGNOSIS — C3492 Malignant neoplasm of unspecified part of left bronchus or lung: Secondary | ICD-10-CM | POA: Diagnosis not present

## 2021-06-18 NOTE — Progress Notes (Signed)
William Wells       [place] [address]             [phone]       HPI: [name] returns for a 1 year follow-up after a left upper lobectomy.  William Wells is an 87 year old man with a past history of monoclonal gammopathy, lung cancer, hypertension, hyperlipidemia, hiatal hernia, acid reflux, type 2 diabetes, stage IV chronic kidney disease, and hypothyroidism.  In 2021 he was found to have a lung nodule.  The nodule was hypermetabolic on PET/CT.  Navigational bronchoscopy showed adenocarcinoma.  I did a robotic left upper lobectomy on 06/22/2020.  The nodule turned out to be a T1c, N0, stage Ia adenocarcinoma.  He did not require adjuvant therapy.  He is currently under observation.  He recently saw Dr. Delton Coombes.  His CT showed no evidence of recurrent disease.  He complains of a lack of energy.  He does not have any major shortness of breath issues.  He does get tired with activity.  He is lost about 10 pounds over the past year.  He attributes that to a restrictive diet he is on for his kidneys.  Past Medical History:  Diagnosis Date   Acid reflux disease    Arthritis    Complication of anesthesia    appendix surgery (87 year old) - passed out once patient got back to floor unit   Diabetes mellitus    Hiatal hernia    High cholesterol    Hypertension    Hypothyroidism    Monoclonal gammopathy of unknown significance (MGUS) 03/28/2020   Normocytic anemia 03/28/2020   Renal disorder    renal insufficiency   Type 2 diabetes mellitus ([facility])     Current Outpatient Medications  Medication Sig Dispense Refill   acetaminophen (TYLENOL) 500 MG tablet Take 2 tablets (1,000 mg total) by mouth every 6 (six) hours. 30 tablet 0   Cholecalciferol 50 MCG (2000 UT) TABS Take 2,000 Units by mouth daily.      docusate sodium (COLACE) 100 MG capsule Take 1 capsule (100 mg total) by mouth 2 (two) times daily as needed for mild constipation. 10 capsule 0   glipiZIDE  (GLUCOTROL XL) 2.5 MG 24 hr tablet Take 2.5 mg by mouth daily as needed (blood sugar above 160).      HYDROcodone-acetaminophen (NORCO) 5-325 MG tablet Take 1 tablet by mouth daily as needed for moderate pain. 30 tablet 0   Lancets (ONETOUCH DELICA PLUS [id number]) MISC 2 (two) times daily.     levothyroxine (SYNTHROID) 75 MCG tablet Take 75 mcg by mouth daily.     LOKELMA 5 g packet Take by mouth.     omeprazole (PRILOSEC) 20 MG capsule Take 20 mg by mouth daily.     ONETOUCH ULTRA test strip 2 (two) times daily.     rosuvastatin (CRESTOR) 40 MG tablet Take 40 mg by mouth daily.      traMADol (ULTRAM) 50 MG tablet Take 1 tablet (50 mg total) by mouth every 6 (six) hours as needed (mild pain). 30 tablet 0   amLODipine (NORVASC) 10 MG tablet Take 10 mg by mouth daily.      No current facility-administered medications for this visit.    Physical Exam BP (!) 119/59 (BP Location: Left Arm, Patient Position: Sitting)   Pulse 83   Resp 20   Ht 5\' 8"  (1.727 m)   Wt 151 lb (68.5 kg) Comment: fully clothed  SpO2 97% Comment: RA  BMI 22.76 kg/m  87 year old man in no acute distress Alert and oriented x3 with no focal deficits No cervical or supraclavicular adenopathy Cardiac regular rate and rhythm Lungs diminished at left base but otherwise clear Incisions well-healed  Diagnostic Tests: CT CHEST WITH CONTRAST   TECHNIQUE: Multidetector CT imaging of the chest was performed during intravenous contrast administration.   CONTRAST:  25mL OMNIPAQUE IOHEXOL 350 MG/ML SOLN   COMPARISON:  01/17/2021.   FINDINGS: Cardiovascular: Atherosclerotic calcification of the aorta, aortic valve and coronary arteries. Heart size normal. No pericardial effusion.   Mediastinum/Nodes: No pathologically enlarged mediastinal or hilar lymph nodes. Right axillary lymph nodes measure up to 9 mm (2/41), increased from 6 mm on 01/17/2021. No left axillary adenopathy. There may be distal esophageal wall  thickening which can be seen with gastroesophageal reflux.   Lungs/Pleura: Centrilobular emphysema. Left upper lobectomy. Pleuroparenchymal scarring at the base of the left hemithorax. Lungs are otherwise clear. Focal nodularity along the anterior pleural surface in the mid left hemithorax (4/75), unchanged. Small loculated left pleural fluid with pleural thickening, unchanged. Airway is otherwise unremarkable.   Upper Abdomen: Visualized portions of the liver, gallbladder and right adrenal gland are unremarkable. Slight nodular thickening of the left adrenal gland. Slight cortical thinning/scarring in the kidneys bilaterally. Visualized portions of the kidneys, spleen and pancreas are otherwise unremarkable. Proximal gastric wall thickening, unchanged. Visualized portions of the stomach and bowel are otherwise unremarkable. No upper abdominal adenopathy.   Musculoskeletal: Degenerative changes in the spine. No worrisome lytic or sclerotic lesions.   IMPRESSION: 1. Left upper lobectomy with a small left fibrothorax, as before. No evidence of recurrent or metastatic disease. 2. Right axillary lymph nodes are not enlarged by CT size criteria but appear increased in size slightly from 01/17/2021. Attention on follow-up. 3. Persistent gastric wall thickening. 4. Aortic atherosclerosis (ICD10-I70.0). Coronary artery calcification. 5.  Emphysema (ICD10-J43.9).     Electronically Signed   By: Lorin Picket M.D.   On: 05/30/2021 09:31 I personally reviewed the CT images and concur with the findings noted above.  Also reviewed the chest x-ray from today which shows similar postoperative findings with no evidence of recurrent disease.  Impression: William Wells is an 87 year old man with a past history of monoclonal gammopathy, lung cancer, hypertension, hyperlipidemia, hiatal hernia, acid reflux, type 2 diabetes, stage IV chronic kidney disease, and hypothyroidism.  Stage Ia  adenocarcinoma of the lung-now about a year out from surgery.  No pain related to surgery.  No evidence of recurrent disease.  Does have some physical limitations.  Aortic and coronary atherosclerosis-no chest pain.  He is on Crestor.  Tobacco abuse-quit smoking in 2000.  Plan: Continue follow-up with Dr. Delton Coombes I will be happy to see Mr. Anacker back anytime in the future if I can be of any further assistance with his care  Melrose Nakayama, MD [facility] [phone]

## 2021-09-08 ENCOUNTER — Other Ambulatory Visit: Payer: Self-pay

## 2021-09-08 ENCOUNTER — Emergency Department (HOSPITAL_COMMUNITY): Payer: Medicare HMO

## 2021-09-08 ENCOUNTER — Emergency Department (HOSPITAL_COMMUNITY)
Admission: EM | Admit: 2021-09-08 | Discharge: 2021-09-08 | Disposition: A | Discharge status: Home / Self Care | Payer: Medicare HMO | Attending: Emergency Medicine | Admitting: Emergency Medicine

## 2021-09-08 ENCOUNTER — Encounter (HOSPITAL_COMMUNITY): Payer: Self-pay

## 2021-09-08 DIAGNOSIS — I129 Hypertensive chronic kidney disease with stage 1 through stage 4 chronic kidney disease, or unspecified chronic kidney disease: Secondary | ICD-10-CM | POA: Insufficient documentation

## 2021-09-08 DIAGNOSIS — Z7984 Long term (current) use of oral hypoglycemic drugs: Secondary | ICD-10-CM | POA: Diagnosis not present

## 2021-09-08 DIAGNOSIS — M542 Cervicalgia: Secondary | ICD-10-CM | POA: Diagnosis present

## 2021-09-08 DIAGNOSIS — N182 Chronic kidney disease, stage 2 (mild): Secondary | ICD-10-CM | POA: Insufficient documentation

## 2021-09-08 DIAGNOSIS — Z79899 Other long term (current) drug therapy: Secondary | ICD-10-CM | POA: Diagnosis not present

## 2021-09-08 DIAGNOSIS — Z85118 Personal history of other malignant neoplasm of bronchus and lung: Secondary | ICD-10-CM | POA: Diagnosis not present

## 2021-09-08 DIAGNOSIS — R202 Paresthesia of skin: Secondary | ICD-10-CM | POA: Insufficient documentation

## 2021-09-08 DIAGNOSIS — E039 Hypothyroidism, unspecified: Secondary | ICD-10-CM | POA: Diagnosis not present

## 2021-09-08 DIAGNOSIS — W1789XA Other fall from one level to another, initial encounter: Secondary | ICD-10-CM | POA: Insufficient documentation

## 2021-09-08 DIAGNOSIS — M546 Pain in thoracic spine: Secondary | ICD-10-CM | POA: Diagnosis not present

## 2021-09-08 DIAGNOSIS — E1122 Type 2 diabetes mellitus with diabetic chronic kidney disease: Secondary | ICD-10-CM | POA: Diagnosis not present

## 2021-09-08 DIAGNOSIS — G9589 Other specified diseases of spinal cord: Secondary | ICD-10-CM

## 2021-09-08 DIAGNOSIS — M5001 Cervical disc disorder with myelopathy,  high cervical region: Secondary | ICD-10-CM | POA: Diagnosis not present

## 2021-09-08 DIAGNOSIS — Z87891 Personal history of nicotine dependence: Secondary | ICD-10-CM | POA: Insufficient documentation

## 2021-09-08 DIAGNOSIS — W19XXXD Unspecified fall, subsequent encounter: Secondary | ICD-10-CM

## 2021-09-08 LAB — BASIC METABOLIC PANEL
Anion gap: 7 (ref 5–15)
BUN: 30 mg/dL — ABNORMAL HIGH (ref 8–23)
CO2: 24 mmol/L (ref 22–32)
Calcium: 9.6 mg/dL (ref 8.9–10.3)
Chloride: 104 mmol/L (ref 98–111)
Creatinine, Ser: 1.73 mg/dL — ABNORMAL HIGH (ref 0.61–1.24)
GFR, Estimated: 38 mL/min — ABNORMAL LOW (ref >60)
Glucose, Bld: 169 mg/dL — ABNORMAL HIGH (ref 70–99)
Potassium: 3.9 mmol/L (ref 3.5–5.1)
Sodium: 135 mmol/L (ref 135–145)

## 2021-09-08 LAB — CBC WITH DIFFERENTIAL/PLATELET
Abs Immature Granulocytes: 0.04 10*3/uL (ref 0.00–0.07)
Basophils Absolute: 0 10*3/uL (ref 0.0–0.1)
Basophils Relative: 0 %
Eosinophils Absolute: 0.1 10*3/uL (ref 0.0–0.5)
Eosinophils Relative: 1 %
HCT: 37.5 % — ABNORMAL LOW (ref 39.0–52.0)
Hemoglobin: 12.3 g/dL — ABNORMAL LOW (ref 13.0–17.0)
Immature Granulocytes: 0 %
Lymphocytes Relative: 9 %
Lymphs Abs: 0.9 10*3/uL (ref 0.7–4.0)
MCH: 29 pg (ref 26.0–34.0)
MCHC: 32.8 g/dL (ref 30.0–36.0)
MCV: 88.4 fL (ref 80.0–100.0)
Monocytes Absolute: 1.3 10*3/uL — ABNORMAL HIGH (ref 0.1–1.0)
Monocytes Relative: 12 %
Neutro Abs: 7.9 10*3/uL — ABNORMAL HIGH (ref 1.7–7.7)
Neutrophils Relative %: 78 %
Platelets: 139 10*3/uL — ABNORMAL LOW (ref 150–400)
RBC: 4.24 MIL/uL (ref 4.22–5.81)
RDW: 15 % (ref 11.5–15.5)
WBC: 10.2 10*3/uL (ref 4.0–10.5)
nRBC: 0 % (ref 0.0–0.2)

## 2021-09-08 MED ORDER — MORPHINE SULFATE (PF) 4 MG/ML IV SOLN
4.0000 mg | Freq: Once | INTRAVENOUS | Status: AC
  Administered 2021-09-08: 17:00:00 4 mg via INTRAVENOUS
  Filled 2021-09-08: qty 1

## 2021-09-08 MED ORDER — OXYCODONE-ACETAMINOPHEN 5-325 MG PO TABS: 1.0000 | ORAL_TABLET | Freq: Four times a day (QID) | ORAL | 0 refills | Status: DC | PRN

## 2021-09-08 MED ORDER — METHYLPREDNISOLONE 4 MG PO TBPK: ORAL_TABLET | ORAL | 0 refills | Status: DC

## 2021-09-08 MED ORDER — ONDANSETRON HCL 4 MG/2ML IJ SOLN
4.0000 mg | Freq: Once | INTRAMUSCULAR | Status: AC
  Administered 2021-09-08: 17:00:00 4 mg via INTRAVENOUS
  Filled 2021-09-08: qty 2

## 2021-09-08 MED ORDER — METHOCARBAMOL 500 MG PO TABS
500.0000 mg | ORAL_TABLET | Freq: Once | ORAL | Status: AC
  Administered 2021-09-08: 17:00:00 500 mg via ORAL
  Filled 2021-09-08: qty 1

## 2021-09-08 MED ORDER — OXYCODONE-ACETAMINOPHEN 5-325 MG PO TABS
1.0000 | ORAL_TABLET | Freq: Once | ORAL | Status: AC
  Administered 2021-09-08: 21:00:00 1 via ORAL
  Filled 2021-09-08: qty 1

## 2021-09-08 MED ORDER — PREDNISONE 20 MG PO TABS
60.0000 mg | ORAL_TABLET | Freq: Once | ORAL | Status: AC
  Administered 2021-09-08: 21:00:00 60 mg via ORAL
  Filled 2021-09-08: qty 3

## 2021-09-08 NOTE — Discharge Instructions (Addendum)
Your MRIs today show no evidence of fracture or ligamentous injury.  You do have chronic degenerative changes of your cervical and thoracic spine and 2 areas of myelomalacia, likely from your prior surgery.  To help better treat pain you may use oxycodone as prescribed, use caution when taking this medication as it can cause drowsiness.  You can also use the Robaxin that was prescribed to you at [facility] and please begin taking Medrol Dosepak tomorrow morning as directed.  Please call tomorrow to schedule close follow-up appointment with Dr. Vertell Limber with neurosurgery.

## 2021-09-08 NOTE — ED Triage Notes (Signed)
[facility] reports pt coming from home. Pt had a fall on Friday and was admitted at [facility]. Sent pt home with meds and a c-collar in place. Pt called EMS today because he is still in pain.

## 2021-09-08 NOTE — ED Notes (Addendum)
Pt ambulated with walker, PA notified.

## 2021-09-08 NOTE — ED Provider Notes (Signed)
[facility] EMERGENCY DEPARTMENT Provider Note   CSN: [id number] Arrival date & time: 09/08/21  1527     History Chief Complaint  Patient presents with   William Wells is a 87 y.o. male.  William Wells is a 87 y.o. male with a history of hypertension, hyperlipidemia, diabetes, acid reflux, prior cervical laminectomy, who presents to the emergency department for evaluation of worsening pain in his neck and upper back after a fall.  2 days ago patient fell backwards off of a porch about 5 feet landing headfirst.  He was taken by EMS to [facility] immediately after the fall and per the patient and daughter had CT scans from his head to pelvis that showed no acute traumatic injury.  He was placed in an Aspen cervical collar to help with his neck pain and discharged home with a muscle relaxer and told to follow-up with his primary care provider.  Patient and daughter report he has had significantly worsening pain and very limited mobility since getting home from the hospital.  They report that with any movement he has severe pain and he now has worsening pain in his neck and upper back that is radiating down the right arm.  He also reports that he has had some numbness and tingling in the right arm.  No numbness, tingling or weakness in his lower extremities.  At baseline he typically walks independently, but is now had significant difficulty getting up due to pain even with a walker.  Daughter reports that he was prescribed Robaxin but she read on the packaging that this could be dangerous with his chronic kidney disease so did not use this, use Flexeril that they had at home without much improvement.  He denies headache, no chest pain, shortness of breath or abdominal pain.  No pain in his lower extremities.  Does report a lot of pain in the right shoulder.  Reports remote history of neck surgery with Dr. Vertell Limber.  No other aggravating or alleviating factors.  The  history is provided by the patient, medical records and a relative.      Past Medical History:  Diagnosis Date   Acid reflux disease    Arthritis    Complication of anesthesia    appendix surgery (87 year old) - passed out once patient got back to floor unit   Diabetes mellitus    Hiatal hernia    High cholesterol    Hypertension    Hypothyroidism    Monoclonal gammopathy of unknown significance (MGUS) 03/28/2020   Normocytic anemia 03/28/2020   Renal disorder    renal insufficiency   Type 2 diabetes mellitus [facility])     Patient Active Problem List   Diagnosis Date Noted   Adenocarcinoma of left lung, stage 1 ([place]) 06/26/2020   Status post robot-assisted surgical procedure 06/22/2020   S/P robot-assisted surgical procedure 06/22/2020   Mass of upper lobe of left lung 04/11/2020   Monoclonal gammopathy of unknown significance (MGUS) 03/28/2020   Normocytic anemia 03/28/2020   Hyperkalemia 07/15/2019   Chronic kidney disease due to type 2 diabetes mellitus ([place]) 07/15/2019   Benign hypertension with chronic kidney disease 07/15/2019   Sciatica, left side 02/17/2018    Past Surgical History:  Procedure Laterality Date   APPENDECTOMY     BRONCHIAL BIOPSY  05/29/2020   Procedure: BRONCHIAL BIOPSIES;  Surgeon: Collene Gobble, MD;  Location: [facility] ENDOSCOPY;  Service: Pulmonary;;   BRONCHIAL BRUSHINGS  05/29/2020   Procedure: BRONCHIAL BRUSHINGS;  Surgeon: Collene Gobble, MD;  Location: [facility] ENDOSCOPY;  Service: Pulmonary;;   BRONCHIAL NEEDLE ASPIRATION BIOPSY  05/29/2020   Procedure: BRONCHIAL NEEDLE ASPIRATION BIOPSIES;  Surgeon: Collene Gobble, MD;  Location: [facility] ENDOSCOPY;  Service: Pulmonary;;   BRONCHIAL WASHINGS  05/29/2020   Procedure: BRONCHIAL WASHINGS;  Surgeon: Collene Gobble, MD;  Location: [facility] ENDOSCOPY;  Service: Pulmonary;;   EYE SURGERY     INTERCOSTAL NERVE BLOCK Left 06/22/2020   Procedure: INTERCOSTAL NERVE BLOCK;  Surgeon: Melrose Nakayama, MD;  Location: [place];  Service: Thoracic;  Laterality: Left;   NODE DISSECTION Left 06/22/2020   Procedure: NODE DISSECTION;  Surgeon: Melrose Nakayama, MD;  Location: [place];  Service: Thoracic;  Laterality: Left;   TONSILLECTOMY     VIDEO BRONCHOSCOPY WITH ENDOBRONCHIAL NAVIGATION N/A 05/29/2020   Procedure: VIDEO BRONCHOSCOPY WITH ENDOBRONCHIAL NAVIGATION;  Surgeon: Collene Gobble, MD;  Location: [facility] ENDOSCOPY;  Service: Pulmonary;  Laterality: N/A;       Family History  Problem Relation Age of Onset   Diabetes Mother    Cancer Father    Cancer Sister    Diabetes Brother    Cancer Brother    Diabetes Brother    Parkinson's disease Brother    Diabetes Brother     Social History   Tobacco Use   Smoking status: Former    Packs/day: 0.50    Years: 47.00    Pack years: 23.50    Types: Cigarettes    Quit date: 2000    Years since quitting: 22.9   Smokeless tobacco: Never  Vaping Use   Vaping Use: Never used  Substance Use Topics   Alcohol use: No   Drug use: No    Home Medications Prior to Admission medications   Medication Sig Start Date End Date Taking? Authorizing Provider  methylPREDNISolone (MEDROL DOSEPAK) 4 MG TBPK tablet Take as directed 09/08/21  Yes Jacqlyn Larsen, PA-C  oxyCODONE-acetaminophen (PERCOCET) 5-325 MG tablet Take 1 tablet by mouth every 6 (six) hours as needed. 09/08/21  Yes Jacqlyn Larsen, PA-C  acetaminophen (TYLENOL) 500 MG tablet Take 2 tablets (1,000 mg total) by mouth every 6 (six) hours. 06/26/20   Elgie Collard, PA-C  amLODipine (NORVASC) 10 MG tablet Take 10 mg by mouth daily.  07/28/19 09/11/20  [provider]  Cholecalciferol 50 MCG (2000 UT) TABS Take 2,000 Units by mouth daily.     [provider]  docusate sodium (COLACE) 100 MG capsule Take 1 capsule (100 mg total) by mouth 2 (two) times daily as needed for mild constipation. 06/26/20   Elgie Collard, PA-C  glipiZIDE (GLUCOTROL XL) 2.5 MG 24 hr tablet Take 2.5 mg by mouth daily as  needed (blood sugar above 160).  03/20/20   [provider]  Lancets (ONETOUCH DELICA PLUS [id number]) [place] 2 (two) times daily. 03/27/20   [provider]  levothyroxine (SYNTHROID) 75 MCG tablet Take 75 mcg by mouth daily. 05/23/21   [provider]  LOKELMA 5 g packet Take by mouth. 10/08/20   [provider]  omeprazole (PRILOSEC) 20 MG capsule Take 20 mg by mouth daily.    [provider]  [facility] ULTRA test strip 2 (two) times daily. 04/06/20   [provider]  rosuvastatin (CRESTOR) 40 MG tablet Take 40 mg by mouth daily.     [provider]    Allergies    Patient has no  known allergies.  Review of Systems   Review of Systems  Constitutional:  Negative for chills and fever.  HENT: Negative.    Respiratory:  Negative for cough and shortness of breath.   Cardiovascular:  Negative for chest pain.  Gastrointestinal:  Negative for abdominal pain, nausea and vomiting.  Genitourinary:  Negative for flank pain.  Musculoskeletal:  Positive for arthralgias, back pain and neck pain.  Skin:  Negative for color change and wound.  Neurological:  Positive for weakness and numbness. Negative for syncope, light-headedness and headaches.  All other systems reviewed and are negative.  Physical Exam Updated Vital Signs BP (!) 126/59 (BP Location: Left Arm)   Pulse (!) 107   Temp 98.3 F (36.8 C) (Oral)   Resp 20   Ht 5\' 8"  (1.727 m)   Wt 68.5 kg   SpO2 92%   BMI 22.96 kg/m   Physical Exam Vitals and nursing note reviewed.  Constitutional:      General: He is not in acute distress.    Appearance: Normal appearance. He is well-developed. He is not ill-appearing or diaphoretic.     Comments: Alert, appears uncomfortable but is in no acute distress  HENT:     Head: Normocephalic and atraumatic.     Comments: No hematoma, step-off or deformity Eyes:     General:        Right eye: No discharge.        Left eye: No discharge.   Neck:     Comments: Aspen collar in place, there is midline tenderness as well as diffuse muscular tenderness, palpable spasm of the trapezius worse on the right Cardiovascular:     Rate and Rhythm: Normal rate.  Pulmonary:     Effort: Pulmonary effort is normal. No respiratory distress.  Musculoskeletal:     Cervical back: Tenderness present.     Comments: There is midline and paraspinal tenderness over the upper thoracic spine, no midline lumbar spine tenderness, no step-off or deformity There is also tenderness over the right shoulder without appreciable bony deformity.  Pain seems to be worse with movement and extending from the right upper extremity  Skin:    General: Skin is warm and dry.  Neurological:     Mental Status: He is alert and oriented to person, place, and time.     Coordination: Coordination normal.     Comments: Speech is clear, able to follow commands CN III-XII intact Strength is 5/5 in left upper and lower extremity and right lower extremity, strength in the right upper extremity seems to be limited by pain, 4+/5 patient reports slightly decreased sensation to light touch in the right upper extremity compared to his other extremities. Sensation normal to light and sharp touch Moves extremities without ataxia, coordination intact  Psychiatric:        Mood and Affect: Mood normal.        Behavior: Behavior normal.    ED Results / Procedures / Treatments   Labs (all labs ordered are listed, but only abnormal results are displayed) Labs Reviewed  BASIC METABOLIC PANEL - Abnormal; Notable for the following components:      Result Value   Glucose, Bld 169 (*)    BUN 30 (*)    Creatinine, Ser 1.73 (*)    GFR, Estimated 38 (*)    All other components within normal limits  CBC WITH DIFFERENTIAL/PLATELET - Abnormal; Notable for the following components:   Hemoglobin 12.3 (*)    HCT 37.5 (*)  Platelets 139 (*)    Neutro Abs 7.9 (*)    Monocytes Absolute 1.3  (*)    All other components within normal limits    EKG None  Radiology DG Shoulder Right  Result Date: 09/08/2021 CLINICAL DATA:  Status post fall.  Right shoulder pain. EXAM: RIGHT SHOULDER - 2+ VIEW COMPARISON:  None. FINDINGS: There is no evidence of fracture or dislocation. High riding right humerus, usually associated with chronic rotator cuff injury. Osteoarthritic changes at the acromioclavicular joint. Soft tissues are unremarkable. IMPRESSION: 1. No acute fracture or dislocation identified about the right shoulder. 2. High riding right humerus, usually associated with chronic rotator cuff injury. Electronically Signed   By: Fidela Salisbury M.D.   On: 09/08/2021 18:02   MR Cervical Spine Wo Contrast  Result Date: 09/08/2021 CLINICAL DATA:  Neck trauma EXAM: MRI CERVICAL AND THORACIC SPINE WITHOUT CONTRAST TECHNIQUE: Multiplanar and multiecho pulse sequences of the cervical spine, to include the craniocervical junction and cervicothoracic junction, and the thoracic spine, were obtained without intravenous contrast. COMPARISON:  None. FINDINGS: MRI CERVICAL SPINE FINDINGS Alignment: Grade 1 retrolisthesis at C3-4 and grade 1 anterolisthesis at C6-7. Vertebrae: No fracture, evidence of discitis, or bone lesion. Cord: Foci of hyperintense T2-weighted signal within the spinal cord at the C2 and C5 levels. Posterior decompression from C3-C6. Posterior Fossa, vertebral arteries, paraspinal tissues: Negative Disc levels: C1-2: Unremarkable. C2-3: Moderate right facet hypertrophy and small central disc protrusion. There is no spinal canal stenosis. No neural foraminal stenosis. C3-4: Intermediate sized disc bulge with uncovertebral hypertrophy. Posterior decompression with no spinal canal stenosis. Severe right and mild left neural foraminal stenosis. C4-5: Fusion across the disc space. Posterior decompression. There is no spinal canal stenosis. Moderate bilateral neural foraminal stenosis. C5-6:  Bilateral uncovertebral hypertrophy. Posterior decompression. There is no spinal canal stenosis. Moderate bilateral neural foraminal stenosis. C6-7: Small disc bulge with bilateral uncovertebral hypertrophy. There is no spinal canal stenosis. No neural foraminal stenosis. C7-T1: Normal disc space and facet joints. There is no spinal canal stenosis. No neural foraminal stenosis. MRI THORACIC SPINE FINDINGS Alignment:  Physiologic. Vertebrae: No fracture, evidence of discitis, or bone lesion. Cord:  Normal signal and morphology. Paraspinal and other soft tissues: Negative. Disc levels: T6-7: Small central disc protrusion without spinal canal or neural foraminal stenosis. The other levels are unremarkable. IMPRESSION: 1. Foci of hyperintense T2-weighted signal within the spinal cord at the C2 and C5 levels, likely myelomalacia. 2. No acute fracture or ligamentous injury of the cervical or thoracic spine. 3. Multilevel cervical degenerative disc disease with moderate-to-severe neural foraminal stenosis at C3-4 and C5-6. 4. No thoracic spinal canal or neural foraminal stenosis. Electronically Signed   By: Ulyses Jarred M.D.   On: 09/08/2021 19:29   MR THORACIC SPINE WO CONTRAST  Result Date: 09/08/2021 CLINICAL DATA:  Neck trauma EXAM: MRI CERVICAL AND THORACIC SPINE WITHOUT CONTRAST TECHNIQUE: Multiplanar and multiecho pulse sequences of the cervical spine, to include the craniocervical junction and cervicothoracic junction, and the thoracic spine, were obtained without intravenous contrast. COMPARISON:  None. FINDINGS: MRI CERVICAL SPINE FINDINGS Alignment: Grade 1 retrolisthesis at C3-4 and grade 1 anterolisthesis at C6-7. Vertebrae: No fracture, evidence of discitis, or bone lesion. Cord: Foci of hyperintense T2-weighted signal within the spinal cord at the C2 and C5 levels. Posterior decompression from C3-C6. Posterior Fossa, vertebral arteries, paraspinal tissues: Negative Disc levels: C1-2: Unremarkable. C2-3:  Moderate right facet hypertrophy and small central disc protrusion. There is no spinal canal stenosis. No  neural foraminal stenosis. C3-4: Intermediate sized disc bulge with uncovertebral hypertrophy. Posterior decompression with no spinal canal stenosis. Severe right and mild left neural foraminal stenosis. C4-5: Fusion across the disc space. Posterior decompression. There is no spinal canal stenosis. Moderate bilateral neural foraminal stenosis. C5-6: Bilateral uncovertebral hypertrophy. Posterior decompression. There is no spinal canal stenosis. Moderate bilateral neural foraminal stenosis. C6-7: Small disc bulge with bilateral uncovertebral hypertrophy. There is no spinal canal stenosis. No neural foraminal stenosis. C7-T1: Normal disc space and facet joints. There is no spinal canal stenosis. No neural foraminal stenosis. MRI THORACIC SPINE FINDINGS Alignment:  Physiologic. Vertebrae: No fracture, evidence of discitis, or bone lesion. Cord:  Normal signal and morphology. Paraspinal and other soft tissues: Negative. Disc levels: T6-7: Small central disc protrusion without spinal canal or neural foraminal stenosis. The other levels are unremarkable. IMPRESSION: 1. Foci of hyperintense T2-weighted signal within the spinal cord at the C2 and C5 levels, likely myelomalacia. 2. No acute fracture or ligamentous injury of the cervical or thoracic spine. 3. Multilevel cervical degenerative disc disease with moderate-to-severe neural foraminal stenosis at C3-4 and C5-6. 4. No thoracic spinal canal or neural foraminal stenosis. Electronically Signed   By: Ulyses Jarred M.D.   On: 09/08/2021 19:29    Procedures Procedures   Medications Ordered in ED Medications  methocarbamol (ROBAXIN) tablet 500 mg (500 mg Oral Given 09/08/21 1722)  morphine 4 MG/ML injection 4 mg (4 mg Intravenous Given 09/08/21 1722)  ondansetron (ZOFRAN) injection 4 mg (4 mg Intravenous Given 09/08/21 1722)  oxyCODONE-acetaminophen  (PERCOCET/ROXICET) 5-325 MG per tablet 1 tablet (1 tablet Oral Given 09/08/21 2117)  predniSONE (DELTASONE) tablet 60 mg (60 mg Oral Given 09/08/21 2118)    ED Course  I have reviewed the triage vital signs and the nursing notes.  Pertinent labs & imaging results that were available during my care of the patient were reviewed by me and considered in my medical decision making (see chart for details).    MDM Rules/Calculators/A&P                           87 year old male presents for evaluation after a fall 48 hours ago, fell backwards off a porch, was initially evaluated at [facility] with trauma scans which were reportedly negative, patient discharged in an [place] c-collar for help with pain and support, has had worsening pain that is now limiting his mobility, pain in particular seems to start in the neck and upper thoracic spine and radiate into the right upper extremity with some weakness and decreased sensation in the right upper extremity, prior history of cervical spine surgery.  Given patient's severe pain and limited mobility.  Will get MRIs of the cervical and thoracic spine as well as x-rays of the right shoulder and basic lab work.  Medications given for pain control.  I have independently ordered, reviewed and interpreted all labs and imaging: CBC: No leukocytosis, stable hemoglobin BMP: Leukos 169, no other significant electrolyte derangements, renal function is at baseline  X-rays of the right shoulder without acute bony abnormality, high riding right humerus is typically associated with prior rotator cuff injury which patient does have history of.  MRIs of the cervical and thoracic spine show Foci of hyperintense T2-weighted signal within the spinal cord at the C2 and C5 levels, likely myelomalacia. No acute fracture or ligamentous injury of the cervical or thoracic spine. Multilevel cervical degenerative disc disease with moderate-to-severe neural foraminal stenosis at  C3-4 and C5-6.  No thoracic spinal canal or neural foraminal stenosis.   I discussed MRI findings with Dr. Arnoldo Morale with neurosurgery, feels that myelomalacia noted on MRI is likely chronic from patient's prior surgery.  Unrelated to trauma today.  Recommends continued pain control and outpatient follow-up with neurosurgeon.  On reevaluation patient was able to ambulate with walker did have a lot of pain after ambulation but after additional pain medications patient does report he is feeling better while moving around and feels that he would like to go home and try and manage his pain at home.  We will schedule close follow-up with Dr. Vertell Limber.  Will treat patient with short course of steroids, patient's blood sugar is fairly well controlled but he will watch this closely.  Patient and daughter expressed understanding and agreement.  Discharged home in good condition.  Final Clinical Impression(s) / ED Diagnoses Final diagnoses:  Fall, subsequent encounter  Neck pain  Acute midline thoracic back pain  Myelomalacia of cervical cord [facility])    Rx / DC Orders ED Discharge Orders          Ordered    oxyCODONE-acetaminophen (PERCOCET) 5-325 MG tablet  Every 6 hours PRN        09/08/21 2210    methylPREDNISolone (MEDROL DOSEPAK) 4 MG TBPK tablet        09/08/21 2210             Jacqlyn Larsen, [place] 09/10/21 1410    Tegeler, Gwenyth Allegra, MD 09/11/21 [phone]

## 2021-10-15 DIAGNOSIS — I10 Essential (primary) hypertension: Secondary | ICD-10-CM | POA: Diagnosis not present

## 2021-10-15 DIAGNOSIS — Z6824 Body mass index (BMI) 24.0-24.9, adult: Secondary | ICD-10-CM | POA: Diagnosis not present

## 2021-10-15 DIAGNOSIS — M4722 Other spondylosis with radiculopathy, cervical region: Secondary | ICD-10-CM | POA: Diagnosis not present

## 2021-11-02 IMAGING — PT NM PET TUM IMG INITIAL (PI) SKULL BASE T - THIGH
2 series · 20 of 25 positions shown · non-contrast
Comparison: Multiple exams, including bone survey of 03/29/2020 and
CT abdomen of 03/31/2019 as well as CT neck from 04/05/2020

CLINICAL DATA: Initial treatment strategy for left apical lung
nodule.

EXAM:
NUCLEAR MEDICINE PET SKULL BASE TO THIGH
TECHNIQUE: 11.2 mCi F-18 FDG was injected intravenously. Full-ring PET imaging
was performed from the skull base to thigh after the radiotracer. CT
data was obtained and used for attenuation correction and anatomic
localization.
Fasting blood glucose: 123 mg/dl

[mip · 4 of 48 slices shown]
[im 1/48]
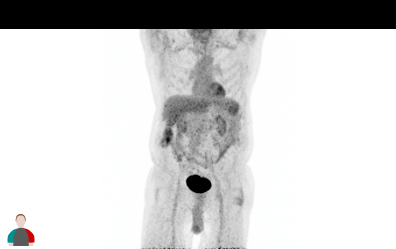
[im 12/48]
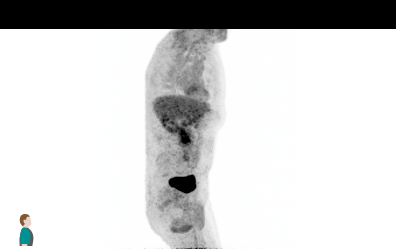
[im 36/48]
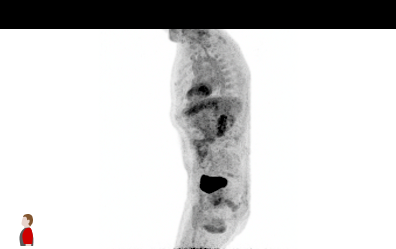
[im 48/48]
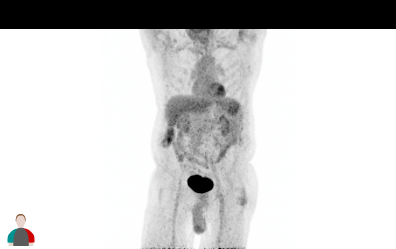

[axial ct wb fusion · 16 of 175 slices shown]
[im 1/175]
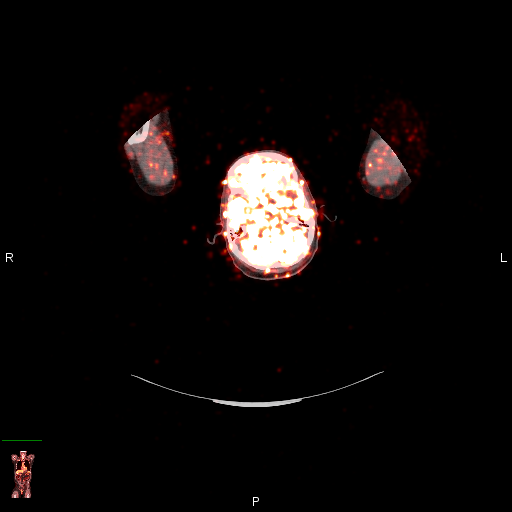
[im 10/175]
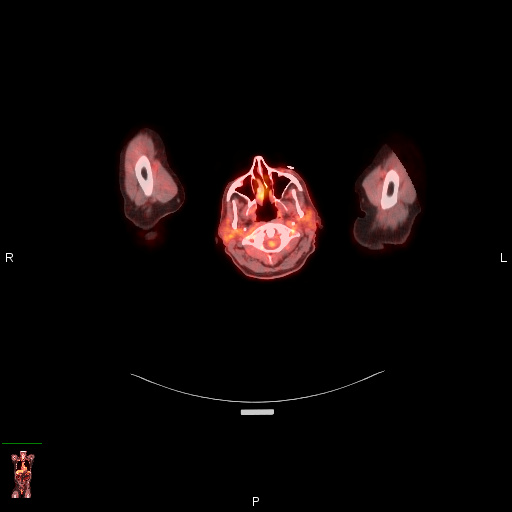
[im 28/175]
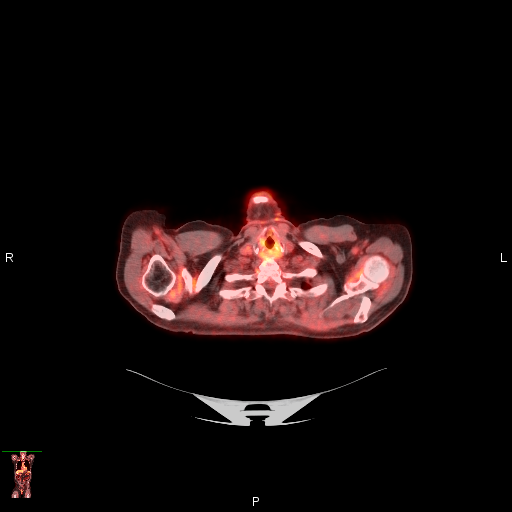
[im 37/175]
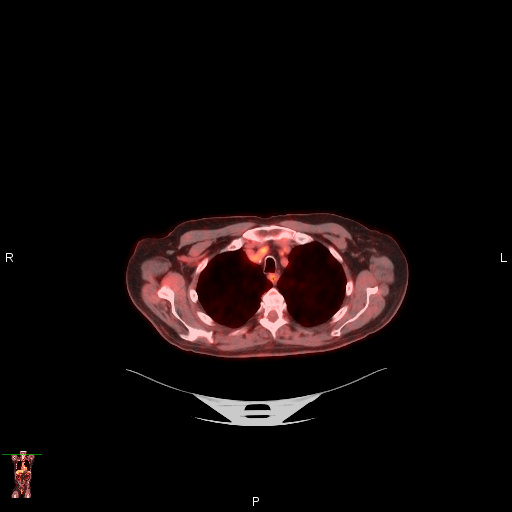
[im 46/175]
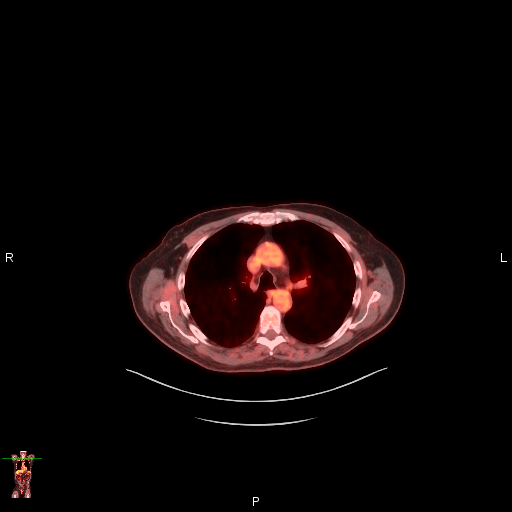
[im 55/175]
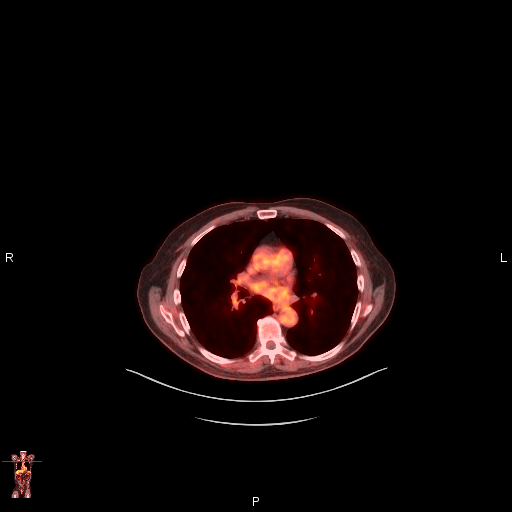
[im 74/175]
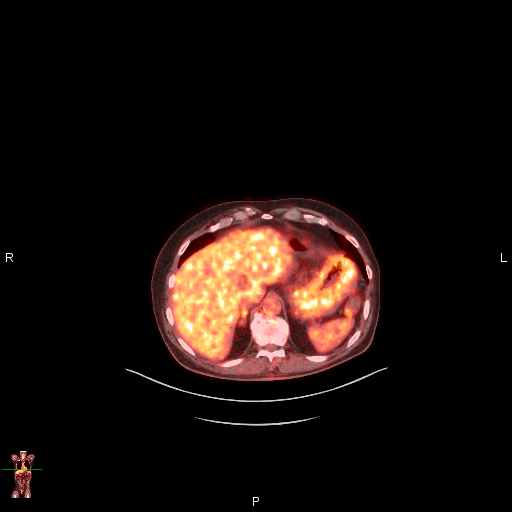
[im 83/175]
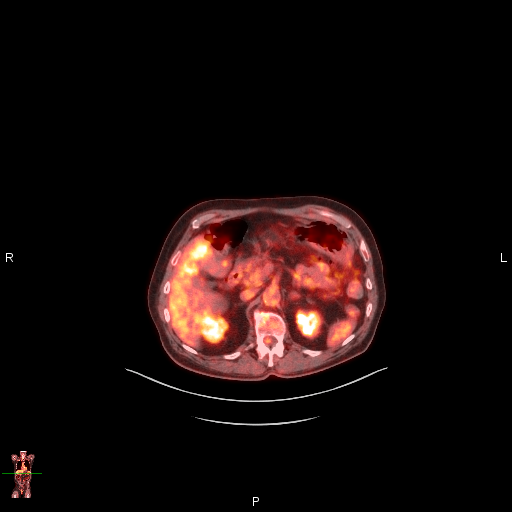
[im 92/175]
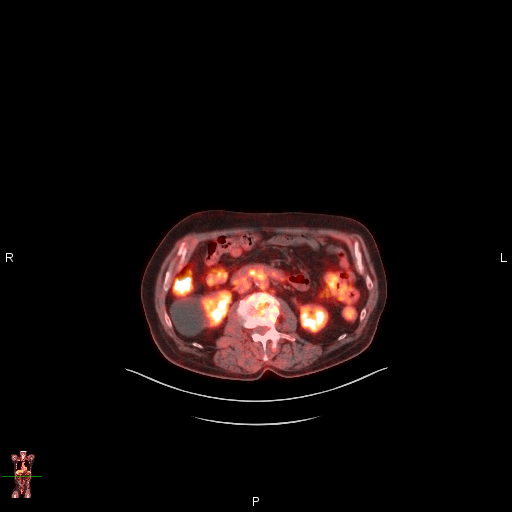
[im 101/175]
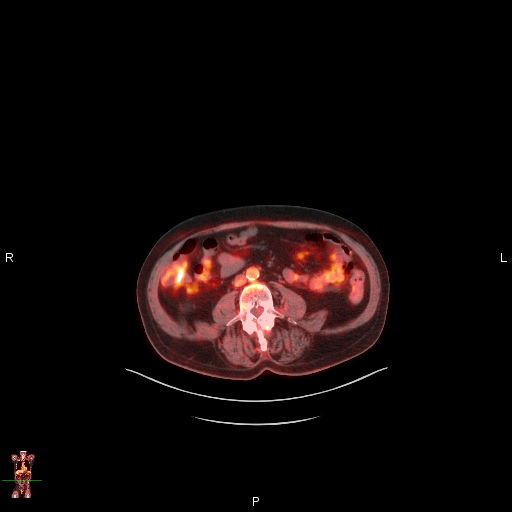
[im 120/175]
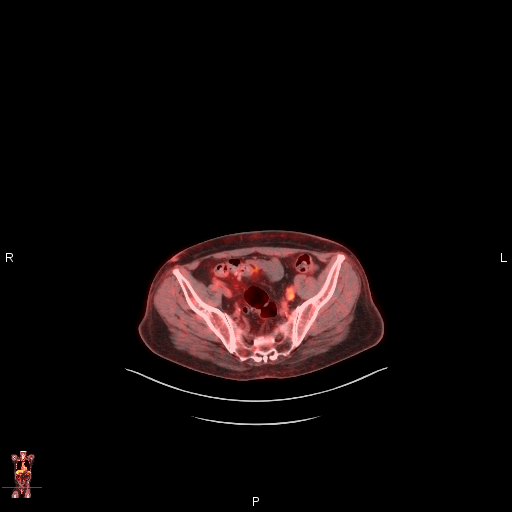
[im 129/175]
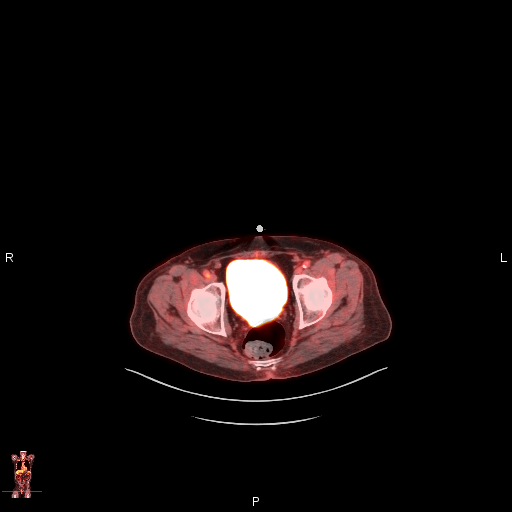
[im 138/175]
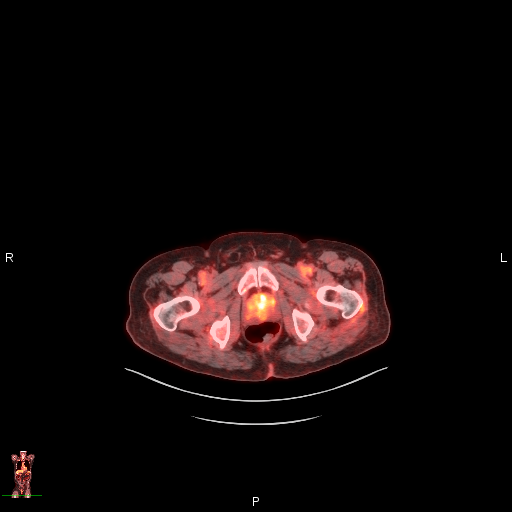
[im 147/175]
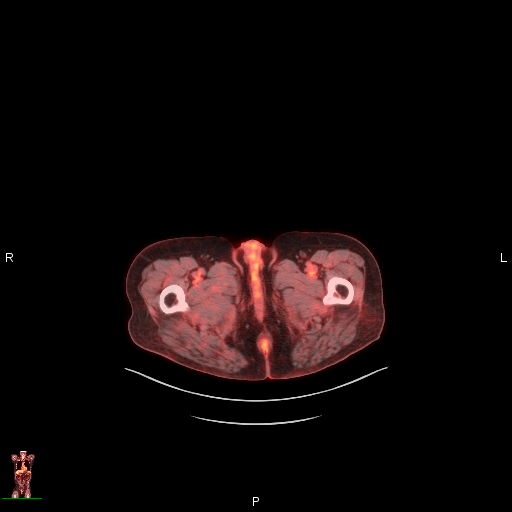
[im 165/175]
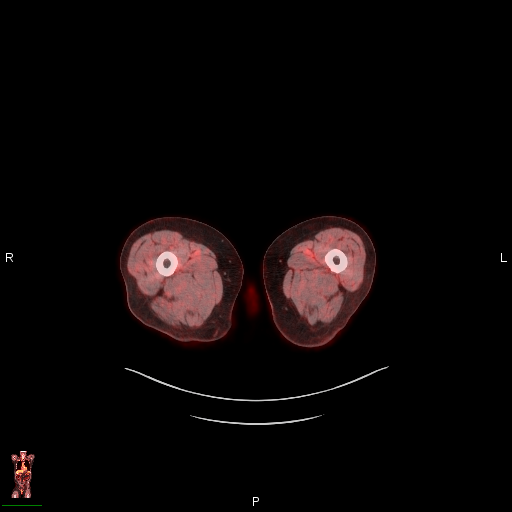
[im 175/175]
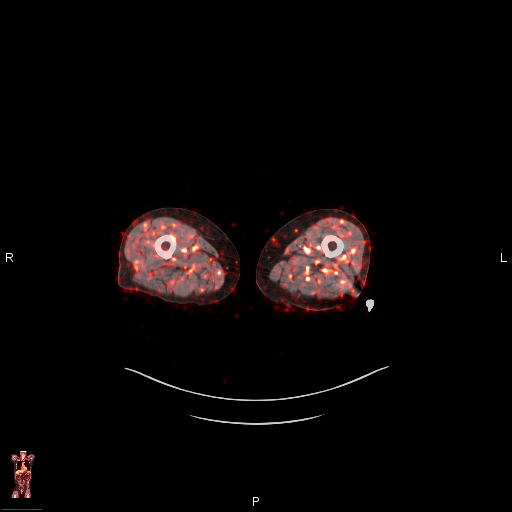

[20 of 25 positions shown; findings below may reference images not displayed]

FINDINGS: Mediastinal blood pool activity: SUV max

Liver activity: SUV max NA

NECK: No significant abnormal hypermetabolic activity in this
region.

Incidental CT findings: Chronic ethmoid and right maxillary
sinusitis. Multilevel cervical laminectomy. Multilevel cervical
foraminal impingement. Bilateral common carotid atherosclerotic
calcifications.

CHEST: There is biapical scarring but with asymmetric nodularity on
the left side measuring 2.1 by 2.1 cm on image 65/3 with maximum SUV
of 2.2.

Incidental CT findings: Coronary, aortic arch, and branch vessel
atherosclerotic vascular disease. Centrilobular emphysema.

ABDOMEN/PELVIS: No significant abnormal hypermetabolic activity in
this region.

Incidental CT findings: Hypodense right mid kidney lesion 2.9 by
cm, photopenic and likely a cyst. Similar medial 2.3 cm lesion is
photopenic from the right mid kidney and likely a cyst.

Aortoiliac atherosclerotic vascular disease. Prostatomegaly. Sigmoid
colon diverticulosis. Low-density fullness of the left adrenal gland
without discrete mass.

SKELETON: No significant abnormal hypermetabolic activity in this
region.

Incidental CT findings: Lumbar spondylosis and degenerative disc
disease with multilevel subluxations and associated impingement.
IMPRESSION: 1. 2.1 by 2.1 cm left apical nodule has a maximum SUV of 2.2. Given
the asymmetric appearance there is a distinct possibility of
low-grade adenocarcinoma despite the low-grade degree of activity.
Inflammatory scarring from granulomatous disease is a differential
diagnostic consideration. Percutaneous biopsy may carry increased
risk of pneumothorax given the degree of emphysema. Possibilities
for further workup may include tissue sampling, close CT
surveillance, or presumptive removal.
2. Other imaging findings of potential clinical significance:
Chronic paranasal sinusitis. Multilevel cervical laminectomy with
multilevel cervical foraminal impingement. Aortic Atherosclerosis
(3C0CB-V5I.I). Coronary atherosclerosis. Emphysema ([id number].D).
Prostatomegaly. Sigmoid colon diverticulosis. Multilevel lumbar
impingement.

## 2021-11-04 DIAGNOSIS — M4722 Other spondylosis with radiculopathy, cervical region: Secondary | ICD-10-CM | POA: Diagnosis not present

## 2021-11-04 DIAGNOSIS — Z9889 Other specified postprocedural states: Secondary | ICD-10-CM | POA: Diagnosis not present

## 2021-12-03 ENCOUNTER — Inpatient Hospital Stay (HOSPITAL_COMMUNITY): Payer: No Typology Code available for payment source | Attending: Hematology

## 2021-12-03 ENCOUNTER — Ambulatory Visit (HOSPITAL_COMMUNITY): Payer: No Typology Code available for payment source

## 2021-12-09 ENCOUNTER — Ambulatory Visit (HOSPITAL_COMMUNITY): Payer: Medicare HMO | Admitting: Hematology

## 2021-12-27 DIAGNOSIS — D631 Anemia in chronic kidney disease: Secondary | ICD-10-CM | POA: Diagnosis not present

## 2021-12-27 DIAGNOSIS — N189 Chronic kidney disease, unspecified: Secondary | ICD-10-CM | POA: Diagnosis not present

## 2021-12-27 DIAGNOSIS — I129 Hypertensive chronic kidney disease with stage 1 through stage 4 chronic kidney disease, or unspecified chronic kidney disease: Secondary | ICD-10-CM | POA: Diagnosis not present

## 2021-12-27 DIAGNOSIS — E875 Hyperkalemia: Secondary | ICD-10-CM | POA: Diagnosis not present

## 2021-12-27 DIAGNOSIS — R809 Proteinuria, unspecified: Secondary | ICD-10-CM | POA: Diagnosis not present

## 2021-12-27 DIAGNOSIS — D472 Monoclonal gammopathy: Secondary | ICD-10-CM | POA: Diagnosis not present

## 2021-12-27 DIAGNOSIS — E1122 Type 2 diabetes mellitus with diabetic chronic kidney disease: Secondary | ICD-10-CM | POA: Diagnosis not present

## 2021-12-27 DIAGNOSIS — E1129 Type 2 diabetes mellitus with other diabetic kidney complication: Secondary | ICD-10-CM | POA: Diagnosis not present

## 2022-01-01 DIAGNOSIS — R809 Proteinuria, unspecified: Secondary | ICD-10-CM | POA: Diagnosis not present

## 2022-01-01 DIAGNOSIS — E1122 Type 2 diabetes mellitus with diabetic chronic kidney disease: Secondary | ICD-10-CM | POA: Diagnosis not present

## 2022-01-01 DIAGNOSIS — E211 Secondary hyperparathyroidism, not elsewhere classified: Secondary | ICD-10-CM | POA: Diagnosis not present

## 2022-01-01 DIAGNOSIS — E1129 Type 2 diabetes mellitus with other diabetic kidney complication: Secondary | ICD-10-CM | POA: Diagnosis not present

## 2022-01-01 DIAGNOSIS — N189 Chronic kidney disease, unspecified: Secondary | ICD-10-CM | POA: Diagnosis not present

## 2022-01-01 DIAGNOSIS — I959 Hypotension, unspecified: Secondary | ICD-10-CM | POA: Diagnosis not present

## 2022-01-01 DIAGNOSIS — D631 Anemia in chronic kidney disease: Secondary | ICD-10-CM | POA: Diagnosis not present

## 2022-01-13 DIAGNOSIS — R972 Elevated prostate specific antigen [PSA]: Secondary | ICD-10-CM | POA: Diagnosis not present

## 2022-01-13 DIAGNOSIS — E1122 Type 2 diabetes mellitus with diabetic chronic kidney disease: Secondary | ICD-10-CM | POA: Diagnosis not present

## 2022-01-13 DIAGNOSIS — E039 Hypothyroidism, unspecified: Secondary | ICD-10-CM | POA: Diagnosis not present

## 2022-01-13 DIAGNOSIS — E782 Mixed hyperlipidemia: Secondary | ICD-10-CM | POA: Diagnosis not present

## 2022-01-17 DIAGNOSIS — R519 Headache, unspecified: Secondary | ICD-10-CM | POA: Diagnosis not present

## 2022-01-17 DIAGNOSIS — Z85118 Personal history of other malignant neoplasm of bronchus and lung: Secondary | ICD-10-CM | POA: Diagnosis not present

## 2022-01-17 DIAGNOSIS — E559 Vitamin D deficiency, unspecified: Secondary | ICD-10-CM | POA: Diagnosis not present

## 2022-01-17 DIAGNOSIS — K219 Gastro-esophageal reflux disease without esophagitis: Secondary | ICD-10-CM | POA: Diagnosis not present

## 2022-01-17 DIAGNOSIS — M109 Gout, unspecified: Secondary | ICD-10-CM | POA: Diagnosis not present

## 2022-01-17 DIAGNOSIS — N1832 Chronic kidney disease, stage 3b: Secondary | ICD-10-CM | POA: Diagnosis not present

## 2022-01-17 DIAGNOSIS — D472 Monoclonal gammopathy: Secondary | ICD-10-CM | POA: Diagnosis not present

## 2022-01-17 DIAGNOSIS — E1122 Type 2 diabetes mellitus with diabetic chronic kidney disease: Secondary | ICD-10-CM | POA: Diagnosis not present

## 2022-01-17 DIAGNOSIS — E039 Hypothyroidism, unspecified: Secondary | ICD-10-CM | POA: Diagnosis not present

## 2022-01-17 DIAGNOSIS — I129 Hypertensive chronic kidney disease with stage 1 through stage 4 chronic kidney disease, or unspecified chronic kidney disease: Secondary | ICD-10-CM | POA: Diagnosis not present

## 2022-01-17 DIAGNOSIS — R972 Elevated prostate specific antigen [PSA]: Secondary | ICD-10-CM | POA: Diagnosis not present

## 2022-01-17 DIAGNOSIS — E782 Mixed hyperlipidemia: Secondary | ICD-10-CM | POA: Diagnosis not present

## 2022-01-29 DIAGNOSIS — E1129 Type 2 diabetes mellitus with other diabetic kidney complication: Secondary | ICD-10-CM | POA: Diagnosis not present

## 2022-01-29 DIAGNOSIS — E211 Secondary hyperparathyroidism, not elsewhere classified: Secondary | ICD-10-CM | POA: Diagnosis not present

## 2022-01-29 DIAGNOSIS — I959 Hypotension, unspecified: Secondary | ICD-10-CM | POA: Diagnosis not present

## 2022-01-29 DIAGNOSIS — E1122 Type 2 diabetes mellitus with diabetic chronic kidney disease: Secondary | ICD-10-CM | POA: Diagnosis not present

## 2022-01-29 DIAGNOSIS — N189 Chronic kidney disease, unspecified: Secondary | ICD-10-CM | POA: Diagnosis not present

## 2022-01-29 DIAGNOSIS — R809 Proteinuria, unspecified: Secondary | ICD-10-CM | POA: Diagnosis not present

## 2022-01-29 DIAGNOSIS — D631 Anemia in chronic kidney disease: Secondary | ICD-10-CM | POA: Diagnosis not present

## 2022-02-06 DIAGNOSIS — D631 Anemia in chronic kidney disease: Secondary | ICD-10-CM | POA: Diagnosis not present

## 2022-02-06 DIAGNOSIS — D472 Monoclonal gammopathy: Secondary | ICD-10-CM | POA: Diagnosis not present

## 2022-02-06 DIAGNOSIS — E1129 Type 2 diabetes mellitus with other diabetic kidney complication: Secondary | ICD-10-CM | POA: Diagnosis not present

## 2022-02-06 DIAGNOSIS — N189 Chronic kidney disease, unspecified: Secondary | ICD-10-CM | POA: Diagnosis not present

## 2022-02-06 DIAGNOSIS — E1122 Type 2 diabetes mellitus with diabetic chronic kidney disease: Secondary | ICD-10-CM | POA: Diagnosis not present

## 2022-02-06 DIAGNOSIS — R809 Proteinuria, unspecified: Secondary | ICD-10-CM | POA: Diagnosis not present

## 2022-04-21 DIAGNOSIS — E1122 Type 2 diabetes mellitus with diabetic chronic kidney disease: Secondary | ICD-10-CM | POA: Diagnosis not present

## 2022-04-21 DIAGNOSIS — E039 Hypothyroidism, unspecified: Secondary | ICD-10-CM | POA: Diagnosis not present

## 2022-04-28 DIAGNOSIS — E039 Hypothyroidism, unspecified: Secondary | ICD-10-CM | POA: Diagnosis not present

## 2022-04-28 DIAGNOSIS — I129 Hypertensive chronic kidney disease with stage 1 through stage 4 chronic kidney disease, or unspecified chronic kidney disease: Secondary | ICD-10-CM | POA: Diagnosis not present

## 2022-04-28 DIAGNOSIS — Z85118 Personal history of other malignant neoplasm of bronchus and lung: Secondary | ICD-10-CM | POA: Diagnosis not present

## 2022-04-28 DIAGNOSIS — R972 Elevated prostate specific antigen [PSA]: Secondary | ICD-10-CM | POA: Diagnosis not present

## 2022-04-28 DIAGNOSIS — M109 Gout, unspecified: Secondary | ICD-10-CM | POA: Diagnosis not present

## 2022-04-28 DIAGNOSIS — D472 Monoclonal gammopathy: Secondary | ICD-10-CM | POA: Diagnosis not present

## 2022-04-28 DIAGNOSIS — E559 Vitamin D deficiency, unspecified: Secondary | ICD-10-CM | POA: Diagnosis not present

## 2022-04-28 DIAGNOSIS — K219 Gastro-esophageal reflux disease without esophagitis: Secondary | ICD-10-CM | POA: Diagnosis not present

## 2022-04-28 DIAGNOSIS — E782 Mixed hyperlipidemia: Secondary | ICD-10-CM | POA: Diagnosis not present

## 2022-04-28 DIAGNOSIS — R519 Headache, unspecified: Secondary | ICD-10-CM | POA: Diagnosis not present

## 2022-04-28 DIAGNOSIS — N1832 Chronic kidney disease, stage 3b: Secondary | ICD-10-CM | POA: Diagnosis not present

## 2022-04-28 DIAGNOSIS — E1122 Type 2 diabetes mellitus with diabetic chronic kidney disease: Secondary | ICD-10-CM | POA: Diagnosis not present

## 2022-05-06 DIAGNOSIS — D472 Monoclonal gammopathy: Secondary | ICD-10-CM | POA: Diagnosis not present

## 2022-05-06 DIAGNOSIS — D631 Anemia in chronic kidney disease: Secondary | ICD-10-CM | POA: Diagnosis not present

## 2022-05-06 DIAGNOSIS — R809 Proteinuria, unspecified: Secondary | ICD-10-CM | POA: Diagnosis not present

## 2022-05-06 DIAGNOSIS — E1122 Type 2 diabetes mellitus with diabetic chronic kidney disease: Secondary | ICD-10-CM | POA: Diagnosis not present

## 2022-05-06 DIAGNOSIS — N189 Chronic kidney disease, unspecified: Secondary | ICD-10-CM | POA: Diagnosis not present

## 2022-05-06 DIAGNOSIS — E1129 Type 2 diabetes mellitus with other diabetic kidney complication: Secondary | ICD-10-CM | POA: Diagnosis not present

## 2022-05-09 ENCOUNTER — Inpatient Hospital Stay: Payer: No Typology Code available for payment source | Attending: Hematology

## 2022-05-09 ENCOUNTER — Ambulatory Visit (HOSPITAL_COMMUNITY)
Admission: RE | Admit: 2022-05-09 | Discharge: 2022-05-09 | Disposition: A | Discharge status: Home / Self Care | Payer: No Typology Code available for payment source | Source: Ambulatory Visit | Attending: Hematology | Admitting: Hematology

## 2022-05-09 DIAGNOSIS — D472 Monoclonal gammopathy: Secondary | ICD-10-CM | POA: Insufficient documentation

## 2022-05-09 DIAGNOSIS — C3492 Malignant neoplasm of unspecified part of left bronchus or lung: Secondary | ICD-10-CM | POA: Diagnosis not present

## 2022-05-09 DIAGNOSIS — J90 Pleural effusion, not elsewhere classified: Secondary | ICD-10-CM | POA: Diagnosis not present

## 2022-05-09 DIAGNOSIS — C3490 Malignant neoplasm of unspecified part of unspecified bronchus or lung: Secondary | ICD-10-CM | POA: Diagnosis not present

## 2022-05-09 DIAGNOSIS — Z87891 Personal history of nicotine dependence: Secondary | ICD-10-CM | POA: Diagnosis not present

## 2022-05-09 DIAGNOSIS — Z85118 Personal history of other malignant neoplasm of bronchus and lung: Secondary | ICD-10-CM | POA: Diagnosis not present

## 2022-05-09 DIAGNOSIS — J439 Emphysema, unspecified: Secondary | ICD-10-CM | POA: Diagnosis not present

## 2022-05-09 LAB — COMPREHENSIVE METABOLIC PANEL
ALT: 26 U/L (ref 0–44)
AST: 18 U/L (ref 15–41)
Albumin: 3.8 g/dL (ref 3.5–5.0)
Alkaline Phosphatase: 45 U/L (ref 38–126)
Anion gap: 6 (ref 5–15)
BUN: 34 mg/dL — ABNORMAL HIGH (ref 8–23)
CO2: 27 mmol/L (ref 22–32)
Calcium: 9.2 mg/dL (ref 8.9–10.3)
Chloride: 106 mmol/L (ref 98–111)
Creatinine, Ser: 1.85 mg/dL — ABNORMAL HIGH (ref 0.61–1.24)
GFR, Estimated: 35 mL/min — ABNORMAL LOW (ref >60)
Glucose, Bld: 111 mg/dL — ABNORMAL HIGH (ref 70–99)
Potassium: 4.8 mmol/L (ref 3.5–5.1)
Sodium: 139 mmol/L (ref 135–145)
Total Bilirubin: 0.7 mg/dL (ref 0.3–1.2)
Total Protein: 7.5 g/dL (ref 6.5–8.1)

## 2022-05-09 LAB — CBC WITH DIFFERENTIAL/PLATELET
Abs Immature Granulocytes: 0.02 10*3/uL (ref 0.00–0.07)
Basophils Absolute: 0 10*3/uL (ref 0.0–0.1)
Basophils Relative: 1 %
Eosinophils Absolute: 0.3 10*3/uL (ref 0.0–0.5)
Eosinophils Relative: 5 %
HCT: 35 % — ABNORMAL LOW (ref 39.0–52.0)
Hemoglobin: 11.6 g/dL — ABNORMAL LOW (ref 13.0–17.0)
Immature Granulocytes: 0 %
Lymphocytes Relative: 22 %
Lymphs Abs: 1.4 10*3/uL (ref 0.7–4.0)
MCH: 29.7 pg (ref 26.0–34.0)
MCHC: 33.1 g/dL (ref 30.0–36.0)
MCV: 89.7 fL (ref 80.0–100.0)
Monocytes Absolute: 0.6 10*3/uL (ref 0.1–1.0)
Monocytes Relative: 9 %
Neutro Abs: 4.2 10*3/uL (ref 1.7–7.7)
Neutrophils Relative %: 63 %
Platelets: 153 10*3/uL (ref 150–400)
RBC: 3.9 MIL/uL — ABNORMAL LOW (ref 4.22–5.81)
RDW: 15.3 % (ref 11.5–15.5)
WBC: 6.6 10*3/uL (ref 4.0–10.5)
nRBC: 0 % (ref 0.0–0.2)

## 2022-05-12 LAB — KAPPA/LAMBDA LIGHT CHAINS
Kappa free light chain: 70.3 mg/L — ABNORMAL HIGH (ref 3.3–19.4)
Kappa, lambda light chain ratio: 2.74 — ABNORMAL HIGH (ref 0.26–1.65)
Lambda free light chains: 25.7 mg/L (ref 5.7–26.3)

## 2022-05-13 LAB — PROTEIN ELECTROPHORESIS, SERUM
A/G Ratio: 1.2 (ref 0.7–1.7)
Albumin ELP: 3.7 g/dL (ref 2.9–4.4)
Alpha-1-Globulin: 0.2 g/dL (ref 0.0–0.4)
Alpha-2-Globulin: 0.8 g/dL (ref 0.4–1.0)
Beta Globulin: 0.8 g/dL (ref 0.7–1.3)
Gamma Globulin: 1.4 g/dL (ref 0.4–1.8)
Globulin, Total: 3.2 g/dL (ref 2.2–3.9)
M-Spike, %: 0.4 g/dL — ABNORMAL HIGH
Total Protein ELP: 6.9 g/dL (ref 6.0–8.5)

## 2022-05-14 DIAGNOSIS — E1129 Type 2 diabetes mellitus with other diabetic kidney complication: Secondary | ICD-10-CM | POA: Diagnosis not present

## 2022-05-14 DIAGNOSIS — D631 Anemia in chronic kidney disease: Secondary | ICD-10-CM | POA: Diagnosis not present

## 2022-05-14 DIAGNOSIS — E1122 Type 2 diabetes mellitus with diabetic chronic kidney disease: Secondary | ICD-10-CM | POA: Diagnosis not present

## 2022-05-14 DIAGNOSIS — D472 Monoclonal gammopathy: Secondary | ICD-10-CM | POA: Diagnosis not present

## 2022-05-14 DIAGNOSIS — I129 Hypertensive chronic kidney disease with stage 1 through stage 4 chronic kidney disease, or unspecified chronic kidney disease: Secondary | ICD-10-CM | POA: Diagnosis not present

## 2022-05-14 DIAGNOSIS — E211 Secondary hyperparathyroidism, not elsewhere classified: Secondary | ICD-10-CM | POA: Diagnosis not present

## 2022-05-14 DIAGNOSIS — R809 Proteinuria, unspecified: Secondary | ICD-10-CM | POA: Diagnosis not present

## 2022-05-14 DIAGNOSIS — E875 Hyperkalemia: Secondary | ICD-10-CM | POA: Diagnosis not present

## 2022-05-14 DIAGNOSIS — N189 Chronic kidney disease, unspecified: Secondary | ICD-10-CM | POA: Diagnosis not present

## 2022-05-15 ENCOUNTER — Inpatient Hospital Stay (HOSPITAL_BASED_OUTPATIENT_CLINIC_OR_DEPARTMENT_OTHER): Payer: No Typology Code available for payment source | Admitting: Hematology

## 2022-05-15 VITALS — BP 135/62 | HR 74 | Temp 97.4°F | Resp 16 | Ht 68.0 in | Wt 154.5 lb

## 2022-05-15 DIAGNOSIS — C3492 Malignant neoplasm of unspecified part of left bronchus or lung: Secondary | ICD-10-CM

## 2022-05-15 DIAGNOSIS — D472 Monoclonal gammopathy: Secondary | ICD-10-CM

## 2022-05-15 NOTE — Progress Notes (Signed)
[place], [place] [address]   CLINIC:  Medical Oncology/Hematology  PCP:  Celene Squibb, MD [address] [place] [address] [phone]   REASON FOR VISIT:  Follow-up for left lung cancer and MGUS  PRIOR THERAPY: Left upper lung lobectomy on 06/22/2020  NGS Results: not done  CURRENT THERAPY: surveillance  BRIEF ONCOLOGIC HISTORY:  Oncology History  Adenocarcinoma of left lung, stage 1 ([place])  06/26/2020 Initial Diagnosis   Adenocarcinoma of left lung, stage 1 ([place])   06/26/2020 Cancer Staging   Staging form: Lung, AJCC 8th Edition - Clinical stage from 06/26/2020: Stage IA3 (cT1c, cN0, cM0) - Signed by Melrose Nakayama, MD on 06/26/2020     CANCER STAGING:  Cancer Staging  Adenocarcinoma of left lung, stage 1 ([place]) Staging form: Lung, AJCC 8th Edition - Clinical stage from 06/26/2020: Stage IA3 (cT1c, cN0, cM0) - Signed by Melrose Nakayama, MD on 06/26/2020   INTERVAL HISTORY:  [name], a 88 y.o. male, seen for follow-up of her left lung cancer and MGUS.  Denies any chest pains, cough or hemoptysis.  Denies any new onset pains.  No peripheral neuropathy.  No B symptoms.  No recurrent infections.  REVIEW OF SYSTEMS:  Review of Systems  Constitutional:  Negative for appetite change (80%), fatigue, fever and unexpected weight change.  Respiratory:  Negative for chest tightness and shortness of breath.   Gastrointestinal:  Positive for constipation.  Neurological:  Negative for headaches.  Psychiatric/Behavioral:  Positive for sleep disturbance.   All other systems reviewed and are negative.   PAST MEDICAL/SURGICAL HISTORY:  Past Medical History:  Diagnosis Date   Acid reflux disease    Arthritis    Complication of anesthesia    appendix surgery (88 year old) - passed out once patient got back to floor unit   Diabetes mellitus    Hiatal hernia    High cholesterol    Hypertension     Hypothyroidism    Monoclonal gammopathy of unknown significance (MGUS) 03/28/2020   Normocytic anemia 03/28/2020   Renal disorder    renal insufficiency   Type 2 diabetes mellitus ([place])    Past Surgical History:  Procedure Laterality Date   APPENDECTOMY     BRONCHIAL BIOPSY  05/29/2020   Procedure: BRONCHIAL BIOPSIES;  Surgeon: Collene Gobble, MD;  Location: [facility];  Service: Pulmonary;;   BRONCHIAL BRUSHINGS  05/29/2020   Procedure: BRONCHIAL BRUSHINGS;  Surgeon: Collene Gobble, MD;  Location: [facility];  Service: Pulmonary;;   BRONCHIAL NEEDLE ASPIRATION BIOPSY  05/29/2020   Procedure: BRONCHIAL NEEDLE ASPIRATION BIOPSIES;  Surgeon: Collene Gobble, MD;  Location: [facility];  Service: Pulmonary;;   BRONCHIAL WASHINGS  05/29/2020   Procedure: BRONCHIAL WASHINGS;  Surgeon: Collene Gobble, MD;  Location: [facility]OSCOPY;  Service: Pulmonary;;   EYE SURGERY     INTERCOSTAL NERVE BLOCK Left 06/22/2020   Procedure: INTERCOSTAL NERVE BLOCK;  Surgeon: Melrose Nakayama, MD;  Location: [facility];  Service: Thoracic;  Laterality: Left;   NODE DISSECTION Left 06/22/2020   Procedure: NODE DISSECTION;  Surgeon: Melrose Nakayama, MD;  Location: [facility];  Service: Thoracic;  Laterality: Left;   TONSILLECTOMY     VIDEO BRONCHOSCOPY WITH ENDOBRONCHIAL NAVIGATION N/A 05/29/2020   Procedure: VIDEO BRONCHOSCOPY WITH ENDOBRONCHIAL NAVIGATION;  Surgeon: Collene Gobble, MD;  Location: [facility];  Service: Pulmonary;  Laterality: N/A;    SOCIAL HISTORY:  Social  History   Socioeconomic History   Marital status: Married    Spouse name: Not on file   Number of children: 4   Years of education: Not on file   Highest education level: Not on file  Occupational History   Occupation: RETIRED  Tobacco Use   Smoking status: Former    Packs/day: 0.50    Years: 47.00    Total pack years: 23.50    Types: Cigarettes    Quit date: 2000    Years since quitting: 23.6   Smokeless tobacco: Never   Vaping Use   Vaping Use: Never used  Substance and Sexual Activity   Alcohol use: No   Drug use: No   Sexual activity: Not Currently  Other Topics Concern   Not on file  Social History Narrative   Not on file   Social Determinants of Health   Financial Resource Strain: Low Risk  (10/25/2020)   Overall Financial Resource Strain (CARDIA)    Difficulty of Paying Living Expenses: Not hard at all  Food Insecurity: No Food Insecurity (10/25/2020)   Hunger Vital Sign    Worried About Running Out of Food in the Last Year: Never true    [place] in the Last Year: Never true  Transportation Needs: No Transportation Needs (10/25/2020)   PRAPARE - Hydrologist (Medical): No    Lack of Transportation (Non-Medical): No  Physical Activity: Sufficiently Active (10/25/2020)   Exercise Vital Sign    Days of Exercise per Week: 7 days    Minutes of Exercise per Session: 30 min  Stress: No Stress Concern Present (10/25/2020)   [place]    Feeling of Stress : Only a little  Social Connections: Moderately Integrated (10/25/2020)   Social Connection and Isolation Panel [NHANES]    Frequency of Communication with Friends and Family: More than three times a week    Frequency of Social Gatherings with Friends and Family: More than three times a week    Attends Religious Services: More than 4 times per year    Active Member of [employer] or Organizations: No    Attends Archivist Meetings: Not on file    Marital Status: Married  Human resources officer Violence: Not At Risk (10/25/2020)   Humiliation, Afraid, Rape, and Kick questionnaire    Fear of Current or Ex-Partner: No    Emotionally Abused: No    Physically Abused: No    Sexually Abused: No    FAMILY HISTORY:  Family History  Problem Relation Age of Onset   Diabetes Mother    Cancer Father    Cancer Sister    Diabetes Brother    Cancer  Brother    Diabetes Brother    Parkinson's disease Brother    Diabetes Brother     CURRENT MEDICATIONS:  Current Outpatient Medications  Medication Sig Dispense Refill   acetaminophen (TYLENOL) 500 MG tablet Take 2 tablets (1,000 mg total) by mouth every 6 (six) hours. 30 tablet 0   Cholecalciferol 50 MCG (2000 UT) TABS Take 2,000 Units by mouth daily.      docusate sodium (COLACE) 100 MG capsule Take 1 capsule (100 mg total) by mouth 2 (two) times daily as needed for mild constipation. 10 capsule 0   gabapentin (NEURONTIN) 300 MG capsule Take 300 mg by mouth 3 (three) times daily.     glipiZIDE (GLUCOTROL XL) 2.5 MG 24 hr tablet Take  2.5 mg by mouth daily as needed (blood sugar above 160).      Lancets (ONETOUCH DELICA PLUS [id number]) MISC 2 (two) times daily.     levothyroxine (SYNTHROID) 75 MCG tablet Take 75 mcg by mouth daily.     LOKELMA 5 g packet Take by mouth.     methylPREDNISolone (MEDROL DOSEPAK) 4 MG TBPK tablet Take as directed 21 tablet 0   omeprazole (PRILOSEC) 20 MG capsule Take 20 mg by mouth daily.     ONETOUCH ULTRA test strip 2 (two) times daily.     oxyCODONE-acetaminophen (PERCOCET) 5-325 MG tablet Take 1 tablet by mouth every 6 (six) hours as needed. 10 tablet 0   rosuvastatin (CRESTOR) 40 MG tablet Take 40 mg by mouth daily.      amLODipine (NORVASC) 10 MG tablet Take 10 mg by mouth daily.      No current facility-administered medications for this visit.    ALLERGIES:  No Known Allergies  PHYSICAL EXAM:  Performance status (ECOG): 1 - Symptomatic but completely ambulatory  Vitals:   05/15/22 1401  BP: 135/62  Pulse: 74  Resp: 16  Temp: (!) 97.4 F (36.3 C)  SpO2: 99%   Wt Readings from Last 3 Encounters:  05/15/22 154 lb 8 oz (70.1 kg)  09/08/21 151 lb (68.5 kg)  06/18/21 151 lb (68.5 kg)   Physical Exam Vitals reviewed.  Constitutional:      Appearance: Normal appearance.  Cardiovascular:     Rate and Rhythm: Normal rate and regular  rhythm.     Pulses: Normal pulses.     Heart sounds: Normal heart sounds.  Pulmonary:     Effort: Pulmonary effort is normal.     Breath sounds: Normal breath sounds.  Neurological:     General: No focal deficit present.     Mental Status: He is alert and oriented to person, place, and time.  Psychiatric:        Mood and Affect: Mood normal.        Behavior: Behavior normal.      LABORATORY DATA:  I have reviewed the labs as listed.     Latest Ref Rng & Units 05/09/2022    2:42 PM 09/08/2021    4:29 PM 05/29/2021   10:10 AM  CBC  WBC 4.0 - 10.5 K/uL 6.6  10.2  7.2   Hemoglobin 13.0 - 17.0 g/dL 11.6  12.3  12.5   Hematocrit 39.0 - 52.0 % 35.0  37.5  38.3   Platelets 150 - 400 K/uL 153  139  181       Latest Ref Rng & Units 05/09/2022    2:42 PM 09/08/2021    4:29 PM 05/29/2021   10:10 AM  CMP  Glucose 70 - 99 mg/dL 111  169  123   BUN 8 - 23 mg/dL 34  30  32   Creatinine 0.61 - 1.24 mg/dL 1.85  1.73  1.86   Sodium 135 - 145 mmol/L 139  135  134   Potassium 3.5 - 5.1 mmol/L 4.8  3.9  4.9   Chloride 98 - 111 mmol/L 106  104  102   CO2 22 - 32 mmol/L 27  24  26    Calcium 8.9 - 10.3 mg/dL 9.2  9.6  9.4   Total Protein 6.5 - 8.1 g/dL 7.5   7.9   Total Bilirubin 0.3 - 1.2 mg/dL 0.7   0.4   Alkaline Phos 38 - 126 U/L 45  48   AST 15 - 41 U/L 18   17   ALT 0 - 44 U/L 26   20     DIAGNOSTIC IMAGING:  I have independently reviewed the scans and discussed with the patient. CT Chest Wo Contrast  Result Date: 05/11/2022 CLINICAL DATA:  Lung cancer status post left upper lobectomy, MGUS. Surveillance examination EXAM: CT CHEST WITHOUT CONTRAST TECHNIQUE: Multidetector CT imaging of the chest was performed following the standard protocol without IV contrast. RADIATION DOSE REDUCTION: This exam was performed according to the departmental dose-optimization program which includes automated exposure control, adjustment of the mA and/or kV according to patient size and/or use of iterative  reconstruction technique. COMPARISON:  05/29/2021, 01/17/2021 FINDINGS: Cardiovascular: Moderate multi-vessel coronary artery calcification. Global cardiac size within normal limits. No pericardial effusion. Central pulmonary arteries are of normal caliber. Moderate atherosclerotic calcification within the thoracic aorta. No aortic aneurysm. Mediastinum/Nodes: Visualized thyroid is unremarkable. No pathologic thoracic adenopathy. Esophagus unremarkable. Small hiatal hernia. Lungs/Pleura: Status post left upper lobectomy with associated left-sided volume loss. Small left complex pleural effusion with pleural thickening in keeping with a fibrothorax demonstrates slight interval decrease in size. Pleural based nodularity within the anterior aspect of the residual left lower lobe, axial image # 92/4, is unchanged. Advanced emphysematous changes again noted. No new focal pulmonary nodule or infiltrate. No pneumothorax. No central obstructing lesion. Upper Abdomen: Adrenal glands are unremarkable. No acute abnormality. Musculoskeletal: Anterolisthesis C6-7 with associated degenerative change is un changed from MRI examination of 09/08/2021. No acute bone abnormality. No lytic or blastic bone lesion. IMPRESSION: 1. Status post left upper lobectomy. No evidence of residual or recurrent disease within the thorax. 2. 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 3. Moderate multi-vessel coronary artery calcification. 4. Advanced emphysematous changes. Aortic Atherosclerosis (ICD10-I70.0) and Emphysema (ICD10-J43.9). Electronically Signed   By: Fidela Salisbury M.D.   On: 05/11/2022 04:23     ASSESSMENT:  1.  IgM kappa MGUS: -Serum immunofixation shows IgM kappa. -SPEP was negative.  Free kappa light chains of 48.5, lambda light chains 24.8, ratio of 1.96. -Beta-2 microglobulin 3.2.  LDH 105. -Creatinine 1.95 and calcium 9.7. -24-hour urine total protein was 572 mg.  M spike was negative. -Skeletal survey on  03/29/2020 did not show any lytic lesions. -PET scan on 04/09/2020 did not show any skeletal abnormalities.   2.  PT 1 CPN 0 left lung adenocarcinoma: -He quit smoking in 1999, smoked about 46 pack years. -PET scan on 04/09/2020 showed 2.1 x 2.1 cm left apical nodule with SUV 2.2.  Possibility of low-grade adenocarcinoma. -Navigational bronchoscopy and biopsy of the left upper lobe lung lesion on 05/29/2020 consistent with adenocarcinoma. -Left upper lobectomy and lymph node biopsy on 06/22/2020.  Pathology shows invasive adenocarcinoma, 3 cm, no visceral pleural invasion identified.  Resection margins negative.  0/4 hilar lymph nodes negative for carcinoma. - CT chest with contrast on 10/24/2020 shows postoperative changes with moderate sized loculated appearing left pleural effusion with no enhancing pleural nodules.  Stable small scattered mediastinal and hilar lymph nodes.   PLAN:  1.  IgM kappa MGUS: - Denies any recurrent infections or new bone pains or B symptoms. - Reviewed labs from 05/09/2022.  M spike is stable at 0.4 g.  Calcium was 9.2 and creatinine 1.85.  Hemoglobin 11.6 and stable.  Kappa light chains have increased to 70.3 and ratio of 2.74. - There is slight worsening of free light chain ratio but everything else was stable.  Will  monitor in 6 months with repeat labs.  We will arrange for a skeletal survey on the same day of his next visit.   2.  Left lung adenocarcinoma: - Reviewed CT chest from 05/09/2022 which showed left upper lobectomy with no evidence of recurrence.  Interval decrease in size of left fibrothorax. - Recommend follow-up in 6 months with repeat CT scan.   Orders placed this encounter:  Orders Placed This Encounter  Procedures   CT Chest Wo Contrast   CBC with Differential/Platelet   Comprehensive metabolic panel   Lactate dehydrogenase   Ferritin   Iron and TIBC   Folate   Vitamin B12   Protein electrophoresis, serum   Kappa/lambda light chains       Derek Jack, MD [place] [phone]

## 2022-05-15 NOTE — Patient Instructions (Signed)
[place] at [facility] Discharge Instructions  You were seen and examined today by Dr. Delton Coombes.  Dr. Delton Coombes discussed your most recent lab work and CT scan which revealed everything is stable.  Follow-up as scheduled in 6 months.    Thank you for choosing [place] at [facility] to provide your oncology and hematology care.  To afford each patient quality time with our provider, please arrive at least 15 minutes before your scheduled appointment time.   If you have a lab appointment with the [facility] please come in thru the Main Entrance and check in at the main information desk.  You need to re-schedule your appointment should you arrive 10 or more minutes late.  We strive to give you quality time with our providers, and arriving late affects you and other patients whose appointments are after yours.  Also, if you no show three or more times for appointments you may be dismissed from the clinic at the providers discretion.     Again, thank you for choosing [facility].  Our hope is that these requests will decrease the amount of time that you wait before being seen by our physicians.       _____________________________________________________________  Should you have questions after your visit to [facility], please contact our office at [phone] and follow the prompts.  Our office hours are 8:00 a.m. and 4:30 p.m. Monday - Friday.  Please note that voicemails left after 4:00 p.m. may not be returned until the following business day.  We are closed weekends and major holidays.  You do have access to a nurse 24-7, just call the main number to the clinic [phone] and do not press any options, hold on the line and a nurse will answer the phone.    For prescription refill requests, have your pharmacy contact our office and allow 72 hours.

## 2022-05-23 DIAGNOSIS — N189 Chronic kidney disease, unspecified: Secondary | ICD-10-CM | POA: Diagnosis not present

## 2022-05-23 DIAGNOSIS — E1122 Type 2 diabetes mellitus with diabetic chronic kidney disease: Secondary | ICD-10-CM | POA: Diagnosis not present

## 2022-05-23 DIAGNOSIS — E875 Hyperkalemia: Secondary | ICD-10-CM | POA: Diagnosis not present

## 2022-05-23 DIAGNOSIS — D472 Monoclonal gammopathy: Secondary | ICD-10-CM | POA: Diagnosis not present

## 2022-05-23 DIAGNOSIS — R809 Proteinuria, unspecified: Secondary | ICD-10-CM | POA: Diagnosis not present

## 2022-05-23 DIAGNOSIS — I129 Hypertensive chronic kidney disease with stage 1 through stage 4 chronic kidney disease, or unspecified chronic kidney disease: Secondary | ICD-10-CM | POA: Diagnosis not present

## 2022-05-23 DIAGNOSIS — E1129 Type 2 diabetes mellitus with other diabetic kidney complication: Secondary | ICD-10-CM | POA: Diagnosis not present

## 2022-05-23 DIAGNOSIS — E211 Secondary hyperparathyroidism, not elsewhere classified: Secondary | ICD-10-CM | POA: Diagnosis not present

## 2022-05-23 DIAGNOSIS — D631 Anemia in chronic kidney disease: Secondary | ICD-10-CM | POA: Diagnosis not present

## 2022-06-10 DIAGNOSIS — B351 Tinea unguium: Secondary | ICD-10-CM | POA: Diagnosis not present

## 2022-06-10 DIAGNOSIS — M79674 Pain in right toe(s): Secondary | ICD-10-CM | POA: Diagnosis not present

## 2022-06-10 DIAGNOSIS — M79675 Pain in left toe(s): Secondary | ICD-10-CM | POA: Diagnosis not present

## 2022-07-11 DIAGNOSIS — I129 Hypertensive chronic kidney disease with stage 1 through stage 4 chronic kidney disease, or unspecified chronic kidney disease: Secondary | ICD-10-CM | POA: Diagnosis not present

## 2022-07-11 DIAGNOSIS — E875 Hyperkalemia: Secondary | ICD-10-CM | POA: Diagnosis not present

## 2022-07-11 DIAGNOSIS — D472 Monoclonal gammopathy: Secondary | ICD-10-CM | POA: Diagnosis not present

## 2022-07-11 DIAGNOSIS — E1122 Type 2 diabetes mellitus with diabetic chronic kidney disease: Secondary | ICD-10-CM | POA: Diagnosis not present

## 2022-07-11 DIAGNOSIS — N189 Chronic kidney disease, unspecified: Secondary | ICD-10-CM | POA: Diagnosis not present

## 2022-07-11 DIAGNOSIS — E211 Secondary hyperparathyroidism, not elsewhere classified: Secondary | ICD-10-CM | POA: Diagnosis not present

## 2022-07-11 DIAGNOSIS — D631 Anemia in chronic kidney disease: Secondary | ICD-10-CM | POA: Diagnosis not present

## 2022-07-11 DIAGNOSIS — R809 Proteinuria, unspecified: Secondary | ICD-10-CM | POA: Diagnosis not present

## 2022-07-11 DIAGNOSIS — E1129 Type 2 diabetes mellitus with other diabetic kidney complication: Secondary | ICD-10-CM | POA: Diagnosis not present

## 2022-07-17 DIAGNOSIS — D631 Anemia in chronic kidney disease: Secondary | ICD-10-CM | POA: Diagnosis not present

## 2022-07-17 DIAGNOSIS — E1129 Type 2 diabetes mellitus with other diabetic kidney complication: Secondary | ICD-10-CM | POA: Diagnosis not present

## 2022-07-17 DIAGNOSIS — D472 Monoclonal gammopathy: Secondary | ICD-10-CM | POA: Diagnosis not present

## 2022-07-17 DIAGNOSIS — N189 Chronic kidney disease, unspecified: Secondary | ICD-10-CM | POA: Diagnosis not present

## 2022-07-17 DIAGNOSIS — R809 Proteinuria, unspecified: Secondary | ICD-10-CM | POA: Diagnosis not present

## 2022-07-17 DIAGNOSIS — E1122 Type 2 diabetes mellitus with diabetic chronic kidney disease: Secondary | ICD-10-CM | POA: Diagnosis not present

## 2022-07-17 DIAGNOSIS — I129 Hypertensive chronic kidney disease with stage 1 through stage 4 chronic kidney disease, or unspecified chronic kidney disease: Secondary | ICD-10-CM | POA: Diagnosis not present

## 2022-07-29 DIAGNOSIS — E039 Hypothyroidism, unspecified: Secondary | ICD-10-CM | POA: Diagnosis not present

## 2022-07-29 DIAGNOSIS — E1122 Type 2 diabetes mellitus with diabetic chronic kidney disease: Secondary | ICD-10-CM | POA: Diagnosis not present

## 2022-07-29 DIAGNOSIS — Z125 Encounter for screening for malignant neoplasm of prostate: Secondary | ICD-10-CM | POA: Diagnosis not present

## 2022-08-06 DIAGNOSIS — E782 Mixed hyperlipidemia: Secondary | ICD-10-CM | POA: Diagnosis not present

## 2022-08-06 DIAGNOSIS — E559 Vitamin D deficiency, unspecified: Secondary | ICD-10-CM | POA: Diagnosis not present

## 2022-08-06 DIAGNOSIS — E1122 Type 2 diabetes mellitus with diabetic chronic kidney disease: Secondary | ICD-10-CM | POA: Diagnosis not present

## 2022-08-06 DIAGNOSIS — I129 Hypertensive chronic kidney disease with stage 1 through stage 4 chronic kidney disease, or unspecified chronic kidney disease: Secondary | ICD-10-CM | POA: Diagnosis not present

## 2022-08-06 DIAGNOSIS — R519 Headache, unspecified: Secondary | ICD-10-CM | POA: Diagnosis not present

## 2022-08-06 DIAGNOSIS — E039 Hypothyroidism, unspecified: Secondary | ICD-10-CM | POA: Diagnosis not present

## 2022-08-06 DIAGNOSIS — Z85118 Personal history of other malignant neoplasm of bronchus and lung: Secondary | ICD-10-CM | POA: Diagnosis not present

## 2022-08-06 DIAGNOSIS — N1832 Chronic kidney disease, stage 3b: Secondary | ICD-10-CM | POA: Diagnosis not present

## 2022-08-06 DIAGNOSIS — K219 Gastro-esophageal reflux disease without esophagitis: Secondary | ICD-10-CM | POA: Diagnosis not present

## 2022-08-06 DIAGNOSIS — Z23 Encounter for immunization: Secondary | ICD-10-CM | POA: Diagnosis not present

## 2022-08-06 DIAGNOSIS — M109 Gout, unspecified: Secondary | ICD-10-CM | POA: Diagnosis not present

## 2022-08-06 DIAGNOSIS — D472 Monoclonal gammopathy: Secondary | ICD-10-CM | POA: Diagnosis not present

## 2022-09-09 DIAGNOSIS — J019 Acute sinusitis, unspecified: Secondary | ICD-10-CM | POA: Diagnosis not present

## 2022-09-09 DIAGNOSIS — R059 Cough, unspecified: Secondary | ICD-10-CM | POA: Diagnosis not present

## 2022-10-03 DIAGNOSIS — H748X9 Other specified disorders of middle ear and mastoid, unspecified ear: Secondary | ICD-10-CM | POA: Diagnosis not present

## 2022-10-03 DIAGNOSIS — H748X2 Other specified disorders of left middle ear and mastoid: Secondary | ICD-10-CM | POA: Diagnosis not present

## 2022-11-13 ENCOUNTER — Ambulatory Visit (HOSPITAL_COMMUNITY)
Admission: RE | Admit: 2022-11-13 | Discharge: 2022-11-13 | Disposition: A | Discharge status: Home / Self Care | Payer: Medicare Other | Source: Ambulatory Visit | Attending: Hematology | Admitting: Hematology

## 2022-11-13 ENCOUNTER — Inpatient Hospital Stay: Payer: Medicare Other | Attending: Hematology

## 2022-11-13 DIAGNOSIS — Z79899 Other long term (current) drug therapy: Secondary | ICD-10-CM | POA: Diagnosis not present

## 2022-11-13 DIAGNOSIS — Z87891 Personal history of nicotine dependence: Secondary | ICD-10-CM | POA: Diagnosis not present

## 2022-11-13 DIAGNOSIS — C3492 Malignant neoplasm of unspecified part of left bronchus or lung: Secondary | ICD-10-CM | POA: Insufficient documentation

## 2022-11-13 DIAGNOSIS — C3412 Malignant neoplasm of upper lobe, left bronchus or lung: Secondary | ICD-10-CM | POA: Insufficient documentation

## 2022-11-13 DIAGNOSIS — D472 Monoclonal gammopathy: Secondary | ICD-10-CM | POA: Insufficient documentation

## 2022-11-13 LAB — IRON AND TIBC
Iron: 53 ug/dL (ref 45–182)
Saturation Ratios: 17 % — ABNORMAL LOW (ref 17.9–39.5)
TIBC: 308 ug/dL (ref 250–450)
UIBC: 255 ug/dL

## 2022-11-13 LAB — CBC WITH DIFFERENTIAL/PLATELET
Abs Immature Granulocytes: 0.02 10*3/uL (ref 0.00–0.07)
Basophils Absolute: 0.1 10*3/uL (ref 0.0–0.1)
Basophils Relative: 1 %
Eosinophils Absolute: 0.3 10*3/uL (ref 0.0–0.5)
Eosinophils Relative: 5 %
HCT: 37.4 % — ABNORMAL LOW (ref 39.0–52.0)
Hemoglobin: 12.1 g/dL — ABNORMAL LOW (ref 13.0–17.0)
Immature Granulocytes: 0 %
Lymphocytes Relative: 22 %
Lymphs Abs: 1.3 10*3/uL (ref 0.7–4.0)
MCH: 29.3 pg (ref 26.0–34.0)
MCHC: 32.4 g/dL (ref 30.0–36.0)
MCV: 90.6 fL (ref 80.0–100.0)
Monocytes Absolute: 0.6 10*3/uL (ref 0.1–1.0)
Monocytes Relative: 10 %
Neutro Abs: 3.7 10*3/uL (ref 1.7–7.7)
Neutrophils Relative %: 62 %
Platelets: 139 10*3/uL — ABNORMAL LOW (ref 150–400)
RBC: 4.13 MIL/uL — ABNORMAL LOW (ref 4.22–5.81)
RDW: 15.9 % — ABNORMAL HIGH (ref 11.5–15.5)
WBC: 5.9 10*3/uL (ref 4.0–10.5)
nRBC: 0 % (ref 0.0–0.2)

## 2022-11-13 LAB — VITAMIN B12: Vitamin B-12: 346 pg/mL (ref 180–914)

## 2022-11-13 LAB — COMPREHENSIVE METABOLIC PANEL
ALT: 20 U/L (ref 0–44)
AST: 18 U/L (ref 15–41)
Albumin: 4 g/dL (ref 3.5–5.0)
Alkaline Phosphatase: 43 U/L (ref 38–126)
Anion gap: 7 (ref 5–15)
BUN: 36 mg/dL — ABNORMAL HIGH (ref 8–23)
CO2: 26 mmol/L (ref 22–32)
Calcium: 9.4 mg/dL (ref 8.9–10.3)
Chloride: 104 mmol/L (ref 98–111)
Creatinine, Ser: 1.85 mg/dL — ABNORMAL HIGH (ref 0.61–1.24)
GFR, Estimated: 35 mL/min — ABNORMAL LOW (ref >60)
Glucose, Bld: 98 mg/dL (ref 70–99)
Potassium: 5.3 mmol/L — ABNORMAL HIGH (ref 3.5–5.1)
Sodium: 137 mmol/L (ref 135–145)
Total Bilirubin: 0.4 mg/dL (ref 0.3–1.2)
Total Protein: 7.8 g/dL (ref 6.5–8.1)

## 2022-11-13 LAB — FOLATE: Folate: 14.8 ng/mL (ref >5.9)

## 2022-11-13 LAB — LACTATE DEHYDROGENASE: LDH: 109 U/L (ref 98–192)

## 2022-11-13 LAB — FERRITIN: Ferritin: 66 ng/mL (ref 24–336)

## 2022-11-14 LAB — KAPPA/LAMBDA LIGHT CHAINS
Kappa free light chain: 65.9 mg/L — ABNORMAL HIGH (ref 3.3–19.4)
Kappa, lambda light chain ratio: 2.41 — ABNORMAL HIGH (ref 0.26–1.65)
Lambda free light chains: 27.4 mg/L — ABNORMAL HIGH (ref 5.7–26.3)

## 2022-11-17 LAB — PROTEIN ELECTROPHORESIS, SERUM
A/G Ratio: 1.1 (ref 0.7–1.7)
Albumin ELP: 3.6 g/dL (ref 2.9–4.4)
Alpha-1-Globulin: 0.2 g/dL (ref 0.0–0.4)
Alpha-2-Globulin: 0.8 g/dL (ref 0.4–1.0)
Beta Globulin: 0.8 g/dL (ref 0.7–1.3)
Gamma Globulin: 1.4 g/dL (ref 0.4–1.8)
Globulin, Total: 3.2 g/dL (ref 2.2–3.9)
M-Spike, %: 0.4 g/dL — ABNORMAL HIGH
Total Protein ELP: 6.8 g/dL (ref 6.0–8.5)

## 2022-11-20 ENCOUNTER — Inpatient Hospital Stay: Payer: Medicare Other | Admitting: Hematology

## 2022-11-20 VITALS — BP 130/65 | HR 68 | Temp 97.7°F | Resp 17 | Ht 68.0 in | Wt 162.6 lb

## 2022-11-20 DIAGNOSIS — D472 Monoclonal gammopathy: Secondary | ICD-10-CM | POA: Diagnosis not present

## 2022-11-20 DIAGNOSIS — C3492 Malignant neoplasm of unspecified part of left bronchus or lung: Secondary | ICD-10-CM | POA: Diagnosis not present

## 2022-11-20 NOTE — Patient Instructions (Addendum)
[place] at [facility] Discharge Instructions   You were seen and examined today by Dr. Delton Coombes.  He reviewed the results of your CT scan which was normal.   He reviewed the results of your lab work which are normal/stable. Your kidney function is elevated. Your iron levels are also low, and your body requires more iron because of your kidney dysfunction. Start taking iron tablets 325 mg (over the counter) daily.   We will repeat a CT scan, skeletal survey, and lab work in 6 months.   Return as scheduled.    Thank you for choosing [place] at [facility] to provide your oncology and hematology care.  To afford each patient quality time with our provider, please arrive at least 15 minutes before your scheduled appointment time.   If you have a lab appointment with the [facility] please come in thru the Main Entrance and check in at the main information desk.  You need to re-schedule your appointment should you arrive 10 or more minutes late.  We strive to give you quality time with our providers, and arriving late affects you and other patients whose appointments are after yours.  Also, if you no show three or more times for appointments you may be dismissed from the clinic at the providers discretion.     Again, thank you for choosing [facility].  Our hope is that these requests will decrease the amount of time that you wait before being seen by our physicians.       _____________________________________________________________  Should you have questions after your visit to [facility], please contact our office at [phone] and follow the prompts.  Our office hours are 8:00 a.m. and 4:30 p.m. Monday - Friday.  Please note that voicemails left after 4:00 p.m. may not be returned until the following business day.  We are closed weekends and major holidays.  You do have access to a nurse 24-7, just call the  main number to the clinic [phone] and do not press any options, hold on the line and a nurse will answer the phone.    For prescription refill requests, have your pharmacy contact our office and allow 72 hours.    Due to Covid, you will need to wear a mask upon entering the hospital. If you do not have a mask, a mask will be given to you at the Main Entrance upon arrival. For doctor visits, patients may have 1 support person age 42 or older with them. For treatment visits, patients can not have anyone with them due to social distancing guidelines and our immunocompromised population.

## 2022-11-20 NOTE — Progress Notes (Signed)
[facility] [address] [place] [address]    Clinic Day:  11/20/2022  Referring physician: Celene Squibb, MD  Patient Care Team: Celene Squibb, MD as PCP - General (Internal Medicine) Donetta Potts, RN as Oncology Nurse Navigator (Oncology) Derek Jack, MD as Medical Oncologist (Medical Oncology)   ASSESSMENT & PLAN:   Assessment: 1.  IgM kappa MGUS: -Serum immunofixation shows IgM kappa. -SPEP was negative.  Free kappa light chains of 48.5, lambda light chains 24.8, ratio of 1.96. -Beta-2 microglobulin 3.2.  LDH 105. -Creatinine 1.95 and calcium 9.7. -24-hour urine total protein was 572 mg.  M spike was negative. -Skeletal survey on 03/29/2020 did not show any lytic lesions. -PET scan on 04/09/2020 did not show any skeletal abnormalities.   2.  PT 1 CPN 0 left lung adenocarcinoma: -He quit smoking in 1999, smoked about 46 pack years. -PET scan on 04/09/2020 showed 2.1 x 2.1 cm left apical nodule with SUV 2.2.  Possibility of low-grade adenocarcinoma. -Navigational bronchoscopy and biopsy of the left upper lobe lung lesion on 05/29/2020 consistent with adenocarcinoma. -Left upper lobectomy and lymph node biopsy on 06/22/2020.  Pathology shows invasive adenocarcinoma, 3 cm, no visceral pleural invasion identified.  Resection margins negative.  0/4 hilar lymph nodes negative for carcinoma. - CT chest with contrast on 10/24/2020 shows postoperative changes with moderate sized loculated appearing left pleural effusion with no enhancing pleural nodules.  Stable small scattered mediastinal and hilar lymph nodes.  Plan: 1.  IgM kappa MGUS: - Denies any recurrent infections or new onset bone pains. - Labs from 11/13/2022 shows creatinine of 1.85.  Calcium is 9.4.  M spike is stable at 0.4 g.  Hemoglobin is 12.1.  Free light chain ratio is 2.41 with kappa light chain 65, both better than last time. - No "crab" features to consider further workup. - RTC 6 months  for follow-up with repeat labs.  Will also do skeletal survey at next visit.   2.  Left lung adenocarcinoma: - Denies any cough or new symptoms. - CT chest without contrast on 11/13/2022: No evidence of lung cancer recurrence. - Will schedule him for follow-up scan in 6 months.    Orders Placed This Encounter  Procedures   CT Chest Wo Contrast    Standing Status:   Future    Standing Expiration Date:   11/20/2023    Order Specific Question:   Preferred imaging location?    Answer:   [facility]   DG Bone Survey Met    Standing Status:   Future    Standing Expiration Date:   11/21/2023    Order Specific Question:   Reason for Exam (SYMPTOM  OR DIAGNOSIS REQUIRED)    Answer:   MGUS    Order Specific Question:   Preferred imaging location?    Answer:   [place] as a scribe for Derek Jack, MD.,have documented all relevant documentation on the behalf of Derek Jack, MD,as directed by  Derek Jack, MD while in the presence of Derek Jack, MD.   I, Derek Jack MD, have reviewed the above documentation for accuracy and completeness, and I agree with the above.   Doyce Loose   2/15/202411:56 AM  CHIEF COMPLAINT:   Diagnosis: left lung cancer and MGUS    Cancer Staging  Adenocarcinoma of left lung, stage 1 ([place]) Staging form: Lung, AJCC 8th Edition - Clinical stage  from 06/26/2020: Stage IA3 (cT1c, cN0, cM0) - Signed by Melrose Nakayama, MD on 06/26/2020    Prior Therapy: Left upper lung lobectomy on 06/22/2020   Current Therapy:  Surrveillance   HISTORY OF PRESENT ILLNESS:   Oncology History  Adenocarcinoma of left lung, stage 1 ([place])  06/26/2020 Initial Diagnosis   Adenocarcinoma of left lung, stage 1 ([place])   06/26/2020 Cancer Staging   Staging form: Lung, AJCC 8th Edition - Clinical stage from 06/26/2020: Stage IA3 (cT1c, cN0, cM0) - Signed by Melrose Nakayama, MD on  06/26/2020      INTERVAL HISTORY:   [place]i is a 89 y.o. male presenting to clinic today for follow up of left lung cancer and MGUS . He was last seen by me on 05/25/2022.  Today, he states that he is doing well overall. His appetite level is at 100%. His energy level is at 75%. He has 5/10 knee and right arm pain. It has been 2 1/12 year since his surgery. He denies any breathing problems, ankle swelling, and  recent infections. He states he has arm pain due to falling off of his porch. He has iron pills but does not take them.    PAST MEDICAL HISTORY:   Past Medical History: Past Medical History:  Diagnosis Date   Acid reflux disease    Arthritis    Complication of anesthesia    appendix surgery (89 year old) - passed out once patient got back to floor unit   Diabetes mellitus    Hiatal hernia    High cholesterol    Hypertension    Hypothyroidism    Monoclonal gammopathy of unknown significance (MGUS) 03/28/2020   Normocytic anemia 03/28/2020   Renal disorder    renal insufficiency   Type 2 diabetes mellitus ([place])     Surgical History: Past Surgical History:  Procedure Laterality Date   APPENDECTOMY     BRONCHIAL BIOPSY  05/29/2020   Procedure: BRONCHIAL BIOPSIES;  Surgeon: Collene Gobble, MD;  Location: [facility] ENDOSCOPY;  Service: Pulmonary;;   BRONCHIAL BRUSHINGS  05/29/2020   Procedure: BRONCHIAL BRUSHINGS;  Surgeon: Collene Gobble, MD;  Location: [facility] ENDOSCOPY;  Service: Pulmonary;;   BRONCHIAL NEEDLE ASPIRATION BIOPSY  05/29/2020   Procedure: BRONCHIAL NEEDLE ASPIRATION BIOPSIES;  Surgeon: Collene Gobble, MD;  Location: [facility] ENDOSCOPY;  Service: Pulmonary;;   BRONCHIAL WASHINGS  05/29/2020   Procedure: BRONCHIAL WASHINGS;  Surgeon: Collene Gobble, MD;  Location: [facility] ENDOSCOPY;  Service: Pulmonary;;   EYE SURGERY     INTERCOSTAL NERVE BLOCK Left 06/22/2020   Procedure: INTERCOSTAL NERVE BLOCK;  Surgeon: Melrose Nakayama, MD;  Location: [facility];  Service: Thoracic;   Laterality: Left;   NODE DISSECTION Left 06/22/2020   Procedure: NODE DISSECTION;  Surgeon: Melrose Nakayama, MD;  Location: [facility];  Service: Thoracic;  Laterality: Left;   TONSILLECTOMY     VIDEO BRONCHOSCOPY WITH ENDOBRONCHIAL NAVIGATION N/A 05/29/2020   Procedure: VIDEO BRONCHOSCOPY WITH ENDOBRONCHIAL NAVIGATION;  Surgeon: Collene Gobble, MD;  Location: [facility] ENDOSCOPY;  Service: Pulmonary;  Laterality: N/A;    Social History: Social History   Socioeconomic History   Marital status: Married    Spouse name: Not on file   Number of children: 4   Years of education: Not on file   Highest education level: Not on file  Occupational History   Occupation: RETIRED  Tobacco Use   Smoking status: Former    Packs/day: 0.50    Years: 47.00  Total pack years: 23.50    Types: Cigarettes    Quit date: 2000    Years since quitting: 24.1   Smokeless tobacco: Never  Vaping Use   Vaping Use: Never used  Substance and Sexual Activity   Alcohol use: No   Drug use: No   Sexual activity: Not Currently  Other Topics Concern   Not on file  Social History Narrative   Not on file   Social Determinants of Health   Financial Resource Strain: Low Risk  (10/25/2020)   Overall Financial Resource Strain (CARDIA)    Difficulty of Paying Living Expenses: Not hard at all  Food Insecurity: No Food Insecurity (10/25/2020)   Hunger Vital Sign    Worried About Running Out of Food in the Last Year: Never true    Ran Out of Food in the Last Year: Never true  Transportation Needs: No Transportation Needs (10/25/2020)   PRAPARE - Hydrologist (Medical): No    Lack of Transportation (Non-Medical): No  Physical Activity: Sufficiently Active (10/25/2020)   Exercise Vital Sign    Days of Exercise per Week: 7 days    Minutes of Exercise per Session: 30 min  Stress: No Stress Concern Present (10/25/2020)   [place]    Feeling of Stress : Only a little  Social Connections: Moderately Integrated (10/25/2020)   Social Connection and Isolation Panel [NHANES]    Frequency of Communication with Friends and Family: More than three times a week    Frequency of Social Gatherings with Friends and Family: More than three times a week    Attends Religious Services: More than 4 times per year    Active Member of [employer] or Organizations: No    Attends Archivist Meetings: Not on file    Marital Status: Married  Human resources officer Violence: Not At Risk (10/25/2020)   Humiliation, Afraid, Rape, and Kick questionnaire    Fear of Current or Ex-Partner: No    Emotionally Abused: No    Physically Abused: No    Sexually Abused: No    Family History: Family History  Problem Relation Age of Onset   Diabetes Mother    Cancer Father    Cancer Sister    Diabetes Brother    Cancer Brother    Diabetes Brother    Parkinson's disease Brother    Diabetes Brother     Current Medications:  Current Outpatient Medications:    acetaminophen (TYLENOL) 500 MG tablet, Take 2 tablets (1,000 mg total) by mouth every 6 (six) hours., Disp: 30 tablet, Rfl: 0   Cholecalciferol 50 MCG (2000 UT) TABS, Take 2,000 Units by mouth daily. , Disp: , Rfl:    docusate sodium (COLACE) 100 MG capsule, Take 1 capsule (100 mg total) by mouth 2 (two) times daily as needed for mild constipation., Disp: 10 capsule, Rfl: 0   gabapentin (NEURONTIN) 300 MG capsule, Take 300 mg by mouth 3 (three) times daily., Disp: , Rfl:    glipiZIDE (GLUCOTROL XL) 2.5 MG 24 hr tablet, Take 2.5 mg by mouth daily as needed (blood sugar above 160). , Disp: , Rfl:    Lancets (ONETOUCH DELICA PLUS [id number]) MISC, 2 (two) times daily., Disp: , Rfl:    levothyroxine (SYNTHROID) 75 MCG tablet, Take 75 mcg by mouth daily., Disp: , Rfl:    LOKELMA 5 g packet, Take by mouth., Disp: , Rfl:    methylPREDNISolone (MEDROL  DOSEPAK) 4 MG TBPK tablet, Take as  directed, Disp: 21 tablet, Rfl: 0   omeprazole (PRILOSEC) 20 MG capsule, Take 20 mg by mouth daily., Disp: , Rfl:    ONETOUCH ULTRA test strip, 2 (two) times daily., Disp: , Rfl:    oxyCODONE-acetaminophen (PERCOCET) 5-325 MG tablet, Take 1 tablet by mouth every 6 (six) hours as needed., Disp: 10 tablet, Rfl: 0   rosuvastatin (CRESTOR) 40 MG tablet, Take 40 mg by mouth daily. , Disp: , Rfl:    amLODipine (NORVASC) 10 MG tablet, Take 10 mg by mouth daily. , Disp: , Rfl:    Allergies: No Known Allergies  REVIEW OF SYSTEMS:   Review of Systems  Constitutional:  Negative for chills, fatigue and fever.  HENT:   Negative for lump/mass, mouth sores, nosebleeds, sore throat and trouble swallowing.   Eyes:  Negative for eye problems.  Respiratory:  Negative for cough and shortness of breath.   Cardiovascular:  Negative for chest pain, leg swelling and palpitations.  Gastrointestinal:  Positive for constipation. Negative for abdominal pain, diarrhea, nausea and vomiting.  Genitourinary:  Negative for bladder incontinence, difficulty urinating, dysuria, frequency, hematuria and nocturia.   Musculoskeletal:  Negative for arthralgias, back pain, flank pain, myalgias and neck pain.  Skin:  Negative for itching and rash.  Neurological:  Positive for headaches and numbness (Tingling right head). Negative for dizziness.  Hematological:  Does not bruise/bleed easily.  Psychiatric/Behavioral:  Negative for depression, sleep disturbance and suicidal ideas. The patient is not nervous/anxious.   All other systems reviewed and are negative.    VITALS:   Blood pressure 130/65, pulse 68, temperature 97.7 F (36.5 C), temperature source Oral, resp. rate 17, height 5\' 8"  (1.727 m), weight 162 lb 9.6 oz (73.8 kg), SpO2 98 %.  Wt Readings from Last 3 Encounters:  11/20/22 162 lb 9.6 oz (73.8 kg)  05/15/22 154 lb 8 oz (70.1 kg)  09/08/21 151 lb (68.5 kg)    Body mass index is 24.72 kg/m.  Performance  status (ECOG): 1 - Symptomatic but completely ambulatory  PHYSICAL EXAM:   Physical Exam Vitals and nursing note reviewed. Exam conducted with a chaperone present.  Constitutional:      Appearance: Normal appearance.  Cardiovascular:     Rate and Rhythm: Normal rate and regular rhythm.     Pulses: Normal pulses.     Heart sounds: Normal heart sounds.  Pulmonary:     Effort: Pulmonary effort is normal.     Breath sounds: Normal breath sounds.  Abdominal:     Palpations: Abdomen is soft. There is no hepatomegaly, splenomegaly or mass.     Tenderness: There is no abdominal tenderness.  Musculoskeletal:     Right lower leg: No edema.     Left lower leg: No edema.  Lymphadenopathy:     Cervical: No cervical adenopathy.     Right cervical: No superficial, deep or posterior cervical adenopathy.    Left cervical: No superficial, deep or posterior cervical adenopathy.     Upper Body:     Right upper body: No supraclavicular or axillary adenopathy.     Left upper body: No supraclavicular or axillary adenopathy.  Neurological:     General: No focal deficit present.     Mental Status: He is alert and oriented to person, place, and time.  Psychiatric:        Mood and Affect: Mood normal.        Behavior: Behavior normal.  LABS:      Latest Ref Rng & Units 11/13/2022   10:36 AM 05/09/2022    2:42 PM 09/08/2021    4:29 PM  CBC  WBC 4.0 - 10.5 K/uL 5.9  6.6  10.2   Hemoglobin 13.0 - 17.0 g/dL 12.1  11.6  12.3   Hematocrit 39.0 - 52.0 % 37.4  35.0  37.5   Platelets 150 - 400 K/uL 139  153  139       Latest Ref Rng & Units 11/13/2022   10:36 AM 05/09/2022    2:42 PM 09/08/2021    4:29 PM  CMP  Glucose 70 - 99 mg/dL 98  111  169   BUN 8 - 23 mg/dL 36  34  30   Creatinine 0.61 - 1.24 mg/dL 1.85  1.85  1.73   Sodium 135 - 145 mmol/L 137  139  135   Potassium 3.5 - 5.1 mmol/L 5.3  4.8  3.9   Chloride 98 - 111 mmol/L 104  106  104   CO2 22 - 32 mmol/L 26  27  24    Calcium 8.9 - 10.3  mg/dL 9.4  9.2  9.6   Total Protein 6.5 - 8.1 g/dL 7.8  7.5    Total Bilirubin 0.3 - 1.2 mg/dL 0.4  0.7    Alkaline Phos 38 - 126 U/L 43  45    AST 15 - 41 U/L 18  18    ALT 0 - 44 U/L 20  26       No results found for: "CEA1", "CEA" / No results found for: "CEA1", "CEA" No results found for: "PSA1" No results found for: "CAN199" No results found for: "CAN125"  Lab Results  Component Value Date   TOTALPROTELP 6.8 11/13/2022   ALBUMINELP 3.6 11/13/2022   A1GS 0.2 11/13/2022   A2GS 0.8 11/13/2022   BETS 0.8 11/13/2022   GAMS 1.4 11/13/2022   MSPIKE 0.4 (H) 11/13/2022   SPEI Comment 11/13/2022   Lab Results  Component Value Date   TIBC 308 11/13/2022   FERRITIN 66 11/13/2022   IRONPCTSAT 17 (L) 11/13/2022   Lab Results  Component Value Date   LDH 109 11/13/2022   LDH 105 03/28/2020     STUDIES:   CT Chest Wo Contrast  Result Date: 11/13/2022 CLINICAL DATA:  Non-small cell lung cancer and MGUS. Post LEFT upper lobectomy 2021. * Tracking Code: BO * EXAM: CT CHEST WITHOUT CONTRAST TECHNIQUE: Multidetector CT imaging of the chest was performed following the standard protocol without IV contrast. RADIATION DOSE REDUCTION: This exam was performed according to the departmental dose-optimization program which includes automated exposure control, adjustment of the mA and/or kV according to patient size and/or use of iterative reconstruction technique. COMPARISON:  05/09/2022 FINDINGS: Cardiovascular: Truncation of the LEFT main pulmonary artery related to LEFT upper lobectomy. Coronary artery calcification and aortic atherosclerotic calcification. No acute cardiac findings. Mediastinum/Nodes: No axillary or supraclavicular adenopathy. No mediastinal or hilar adenopathy. No pericardial fluid. Esophagus normal. Lungs/Pleura: Volume loss in the LEFT hemithorax related to LEFT upper lobectomy. No suspicious nodularity. Bullous change in the RIGHT upper lobe. No suspicious RIGHT lung pulmonary  nodules. Upper Abdomen: Limited view of the liver, kidneys, pancreas are unremarkable. Normal adrenal glands. Musculoskeletal: No aggressive osseous lesion IMPRESSION: 1. No evidence of lung cancer recurrence. 2. Post LEFT upper lobectomy. 3.  Aortic Atherosclerosis (ICD10-I70.0). Electronically Signed   By: Suzy Bouchard M.D.   On: 11/13/2022 13:52

## 2023-01-08 ENCOUNTER — Ambulatory Visit

## 2023-01-20 NOTE — H&P (Signed)
Surgical History & Physical  Patient Name: William Wells DOB: 02-09-1934  Surgery: Cataract extraction with intraocular lens implant phacoemulsification; Left Eye  Surgeon: Fabio Pierce MD Surgery Date:  01-23-23 Pre-Op Date:  01-05-23  HPI: A 73 Yr. old male patient 1. 1. The patient complains of difficulty when driving at night due to glare and during bright sunny days, which began 1 year ago. Both eyes are affected. The episode is constant. The condition's severity is worsening. This is negatively affecting the patient's quality of life and the patient is unable to function adequately in life with the current level of vision. Patient denies diplopia. Left eye is worse than right. HPI was performed by Fabio Pierce .  Medical History: Macula Degeneration Glaucoma Cataracts Arthritis Cancer Diabetes End stage renal failure, acid reflux High Blood Pressure Thyroid Problems Ulcers  Review of Systems Cardiovascular High Blood Pressure Musculoskeletal arthritis aches All recorded systems are negative except as noted above.  Social   Former smoker /  Cigarettes   Medication Amlodipine, Gabapentin, Glipizide, Levothyroxine, Omeprazole, Rosuvastatin, Tamsulosin,   Sx/Procedures Strabismus Sx,  Lung sx, Appendectomy, Jaw sx,   Drug Allergies   NKDA  History & Physical: Heent: cataract, left eye NECK: supple without bruits LUNGS: lungs clear to auscultation CV: regular rate and rhythm Abdomen: soft and non-tender Impression & Plan: Assessment: 1.  COMBINED FORMS AGE RELATED CATARACT; Left Eye (H25.812) 2.  NUCLEAR SCLEROSIS AGE RELATED; Right Eye (H25.11) 3.  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  OAG BORDERLINE FINDINGS LOW RISK; Both Eyes (H40.013) 5.  BLEPHARITIS; Right Upper Lid, Right Lower Lid, Left Upper Lid, Left Lower Lid (H01.001, H01.002,H01.004,H01.005) 6.  DERMATOCHALASIS, no surgery; Right Upper Lid, Left Upper Lid (H02.831, H02.834) 7.   ASTIGMATISM, REGULAR; Both Eyes (H52.223)  Plan: 1.  Cataract accounts for the patient's decreased vision. This visual impairment is not correctable with a tolerable change in glasses or contact lenses. Cataract surgery with an implantation of a new lens should significantly improve the visual and functional status of the patient. Discussed all risks, benefits, alternatives, and potential complications. Discussed the procedures and recovery. Patient desires to have surgery. A-scan ordered and performed today for intra-ocular lens calculations. The surgery will be performed in order to improve vision for driving, reading, and for eye examinations. Recommend phacoemulsification with intra-ocular lens. Recommend Dextenza for post-operative pain and inflammation. Left Eye. Dilates poorly - shugarcaine by protocol. Malyugin Ring. Omidira. Toric Lens. - eyehance.  2.  Will address after left eye.  3.  OCT macula shows outer retinal changes. AREDS 2 vitamins. Amsler grid testing weekly. Call with any worsening vision, pain, or any other concerns.  4.  Based on cup-to-disc ratio. Negative Family history. Normal corneal thickness. OCT rNFL shows: Borderline OD, thinning OS 01/05/23 Detailed discussion about glaucoma today including importance of maintaining good follow up and following treatment plan, and the possibility of irreversible blindness as part of this disease process.  5.  Recommend regular lid cleaning. Warm compresses 7-10 minutes every day, both eyes.  6.  Asymptomatic, recommend observation for now. Findings, prognosis and treatment options reviewed.  7.  Recommend toric IOL OU.

## 2023-01-21 ENCOUNTER — Other Ambulatory Visit: Payer: Self-pay

## 2023-01-21 ENCOUNTER — Encounter (HOSPITAL_COMMUNITY): Payer: Self-pay

## 2023-01-21 ENCOUNTER — Encounter (HOSPITAL_COMMUNITY)
Admission: RE | Admit: 2023-01-21 | Discharge: 2023-01-21 | Disposition: A | Discharge status: Home / Self Care | Payer: Medicare Other | Source: Ambulatory Visit | Attending: Ophthalmology | Admitting: Ophthalmology

## 2023-01-23 ENCOUNTER — Ambulatory Visit (HOSPITAL_COMMUNITY): Payer: Medicare Other | Admitting: Anesthesiology

## 2023-01-23 ENCOUNTER — Ambulatory Visit (HOSPITAL_COMMUNITY)
Admission: RE | Admit: 2023-01-23 | Discharge: 2023-01-23 | Disposition: A | Discharge status: Home / Self Care | Payer: Medicare Other | Attending: Ophthalmology | Admitting: Ophthalmology

## 2023-01-23 ENCOUNTER — Encounter (HOSPITAL_COMMUNITY)
Admission: RE | Disposition: A | Discharge status: Home / Self Care | Payer: Self-pay | Source: Home / Self Care | Attending: Ophthalmology

## 2023-01-23 ENCOUNTER — Encounter (HOSPITAL_COMMUNITY): Payer: Self-pay | Admitting: Ophthalmology

## 2023-01-23 ENCOUNTER — Ambulatory Visit (HOSPITAL_BASED_OUTPATIENT_CLINIC_OR_DEPARTMENT_OTHER): Payer: Medicare Other | Admitting: Anesthesiology

## 2023-01-23 ENCOUNTER — Other Ambulatory Visit: Payer: Self-pay

## 2023-01-23 DIAGNOSIS — Z87891 Personal history of nicotine dependence: Secondary | ICD-10-CM | POA: Diagnosis not present

## 2023-01-23 DIAGNOSIS — I10 Essential (primary) hypertension: Secondary | ICD-10-CM

## 2023-01-23 DIAGNOSIS — H25812 Combined forms of age-related cataract, left eye: Secondary | ICD-10-CM | POA: Diagnosis not present

## 2023-01-23 DIAGNOSIS — Z09 Encounter for follow-up examination after completed treatment for conditions other than malignant neoplasm: Secondary | ICD-10-CM | POA: Insufficient documentation

## 2023-01-23 DIAGNOSIS — E1122 Type 2 diabetes mellitus with diabetic chronic kidney disease: Secondary | ICD-10-CM | POA: Insufficient documentation

## 2023-01-23 DIAGNOSIS — Z7984 Long term (current) use of oral hypoglycemic drugs: Secondary | ICD-10-CM | POA: Diagnosis not present

## 2023-01-23 DIAGNOSIS — Z79899 Other long term (current) drug therapy: Secondary | ICD-10-CM | POA: Diagnosis not present

## 2023-01-23 DIAGNOSIS — E119 Type 2 diabetes mellitus without complications: Secondary | ICD-10-CM

## 2023-01-23 DIAGNOSIS — E1136 Type 2 diabetes mellitus with diabetic cataract: Secondary | ICD-10-CM | POA: Insufficient documentation

## 2023-01-23 DIAGNOSIS — N186 End stage renal disease: Secondary | ICD-10-CM | POA: Insufficient documentation

## 2023-01-23 DIAGNOSIS — I129 Hypertensive chronic kidney disease with stage 1 through stage 4 chronic kidney disease, or unspecified chronic kidney disease: Secondary | ICD-10-CM | POA: Insufficient documentation

## 2023-01-23 DIAGNOSIS — K219 Gastro-esophageal reflux disease without esophagitis: Secondary | ICD-10-CM | POA: Diagnosis not present

## 2023-01-23 DIAGNOSIS — E039 Hypothyroidism, unspecified: Secondary | ICD-10-CM | POA: Insufficient documentation

## 2023-01-23 HISTORY — PX: CATARACT EXTRACTION W/PHACO: SHX586

## 2023-01-23 LAB — GLUCOSE, CAPILLARY
Glucose-Capillary: 83 mg/dL (ref 70–99)
Glucose-Capillary: 147 mg/dL — ABNORMAL HIGH (ref 70–99)

## 2023-01-23 SURGERY — PHACOEMULSIFICATION, CATARACT, WITH IOL INSERTION
Anesthesia: Monitor Anesthesia Care | Site: Eye | Laterality: Left

## 2023-01-23 MED ORDER — POVIDONE-IODINE 5 % OP SOLN
OPHTHALMIC | Status: DC | PRN
  Administered 2023-01-23: 1 via OPHTHALMIC

## 2023-01-23 MED ORDER — PHENYLEPHRINE HCL 2.5 % OP SOLN
1.0000 [drp] | OPHTHALMIC | Status: AC | PRN
  Administered 2023-01-23 (×3): 1 [drp] via OPHTHALMIC

## 2023-01-23 MED ORDER — DEXTROSE 50 % IV SOLN: 25.0000 mL | Freq: Once | INTRAVENOUS | Status: AC

## 2023-01-23 MED ORDER — MIDAZOLAM HCL 5 MG/5ML IJ SOLN
INTRAMUSCULAR | Status: DC | PRN
  Administered 2023-01-23: .5 mg via INTRAVENOUS

## 2023-01-23 MED ORDER — BSS IO SOLN
INTRAOCULAR | Status: DC | PRN
  Administered 2023-01-23: 15 mL via INTRAOCULAR

## 2023-01-23 MED ORDER — SODIUM HYALURONATE 10 MG/ML IO SOLUTION
PREFILLED_SYRINGE | INTRAOCULAR | Status: DC | PRN
  Administered 2023-01-23: .85 mL via INTRAOCULAR

## 2023-01-23 MED ORDER — STERILE WATER FOR IRRIGATION IR SOLN
Status: DC | PRN
  Administered 2023-01-23: 250 mL/h

## 2023-01-23 MED ORDER — NEOMYCIN-POLYMYXIN-DEXAMETH 3.5-10000-0.1 OP SUSP
OPHTHALMIC | Status: DC | PRN
  Administered 2023-01-23: 2 [drp] via OPHTHALMIC

## 2023-01-23 MED ORDER — MIDAZOLAM HCL 2 MG/2ML IJ SOLN
INTRAMUSCULAR | Status: AC
  Filled 2023-01-23: qty 2

## 2023-01-23 MED ORDER — TETRACAINE HCL 0.5 % OP SOLN
1.0000 [drp] | OPHTHALMIC | Status: AC | PRN
  Administered 2023-01-23 (×3): 1 [drp] via OPHTHALMIC

## 2023-01-23 MED ORDER — TROPICAMIDE 1 % OP SOLN
1.0000 [drp] | OPHTHALMIC | Status: AC | PRN
  Administered 2023-01-23 (×3): 1 [drp] via OPHTHALMIC

## 2023-01-23 MED ORDER — LIDOCAINE HCL 3.5 % OP GEL
1.0000 | Freq: Once | OPHTHALMIC | Status: AC
  Administered 2023-01-23: 1 via OPHTHALMIC

## 2023-01-23 MED ORDER — LIDOCAINE HCL (PF) 1 % IJ SOLN
INTRAOCULAR | Status: DC | PRN
  Administered 2023-01-23: 1 mL via OPHTHALMIC

## 2023-01-23 MED ORDER — SODIUM HYALURONATE 23MG/ML IO SOSY
PREFILLED_SYRINGE | INTRAOCULAR | Status: DC | PRN
  Administered 2023-01-23: .6 mL via INTRAOCULAR

## 2023-01-23 MED ORDER — DEXTROSE 50 % IV SOLN
INTRAVENOUS | Status: AC
  Administered 2023-01-23: 25 mL via INTRAVENOUS
  Filled 2023-01-23: qty 50

## 2023-01-23 MED ORDER — SODIUM CHLORIDE 0.9% FLUSH
INTRAVENOUS | Status: DC | PRN
  Administered 2023-01-23: 3 mL via INTRAVENOUS

## 2023-01-23 MED ORDER — PHENYLEPHRINE-KETOROLAC 1-0.3 % IO SOLN
INTRAOCULAR | Status: DC | PRN
  Administered 2023-01-23: 500 mL via OPHTHALMIC

## 2023-01-23 MED ORDER — PHENYLEPHRINE-KETOROLAC 1-0.3 % IO SOLN
INTRAOCULAR | Status: AC
  Filled 2023-01-23: qty 4

## 2023-01-23 SURGICAL SUPPLY — 14 items
CATARACT SUITE SIGHTPATH (MISCELLANEOUS) ×1 IMPLANT
CLOTH BEACON ORANGE TIMEOUT ST (SAFETY) ×1 IMPLANT
EYE SHIELD UNIVERSAL CLEAR (GAUZE/BANDAGES/DRESSINGS) IMPLANT
FEE CATARACT SUITE SIGHTPATH (MISCELLANEOUS) ×1 IMPLANT
GLOVE BIOGEL PI IND STRL 7.0 (GLOVE) ×2 IMPLANT
LENS IOL RAYNER 23.0 (Intraocular Lens) ×1 IMPLANT
LENS IOL RAYONE EMV 23.0 (Intraocular Lens) IMPLANT
NDL HYPO 18GX1.5 BLUNT FILL (NEEDLE) ×1 IMPLANT
NEEDLE HYPO 18GX1.5 BLUNT FILL (NEEDLE) ×1 IMPLANT
PAD ARMBOARD 7.5X6 YLW CONV (MISCELLANEOUS) ×1 IMPLANT
RING MALYGIN 7.0 (MISCELLANEOUS) IMPLANT
SYR TB 1ML LL NO SAFETY (SYRINGE) ×1 IMPLANT
TAPE SURG TRANSPORE 1 IN (GAUZE/BANDAGES/DRESSINGS) IMPLANT
WATER STERILE IRR 250ML POUR (IV SOLUTION) ×1 IMPLANT

## 2023-01-23 NOTE — Transfer of Care (Signed)
Immediate Anesthesia Transfer of Care Note  Patient: William Wells  Procedure(s) Performed: CATARACT EXTRACTION PHACO AND INTRAOCULAR LENS PLACEMENT (IOC) (Left: Eye)  Patient Location: Short Stay  Anesthesia Type:MAC  Level of Consciousness: awake, alert , and oriented  Airway & Oxygen Therapy: Patient Spontanous Breathing  Post-op Assessment: Report given to RN, Post -op Vital signs reviewed and stable, Patient moving all extremities X 4, and Patient able to stick tongue midline  Post vital signs: Reviewed  Last Vitals:  Vitals Value Taken Time  BP 130/63   Temp 97.3   Pulse 59   Resp 15   SpO2 100     Last Pain:  Vitals:   01/23/23 1001  TempSrc: Oral  PainSc: 0-No pain         Complications: No notable events documented.

## 2023-01-23 NOTE — Anesthesia Postprocedure Evaluation (Signed)
Anesthesia Post Note  Patient: William Wells  Procedure(s) Performed: CATARACT EXTRACTION PHACO AND INTRAOCULAR LENS PLACEMENT (IOC) (Left: Eye)  Patient location during evaluation: Phase II Anesthesia Type: MAC Level of consciousness: awake and alert and oriented Pain management: pain level controlled Vital Signs Assessment: post-procedure vital signs reviewed and stable Respiratory status: spontaneous breathing, nonlabored ventilation and respiratory function stable Cardiovascular status: stable and blood pressure returned to baseline Postop Assessment: no apparent nausea or vomiting Anesthetic complications: no  No notable events documented.   Last Vitals:  Vitals:   01/23/23 1001 01/23/23 1100  BP: 133/74 130/63  Pulse: (!) 58   Resp: 16 20  Temp: 36.8 C (!) 36.3 C  SpO2: 97% 100%    Last Pain:  Vitals:   01/23/23 1105  TempSrc:   PainSc: 0-No pain                 Amayiah Gosnell C Calix Heinbaugh

## 2023-01-23 NOTE — Interval H&P Note (Signed)
History and Physical Interval Note:  01/23/2023 10:38 AM  William Wells  has presented today for surgery, with the diagnosis of combined forms age related cataract; left.  The various methods of treatment have been discussed with the patient and family. After consideration of risks, benefits and other options for treatment, the patient has consented to  Procedure(s): CATARACT EXTRACTION PHACO AND INTRAOCULAR LENS PLACEMENT (IOC) (Left) as a surgical intervention.  The patient's history has been reviewed, patient examined, no change in status, stable for surgery.  I have reviewed the patient's chart and labs.  Questions were answered to the patient's satisfaction.     Fabio Pierce

## 2023-01-23 NOTE — Anesthesia Preprocedure Evaluation (Signed)
Anesthesia Evaluation  Patient identified by MRN, date of birth, ID band Patient awake    Reviewed: Allergy & Precautions, H&P , NPO status , Patient's Chart, lab work & pertinent test results  History of Anesthesia Complications (+) history of anesthetic complications  Airway Mallampati: II  TM Distance: >3 FB Neck ROM: Full    Dental no notable dental hx. (+) Edentulous Upper, Edentulous Lower   Pulmonary former smoker   Pulmonary exam normal breath sounds clear to auscultation       Cardiovascular Exercise Tolerance: Good hypertension, Pt. on medications Normal cardiovascular exam Rhythm:Regular Rate:Normal     Neuro/Psych  Neuromuscular disease  negative psych ROS   GI/Hepatic Neg liver ROS, hiatal hernia,GERD  Medicated and Controlled,,  Endo/Other  diabetes, Well Controlled, Type 2, Oral Hypoglycemic AgentsHypothyroidism    Renal/GU Renal InsufficiencyRenal disease  negative genitourinary   Musculoskeletal  (+) Arthritis ,    Abdominal   Peds negative pediatric ROS (+)  Hematology  (+) Blood dyscrasia, anemia   Anesthesia Other Findings   Reproductive/Obstetrics negative OB ROS                             Anesthesia Physical Anesthesia Plan  ASA: 2  Anesthesia Plan: MAC   Post-op Pain Management: Minimal or no pain anticipated   Induction:   PONV Risk Score and Plan: Treatment may vary due to age or medical condition  Airway Management Planned: Nasal Cannula and Natural Airway  Additional Equipment:   Intra-op Plan:   Post-operative Plan:   Informed Consent: I have reviewed the patients History and Physical, chart, labs and discussed the procedure including the risks, benefits and alternatives for the proposed anesthesia with the patient or authorized representative who has indicated his/her understanding and acceptance.       Plan Discussed with: CRNA and  Surgeon  Anesthesia Plan Comments:         Anesthesia Quick Evaluation

## 2023-01-23 NOTE — Op Note (Signed)
Date of procedure: 01/23/23  Pre-operative diagnosis: Visually significant age-related combined cataract, Left Eye (H25.812)  Post-operative diagnosis: Visually significant age-related combined cataract, Left Eye (H25.812)  Procedure: Removal of cataract via phacoemulsification and insertion of intra-ocular lens Rayner [id number] +23.0D into the capsular bag of the Left Eye  Attending surgeon: Rudy Jew. Erinn Mendosa, MD, MA  Anesthesia: MAC, Topical Akten  Complications: None  Estimated Blood Loss: <44mL (minimal)  Specimens: None  Implants: As above  Indications:  Visually significant age-related cataract, Left Eye  Procedure:  The patient was seen and identified in the pre-operative area. The operative eye was identified and dilated.  The operative eye was marked.  Topical anesthesia was administered to the operative eye.     The patient was then to the operative suite and placed in the supine position.  A timeout was performed confirming the patient, procedure to be performed, and all other relevant information.   The patient's face was prepped and draped in the usual fashion for intra-ocular surgery.  A lid speculum was placed into the operative eye and the surgical microscope moved into place and focused.  An inferotemporal paracentesis was created using a 20 gauge paracentesis blade.  Shugarcaine was injected into the anterior chamber.  Viscoelastic was injected into the anterior chamber.  A temporal clear-corneal main wound incision was created using a 2.101mm microkeratome.  A continuous curvilinear capsulorrhexis was initiated using an irrigating cystitome and completed using capsulorrhexis forceps.  Hydrodissection and hydrodeliniation were performed.  Viscoelastic was injected into the anterior chamber.  A phacoemulsification handpiece and a chopper as a second instrument were used to remove the nucleus and epinucleus. The irrigation/aspiration handpiece was used to remove any remaining  cortical material.   The capsular bag was reinflated with viscoelastic, checked, and found to be intact.  The intraocular lens was inserted into the capsular bag.  The irrigation/aspiration handpiece was used to remove any remaining viscoelastic.  The clear corneal wound and paracentesis wounds were then hydrated and checked with Weck-Cels to be watertight. Maxitrol drops were instilled into the operative eye. The lid-speculum was removed.  The drape was removed.  The patient's face was cleaned with a wet and dry 4x4.    A clear shield was taped over the eye. The patient was taken to the post-operative care unit in good condition, having tolerated the procedure well.  Post-Op Instructions: The patient will follow up at [facility] for a same day post-operative evaluation and will receive all other orders and instructions.

## 2023-01-23 NOTE — Discharge Instructions (Signed)
Please discharge patient when stable, will follow up today with Dr. Anieya Helman at the [facility] office immediately following discharge.  Leave shield in place until visit.  All paperwork with discharge instructions will be given at the office.  [facility] Address:  [address]  [place], [place] [address]  

## 2023-01-28 ENCOUNTER — Encounter (HOSPITAL_COMMUNITY): Payer: Self-pay | Admitting: Ophthalmology

## 2023-02-02 ENCOUNTER — Encounter (HOSPITAL_COMMUNITY)
Admission: RE | Admit: 2023-02-02 | Discharge: 2023-02-02 | Disposition: A | Discharge status: Home / Self Care | Payer: Medicare Other | Source: Ambulatory Visit | Attending: Ophthalmology | Admitting: Ophthalmology

## 2023-02-02 NOTE — Pre-Procedure Instructions (Signed)
Attempted pre-op phone call. Left Vm for him to call us back. 

## 2023-02-03 NOTE — Pre-Procedure Instructions (Signed)
Attempted pre-op phone call. Left VM for him to call us back. 

## 2023-02-04 NOTE — H&P (Signed)
Surgical History & Physical  Patient Name: William Wells DOB: 1933-12-20  Surgery: Cataract extraction with intraocular lens implant phacoemulsification; Right Eye  Surgeon: Fabio Pierce MD Surgery Date:  02-06-23 Pre-Op Date:  01-29-23  HPI: A 67 Yr. old male patient 1. The patient is returning after cataract surgery. The left eye is affected. Status post cataract surgery, which began 1 weeks ago: Since the last visit, the affected area is doing well. The patient's vision is improved. Patient is following medication instructions. 2. 2. The patient is returning for a cataract follow-up of the right eye. Since the last visit, the affected area is worsening. The patient's vision is blurry. The complaint is associated with difficulty driving at night due to halos/glare, difficulty recognizing people at a distance, and difficulty seeing captions on tv. This is negatively affecting the patient's quality of life and the patient is unable to function adequately in life with the current level of vision. HPI was performed by Fabio Pierce .  Medical History: Macula Degeneration Glaucoma Cataracts Arthritis Cancer Diabetes End stage renal failure, acid reflux High Blood Pressure Thyroid Problems Ulcers  Review of Systems Cardiovascular High Blood Pressure Musculoskeletal arthritis aches All recorded systems are negative except as noted above.  Social   Former smoker /  Cigarettes   Medication Prednisolone-Moxifloxacin-Bromfenac,  Amlodipine, Gabapentin, Glipizide, Levothyroxine, Omeprazole, Rosuvastatin, Tamsulosin,   Sx/Procedures Strabismus Sx, Phaco c IOL OS,  Lung sx, Appendectomy, Jaw sx,   Drug Allergies   NKDA  History & Physical: Heent: cataract, right eye NECK: supple without bruits LUNGS: lungs clear to auscultation CV: regular rate and rhythm Abdomen: soft and non-tender  Impression & Plan: Assessment: 1.  CATARACT EXTRACTION STATUS; Left Eye (Z98.42) 2.   INTRAOCULAR LENS IOL ; Left Eye (Z96.1) 3.  NUCLEAR SCLEROSIS AGE RELATED; Right Eye (H25.11)  Plan: 1.  1 week after cataract surgery. Doing well with improved vision and normal eye pressure. Call with any problems or concerns. Continue Pred-Moxi-Brom 2x/day for 3 more weeks.  2.  Doing well since surgery Continue Post-op medications  3.  Cataract accounts for the patient's decreased vision. This visual impairment is not correctable with a tolerable change in glasses or contact lenses. Cataract surgery with an implantation of a new lens should significantly improve the visual and functional status of the patient. Discussed all risks, benefits, alternatives, and potential complications. Discussed the procedures and recovery. Patient desires to have surgery. A-scan ordered and performed today for intra-ocular lens calculations. The surgery will be performed in order to improve vision for driving, reading, and for eye examinations. Recommend phacoemulsification with intra-ocular lens. Recommend Dextenza for post-operative pain and inflammation. Right Eye. Surgery required to correct imbalance of vision. Dilates poorly - shugarcaine by protocol. Malyugin Ring. Omidira.

## 2023-02-05 NOTE — Pre-Procedure Instructions (Signed)
Attempted pre-op phone call. Left VM for him to call us back. I also messaged Zella Richer at Dr [facility] Miners office to let her know we have been unable to contact patient.

## 2023-02-06 ENCOUNTER — Encounter (HOSPITAL_COMMUNITY): Admission: RE | Payer: Self-pay | Source: Home / Self Care

## 2023-02-06 ENCOUNTER — Ambulatory Visit (HOSPITAL_COMMUNITY): Admission: RE | Admit: 2023-02-06 | Payer: Medicare Other | Source: Home / Self Care | Admitting: Ophthalmology

## 2023-02-06 SURGERY — PHACOEMULSIFICATION, CATARACT, WITH IOL INSERTION
Anesthesia: Monitor Anesthesia Care | Laterality: Right

## 2023-02-12 DIAGNOSIS — E1129 Type 2 diabetes mellitus with other diabetic kidney complication: Secondary | ICD-10-CM | POA: Diagnosis not present

## 2023-02-12 DIAGNOSIS — H2511 Age-related nuclear cataract, right eye: Secondary | ICD-10-CM | POA: Diagnosis not present

## 2023-02-12 DIAGNOSIS — R809 Proteinuria, unspecified: Secondary | ICD-10-CM | POA: Diagnosis not present

## 2023-02-12 DIAGNOSIS — D638 Anemia in other chronic diseases classified elsewhere: Secondary | ICD-10-CM | POA: Diagnosis not present

## 2023-02-12 DIAGNOSIS — N189 Chronic kidney disease, unspecified: Secondary | ICD-10-CM | POA: Diagnosis not present

## 2023-02-12 DIAGNOSIS — N1832 Chronic kidney disease, stage 3b: Secondary | ICD-10-CM | POA: Diagnosis not present

## 2023-02-14 DIAGNOSIS — E1129 Type 2 diabetes mellitus with other diabetic kidney complication: Secondary | ICD-10-CM | POA: Diagnosis not present

## 2023-02-14 DIAGNOSIS — N1832 Chronic kidney disease, stage 3b: Secondary | ICD-10-CM | POA: Diagnosis not present

## 2023-02-14 DIAGNOSIS — D638 Anemia in other chronic diseases classified elsewhere: Secondary | ICD-10-CM | POA: Diagnosis not present

## 2023-02-14 DIAGNOSIS — R809 Proteinuria, unspecified: Secondary | ICD-10-CM | POA: Diagnosis not present

## 2023-02-16 ENCOUNTER — Encounter (HOSPITAL_COMMUNITY): Payer: Self-pay

## 2023-02-16 ENCOUNTER — Encounter (HOSPITAL_COMMUNITY)
Admission: RE | Admit: 2023-02-16 | Discharge: 2023-02-16 | Disposition: A | Discharge status: Home / Self Care | Payer: Medicare Other | Source: Ambulatory Visit | Attending: Ophthalmology | Admitting: Ophthalmology

## 2023-02-16 ENCOUNTER — Other Ambulatory Visit: Payer: Self-pay

## 2023-02-18 NOTE — H&P (Signed)
Surgical History & Physical  Patient Name: William Wells DOB: [birth year]  Surgery: Cataract extraction with intraocular lens implant phacoemulsification; Right Eye  Surgeon: Fabio Pierce MD Surgery Date:  02-20-23 Pre-Op Date:  01-29-23  HPI: A 62 Yr. old male patient 1. The patient is returning after cataract surgery. The left eye is affected. Status post cataract surgery, which began 1 weeks ago: Since the last visit, the affected area is doing well. The patient's vision is improved. Patient is following medication instructions. 2. 2. The patient is returning for a cataract follow-up of the right eye. Since the last visit, the affected area is worsening. The patient's vision is blurry. The complaint is associated with difficulty driving at night due to halos/glare, difficulty recognizing people at a distance, and difficulty seeing captions on tv. This is negatively affecting the patient's quality of life and the patient is unable to function adequately in life with the current level of vision. HPI was performed by Fabio Pierce .  Medical History: Macula Degeneration Glaucoma Cataracts Arthritis Cancer Diabetes End stage renal failure, acid reflux High Blood Pressure Thyroid Problems Ulcers  Review of Systems Cardiovascular High Blood Pressure Musculoskeletal arthritis aches All recorded systems are negative except as noted above.  Social   Former smoker /  Cigarettes   Medication Prednisolone-Moxifloxacin-Bromfenac,  Amlodipine, Gabapentin, Glipizide, Levothyroxine, Omeprazole, Rosuvastatin, Tamsulosin,   Sx/Procedures Strabismus Sx, Phaco c IOL OS,  Lung sx, Appendectomy, Jaw sx,   Drug Allergies   NKDA  History & Physical: Heent: cataract, right eye NECK: supple without bruits LUNGS: lungs clear to auscultation CV: regular rate and rhythm Abdomen: soft and non-tender Impression & Plan: Assessment: 1.  CATARACT EXTRACTION STATUS; Left Eye (Z98.42) 2.   INTRAOCULAR LENS IOL ; Left Eye (Z96.1) 3.  NUCLEAR SCLEROSIS AGE RELATED; Right Eye (H25.11)  Plan: 1.  1 week after cataract surgery. Doing well with improved vision and normal eye pressure. Call with any problems or concerns. Continue Pred-Moxi-Brom 2x/day for 3 more weeks.  2.  Doing well since surgery Continue Post-op medications  3.  Cataract accounts for the patient's decreased vision. This visual impairment is not correctable with a tolerable change in glasses or contact lenses. Cataract surgery with an implantation of a new lens should significantly improve the visual and functional status of the patient. Discussed all risks, benefits, alternatives, and potential complications. Discussed the procedures and recovery. Patient desires to have surgery. A-scan ordered and performed today for intra-ocular lens calculations. The surgery will be performed in order to improve vision for driving, reading, and for eye examinations. Recommend phacoemulsification with intra-ocular lens. Recommend Dextenza for post-operative pain and inflammation. Right Eye. Surgery required to correct imbalance of vision. Dilates poorly - shugarcaine by protocol. Malyugin Ring. Omidira.

## 2023-02-19 DIAGNOSIS — E559 Vitamin D deficiency, unspecified: Secondary | ICD-10-CM | POA: Diagnosis not present

## 2023-02-19 DIAGNOSIS — E039 Hypothyroidism, unspecified: Secondary | ICD-10-CM | POA: Diagnosis not present

## 2023-02-19 DIAGNOSIS — E1122 Type 2 diabetes mellitus with diabetic chronic kidney disease: Secondary | ICD-10-CM | POA: Diagnosis not present

## 2023-02-20 ENCOUNTER — Encounter (HOSPITAL_COMMUNITY): Payer: Self-pay | Admitting: Ophthalmology

## 2023-02-20 ENCOUNTER — Ambulatory Visit (HOSPITAL_COMMUNITY): Payer: Medicare Other | Admitting: Anesthesiology

## 2023-02-20 ENCOUNTER — Ambulatory Visit (HOSPITAL_COMMUNITY)
Admission: RE | Admit: 2023-02-20 | Discharge: 2023-02-20 | Disposition: A | Discharge status: Home / Self Care | Payer: Medicare Other | Attending: Ophthalmology | Admitting: Ophthalmology

## 2023-02-20 ENCOUNTER — Encounter (HOSPITAL_COMMUNITY)
Admission: RE | Disposition: A | Discharge status: Home / Self Care | Payer: Self-pay | Source: Home / Self Care | Attending: Ophthalmology

## 2023-02-20 ENCOUNTER — Ambulatory Visit (HOSPITAL_BASED_OUTPATIENT_CLINIC_OR_DEPARTMENT_OTHER): Payer: Medicare Other | Admitting: Anesthesiology

## 2023-02-20 DIAGNOSIS — K449 Diaphragmatic hernia without obstruction or gangrene: Secondary | ICD-10-CM | POA: Insufficient documentation

## 2023-02-20 DIAGNOSIS — Z79899 Other long term (current) drug therapy: Secondary | ICD-10-CM | POA: Diagnosis not present

## 2023-02-20 DIAGNOSIS — Z961 Presence of intraocular lens: Secondary | ICD-10-CM | POA: Diagnosis not present

## 2023-02-20 DIAGNOSIS — Z87891 Personal history of nicotine dependence: Secondary | ICD-10-CM

## 2023-02-20 DIAGNOSIS — H2181 Floppy iris syndrome: Secondary | ICD-10-CM | POA: Diagnosis not present

## 2023-02-20 DIAGNOSIS — E119 Type 2 diabetes mellitus without complications: Secondary | ICD-10-CM | POA: Diagnosis not present

## 2023-02-20 DIAGNOSIS — E1122 Type 2 diabetes mellitus with diabetic chronic kidney disease: Secondary | ICD-10-CM | POA: Insufficient documentation

## 2023-02-20 DIAGNOSIS — H2511 Age-related nuclear cataract, right eye: Secondary | ICD-10-CM

## 2023-02-20 DIAGNOSIS — I10 Essential (primary) hypertension: Secondary | ICD-10-CM | POA: Diagnosis not present

## 2023-02-20 DIAGNOSIS — Z9842 Cataract extraction status, left eye: Secondary | ICD-10-CM | POA: Insufficient documentation

## 2023-02-20 DIAGNOSIS — Z7984 Long term (current) use of oral hypoglycemic drugs: Secondary | ICD-10-CM | POA: Diagnosis not present

## 2023-02-20 DIAGNOSIS — K219 Gastro-esophageal reflux disease without esophagitis: Secondary | ICD-10-CM | POA: Insufficient documentation

## 2023-02-20 DIAGNOSIS — E039 Hypothyroidism, unspecified: Secondary | ICD-10-CM

## 2023-02-20 DIAGNOSIS — D638 Anemia in other chronic diseases classified elsewhere: Secondary | ICD-10-CM

## 2023-02-20 DIAGNOSIS — I120 Hypertensive chronic kidney disease with stage 5 chronic kidney disease or end stage renal disease: Secondary | ICD-10-CM | POA: Diagnosis not present

## 2023-02-20 DIAGNOSIS — E1136 Type 2 diabetes mellitus with diabetic cataract: Secondary | ICD-10-CM | POA: Insufficient documentation

## 2023-02-20 DIAGNOSIS — H259 Unspecified age-related cataract: Secondary | ICD-10-CM | POA: Diagnosis not present

## 2023-02-20 DIAGNOSIS — N186 End stage renal disease: Secondary | ICD-10-CM | POA: Insufficient documentation

## 2023-02-20 HISTORY — PX: CATARACT EXTRACTION W/PHACO: SHX586

## 2023-02-20 LAB — GLUCOSE, CAPILLARY
Glucose-Capillary: 82 mg/dL (ref 70–99)
Glucose-Capillary: 115 mg/dL — ABNORMAL HIGH (ref 70–99)

## 2023-02-20 SURGERY — PHACOEMULSIFICATION, CATARACT, WITH IOL INSERTION
Anesthesia: Monitor Anesthesia Care | Site: Eye | Laterality: Right

## 2023-02-20 MED ORDER — STERILE WATER FOR IRRIGATION IR SOLN
Status: DC | PRN
  Administered 2023-02-20: 250 mL

## 2023-02-20 MED ORDER — SODIUM HYALURONATE 23MG/ML IO SOSY
PREFILLED_SYRINGE | INTRAOCULAR | Status: DC | PRN
  Administered 2023-02-20: .6 mL via INTRAOCULAR

## 2023-02-20 MED ORDER — NEOMYCIN-POLYMYXIN-DEXAMETH 3.5-10000-0.1 OP SUSP
OPHTHALMIC | Status: DC | PRN
  Administered 2023-02-20: 2 [drp] via OPHTHALMIC

## 2023-02-20 MED ORDER — LIDOCAINE HCL 3.5 % OP GEL
1.0000 | Freq: Once | OPHTHALMIC | Status: AC
  Administered 2023-02-20: 1 via OPHTHALMIC

## 2023-02-20 MED ORDER — PHENYLEPHRINE HCL 2.5 % OP SOLN
1.0000 [drp] | OPHTHALMIC | Status: AC | PRN
  Administered 2023-02-20 (×3): 1 [drp] via OPHTHALMIC

## 2023-02-20 MED ORDER — PROPOFOL 500 MG/50ML IV EMUL
INTRAVENOUS | Status: AC
  Filled 2023-02-20: qty 50

## 2023-02-20 MED ORDER — PHENYLEPHRINE-KETOROLAC 1-0.3 % IO SOLN
INTRAOCULAR | Status: DC | PRN
  Administered 2023-02-20: 500 mL via OPHTHALMIC

## 2023-02-20 MED ORDER — LIDOCAINE HCL (PF) 1 % IJ SOLN
INTRAOCULAR | Status: DC | PRN
  Administered 2023-02-20: 1 mL via OPHTHALMIC

## 2023-02-20 MED ORDER — SODIUM HYALURONATE 10 MG/ML IO SOLUTION
PREFILLED_SYRINGE | INTRAOCULAR | Status: DC | PRN
  Administered 2023-02-20: .85 mL via INTRAOCULAR

## 2023-02-20 MED ORDER — DEXTROSE 50 % IV SOLN
25.0000 mL | Freq: Once | INTRAVENOUS | Status: AC
  Administered 2023-02-20: 25 mL via INTRAVENOUS

## 2023-02-20 MED ORDER — BSS IO SOLN
INTRAOCULAR | Status: DC | PRN
  Administered 2023-02-20: 15 mL via INTRAOCULAR

## 2023-02-20 MED ORDER — DEXTROSE 50 % IV SOLN
INTRAVENOUS | Status: AC
  Filled 2023-02-20: qty 50

## 2023-02-20 MED ORDER — TETRACAINE HCL 0.5 % OP SOLN
1.0000 [drp] | OPHTHALMIC | Status: AC | PRN
  Administered 2023-02-20 (×3): 1 [drp] via OPHTHALMIC

## 2023-02-20 MED ORDER — POVIDONE-IODINE 5 % OP SOLN
OPHTHALMIC | Status: DC | PRN
  Administered 2023-02-20: 1 via OPHTHALMIC

## 2023-02-20 MED ORDER — TROPICAMIDE 1 % OP SOLN
1.0000 [drp] | OPHTHALMIC | Status: AC | PRN
  Administered 2023-02-20 (×3): 1 [drp] via OPHTHALMIC

## 2023-02-20 MED ORDER — MIDAZOLAM HCL 2 MG/2ML IJ SOLN
INTRAMUSCULAR | Status: AC
  Filled 2023-02-20: qty 2

## 2023-02-20 SURGICAL SUPPLY — 14 items
CATARACT SUITE SIGHTPATH (MISCELLANEOUS) ×1 IMPLANT
CLOTH BEACON ORANGE TIMEOUT ST (SAFETY) ×1 IMPLANT
EYE SHIELD UNIVERSAL CLEAR (GAUZE/BANDAGES/DRESSINGS) IMPLANT
FEE CATARACT SUITE SIGHTPATH (MISCELLANEOUS) ×1 IMPLANT
GLOVE BIOGEL PI IND STRL 7.0 (GLOVE) ×2 IMPLANT
LENS IOL RAYNER 23.0 (Intraocular Lens) ×1 IMPLANT
LENS IOL RAYONE EMV 23.0 (Intraocular Lens) IMPLANT
NDL HYPO 18GX1.5 BLUNT FILL (NEEDLE) ×1 IMPLANT
NEEDLE HYPO 18GX1.5 BLUNT FILL (NEEDLE) ×1 IMPLANT
PAD ARMBOARD 7.5X6 YLW CONV (MISCELLANEOUS) ×1 IMPLANT
RING MALYGIN 7.0 (MISCELLANEOUS) IMPLANT
SYR TB 1ML LL NO SAFETY (SYRINGE) ×1 IMPLANT
TAPE SURG TRANSPORE 1 IN (GAUZE/BANDAGES/DRESSINGS) IMPLANT
WATER STERILE IRR 250ML POUR (IV SOLUTION) ×1 IMPLANT

## 2023-02-20 NOTE — Anesthesia Postprocedure Evaluation (Signed)
Anesthesia Post Note  Patient: William Wells  Procedure(s) Performed: CATARACT EXTRACTION PHACO AND INTRAOCULAR LENS PLACEMENT (IOC) (Right: Eye)  Patient location during evaluation: Phase II Anesthesia Type: MAC Level of consciousness: awake and alert and oriented Pain management: pain level controlled Vital Signs Assessment: post-procedure vital signs reviewed and stable Respiratory status: spontaneous breathing, nonlabored ventilation and respiratory function stable Cardiovascular status: blood pressure returned to baseline and stable Postop Assessment: no apparent nausea or vomiting Anesthetic complications: no  No notable events documented.   Last Vitals:  Vitals:   02/20/23 0840 02/20/23 0944  BP: (!) 140/79 136/74  Pulse: (!) 59 64  Resp: (!) 21 (!) 22  Temp: 36.4 C (!) 36.3 C  SpO2: 100% 98%    Last Pain:  Vitals:   02/20/23 0944  TempSrc: Oral  PainSc: 0-No pain                 Soundra Lampley C Kippy Melena

## 2023-02-20 NOTE — Transfer of Care (Signed)
Immediate Anesthesia Transfer of Care Note  Patient: William Wells  Procedure(s) Performed: CATARACT EXTRACTION PHACO AND INTRAOCULAR LENS PLACEMENT (IOC) (Right: Eye)  Patient Location: Short Stay  Anesthesia Type:MAC  Level of Consciousness: awake  Airway & Oxygen Therapy: Patient Spontanous Breathing  Post-op Assessment: Report given to RN and Post -op Vital signs reviewed and stable  Post vital signs: Reviewed and stable  Last Vitals:  Vitals Value Taken Time  BP 136/74 02/20/23 0944  Temp 36.3 C 02/20/23 0944  Pulse 64 02/20/23 0944  Resp 22 02/20/23 0944  SpO2 98 % 02/20/23 0944    Last Pain:  Vitals:   02/20/23 0944  TempSrc: Oral  PainSc: 0-No pain         Complications: No notable events documented.

## 2023-02-20 NOTE — Anesthesia Preprocedure Evaluation (Signed)
Anesthesia Evaluation  Patient identified by MRN, date of birth, ID band Patient awake    Reviewed: Allergy & Precautions, H&P , NPO status , Patient's Chart, lab work & pertinent test results  History of Anesthesia Complications (+) history of anesthetic complications  Airway Mallampati: II  TM Distance: >3 FB Neck ROM: Full    Dental no notable dental hx. (+) Edentulous Upper, Edentulous Lower   Pulmonary neg pulmonary ROS, former smoker   Pulmonary exam normal breath sounds clear to auscultation       Cardiovascular Exercise Tolerance: Good hypertension, Pt. on medications Normal cardiovascular exam Rhythm:Regular Rate:Normal     Neuro/Psych  Neuromuscular disease  negative psych ROS   GI/Hepatic Neg liver ROS, hiatal hernia,GERD  Medicated and Controlled,,  Endo/Other  diabetes (CBG 82 - half amp of D50 was given), Well Controlled, Type 2, Oral Hypoglycemic AgentsHypothyroidism    Renal/GU Renal InsufficiencyRenal disease  negative genitourinary   Musculoskeletal  (+) Arthritis , Osteoarthritis,    Abdominal   Peds negative pediatric ROS (+)  Hematology  (+) Blood dyscrasia, anemia   Anesthesia Other Findings   Reproductive/Obstetrics negative OB ROS                             Anesthesia Physical Anesthesia Plan  ASA: 2  Anesthesia Plan: MAC   Post-op Pain Management: Minimal or no pain anticipated   Induction:   PONV Risk Score and Plan: Treatment may vary due to age or medical condition  Airway Management Planned: Nasal Cannula and Natural Airway  Additional Equipment:   Intra-op Plan:   Post-operative Plan:   Informed Consent: I have reviewed the patients History and Physical, chart, labs and discussed the procedure including the risks, benefits and alternatives for the proposed anesthesia with the patient or authorized representative who has indicated his/her  understanding and acceptance.       Plan Discussed with: CRNA and Surgeon  Anesthesia Plan Comments:         Anesthesia Quick Evaluation

## 2023-02-20 NOTE — Discharge Instructions (Signed)
Please discharge patient when stable, will follow up today with Dr. Cleven Jansma at the [facility] office immediately following discharge.  Leave shield in place until visit.  All paperwork with discharge instructions will be given at the office.   [facility] Address:  [address]  [place], [place] [address]  

## 2023-02-20 NOTE — Interval H&P Note (Signed)
History and Physical Interval Note:  02/20/2023 9:17 AM  William Wells  has presented today for surgery, with the diagnosis of nuclear sclerosis age related cataract; right.  The various methods of treatment have been discussed with the patient and family. After consideration of risks, benefits and other options for treatment, the patient has consented to  Procedure(s) with comments: CATARACT EXTRACTION PHACO AND INTRAOCULAR LENS PLACEMENT (IOC) (Right) - CDE: as a surgical intervention.  The patient's history has been reviewed, patient examined, no change in status, stable for surgery.  I have reviewed the patient's chart and labs.  Questions were answered to the patient's satisfaction.     Fabio Pierce

## 2023-02-20 NOTE — Op Note (Signed)
Date of procedure: 02/20/23  Pre-operative diagnosis: Visually significant age-related nuclear cataract, Right Eye; Poor Dilation, Right Eye (H25.11)  Post-operative diagnosis: Visually significant age-related cataract, Right Eye; Intra-operative Floppy Iris Syndrome, Right Eye (H21.81)  Procedure: Removal of cataract via phacoemulsification and insertion of intra-ocular lens Rayner [id number] +23.0D into the capsular bag of the Right Eye (CPT [phone])  Attending surgeon: Rudy Jew. Anyra Kaufman, MD, MA  Anesthesia: MAC, Topical Akten  Complications: None  Estimated Blood Loss: <39mL (minimal)  Specimens: None  Implants: As above  Indications:  Visually significant cataract, Right Eye  Procedure:  The patient was seen and identified in the pre-operative area. The operative eye was identified and dilated.  The operative eye was marked.  Topical anesthesia was administered to the operative eye.     The patient was then to the operative suite and placed in the supine position.  A timeout was performed confirming the patient, procedure to be performed, and all other relevant information.   The patient's face was prepped and draped in the usual fashion for intra-ocular surgery.  A lid speculum was placed into the operative eye and the surgical microscope moved into place and focused.  Poor dilation of the iris was confirmed.  A superotemporal paracentesis was created using a 20 gauge paracentesis blade.  Shugarcaine was injected into the anterior chamber.  Viscoelastic was injected into the anterior chamber.  A temporal clear-corneal main wound incision was created using a 2.38mm microkeratome.  A Malyugin ring was placed.  A continuous curvilinear capsulorrhexis was initiated using an irrigating cystitome and completed using capsulorrhexis forceps.  Hydrodissection and hydrodeliniation were performed.  Viscoelastic was injected into the anterior chamber.  A phacoemulsification handpiece and a chopper as a  second instrument were used to remove the nucleus and epinucleus. The irrigation/aspiration handpiece was used to remove any remaining cortical material.   The capsular bag was reinflated with viscoelastic, checked, and found to be intact.  The intraocular lens was inserted into the capsular bag and dialed into place using a Customer service manager.  The Malyugin ring was removed.  The irrigation/aspiration handpiece was used to remove any remaining viscoelastic.  The clear corneal wound and paracentesis wounds were then hydrated and checked with Weck-Cels to be watertight. Maxitrol drops were instilled into the operative eye.  The lid-speculum and drape was removed, and the patient's face was cleaned with a wet and dry 4x4. A clear shield was taped over the eye. The patient was taken to the post-operative care unit in good condition, having tolerated the procedure well.  Post-Op Instructions: The patient will follow up at [facility] for a same day post-operative evaluation and will receive all other orders and instructions.

## 2023-02-24 ENCOUNTER — Encounter (HOSPITAL_COMMUNITY): Payer: Self-pay | Admitting: Ophthalmology

## 2023-02-27 DIAGNOSIS — M109 Gout, unspecified: Secondary | ICD-10-CM | POA: Diagnosis not present

## 2023-02-27 DIAGNOSIS — I129 Hypertensive chronic kidney disease with stage 1 through stage 4 chronic kidney disease, or unspecified chronic kidney disease: Secondary | ICD-10-CM | POA: Diagnosis not present

## 2023-02-27 DIAGNOSIS — N1832 Chronic kidney disease, stage 3b: Secondary | ICD-10-CM | POA: Diagnosis not present

## 2023-02-27 DIAGNOSIS — D472 Monoclonal gammopathy: Secondary | ICD-10-CM | POA: Diagnosis not present

## 2023-02-27 DIAGNOSIS — E559 Vitamin D deficiency, unspecified: Secondary | ICD-10-CM | POA: Diagnosis not present

## 2023-02-27 DIAGNOSIS — Z85118 Personal history of other malignant neoplasm of bronchus and lung: Secondary | ICD-10-CM | POA: Diagnosis not present

## 2023-02-27 DIAGNOSIS — E1122 Type 2 diabetes mellitus with diabetic chronic kidney disease: Secondary | ICD-10-CM | POA: Diagnosis not present

## 2023-02-27 DIAGNOSIS — R519 Headache, unspecified: Secondary | ICD-10-CM | POA: Diagnosis not present

## 2023-02-27 DIAGNOSIS — E782 Mixed hyperlipidemia: Secondary | ICD-10-CM | POA: Diagnosis not present

## 2023-02-27 DIAGNOSIS — K219 Gastro-esophageal reflux disease without esophagitis: Secondary | ICD-10-CM | POA: Diagnosis not present

## 2023-02-27 DIAGNOSIS — R634 Abnormal weight loss: Secondary | ICD-10-CM | POA: Diagnosis not present

## 2023-03-09 DIAGNOSIS — H353211 Exudative age-related macular degeneration, right eye, with active choroidal neovascularization: Secondary | ICD-10-CM | POA: Diagnosis not present

## 2023-03-09 DIAGNOSIS — H353122 Nonexudative age-related macular degeneration, left eye, intermediate dry stage: Secondary | ICD-10-CM | POA: Diagnosis not present

## 2023-03-09 DIAGNOSIS — H35033 Hypertensive retinopathy, bilateral: Secondary | ICD-10-CM | POA: Diagnosis not present

## 2023-03-09 DIAGNOSIS — H43813 Vitreous degeneration, bilateral: Secondary | ICD-10-CM | POA: Diagnosis not present

## 2023-04-06 DIAGNOSIS — H353211 Exudative age-related macular degeneration, right eye, with active choroidal neovascularization: Secondary | ICD-10-CM | POA: Diagnosis not present

## 2023-04-22 ENCOUNTER — Encounter: Payer: Self-pay | Admitting: Emergency Medicine

## 2023-04-22 ENCOUNTER — Ambulatory Visit
Admission: EM | Admit: 2023-04-22 | Discharge: 2023-04-22 | Disposition: A | Discharge status: Home / Self Care | Payer: Medicare Other | Attending: Family Medicine | Admitting: Family Medicine

## 2023-04-22 DIAGNOSIS — J069 Acute upper respiratory infection, unspecified: Secondary | ICD-10-CM

## 2023-04-22 DIAGNOSIS — U071 COVID-19: Secondary | ICD-10-CM | POA: Insufficient documentation

## 2023-04-22 NOTE — Discharge Instructions (Signed)
We have tested you for COVID today and these results should return in the morning.  If positive, someone will reach out to you to discuss antiviral options.  In the meantime, get lots of rest and drink plenty of fluids.  You may also take Coricidin HBP, plain Mucinex, Flonase nasal spray, humidifiers and sinus rinses.  Follow-up for significantly worsening symptoms.

## 2023-04-22 NOTE — ED Provider Notes (Signed)
[facility]    CSN: [id number] Arrival date & time: 04/22/23  1359      History   Chief Complaint No chief complaint on file.   HPI William Wells is a 89 y.o. male.   Patient presenting today with 1 day history of generalized weakness, body aches, chills, cough, sneezing, runny nose, scratchy throat.  Denies fevers, chest pain, shortness of breath, abdominal pain, nausea vomiting or diarrhea.  So far taking Mucinex and Tylenol with minimal relief.  Multiple sick contacts recently within the family.  No known history of chronic pulmonary disease per patient.    Past Medical History:  Diagnosis Date   Acid reflux disease    Arthritis    Complication of anesthesia    appendix surgery (89 year old) - passed out once patient got back to floor unit   Diabetes mellitus    Hiatal hernia    High cholesterol    Hypertension    Hypothyroidism    Monoclonal gammopathy of unknown significance (MGUS) 03/28/2020   Normocytic anemia 03/28/2020   Renal disorder    renal insufficiency   Type 2 diabetes mellitus [facility])     Patient Active Problem List   Diagnosis Date Noted   Adenocarcinoma of left lung, stage 1 (HCC) 06/26/2020   Status post robot-assisted surgical procedure 06/22/2020   S/P robot-assisted surgical procedure 06/22/2020   Mass of upper lobe of left lung 04/11/2020   Monoclonal gammopathy of unknown significance (MGUS) 03/28/2020   Normocytic anemia 03/28/2020   Hyperkalemia 07/15/2019   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) 07/15/2019   Benign hypertension with chronic kidney disease 07/15/2019   Sciatica, left side 02/17/2018    Past Surgical History:  Procedure Laterality Date   APPENDECTOMY     BRONCHIAL BIOPSY  05/29/2020   Procedure: BRONCHIAL BIOPSIES;  Surgeon: Leslye Peer, MD;  Location: [facility] ENDOSCOPY;  Service: Pulmonary;;   BRONCHIAL BRUSHINGS  05/29/2020   Procedure: BRONCHIAL BRUSHINGS;  Surgeon: Leslye Peer, MD;   Location: [facility] ENDOSCOPY;  Service: Pulmonary;;   BRONCHIAL NEEDLE ASPIRATION BIOPSY  05/29/2020   Procedure: BRONCHIAL NEEDLE ASPIRATION BIOPSIES;  Surgeon: Leslye Peer, MD;  Location: [facility] ENDOSCOPY;  Service: Pulmonary;;   BRONCHIAL WASHINGS  05/29/2020   Procedure: BRONCHIAL WASHINGS;  Surgeon: Leslye Peer, MD;  Location: [facility] ENDOSCOPY;  Service: Pulmonary;;   CATARACT EXTRACTION W/PHACO Left 01/23/2023   Procedure: CATARACT EXTRACTION PHACO AND INTRAOCULAR LENS PLACEMENT (IOC);  Surgeon: Fabio Pierce, MD;  Location: [facility]S;  Service: Ophthalmology;  Laterality: Left;  CDE: 13.69   CATARACT EXTRACTION W/PHACO Right 02/20/2023   Procedure: CATARACT EXTRACTION PHACO AND INTRAOCULAR LENS PLACEMENT (IOC);  Surgeon: Fabio Pierce, MD;  Location: [facility];  Service: Ophthalmology;  Laterality: Right;  CDE: 12.88   EYE SURGERY     INTERCOSTAL NERVE BLOCK Left 06/22/2020   Procedure: INTERCOSTAL NERVE BLOCK;  Surgeon: Loreli Slot, MD;  Location[facility] OR;  Service: Thoracic;  Laterality: Left;   NODE DISSECTION Left 06/22/2020   Procedure: NODE DISSECTION;  Surgeon: Loreli Slot, MD;  Location: [facility] OR;  Service: Thoracic;  Laterality: Left;   TONSILLECTOMY     VIDEO BRONCHOSCOPY WITH ENDOBRONCHIAL NAVIGATION N/A 05/29/2020   Procedure: VIDEO BRONCHOSCOPY WITH ENDOBRONCHIAL NAVIGATION;  Surgeon: Leslye Peer, MD;  Location: [facility] ENDOSCOPY;  Service: Pulmonary;  Laterality: N/A;       Home Medications    Prior to Admission medications   Medication Sig  Start Date End Date Taking? Authorizing Provider  acetaminophen (TYLENOL) 500 MG tablet Take 2 tablets (1,000 mg total) by mouth every 6 (six) hours. 06/26/20   Sharlene Dory, PA-C  Cholecalciferol 50 MCG (2000 UT) TABS Take 2,000 Units by mouth daily.     [provider]  docusate sodium (COLACE) 100 MG capsule Take 1 capsule (100 mg total) by mouth 2 (two) times daily as needed for mild constipation. 06/26/20   Sharlene Dory, PA-C  gabapentin (NEURONTIN) 300 MG capsule Take 300 mg by mouth 3 (three) times daily. 12/13/21   [provider]  glipiZIDE (GLUCOTROL XL) 2.5 MG 24 hr tablet Take 2.5 mg by mouth daily as needed (blood sugar above 160).  03/20/20   [provider]  Lancets (ONETOUCH DELICA PLUS LANCET30G) MISC 2 (two) times daily. 03/27/20   [provider]  levothyroxine (SYNTHROID) 75 MCG tablet Take 75 mcg by mouth daily. 05/23/21   [provider]  LOKELMA 5 g packet Take by mouth. 10/08/20   [provider]  omeprazole (PRILOSEC) 20 MG capsule Take 20 mg by mouth daily.    [provider]  [facility] ULTRA test strip 2 (two) times daily. 04/06/20   [provider]  oxyCODONE-acetaminophen (PERCOCET) 5-325 MG tablet Take 1 tablet by mouth every 6 (six) hours as needed. 09/08/21   Dartha Lodge, PA-C  rosuvastatin (CRESTOR) 40 MG tablet Take 40 mg by mouth daily.     [provider]    Family History Family History  Problem Relation Age of Onset   Diabetes Mother    Cancer Father    Cancer Sister    Diabetes Brother    Cancer Brother    Diabetes Brother    Parkinson's disease Brother    Diabetes Brother     Social History Social History   Tobacco Use   Smoking status: Former    Current packs/day: 0.00    Average packs/day: 0.5 packs/day for 47.0 years (23.5 ttl pk-yrs)    Types: Cigarettes    Start date: 65    Quit date: 2000    Years since quitting: 24.5   Smokeless tobacco: Never  Vaping Use   Vaping status: Never Used  Substance Use Topics   Alcohol use: No   Drug use: No     Allergies   Patient has no known allergies.   Review of Systems Review of Systems Per HPI  Physical Exam Triage Vital Signs ED Triage Vitals  Encounter Vitals Group     BP 04/22/23 1411 120/65     Systolic BP Percentile --      Diastolic BP Percentile --      Pulse Rate 04/22/23 1411 85     Resp 04/22/23 1411 20     Temp  04/22/23 1411 98.6 F (37 C)     Temp Source 04/22/23 1411 Oral     SpO2 04/22/23 1411 93 %     Weight --      Height --      Head Circumference --      Peak Flow --      Pain Score 04/22/23 1412 0     Pain Loc --      Pain Education --      Exclude from Growth Chart --    No data found.  Updated Vital Signs BP 120/65 (BP Location: Right Arm)   Pulse 85   Temp 98.6 F (37 C) (Oral)  Resp 20   SpO2 93%   Visual Acuity Right Eye Distance:   Left Eye Distance:   Bilateral Distance:    Right Eye Near:   Left Eye Near:    Bilateral Near:     Physical Exam Vitals and nursing note reviewed.  Constitutional:      Appearance: He is well-developed.  HENT:     Head: Atraumatic.     Right Ear: Tympanic membrane and external ear normal.     Left Ear: Tympanic membrane and external ear normal.     Nose: Rhinorrhea present.     Mouth/Throat:     Pharynx: Posterior oropharyngeal erythema present. No oropharyngeal exudate.  Eyes:     Conjunctiva/sclera: Conjunctivae normal.     Pupils: Pupils are equal, round, and reactive to light.  Cardiovascular:     Rate and Rhythm: Normal rate.     Heart sounds: Normal heart sounds.  Pulmonary:     Effort: Pulmonary effort is normal. No respiratory distress.     Breath sounds: No wheezing or rales.  Musculoskeletal:        General: Normal range of motion.     Cervical back: Normal range of motion and neck supple.  Lymphadenopathy:     Cervical: No cervical adenopathy.  Skin:    General: Skin is warm and dry.  Neurological:     Mental Status: He is alert and oriented to person, place, and time.  Psychiatric:        Behavior: Behavior normal.      UC Treatments / Results  Labs (all labs ordered are listed, but only abnormal results are displayed) Labs Reviewed  SARS CORONAVIRUS 2 (TAT 6-24 HRS)    EKG   Radiology No results found.  Procedures Procedures (including critical care time)  Medications Ordered in  UC Medications - No data to display  Initial Impression / Assessment and Plan / UC Course  I have reviewed the triage vital signs and the nursing notes.  Pertinent labs & imaging results that were available during my care of the patient were reviewed by me and considered in my medical decision making (see chart for details).     Vitals and exam overall reassuring, suspicious for viral respiratory infection.  COVID testing pending, good candidate for antiviral therapy if positive.  Treat with supportive over-the-counter medications and home care in the meantime.  Return for worsening symptoms.  Final Clinical Impressions(s) / UC Diagnoses   Final diagnoses:  Viral URI with cough     Discharge Instructions      We have tested you for COVID today and these results should return in the morning.  If positive, someone will reach out to you to discuss antiviral options.  In the meantime, get lots of rest and drink plenty of fluids.  You may also take Coricidin HBP, plain Mucinex, Flonase nasal spray, humidifiers and sinus rinses.  Follow-up for significantly worsening symptoms.    ED Prescriptions   None    PDMP not reviewed this encounter.   Particia Nearing, [place] 04/22/23 1445

## 2023-04-22 NOTE — ED Triage Notes (Signed)
Weakness started yesterday.  Has been coughing, sneezing, runny nose, chills since yesterday.   Has been taking tylenol and mucinex.

## 2023-04-23 ENCOUNTER — Telehealth (HOSPITAL_COMMUNITY): Payer: Self-pay | Admitting: Emergency Medicine

## 2023-04-23 LAB — SARS CORONAVIRUS 2 (TAT 6-24 HRS): SARS Coronavirus 2: POSITIVE — AB

## 2023-04-23 MED ORDER — MOLNUPIRAVIR EUA 200MG CAPSULE: 4.0000 | ORAL_CAPSULE | Freq: Two times a day (BID) | ORAL | 0 refills | Status: AC

## 2023-04-27 ENCOUNTER — Ambulatory Visit
Admission: EM | Admit: 2023-04-27 | Discharge: 2023-04-27 | Disposition: A | Discharge status: Home / Self Care | Payer: Medicare Other

## 2023-04-27 ENCOUNTER — Ambulatory Visit (INDEPENDENT_AMBULATORY_CARE_PROVIDER_SITE_OTHER): Payer: Medicare Other

## 2023-04-27 DIAGNOSIS — M25711 Osteophyte, right shoulder: Secondary | ICD-10-CM | POA: Diagnosis not present

## 2023-04-27 DIAGNOSIS — M25511 Pain in right shoulder: Secondary | ICD-10-CM | POA: Diagnosis not present

## 2023-04-27 DIAGNOSIS — M19011 Primary osteoarthritis, right shoulder: Secondary | ICD-10-CM | POA: Diagnosis not present

## 2023-04-27 MED ORDER — HYDROCODONE-ACETAMINOPHEN 5-325 MG PO TABS: 1.0000 | ORAL_TABLET | Freq: Three times a day (TID) | ORAL | 0 refills | Status: DC | PRN

## 2023-04-27 NOTE — ED Provider Notes (Signed)
[facility]    CSN: [id number] Arrival date & time: 04/27/23  8657      History   Chief Complaint Chief Complaint  Patient presents with   Shoulder Pain    HPI William Wells is a 89 y.o. male.   Presenting today with acute on chronic right shoulder pain that flared up yesterday when he tried to have a chair that he was sitting in.  States after that incident had severe pain and swelling to the shoulder region.  Denies discoloration, numbness, tingling, complete loss of range of motion.  Took a leftover hydrocodone that he had at home and made a makeshift sling which provided some mild relief.    Past Medical History:  Diagnosis Date   Acid reflux disease    Arthritis    Complication of anesthesia    appendix surgery (89 year old) - passed out once patient got back to floor unit   Diabetes mellitus    Hiatal hernia    High cholesterol    Hypertension    Hypothyroidism    Monoclonal gammopathy of unknown significance (MGUS) 03/28/2020   Normocytic anemia 03/28/2020   Renal disorder    renal insufficiency   Type 2 diabetes mellitus [facility])     Patient Active Problem List   Diagnosis Date Noted   Adenocarcinoma of left lung, stage 1 (HCC) 06/26/2020   Status post robot-assisted surgical procedure 06/22/2020   S/P robot-assisted surgical procedure 06/22/2020   Mass of upper lobe of left lung 04/11/2020   Monoclonal gammopathy of unknown significance (MGUS) 03/28/2020   Normocytic anemia 03/28/2020   Hyperkalemia 07/15/2019   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) 07/15/2019   Benign hypertension with chronic kidney disease 07/15/2019   Sciatica, left side 02/17/2018    Past Surgical History:  Procedure Laterality Date   APPENDECTOMY     BRONCHIAL BIOPSY  05/29/2020   Procedure: BRONCHIAL BIOPSIES;  Surgeon: Leslye Peer, MD;  Location: [facility] ENDOSCOPY;  Service: Pulmonary;;   BRONCHIAL BRUSHINGS  05/29/2020   Procedure: BRONCHIAL  BRUSHINGS;  Surgeon: Leslye Peer, MD;  Location: [facility] ENDOSCOPY;  Service: Pulmonary;;   BRONCHIAL NEEDLE ASPIRATION BIOPSY  05/29/2020   Procedure: BRONCHIAL NEEDLE ASPIRATION BIOPSIES;  Surgeon: Leslye Peer, MD;  Location: [facility] ENDOSCOPY;  Service: Pulmonary;;   BRONCHIAL WASHINGS  05/29/2020   Procedure: BRONCHIAL WASHINGS;  Surgeon: Leslye Peer, MD;  Location: [facility] ENDOSCOPY;  Service: Pulmonary;;   CATARACT EXTRACTION W/PHACO Left 01/23/2023   Procedure: CATARACT EXTRACTION PHACO AND INTRAOCULAR LENS PLACEMENT (IOC);  Surgeon: Fabio Pierce, MD;  Location: [facility];  Service: Ophthalmology;  Laterality: Left;  CDE: 13.69   CATARACT EXTRACTION W/PHACO Right 02/20/2023   Procedure: CATARACT EXTRACTION PHACO AND INTRAOCULAR LENS PLACEMENT (IOC);  Surgeon: Fabio Pierce, MD;  Location: [facility];  Service: Ophthalmology;  Laterality: Right;  CDE: 12.88   EYE SURGERY     INTERCOSTAL NERVE BLOCK Left 06/22/2020   Procedure: INTERCOSTAL NERVE BLOCK;  Surgeon: Loreli Slot, MD;  Location: [facility] OR;  Service: Thoracic;  Laterality: Left;   NODE DISSECTION Left 06/22/2020   Procedure: NODE DISSECTION;  Surgeon: Loreli Slot, MD;  Location: [facility] OR;  Service: Thoracic;  Laterality: Left;   TONSILLECTOMY     VIDEO BRONCHOSCOPY WITH ENDOBRONCHIAL NAVIGATION N/A 05/29/2020   Procedure: VIDEO BRONCHOSCOPY WITH ENDOBRONCHIAL NAVIGATION;  Surgeon: Leslye Peer, MD;  Location: [facility] ENDOSCOPY;  Service: Pulmonary;  Laterality: N/A;  Home Medications    Prior to Admission medications   Medication Sig Start Date End Date Taking? Authorizing Provider  acetaminophen (TYLENOL) 500 MG tablet Take 2 tablets (1,000 mg total) by mouth every 6 (six) hours. 06/26/20  Yes Conte, Tessa N, PA-C  gabapentin (NEURONTIN) 300 MG capsule Take 300 mg by mouth 3 (three) times daily. 12/13/21  Yes [provider]  glipiZIDE (GLUCOTROL XL) 2.5 MG 24 hr tablet Take 2.5 mg by mouth daily as needed (blood  sugar above 160).  03/20/20  Yes [provider]  Lancets (ONETOUCH DELICA PLUS LANCET30G) MISC 2 (two) times daily. 03/27/20  Yes [provider]  levothyroxine (SYNTHROID) 75 MCG tablet Take 75 mcg by mouth daily. 05/23/21  Yes [provider]  LOKELMA 5 g packet Take by mouth. 10/08/20  Yes [provider]  molnupiravir EUA (LAGEVRIO) 200 mg CAPS capsule Take 4 capsules (800 mg total) by mouth 2 (two) times daily for 5 days. 04/23/23 04/28/23 Yes Lamptey, Britta Mccreedy, MD  omeprazole (PRILOSEC) 20 MG capsule Take 20 mg by mouth daily.   Yes [provider]  [facility] ULTRA test strip 2 (two) times daily. 04/06/20  Yes [provider]  rosuvastatin (CRESTOR) 40 MG tablet Take 40 mg by mouth daily.    Yes [provider]  telmisartan (MICARDIS) 20 MG tablet Take 10 mg by mouth daily. 04/08/23  Yes [provider]  Cholecalciferol 50 MCG (2000 UT) TABS Take 2,000 Units by mouth daily.     [provider]  docusate sodium (COLACE) 100 MG capsule Take 1 capsule (100 mg total) by mouth 2 (two) times daily as needed for mild constipation. 06/26/20   Sharlene Dory, PA-C  HYDROcodone-acetaminophen (NORCO/VICODIN) 5-325 MG tablet Take 1 tablet by mouth every 8 (eight) hours as needed. 04/27/23   Particia Nearing, PA-C  oxyCODONE-acetaminophen (PERCOCET) 5-325 MG tablet Take 1 tablet by mouth every 6 (six) hours as needed. 09/08/21   Dartha Lodge, PA-C    Family History Family History  Problem Relation Age of Onset   Diabetes Mother    Cancer Father    Cancer Sister    Diabetes Brother    Cancer Brother    Diabetes Brother    Parkinson's disease Brother    Diabetes Brother     Social History Social History   Tobacco Use   Smoking status: Former    Current packs/day: 0.00    Average packs/day: 0.5 packs/day for 47.0 years (23.5 ttl pk-yrs)    Types: Cigarettes    Start date: 35    Quit date: 2000    Years since  quitting: 24.5   Smokeless tobacco: Never  Vaping Use   Vaping status: Never Used  Substance Use Topics   Alcohol use: No   Drug use: No     Allergies   Patient has no known allergies.   Review of Systems Review of Systems Per HPI  Physical Exam Triage Vital Signs ED Triage Vitals [04/27/23 1116]  Encounter Vitals Group     BP 127/83     Systolic BP Percentile      Diastolic BP Percentile      Pulse Rate 61     Resp 18     Temp 97.7 F (36.5 C)     Temp Source Oral     SpO2 94 %     Weight      Height      Head Circumference  Peak Flow      Pain Score 8     Pain Loc      Pain Education      Exclude from Growth Chart    No data found.  Updated Vital Signs BP 127/83 (BP Location: Left Arm)   Pulse 61   Temp 97.7 F (36.5 C) (Oral)   Resp 18   SpO2 94%   Visual Acuity Right Eye Distance:   Left Eye Distance:   Bilateral Distance:    Right Eye Near:   Left Eye Near:    Bilateral Near:     Physical Exam Vitals and nursing note reviewed.  Constitutional:      Appearance: Normal appearance.  HENT:     Head: Atraumatic.  Eyes:     Extraocular Movements: Extraocular movements intact.     Conjunctiva/sclera: Conjunctivae normal.  Cardiovascular:     Rate and Rhythm: Normal rate and regular rhythm.  Pulmonary:     Effort: Pulmonary effort is normal.     Breath sounds: Normal breath sounds.  Musculoskeletal:        General: Tenderness and signs of injury present. No swelling or deformity.     Cervical back: Normal range of motion and neck supple.     Comments: Limited range of motion due to pain and stiffness of the right shoulder.  Able to reach behind his back but severe pain with this movement.  Tenderness to palpation at the insertion of the deltoid  Skin:    General: Skin is warm and dry.  Neurological:     General: No focal deficit present.     Mental Status: He is oriented to person, place, and time.     Comments: Bilateral upper  extremities neurovascularly intact  Psychiatric:        Mood and Affect: Mood normal.        Thought Content: Thought content normal.        Judgment: Judgment normal.      UC Treatments / Results  Labs (all labs ordered are listed, but only abnormal results are displayed) Labs Reviewed - No data to display  EKG   Radiology DG Shoulder Right  Result Date: 04/27/2023 CLINICAL DATA:  Pain right shoulder EXAM: RIGHT SHOULDER - 2+ VIEW COMPARISON:  09/08/2021 FINDINGS: No recent fracture or dislocation is seen. There is decreased distance between humeral head and acromion suggesting chronic tear the rotator cuff. Osteopenia is seen in the bony structures. Bony spurs are noted in the acromion and glenoid. No abnormal soft tissue calcifications are noted. IMPRESSION: No recent fracture is seen. Degenerative changes are noted in right shoulder and right AC joints. There is decreased distance between acromion and humeral head suggesting chronic tear of rotator cuff. Electronically Signed   By: Ernie Avena M.D.   On: 04/27/2023 12:41    Procedures Procedures (including critical care time)  Medications Ordered in UC Medications - No data to display  Initial Impression / Assessment and Plan / UC Course  I have reviewed the triage vital signs and the nursing notes.  Pertinent labs & imaging results that were available during my care of the patient were reviewed by me and considered in my medical decision making (see chart for details).     X-ray negative for acute bony abnormality of the right shoulder, however possible signs of rotator cuff tear.  Will place in a sling provide small supply of hydrocodone for pain relief and follow-up with orthopedics soon as possible.  Return for worsening symptoms.  Final Clinical Impressions(s) / UC Diagnoses   Final diagnoses:  Acute pain of right shoulder     Discharge Instructions      Follow-up with orthopedics as soon as possible to  further evaluate your shoulder pain.  I have given you some pain medication to help in the meantime and we have given you a sling to help rest the area    ED Prescriptions     Medication Sig Dispense Auth. Provider   HYDROcodone-acetaminophen (NORCO/VICODIN) 5-325 MG tablet Take 1 tablet by mouth every 8 (eight) hours as needed. 15 tablet Particia Nearing, [place]      I have reviewed the PDMP during this encounter.   Particia Nearing, [place] 04/27/23 1316

## 2023-04-27 NOTE — Discharge Instructions (Signed)
Follow-up with orthopedics as soon as possible to further evaluate your shoulder pain.  I have given you some pain medication to help in the meantime and we have given you a sling to help rest the area

## 2023-04-27 NOTE — ED Triage Notes (Signed)
Pt was sitting in the floor 2 days ago and tried to pull himself up and started having pain in right shoulder then moved a heavy chair and started having pain and swelling, now having difficulty moving right arm.

## 2023-04-29 DIAGNOSIS — M25811 Other specified joint disorders, right shoulder: Secondary | ICD-10-CM | POA: Diagnosis not present

## 2023-04-29 DIAGNOSIS — Z713 Dietary counseling and surveillance: Secondary | ICD-10-CM | POA: Diagnosis not present

## 2023-04-29 DIAGNOSIS — U099 Post covid-19 condition, unspecified: Secondary | ICD-10-CM | POA: Diagnosis not present

## 2023-04-29 DIAGNOSIS — Z6823 Body mass index (BMI) 23.0-23.9, adult: Secondary | ICD-10-CM | POA: Diagnosis not present

## 2023-04-29 DIAGNOSIS — M12811 Other specific arthropathies, not elsewhere classified, right shoulder: Secondary | ICD-10-CM | POA: Diagnosis not present

## 2023-04-30 ENCOUNTER — Ambulatory Visit: Payer: Medicare Other | Admitting: Physician Assistant

## 2023-05-04 DIAGNOSIS — H353211 Exudative age-related macular degeneration, right eye, with active choroidal neovascularization: Secondary | ICD-10-CM | POA: Diagnosis not present

## 2023-05-13 ENCOUNTER — Other Ambulatory Visit: Payer: Self-pay

## 2023-05-13 DIAGNOSIS — D472 Monoclonal gammopathy: Secondary | ICD-10-CM

## 2023-05-13 DIAGNOSIS — C3492 Malignant neoplasm of unspecified part of left bronchus or lung: Secondary | ICD-10-CM

## 2023-05-14 ENCOUNTER — Inpatient Hospital Stay: Payer: Medicare Other | Attending: Hematology

## 2023-05-14 ENCOUNTER — Ambulatory Visit (HOSPITAL_COMMUNITY)
Admission: RE | Admit: 2023-05-14 | Discharge: 2023-05-14 | Disposition: A | Discharge status: Home / Self Care | Payer: Medicare Other | Source: Ambulatory Visit | Attending: Hematology | Admitting: Hematology

## 2023-05-14 DIAGNOSIS — Z833 Family history of diabetes mellitus: Secondary | ICD-10-CM | POA: Diagnosis not present

## 2023-05-14 DIAGNOSIS — Z9089 Acquired absence of other organs: Secondary | ICD-10-CM | POA: Diagnosis not present

## 2023-05-14 DIAGNOSIS — C3492 Malignant neoplasm of unspecified part of left bronchus or lung: Secondary | ICD-10-CM

## 2023-05-14 DIAGNOSIS — J432 Centrilobular emphysema: Secondary | ICD-10-CM | POA: Diagnosis not present

## 2023-05-14 DIAGNOSIS — Z87891 Personal history of nicotine dependence: Secondary | ICD-10-CM | POA: Diagnosis not present

## 2023-05-14 DIAGNOSIS — M170 Bilateral primary osteoarthritis of knee: Secondary | ICD-10-CM | POA: Diagnosis not present

## 2023-05-14 DIAGNOSIS — I2510 Atherosclerotic heart disease of native coronary artery without angina pectoris: Secondary | ICD-10-CM | POA: Diagnosis not present

## 2023-05-14 DIAGNOSIS — C3490 Malignant neoplasm of unspecified part of unspecified bronchus or lung: Secondary | ICD-10-CM | POA: Diagnosis not present

## 2023-05-14 DIAGNOSIS — Z79890 Hormone replacement therapy (postmenopausal): Secondary | ICD-10-CM | POA: Insufficient documentation

## 2023-05-14 DIAGNOSIS — Z809 Family history of malignant neoplasm, unspecified: Secondary | ICD-10-CM | POA: Insufficient documentation

## 2023-05-14 DIAGNOSIS — D472 Monoclonal gammopathy: Secondary | ICD-10-CM | POA: Insufficient documentation

## 2023-05-14 DIAGNOSIS — J398 Other specified diseases of upper respiratory tract: Secondary | ICD-10-CM | POA: Insufficient documentation

## 2023-05-14 DIAGNOSIS — M47816 Spondylosis without myelopathy or radiculopathy, lumbar region: Secondary | ICD-10-CM | POA: Diagnosis not present

## 2023-05-14 DIAGNOSIS — Z818 Family history of other mental and behavioral disorders: Secondary | ICD-10-CM | POA: Diagnosis not present

## 2023-05-14 DIAGNOSIS — J941 Fibrothorax: Secondary | ICD-10-CM | POA: Diagnosis not present

## 2023-05-14 DIAGNOSIS — Z79899 Other long term (current) drug therapy: Secondary | ICD-10-CM | POA: Insufficient documentation

## 2023-05-14 DIAGNOSIS — M898X8 Other specified disorders of bone, other site: Secondary | ICD-10-CM | POA: Diagnosis not present

## 2023-05-14 DIAGNOSIS — I700 Atherosclerosis of aorta: Secondary | ICD-10-CM | POA: Insufficient documentation

## 2023-05-14 DIAGNOSIS — C3412 Malignant neoplasm of upper lobe, left bronchus or lung: Secondary | ICD-10-CM | POA: Diagnosis not present

## 2023-05-14 DIAGNOSIS — C7951 Secondary malignant neoplasm of bone: Secondary | ICD-10-CM | POA: Diagnosis not present

## 2023-05-14 DIAGNOSIS — Z9049 Acquired absence of other specified parts of digestive tract: Secondary | ICD-10-CM | POA: Diagnosis not present

## 2023-05-14 DIAGNOSIS — J90 Pleural effusion, not elsewhere classified: Secondary | ICD-10-CM | POA: Diagnosis not present

## 2023-05-14 DIAGNOSIS — Z902 Acquired absence of lung [part of]: Secondary | ICD-10-CM | POA: Insufficient documentation

## 2023-05-14 LAB — COMPREHENSIVE METABOLIC PANEL
ALT: 41 U/L (ref 0–44)
AST: 28 U/L (ref 15–41)
Albumin: 3.7 g/dL (ref 3.5–5.0)
Alkaline Phosphatase: 50 U/L (ref 38–126)
Anion gap: 9 (ref 5–15)
BUN: 29 mg/dL — ABNORMAL HIGH (ref 8–23)
CO2: 25 mmol/L (ref 22–32)
Calcium: 9.3 mg/dL (ref 8.9–10.3)
Chloride: 104 mmol/L (ref 98–111)
Creatinine, Ser: 1.69 mg/dL — ABNORMAL HIGH (ref 0.61–1.24)
GFR, Estimated: 39 mL/min — ABNORMAL LOW (ref >60)
Glucose, Bld: 116 mg/dL — ABNORMAL HIGH (ref 70–99)
Potassium: 5 mmol/L (ref 3.5–5.1)
Sodium: 138 mmol/L (ref 135–145)
Total Bilirubin: 0.3 mg/dL (ref 0.3–1.2)
Total Protein: 7.3 g/dL (ref 6.5–8.1)

## 2023-05-14 LAB — CBC WITH DIFFERENTIAL/PLATELET
Abs Immature Granulocytes: 0.02 10*3/uL (ref 0.00–0.07)
Basophils Absolute: 0 10*3/uL (ref 0.0–0.1)
Basophils Relative: 1 %
Eosinophils Absolute: 0.3 10*3/uL (ref 0.0–0.5)
Eosinophils Relative: 4 %
HCT: 35.7 % — ABNORMAL LOW (ref 39.0–52.0)
Hemoglobin: 11.7 g/dL — ABNORMAL LOW (ref 13.0–17.0)
Immature Granulocytes: 0 %
Lymphocytes Relative: 20 %
Lymphs Abs: 1.4 10*3/uL (ref 0.7–4.0)
MCH: 30.1 pg (ref 26.0–34.0)
MCHC: 32.8 g/dL (ref 30.0–36.0)
MCV: 91.8 fL (ref 80.0–100.0)
Monocytes Absolute: 0.6 10*3/uL (ref 0.1–1.0)
Monocytes Relative: 9 %
Neutro Abs: 4.5 10*3/uL (ref 1.7–7.7)
Neutrophils Relative %: 66 %
Platelets: 166 10*3/uL (ref 150–400)
RBC: 3.89 MIL/uL — ABNORMAL LOW (ref 4.22–5.81)
RDW: 15.7 % — ABNORMAL HIGH (ref 11.5–15.5)
WBC: 6.7 10*3/uL (ref 4.0–10.5)
nRBC: 0 % (ref 0.0–0.2)

## 2023-05-14 LAB — IRON AND TIBC
Iron: 61 ug/dL (ref 45–182)
Saturation Ratios: 22 % (ref 17.9–39.5)
TIBC: 277 ug/dL (ref 250–450)
UIBC: 216 ug/dL

## 2023-05-14 LAB — LACTATE DEHYDROGENASE: LDH: 112 U/L (ref 98–192)

## 2023-05-14 LAB — FERRITIN: Ferritin: 141 ng/mL (ref 24–336)

## 2023-05-14 LAB — FOLATE: Folate: 35.5 ng/mL (ref >5.9)

## 2023-05-14 LAB — VITAMIN B12: Vitamin B-12: 630 pg/mL (ref 180–914)

## 2023-05-21 ENCOUNTER — Inpatient Hospital Stay: Payer: Medicare Other | Admitting: Hematology

## 2023-06-01 DIAGNOSIS — H353211 Exudative age-related macular degeneration, right eye, with active choroidal neovascularization: Secondary | ICD-10-CM | POA: Diagnosis not present

## 2023-06-01 DIAGNOSIS — H353122 Nonexudative age-related macular degeneration, left eye, intermediate dry stage: Secondary | ICD-10-CM | POA: Diagnosis not present

## 2023-06-01 DIAGNOSIS — H43813 Vitreous degeneration, bilateral: Secondary | ICD-10-CM | POA: Diagnosis not present

## 2023-06-01 DIAGNOSIS — H35033 Hypertensive retinopathy, bilateral: Secondary | ICD-10-CM | POA: Diagnosis not present

## 2023-06-02 NOTE — Progress Notes (Signed)
[facility] [address], [place] [address]    Clinic Day:  06/03/2023  Referring physician: Benita Stabile, MD  Patient Care Team: Benita Stabile, MD as PCP - General (Internal Medicine) Mickie Bail, RN as Oncology Nurse Navigator (Oncology) Doreatha Massed, MD as Medical Oncologist (Medical Oncology)   ASSESSMENT & PLAN:   Assessment: 1.  IgM kappa MGUS: -Serum immunofixation shows IgM kappa. -SPEP was negative.  Free kappa light chains of 48.5, lambda light chains 24.8, ratio of 1.96. -Beta-2 microglobulin 3.2.  LDH 105. -Creatinine 1.95 and calcium 9.7. -24-hour urine total protein was 572 mg.  M spike was negative. -Skeletal survey on 03/29/2020 did not show any lytic lesions. -PET scan on 04/09/2020 did not show any skeletal abnormalities.   2.  PT 1 CPN 0 left lung adenocarcinoma: -He quit smoking in 1999, smoked about 46 pack years. -PET scan on 04/09/2020 showed 2.1 x 2.1 cm left apical nodule with SUV 2.2.  Possibility of low-grade adenocarcinoma. -Navigational bronchoscopy and biopsy of the left upper lobe lung lesion on 05/29/2020 consistent with adenocarcinoma. -Left upper lobectomy and lymph node biopsy on 06/22/2020.  Pathology shows invasive adenocarcinoma, 3 cm, no visceral pleural invasion identified.  Resection margins negative.  0/4 hilar lymph nodes negative for carcinoma. - CT chest with contrast on 10/24/2020 shows postoperative changes with moderate sized loculated appearing left pleural effusion with no enhancing pleural nodules.  Stable small scattered mediastinal and hilar lymph nodes.    Plan: 1.  IgM kappa MGUS: - Denies any new onset pains.  Denies any infections. - Labs from 05/14/2023: Calcium 9.3, creatinine 1.69 and stable.  Hemoglobin is 11.7.  M spike is 0.5.  FLC ratio is 1.77 and stable.  Ferritin 141 and percent saturation 22. - Recommend starting iron tablet Monday, Wednesday and Friday with stool softener. - Skeletal  survey on 05/14/2023: No lytic lesions. - No "crab" features.  RTC 6 months with repeat labs.   2.  Left lung adenocarcinoma: - CT chest on 05/14/2023: Left upper lobectomy with no evidence of recurrence.  New peribronchovascular nodularity in the left lower lobe. - He reported having COVID infection in July. - Recommend follow-up in 6 months with repeat CT of the chest.    Orders Placed This Encounter  Procedures   CT Chest Wo Contrast    Standing Status:   Future    Standing Expiration Date:   06/02/2024    Order Specific Question:   Preferred imaging location?    Answer:   [facility]   CBC with Differential    Standing Status:   Future    Standing Expiration Date:   06/02/2024   Comprehensive metabolic panel    Standing Status:   Future    Standing Expiration Date:   06/02/2024   Iron and TIBC ([facility] ONLY)    Standing Status:   Future    Standing Expiration Date:   06/02/2024   Ferritin    Standing Status:   Future    Standing Expiration Date:   06/02/2024   Kappa/lambda light chains    Standing Status:   Future    Standing Expiration Date:   06/02/2024   Protein electrophoresis, serum    Standing Status:   Future    Standing Expiration Date:   06/02/2024   Lactate dehydrogenase    Standing Status:   Future    Standing Expiration Date:   06/02/2024  I,Katie Daubenspeck,acting as a Neurosurgeon for Doreatha Massed, MD.,have documented all relevant documentation on the behalf of Doreatha Massed, MD,as directed by  Doreatha Massed, MD while in the presence of Doreatha Massed, MD.   I, Doreatha Massed MD, have reviewed the above documentation for accuracy and completeness, and I agree with the above.   Doreatha Massed, MD   8/28/20245:28 PM  CHIEF COMPLAINT:   Diagnosis: left lung cancer and MGUS    Cancer Staging  Adenocarcinoma of left lung, stage 1 (HCC) Staging form: Lung, AJCC 8th Edition - Clinical stage from  06/26/2020: Stage IA3 (cT1c, cN0, cM0) - Signed by Loreli Slot, MD on 06/26/2020    Prior Therapy: Left upper lung lobectomy on 06/22/2020   Current Therapy:  surveillance   HISTORY OF PRESENT ILLNESS:   Oncology History  Adenocarcinoma of left lung, stage 1 (HCC)  06/26/2020 Initial Diagnosis   Adenocarcinoma of left lung, stage 1 (HCC)   06/26/2020 Cancer Staging   Staging form: Lung, AJCC 8th Edition - Clinical stage from 06/26/2020: Stage IA3 (cT1c, cN0, cM0) - Signed by Loreli Slot, MD on 06/26/2020      INTERVAL HISTORY:   William Wells is a 89 y.o. male presenting to clinic today for follow up of left lung cancer and MGUS. He was last seen by me on 11/20/22.  Since his last visit, he underwent surveillance chest CT on 05/14/23 showing: no evidence of recurrent or metastatic disease; new peribronchovascular nodularity in LLL, indicative of infectious bronchiolitis.   He also underwent bone metastasis survey the same day showing: no lytic bone lesions; extensive degenerative changes to spine.  Today, he states that he is doing well overall. His appetite level is at 100%. His energy level is at 50%.  PAST MEDICAL HISTORY:   Past Medical History: Past Medical History:  Diagnosis Date   Acid reflux disease    Arthritis    Complication of anesthesia    appendix surgery (89 year old) - passed out once patient got back to floor unit   Diabetes mellitus    Hiatal hernia    High cholesterol    Hypertension    Hypothyroidism    Monoclonal gammopathy of unknown significance (MGUS) 03/28/2020   Normocytic anemia 03/28/2020   Renal disorder    renal insufficiency   Type 2 diabetes mellitus (HCC)     Surgical History: Past Surgical History:  Procedure Laterality Date   APPENDECTOMY     BRONCHIAL BIOPSY  05/29/2020   Procedure: BRONCHIAL BIOPSIES;  Surgeon: Leslye Peer, MD;  Location: [facility] ENDOSCOPY;  Service: Pulmonary;;   BRONCHIAL BRUSHINGS  05/29/2020    Procedure: BRONCHIAL BRUSHINGS;  Surgeon: Leslye Peer, MD;  Location[facility] ENDOSCOPY;  Service: Pulmonary;;   BRONCHIAL NEEDLE ASPIRATION BIOPSY  05/29/2020   Procedure: BRONCHIAL NEEDLE ASPIRATION BIOPSIES;  Surgeon: Leslye Peer, MD;  Location: [facility] ENDOSCOPY;  Service: Pulmonary;;   BRONCHIAL WASHINGS  05/29/2020   Procedure: BRONCHIAL WASHINGS;  Surgeon: Leslye Peer, MD;  Location: [facility] ENDOSCOPY;  Service: Pulmonary;;   CATARACT EXTRACTION W/PHACO Left 01/23/2023   Procedure: CATARACT EXTRACTION PHACO AND INTRAOCULAR LENS PLACEMENT (IOC);  Surgeon: Fabio Pierce, MD;  Location: [facility] ORS;  Service: Ophthalmology;  Laterality: Left;  CDE: 13.69   CATARACT EXTRACTION W/PHACO Right 02/20/2023   Procedure: CATARACT EXTRACTION PHACO AND INTRAOCULAR LENS PLACEMENT (IOC);  Surgeon: Fabio Pierce, MD;  Location: [facility] ORS;  Service: Ophthalmology;  Laterality: Right;  CDE: 12.88   EYE SURGERY  INTERCOSTAL NERVE BLOCK Left 06/22/2020   Procedure: INTERCOSTAL NERVE BLOCK;  Surgeon: Loreli Slot, MD;  Location: [facility] OR;  Service: Thoracic;  Laterality: Left;   NODE DISSECTION Left 06/22/2020   Procedure: NODE DISSECTION;  Surgeon: Loreli Slot, MD;  Location[facility] OR;  Service: Thoracic;  Laterality: Left;   TONSILLECTOMY     VIDEO BRONCHOSCOPY WITH ENDOBRONCHIAL NAVIGATION N/A 05/29/2020   Procedure: VIDEO BRONCHOSCOPY WITH ENDOBRONCHIAL NAVIGATION;  Surgeon: Leslye Peer, MD;  Location: [facility] ENDOSCOPY;  Service: Pulmonary;  Laterality: N/A;    Social History: Social History   Socioeconomic History   Marital status: Married    Spouse name: Not on file   Number of children: 4   Years of education: Not on file   Highest education level: Not on file  Occupational History   Occupation: RETIRED  Tobacco Use   Smoking status: Former    Current packs/day: 0.00    Average packs/day: 0.5 packs/day for 47.0 years (23.5 ttl pk-yrs)    Types: Cigarettes    Start date: 83    Quit  date: 2000    Years since quitting: 24.6   Smokeless tobacco: Never  Vaping Use   Vaping status: Never Used  Substance and Sexual Activity   Alcohol use: No   Drug use: No   Sexual activity: Not Currently  Other Topics Concern   Not on file  Social History Narrative   Not on file   Social Determinants of Health   Financial Resource Strain: Low Risk  (10/25/2020)   Overall Financial Resource Strain (CARDIA)    Difficulty of Paying Living Expenses: Not hard at all  Food Insecurity: No Food Insecurity (10/25/2020)   Hunger Vital Sign    Worried About Running Out of Food in the Last Year: Never true    Ran Out of Food in the Last Year: Never true  Transportation Needs: No Transportation Needs (10/25/2020)   PRAPARE - Administrator, Civil Service (Medical): No    Lack of Transportation (Non-Medical): No  Physical Activity: Sufficiently Active (10/25/2020)   Exercise Vital Sign    Days of Exercise per Week: 7 days    Minutes of Exercise per Session: 30 min  Stress: No Stress Concern Present (10/25/2020)   [employer] of Occupational Health - Occupational Stress Questionnaire    Feeling of Stress : Only a little  Social Connections: Moderately Integrated (10/25/2020)   Social Connection and Isolation Panel [NHANES]    Frequency of Communication with Friends and Family: More than three times a week    Frequency of Social Gatherings with Friends and Family: More than three times a week    Attends Religious Services: More than 4 times per year    Active Member of [employer] or Organizations: No    Attends Banker Meetings: Not on file    Marital Status: Married  Catering manager Violence: Not At Risk (10/25/2020)   Humiliation, Afraid, Rape, and Kick questionnaire    Fear of Current or Ex-Partner: No    Emotionally Abused: No    Physically Abused: No    Sexually Abused: No    Family History: Family History  Problem Relation Age of Onset   Diabetes Mother     Cancer Father    Cancer Sister    Diabetes Brother    Cancer Brother    Diabetes Brother    Parkinson's disease Brother    Diabetes Brother     Current Medications:  Current Outpatient Medications:    acetaminophen (TYLENOL) 500 MG tablet, Take 2 tablets (1,000 mg total) by mouth every 6 (six) hours., Disp: 30 tablet, Rfl: 0   docusate sodium (COLACE) 100 MG capsule, Take 1 capsule (100 mg total) by mouth 2 (two) times daily as needed for mild constipation., Disp: 10 capsule, Rfl: 0   gabapentin (NEURONTIN) 300 MG capsule, Take 300 mg by mouth 3 (three) times daily., Disp: , Rfl:    glipiZIDE (GLUCOTROL XL) 2.5 MG 24 hr tablet, Take 2.5 mg by mouth daily as needed (blood sugar above 160). , Disp: , Rfl:    HYDROcodone-acetaminophen (NORCO/VICODIN) 5-325 MG tablet, Take 1 tablet by mouth every 8 (eight) hours as needed., Disp: 15 tablet, Rfl: 0   Lancets (ONETOUCH DELICA PLUS LANCET30G) MISC, 2 (two) times daily., Disp: , Rfl:    levothyroxine (SYNTHROID) 75 MCG tablet, Take 75 mcg by mouth daily., Disp: , Rfl:    LOKELMA 5 g packet, Take by mouth., Disp: , Rfl:    Multiple Vitamin (MULTIVITAMIN ADULT PO), Take by mouth., Disp: , Rfl:    Multiple Vitamins-Minerals (PRESERVISION AREDS PO), Take by mouth., Disp: , Rfl:    omeprazole (PRILOSEC) 20 MG capsule, Take 20 mg by mouth daily., Disp: , Rfl:    ONETOUCH ULTRA test strip, 2 (two) times daily., Disp: , Rfl:    oxyCODONE-acetaminophen (PERCOCET) 5-325 MG tablet, Take 1 tablet by mouth every 6 (six) hours as needed., Disp: 10 tablet, Rfl: 0   rosuvastatin (CRESTOR) 40 MG tablet, Take 40 mg by mouth daily. , Disp: , Rfl:    telmisartan (MICARDIS) 20 MG tablet, Take 10 mg by mouth daily., Disp: , Rfl:    VITAMIN D PO, Take 2,000 Int'l Units by mouth daily., Disp: , Rfl:    VITAMIN E PO, Take by mouth., Disp: , Rfl:    Allergies: No Known Allergies  REVIEW OF SYSTEMS:   Review of Systems  Constitutional:  Negative for chills,  fatigue and fever.  HENT:   Negative for lump/mass, mouth sores, nosebleeds, sore throat and trouble swallowing.   Eyes:  Negative for eye problems.  Respiratory:  Positive for cough. Negative for shortness of breath.   Cardiovascular:  Negative for chest pain, leg swelling and palpitations.  Gastrointestinal:  Positive for constipation and diarrhea. Negative for abdominal pain, nausea and vomiting.  Genitourinary:  Negative for bladder incontinence, difficulty urinating, dysuria, frequency, hematuria and nocturia.   Musculoskeletal:  Negative for arthralgias, back pain, flank pain, myalgias and neck pain.  Skin:  Negative for itching and rash.  Neurological:  Positive for headaches and numbness. Negative for dizziness.  Hematological:  Does not bruise/bleed easily.  Psychiatric/Behavioral:  Negative for depression, sleep disturbance and suicidal ideas. The patient is not nervous/anxious.   All other systems reviewed and are negative.    VITALS:   Blood pressure 122/61, pulse 76, temperature (!) 97.5 F (36.4 C), temperature source Oral, resp. rate 16, weight 167 lb (75.8 kg), SpO2 99%.  Wt Readings from Last 3 Encounters:  06/03/23 167 lb (75.8 kg)  02/20/23 154 lb 1.6 oz (69.9 kg)  02/16/23 154 lb 1.6 oz (69.9 kg)    Body mass index is 25.39 kg/m.  Performance status (ECOG): 1 - Symptomatic but completely ambulatory  PHYSICAL EXAM:   Physical Exam Vitals and nursing note reviewed. Exam conducted with a chaperone present.  Constitutional:      Appearance: Normal appearance.  Cardiovascular:     Rate and Rhythm:  Normal rate and regular rhythm.     Pulses: Normal pulses.     Heart sounds: Normal heart sounds.  Pulmonary:     Effort: Pulmonary effort is normal.     Breath sounds: Normal breath sounds.  Abdominal:     Palpations: Abdomen is soft. There is no hepatomegaly, splenomegaly or mass.     Tenderness: There is no abdominal tenderness.  Musculoskeletal:     Right  lower leg: No edema.     Left lower leg: No edema.  Lymphadenopathy:     Cervical: No cervical adenopathy.     Right cervical: No superficial, deep or posterior cervical adenopathy.    Left cervical: No superficial, deep or posterior cervical adenopathy.     Upper Body:     Right upper body: No supraclavicular or axillary adenopathy.     Left upper body: No supraclavicular or axillary adenopathy.  Neurological:     General: No focal deficit present.     Mental Status: He is alert and oriented to person, place, and time.  Psychiatric:        Mood and Affect: Mood normal.        Behavior: Behavior normal.     LABS:      Latest Ref Rng & Units 05/14/2023    1:42 PM 11/13/2022   10:36 AM 05/09/2022    2:42 PM  CBC  WBC 4.0 - 10.5 K/uL 6.7  5.9  6.6   Hemoglobin 13.0 - 17.0 g/dL 96.2  95.2  84.1   Hematocrit 39.0 - 52.0 % 35.7  37.4  35.0   Platelets 150 - 400 K/uL 166  139  153       Latest Ref Rng & Units 05/14/2023    1:42 PM 11/13/2022   10:36 AM 05/09/2022    2:42 PM  CMP  Glucose 70 - 99 mg/dL 324  98  401   BUN 8 - 23 mg/dL 29  36  34   Creatinine 0.61 - 1.24 mg/dL 0.27  2.53  6.64   Sodium 135 - 145 mmol/L 138  137  139   Potassium 3.5 - 5.1 mmol/L 5.0  5.3  4.8   Chloride 98 - 111 mmol/L 104  104  106   CO2 22 - 32 mmol/L 25  26  27    Calcium 8.9 - 10.3 mg/dL 9.3  9.4  9.2   Total Protein 6.5 - 8.1 g/dL 7.3  7.8  7.5   Total Bilirubin 0.3 - 1.2 mg/dL 0.3  0.4  0.7   Alkaline Phos 38 - 126 U/L 50  43  45   AST 15 - 41 U/L 28  18  18    ALT 0 - 44 U/L 41  20  26      No results found for: "CEA1", "CEA" / No results found for: "CEA1", "CEA" No results found for: "PSA1" No results found for: "CAN199" No results found for: "CAN125"  Lab Results  Component Value Date   TOTALPROTELP 6.7 05/14/2023   ALBUMINELP 3.6 05/14/2023   A1GS 0.3 05/14/2023   A2GS 0.8 05/14/2023   BETS 0.8 05/14/2023   GAMS 1.3 05/14/2023   MSPIKE 0.5 (H) 05/14/2023   SPEI Comment 05/14/2023    Lab Results  Component Value Date   TIBC 277 05/14/2023   TIBC 308 11/13/2022   FERRITIN 141 05/14/2023   FERRITIN 66 11/13/2022   IRONPCTSAT 22 05/14/2023   IRONPCTSAT 17 (L) 11/13/2022   Lab Results  Component Value Date   LDH 112 05/14/2023   LDH 109 11/13/2022   LDH 105 03/28/2020     STUDIES:   DG Bone Survey Met  Result Date: 05/21/2023 CLINICAL DATA:  MGUS previous left upper lobectomy for non-small cell lung cancer. EXAM: METASTATIC BONE SURVEY COMPARISON:  Pertinent previous examinations, including the chest CT dated 11/13/2022. FINDINGS: Extensive cervical, thoracic and lumbar spine degenerative changes with changes of DISH. Mild dextroconvex lumbar rotary scoliosis. Atheromatous arterial calcifications. Left hemithorax with surgical changes. Associated volume loss and tracheal deviation to the left. Bilateral knee degenerative changes. Small bone island in each glenoid. No lytic bone lesions. IMPRESSION: 1. No lytic bone lesions. 2. Extensive cervical, thoracic and lumbar spine degenerative changes with changes of DISH. Electronically Signed   By: Beckie Salts M.D.   On: 05/21/2023 14:13   CT Chest Wo Contrast  Result Date: 05/20/2023 CLINICAL DATA:  Non-small cell lung cancer and monoclonal gammopathy of undetermined significance. * Tracking Code: BO * EXAM: CT CHEST WITHOUT CONTRAST TECHNIQUE: Multidetector CT imaging of the chest was performed following the standard protocol without IV contrast. RADIATION DOSE REDUCTION: This exam was performed according to the departmental dose-optimization program which includes automated exposure control, adjustment of the mA and/or kV according to patient size and/or use of iterative reconstruction technique. COMPARISON:  11/13/2022. FINDINGS: Cardiovascular: Atherosclerotic calcification of the aorta and coronary arteries. Heart is at the upper limits of normal in size. No pericardial effusion. Mediastinum/Nodes: No pathologically  enlarged mediastinal or axillary lymph nodes. Hilar regions are difficult to definitively evaluate without IV contrast. There may be distal esophageal wall thickening which can be seen with gastroesophageal reflux. Lungs/Pleura: Centrilobular emphysema. Left upper lobectomy. New peribronchovascular nodularity and surrounding ground-glass in the left lower lobe. Small left fibrothorax. Lungs are otherwise clear. Airway is otherwise unremarkable. Upper Abdomen: Visualized portions of the liver, gallbladder, adrenal glands, kidneys, spleen, pancreas, stomach and bowel are grossly unremarkable. No upper abdominal adenopathy. Musculoskeletal: Degenerative changes in the spine. No worrisome lytic or sclerotic lesions. IMPRESSION: 1. Left upper lobectomy. No evidence of recurrent or metastatic disease. 2. New peribronchovascular nodularity in the left lower lobe, indicative of an infectious bronchiolitis. 3. Aortic atherosclerosis (ICD10-I70.0). Coronary artery calcification. 4.  Emphysema (ICD10-J43.9). Electronically Signed   By: Leanna Battles M.D.   On: 05/20/2023 09:22

## 2023-06-03 ENCOUNTER — Inpatient Hospital Stay: Payer: Medicare Other | Admitting: Hematology

## 2023-06-03 VITALS — BP 122/61 | HR 76 | Temp 97.5°F | Resp 16 | Wt 167.0 lb

## 2023-06-03 DIAGNOSIS — R809 Proteinuria, unspecified: Secondary | ICD-10-CM | POA: Diagnosis not present

## 2023-06-03 DIAGNOSIS — Z9049 Acquired absence of other specified parts of digestive tract: Secondary | ICD-10-CM | POA: Diagnosis not present

## 2023-06-03 DIAGNOSIS — Z87891 Personal history of nicotine dependence: Secondary | ICD-10-CM | POA: Diagnosis not present

## 2023-06-03 DIAGNOSIS — D472 Monoclonal gammopathy: Secondary | ICD-10-CM | POA: Diagnosis not present

## 2023-06-03 DIAGNOSIS — Z902 Acquired absence of lung [part of]: Secondary | ICD-10-CM | POA: Diagnosis not present

## 2023-06-03 DIAGNOSIS — J90 Pleural effusion, not elsewhere classified: Secondary | ICD-10-CM | POA: Diagnosis not present

## 2023-06-03 DIAGNOSIS — Z833 Family history of diabetes mellitus: Secondary | ICD-10-CM | POA: Diagnosis not present

## 2023-06-03 DIAGNOSIS — M170 Bilateral primary osteoarthritis of knee: Secondary | ICD-10-CM | POA: Diagnosis not present

## 2023-06-03 DIAGNOSIS — E1129 Type 2 diabetes mellitus with other diabetic kidney complication: Secondary | ICD-10-CM | POA: Diagnosis not present

## 2023-06-03 DIAGNOSIS — Z809 Family history of malignant neoplasm, unspecified: Secondary | ICD-10-CM | POA: Diagnosis not present

## 2023-06-03 DIAGNOSIS — Z79899 Other long term (current) drug therapy: Secondary | ICD-10-CM | POA: Diagnosis not present

## 2023-06-03 DIAGNOSIS — E039 Hypothyroidism, unspecified: Secondary | ICD-10-CM | POA: Diagnosis not present

## 2023-06-03 DIAGNOSIS — I129 Hypertensive chronic kidney disease with stage 1 through stage 4 chronic kidney disease, or unspecified chronic kidney disease: Secondary | ICD-10-CM | POA: Diagnosis not present

## 2023-06-03 DIAGNOSIS — J941 Fibrothorax: Secondary | ICD-10-CM | POA: Diagnosis not present

## 2023-06-03 DIAGNOSIS — J398 Other specified diseases of upper respiratory tract: Secondary | ICD-10-CM | POA: Diagnosis not present

## 2023-06-03 DIAGNOSIS — M47816 Spondylosis without myelopathy or radiculopathy, lumbar region: Secondary | ICD-10-CM | POA: Diagnosis not present

## 2023-06-03 DIAGNOSIS — C3492 Malignant neoplasm of unspecified part of left bronchus or lung: Secondary | ICD-10-CM

## 2023-06-03 DIAGNOSIS — C3412 Malignant neoplasm of upper lobe, left bronchus or lung: Secondary | ICD-10-CM | POA: Diagnosis not present

## 2023-06-03 DIAGNOSIS — E559 Vitamin D deficiency, unspecified: Secondary | ICD-10-CM | POA: Diagnosis not present

## 2023-06-03 DIAGNOSIS — E1122 Type 2 diabetes mellitus with diabetic chronic kidney disease: Secondary | ICD-10-CM | POA: Diagnosis not present

## 2023-06-03 DIAGNOSIS — I700 Atherosclerosis of aorta: Secondary | ICD-10-CM | POA: Diagnosis not present

## 2023-06-03 DIAGNOSIS — N1832 Chronic kidney disease, stage 3b: Secondary | ICD-10-CM | POA: Diagnosis not present

## 2023-06-03 DIAGNOSIS — I2510 Atherosclerotic heart disease of native coronary artery without angina pectoris: Secondary | ICD-10-CM | POA: Diagnosis not present

## 2023-06-03 DIAGNOSIS — J432 Centrilobular emphysema: Secondary | ICD-10-CM | POA: Diagnosis not present

## 2023-06-03 DIAGNOSIS — Z9089 Acquired absence of other organs: Secondary | ICD-10-CM | POA: Diagnosis not present

## 2023-06-03 DIAGNOSIS — C7951 Secondary malignant neoplasm of bone: Secondary | ICD-10-CM | POA: Diagnosis not present

## 2023-06-03 DIAGNOSIS — N189 Chronic kidney disease, unspecified: Secondary | ICD-10-CM | POA: Diagnosis not present

## 2023-06-03 NOTE — Patient Instructions (Addendum)
[facility] at [facility] Discharge Instructions   You were seen and examined today by Dr. Ellin Saba.  He reviewed the results of your lab work which is normal/stable.   He reviewed the results of your CT scan. There is no evidence of cancer on this exam.   He reviewed the results of your bone xrays which did not show any lesions on the bones.   We will see you back in 6 months. We will repeat lab work and a scan prior to your next visit.  Return as scheduled.    Thank you for choosing [facility] at [facility] to provide your oncology and hematology care.  To afford each patient quality time with our provider, please arrive at least 15 minutes before your scheduled appointment time.   If you have a lab appointment with the Cancer Center please come in thru the Main Entrance and check in at the main information desk.  You need to re-schedule your appointment should you arrive 10 or more minutes late.  We strive to give you quality time with our providers, and arriving late affects you and other patients whose appointments are after yours.  Also, if you no show three or more times for appointments you may be dismissed from the clinic at the providers discretion.     Again, thank you for choosing [facility].  Our hope is that these requests will decrease the amount of time that you wait before being seen by our physicians.       _____________________________________________________________  Should you have questions after your visit to [facility], please contact our office at [phone] and follow the prompts.  Our office hours are 8:00 a.m. and 4:30 p.m. Monday - Friday.  Please note that voicemails left after 4:00 p.m. may not be returned until the following business day.  We are closed weekends and major holidays.  You do have access to a nurse 24-7, just call the main number to the clinic [phone] and do not  press any options, hold on the line and a nurse will answer the phone.    For prescription refill requests, have your pharmacy contact our office and allow 72 hours.    Due to Covid, you will need to wear a mask upon entering the hospital. If you do not have a mask, a mask will be given to you at the Main Entrance upon arrival. For doctor visits, patients may have 1 support person age 53 or older with them. For treatment visits, patients can not have anyone with them due to social distancing guidelines and our immunocompromised population.

## 2023-06-04 DIAGNOSIS — H353211 Exudative age-related macular degeneration, right eye, with active choroidal neovascularization: Secondary | ICD-10-CM | POA: Diagnosis not present

## 2023-06-04 DIAGNOSIS — H524 Presbyopia: Secondary | ICD-10-CM | POA: Diagnosis not present

## 2023-06-04 DIAGNOSIS — H353122 Nonexudative age-related macular degeneration, left eye, intermediate dry stage: Secondary | ICD-10-CM | POA: Diagnosis not present

## 2023-06-10 DIAGNOSIS — E1129 Type 2 diabetes mellitus with other diabetic kidney complication: Secondary | ICD-10-CM | POA: Diagnosis not present

## 2023-06-10 DIAGNOSIS — N1832 Chronic kidney disease, stage 3b: Secondary | ICD-10-CM | POA: Diagnosis not present

## 2023-06-10 DIAGNOSIS — R809 Proteinuria, unspecified: Secondary | ICD-10-CM | POA: Diagnosis not present

## 2023-06-10 DIAGNOSIS — D638 Anemia in other chronic diseases classified elsewhere: Secondary | ICD-10-CM | POA: Diagnosis not present

## 2023-06-24 DIAGNOSIS — E1122 Type 2 diabetes mellitus with diabetic chronic kidney disease: Secondary | ICD-10-CM | POA: Diagnosis not present

## 2023-06-24 DIAGNOSIS — D472 Monoclonal gammopathy: Secondary | ICD-10-CM | POA: Diagnosis not present

## 2023-06-24 DIAGNOSIS — E782 Mixed hyperlipidemia: Secondary | ICD-10-CM | POA: Diagnosis not present

## 2023-06-24 DIAGNOSIS — M109 Gout, unspecified: Secondary | ICD-10-CM | POA: Diagnosis not present

## 2023-06-24 DIAGNOSIS — N1832 Chronic kidney disease, stage 3b: Secondary | ICD-10-CM | POA: Diagnosis not present

## 2023-06-24 DIAGNOSIS — E039 Hypothyroidism, unspecified: Secondary | ICD-10-CM | POA: Diagnosis not present

## 2023-06-24 DIAGNOSIS — Z85118 Personal history of other malignant neoplasm of bronchus and lung: Secondary | ICD-10-CM | POA: Diagnosis not present

## 2023-06-24 DIAGNOSIS — I129 Hypertensive chronic kidney disease with stage 1 through stage 4 chronic kidney disease, or unspecified chronic kidney disease: Secondary | ICD-10-CM | POA: Diagnosis not present

## 2023-06-24 DIAGNOSIS — R519 Headache, unspecified: Secondary | ICD-10-CM | POA: Diagnosis not present

## 2023-06-24 DIAGNOSIS — E559 Vitamin D deficiency, unspecified: Secondary | ICD-10-CM | POA: Diagnosis not present

## 2023-06-24 DIAGNOSIS — K219 Gastro-esophageal reflux disease without esophagitis: Secondary | ICD-10-CM | POA: Diagnosis not present

## 2023-07-07 DIAGNOSIS — Z23 Encounter for immunization: Secondary | ICD-10-CM | POA: Diagnosis not present

## 2023-07-13 DIAGNOSIS — H353211 Exudative age-related macular degeneration, right eye, with active choroidal neovascularization: Secondary | ICD-10-CM | POA: Diagnosis not present

## 2023-08-10 DIAGNOSIS — H353122 Nonexudative age-related macular degeneration, left eye, intermediate dry stage: Secondary | ICD-10-CM | POA: Diagnosis not present

## 2023-08-10 DIAGNOSIS — H43813 Vitreous degeneration, bilateral: Secondary | ICD-10-CM | POA: Diagnosis not present

## 2023-08-10 DIAGNOSIS — H3581 Retinal edema: Secondary | ICD-10-CM | POA: Diagnosis not present

## 2023-08-10 DIAGNOSIS — H35033 Hypertensive retinopathy, bilateral: Secondary | ICD-10-CM | POA: Diagnosis not present

## 2023-08-10 DIAGNOSIS — H353211 Exudative age-related macular degeneration, right eye, with active choroidal neovascularization: Secondary | ICD-10-CM | POA: Diagnosis not present

## 2023-08-24 DIAGNOSIS — H3581 Retinal edema: Secondary | ICD-10-CM | POA: Diagnosis not present

## 2023-08-24 DIAGNOSIS — H353211 Exudative age-related macular degeneration, right eye, with active choroidal neovascularization: Secondary | ICD-10-CM | POA: Diagnosis not present

## 2023-09-08 ENCOUNTER — Other Ambulatory Visit (HOSPITAL_COMMUNITY): Payer: Self-pay | Admitting: Family Medicine

## 2023-09-08 DIAGNOSIS — Z23 Encounter for immunization: Secondary | ICD-10-CM | POA: Diagnosis not present

## 2023-09-08 DIAGNOSIS — R0600 Dyspnea, unspecified: Secondary | ICD-10-CM | POA: Diagnosis not present

## 2023-09-08 DIAGNOSIS — R002 Palpitations: Secondary | ICD-10-CM | POA: Diagnosis not present

## 2023-09-08 DIAGNOSIS — I440 Atrioventricular block, first degree: Secondary | ICD-10-CM | POA: Diagnosis not present

## 2023-09-08 DIAGNOSIS — R0602 Shortness of breath: Secondary | ICD-10-CM | POA: Diagnosis not present

## 2023-09-11 DIAGNOSIS — D638 Anemia in other chronic diseases classified elsewhere: Secondary | ICD-10-CM | POA: Diagnosis not present

## 2023-09-11 DIAGNOSIS — N189 Chronic kidney disease, unspecified: Secondary | ICD-10-CM | POA: Diagnosis not present

## 2023-09-11 DIAGNOSIS — E1129 Type 2 diabetes mellitus with other diabetic kidney complication: Secondary | ICD-10-CM | POA: Diagnosis not present

## 2023-09-11 DIAGNOSIS — N1832 Chronic kidney disease, stage 3b: Secondary | ICD-10-CM | POA: Diagnosis not present

## 2023-09-11 DIAGNOSIS — R809 Proteinuria, unspecified: Secondary | ICD-10-CM | POA: Diagnosis not present

## 2023-09-16 DIAGNOSIS — D638 Anemia in other chronic diseases classified elsewhere: Secondary | ICD-10-CM | POA: Diagnosis not present

## 2023-09-16 DIAGNOSIS — E1129 Type 2 diabetes mellitus with other diabetic kidney complication: Secondary | ICD-10-CM | POA: Diagnosis not present

## 2023-09-16 DIAGNOSIS — N1832 Chronic kidney disease, stage 3b: Secondary | ICD-10-CM | POA: Diagnosis not present

## 2023-09-16 DIAGNOSIS — R809 Proteinuria, unspecified: Secondary | ICD-10-CM | POA: Diagnosis not present

## 2023-09-17 ENCOUNTER — Ambulatory Visit (HOSPITAL_COMMUNITY)
Admission: RE | Admit: 2023-09-17 | Discharge: 2023-09-17 | Disposition: A | Discharge status: Home / Self Care | Payer: Medicare Other | Source: Ambulatory Visit | Attending: Family Medicine | Admitting: Family Medicine

## 2023-09-17 DIAGNOSIS — R0609 Other forms of dyspnea: Secondary | ICD-10-CM | POA: Diagnosis not present

## 2023-09-17 DIAGNOSIS — R0600 Dyspnea, unspecified: Secondary | ICD-10-CM | POA: Diagnosis not present

## 2023-09-17 LAB — ECHOCARDIOGRAM COMPLETE
Area-P 1/2: 3.6 cm2
S' Lateral: 2.6 cm

## 2023-09-17 NOTE — Progress Notes (Signed)
*  PRELIMINARY RESULTS* Echocardiogram 2D Echocardiogram has been performed.  Stacey Drain 09/17/2023, 10:21 AM

## 2023-09-21 DIAGNOSIS — H35033 Hypertensive retinopathy, bilateral: Secondary | ICD-10-CM | POA: Diagnosis not present

## 2023-09-21 DIAGNOSIS — H353122 Nonexudative age-related macular degeneration, left eye, intermediate dry stage: Secondary | ICD-10-CM | POA: Diagnosis not present

## 2023-09-21 DIAGNOSIS — H3581 Retinal edema: Secondary | ICD-10-CM | POA: Diagnosis not present

## 2023-09-21 DIAGNOSIS — H43813 Vitreous degeneration, bilateral: Secondary | ICD-10-CM | POA: Diagnosis not present

## 2023-09-21 DIAGNOSIS — H353211 Exudative age-related macular degeneration, right eye, with active choroidal neovascularization: Secondary | ICD-10-CM | POA: Diagnosis not present

## 2023-10-21 DIAGNOSIS — E559 Vitamin D deficiency, unspecified: Secondary | ICD-10-CM | POA: Diagnosis not present

## 2023-10-21 DIAGNOSIS — E1122 Type 2 diabetes mellitus with diabetic chronic kidney disease: Secondary | ICD-10-CM | POA: Diagnosis not present

## 2023-10-21 DIAGNOSIS — E039 Hypothyroidism, unspecified: Secondary | ICD-10-CM | POA: Diagnosis not present

## 2023-10-28 DIAGNOSIS — E782 Mixed hyperlipidemia: Secondary | ICD-10-CM | POA: Diagnosis not present

## 2023-10-28 DIAGNOSIS — N1832 Chronic kidney disease, stage 3b: Secondary | ICD-10-CM | POA: Diagnosis not present

## 2023-10-28 DIAGNOSIS — I10 Essential (primary) hypertension: Secondary | ICD-10-CM | POA: Diagnosis not present

## 2023-10-28 DIAGNOSIS — E039 Hypothyroidism, unspecified: Secondary | ICD-10-CM | POA: Diagnosis not present

## 2023-10-28 DIAGNOSIS — R0600 Dyspnea, unspecified: Secondary | ICD-10-CM | POA: Diagnosis not present

## 2023-10-28 DIAGNOSIS — N182 Chronic kidney disease, stage 2 (mild): Secondary | ICD-10-CM | POA: Diagnosis not present

## 2023-10-28 DIAGNOSIS — R519 Headache, unspecified: Secondary | ICD-10-CM | POA: Diagnosis not present

## 2023-10-28 DIAGNOSIS — R002 Palpitations: Secondary | ICD-10-CM | POA: Diagnosis not present

## 2023-10-28 DIAGNOSIS — K219 Gastro-esophageal reflux disease without esophagitis: Secondary | ICD-10-CM | POA: Diagnosis not present

## 2023-10-28 DIAGNOSIS — E1122 Type 2 diabetes mellitus with diabetic chronic kidney disease: Secondary | ICD-10-CM | POA: Diagnosis not present

## 2023-10-28 DIAGNOSIS — M109 Gout, unspecified: Secondary | ICD-10-CM | POA: Diagnosis not present

## 2023-10-28 DIAGNOSIS — I440 Atrioventricular block, first degree: Secondary | ICD-10-CM | POA: Diagnosis not present

## 2023-11-01 ENCOUNTER — Encounter (HOSPITAL_COMMUNITY): Payer: Self-pay

## 2023-11-01 ENCOUNTER — Emergency Department (HOSPITAL_COMMUNITY): Payer: Medicare Other

## 2023-11-01 ENCOUNTER — Emergency Department (HOSPITAL_COMMUNITY)
Admission: EM | Admit: 2023-11-01 | Discharge: 2023-11-01 | Disposition: A | Discharge status: Home / Self Care | Payer: Medicare Other | Attending: Emergency Medicine | Admitting: Emergency Medicine

## 2023-11-01 ENCOUNTER — Other Ambulatory Visit: Payer: Self-pay

## 2023-11-01 DIAGNOSIS — E1122 Type 2 diabetes mellitus with diabetic chronic kidney disease: Secondary | ICD-10-CM | POA: Diagnosis not present

## 2023-11-01 DIAGNOSIS — M5030 Other cervical disc degeneration, unspecified cervical region: Secondary | ICD-10-CM | POA: Diagnosis not present

## 2023-11-01 DIAGNOSIS — M8580 Other specified disorders of bone density and structure, unspecified site: Secondary | ICD-10-CM | POA: Diagnosis not present

## 2023-11-01 DIAGNOSIS — N189 Chronic kidney disease, unspecified: Secondary | ICD-10-CM | POA: Diagnosis not present

## 2023-11-01 DIAGNOSIS — M542 Cervicalgia: Secondary | ICD-10-CM | POA: Insufficient documentation

## 2023-11-01 DIAGNOSIS — M549 Dorsalgia, unspecified: Secondary | ICD-10-CM

## 2023-11-01 DIAGNOSIS — M47812 Spondylosis without myelopathy or radiculopathy, cervical region: Secondary | ICD-10-CM | POA: Diagnosis not present

## 2023-11-01 DIAGNOSIS — M546 Pain in thoracic spine: Secondary | ICD-10-CM | POA: Diagnosis not present

## 2023-11-01 DIAGNOSIS — M47814 Spondylosis without myelopathy or radiculopathy, thoracic region: Secondary | ICD-10-CM | POA: Diagnosis not present

## 2023-11-01 DIAGNOSIS — M545 Low back pain, unspecified: Secondary | ICD-10-CM | POA: Diagnosis not present

## 2023-11-01 DIAGNOSIS — E039 Hypothyroidism, unspecified: Secondary | ICD-10-CM | POA: Diagnosis not present

## 2023-11-01 DIAGNOSIS — I129 Hypertensive chronic kidney disease with stage 1 through stage 4 chronic kidney disease, or unspecified chronic kidney disease: Secondary | ICD-10-CM | POA: Insufficient documentation

## 2023-11-01 MED ORDER — OXYCODONE HCL 5 MG PO TABS: 5.0000 mg | ORAL_TABLET | Freq: Four times a day (QID) | ORAL | 0 refills | Status: DC | PRN

## 2023-11-01 MED ORDER — ACETAMINOPHEN 500 MG PO TABS
1000.0000 mg | ORAL_TABLET | Freq: Once | ORAL | Status: AC
  Administered 2023-11-01: 1000 mg via ORAL
  Filled 2023-11-01: qty 2

## 2023-11-01 MED ORDER — METHOCARBAMOL 500 MG PO TABS: 500.0000 mg | ORAL_TABLET | Freq: Two times a day (BID) | ORAL | 0 refills | Status: DC

## 2023-11-01 MED ORDER — OXYCODONE HCL 5 MG PO TABS
5.0000 mg | ORAL_TABLET | Freq: Once | ORAL | Status: AC
  Administered 2023-11-01: 5 mg via ORAL
  Filled 2023-11-01: qty 1

## 2023-11-01 MED ORDER — METHOCARBAMOL 500 MG PO TABS
500.0000 mg | ORAL_TABLET | Freq: Once | ORAL | Status: AC
  Administered 2023-11-01: 500 mg via ORAL
  Filled 2023-11-01: qty 1

## 2023-11-01 MED ORDER — LIDOCAINE 5 % EX PTCH
1.0000 | MEDICATED_PATCH | CUTANEOUS | Status: DC
  Administered 2023-11-01: 1 via TRANSDERMAL
  Filled 2023-11-01: qty 1

## 2023-11-01 NOTE — ED Triage Notes (Signed)
Patient reports neck and upper back pain x 1 week after laying on a mattress.  Reports pain shoots down both arms and pain is worse with turning his head and  bending.  Took hydrocodone and 300mg  gabapentin prior to arrival.  Denies chest pain or sob.  No weakness noted just pain with movement in extremities and head.

## 2023-11-01 NOTE — ED Notes (Signed)
AVS with prescriptions provided to and discussed with patient and family member at bedside. Pt verbalizes understanding of discharge instructions and denies any questions or concerns at this time. Pt has ride home. Pt taken out of department via W/C.  ? ?

## 2023-11-01 NOTE — ED Provider Notes (Signed)
[place] EMERGENCY DEPARTMENT AT [facility] Provider Note  MDM   HPI/ROS:  William Wells is a 89 y.o. male with pertinent past medical history of chronic musculoskeletal pain, diabetes, HTN, hypothyroidism, CKD who presents for evaluation of neck and arm pain. Patient reports 1 week history of worsening neck, back, shoulder, and arm pain that started after he was getting up from a mattress.  He did not feel any pops when this happened.  He notes the pain has been progressively more severe over this timeframe despite taking hydrocodone and gabapentin at home.  Pain reached a point this morning where he was crying and so his daughter decided to bring him to the ED for further evaluation.  He does note over the past 2-3 years he has experienced intermittent numbness and tingling in the right hand but does not feel this is any worse than normal.  He denies any difficulty urinating, fevers, recent back trauma.  He did have a fall off of a porch 2 years ago and has been dealing with some chronic back pain since then, but feels this is more severe.   Per chart review, patient had MRI of his cervical and thoracic spine which demonstrated likely myelomalacia  Physical exam is notable for: - Full active abduction at the shoulder.  4+/5 strength and right upper extremity.  5/5 strength in left upper extremity though with some decreased active range of motion at the left shoulder.  Mild lower cervical midline tenderness to palpation.  Mild lower thoracic and upper lumbar tenderness to palpation.  Decreased sensation to light touch diffusely over the right upper extremity.  Mild right-sided saddle anesthesia present.  Otherwise 5/5 strength in bilateral lower extremities with sensation intact to light touch throughout.  On my initial evaluation, patient is:  -Vital signs stable. Patient afebrile, hemodynamically stable, and non-toxic appearing. -Additional history obtained from patient's daughter and  review of prior records -Patient with prior MRI cervical and thoracic spine in 04/2023 which demonstrated diffuse degenerative changes with likely myelomalacia  Patient's reported symptoms appear to be musculoskeletal in nature.  He has prior advanced imaging on the back as above demonstrating myelomalacia and although patient relating his current symptoms to episodes standing up from a mattress last week, on further discussion appears he has a longstanding history of severe back and shoulder pain that tends to wax and wane.  They report the reason for his current presentation to the ED is because he was experiencing severe pain this morning that was not initially relieved by gabapentin or hydrocodone, however currently reporting pain ultimately improved after these medications.  He has mildly decreased strength on the right side that appears to be largely pain mediated and has mildly decreased sensation which has been documented previously (see ED note from 09/2021).  No red flag symptoms that would concern for acute cord compression or spinal infection.  X-rays were obtained today and redemonstrating multilevel degenerative disease but no evidence of fracture.  Do not feel advanced imaging indicated at this time.  Attending MD and myself engaged in extensive discussions with patient and his daughter regarding his symptoms.  He was advised on multimodal pain control and after discussing potential risks related to narcotic pain medications and muscle relaxers given age, he would like to proceed with trial of these medications.  Referral was additionally provided for the spine center in addition to advising close follow-up with his PCP.  Patient and daughter are comfortable with this plan.  They were  provided with strict return precautions.     Disposition:  I discussed the plan for discharge with the patient and/or their surrogate at bedside prior to discharge and they were in agreement with the plan and  verbalized understanding of the return precautions provided. All questions answered to the best of my ability. Ultimately, the patient was discharged in stable condition with stable vital signs. I am reassured that they are capable of close follow up and good social support at home.   Clinical Impression:  1. Acute back pain, unspecified back location, unspecified back pain laterality     Rx / DC Orders ED Discharge Orders          Ordered    Ambulatory referral to Spine Surgery        11/01/23 1710    oxyCODONE (ROXICODONE) 5 MG immediate release tablet  Every 6 hours PRN,   Status:  Discontinued        11/01/23 1710    oxyCODONE (ROXICODONE) 5 MG immediate release tablet  Every 6 hours PRN        11/01/23 1746    methocarbamol (ROBAXIN) 500 MG tablet  2 times daily        11/01/23 1746            The plan for this patient was discussed with Dr. Doran Durand, who voiced agreement and who oversaw evaluation and treatment of this patient.   Clinical Complexity A medically appropriate history, review of systems, and physical exam was performed.  My independent interpretations of EKG, labs, and radiology are documented in the ED course above.   If decision rules were used in this patient's evaluation, they are listed below.   Patient's presentation is most consistent with acute presentation with potential threat to life or bodily function.  Medical Decision Making Risk OTC drugs. Prescription drug management.    HPI/ROS      See MDM section for pertinent HPI and ROS. A complete ROS was performed with pertinent positives/negatives noted above.   Past Medical History:  Diagnosis Date   Acid reflux disease    Arthritis    Complication of anesthesia    appendix surgery (89 year old) - passed out once patient got back to floor unit   Diabetes mellitus    Hiatal hernia    High cholesterol    Hypertension    Hypothyroidism    Monoclonal gammopathy of unknown  significance (MGUS) 03/28/2020   Normocytic anemia 03/28/2020   Renal disorder    renal insufficiency   Type 2 diabetes mellitus [facility])     Past Surgical History:  Procedure Laterality Date   APPENDECTOMY     BRONCHIAL BIOPSY  05/29/2020   Procedure: BRONCHIAL BIOPSIES;  Surgeon: Leslye Peer, MD;  Location: [facility] ENDOSCOPY;  Service: Pulmonary;;   BRONCHIAL BRUSHINGS  05/29/2020   Procedure: BRONCHIAL BRUSHINGS;  Surgeon: Leslye Peer, MD;  Location: [facility] ENDOSCOPY;  Service: Pulmonary;;   BRONCHIAL NEEDLE ASPIRATION BIOPSY  05/29/2020   Procedure: BRONCHIAL NEEDLE ASPIRATION BIOPSIES;  Surgeon: Leslye Peer, MD;  Location: [facility] ENDOSCOPY;  Service: Pulmonary;;   BRONCHIAL WASHINGS  05/29/2020   Procedure: BRONCHIAL WASHINGS;  Surgeon: Leslye Peer, MD;  Location: [facility] ENDOSCOPY;  Service: Pulmonary;;   CATARACT EXTRACTION W/PHACO Left 01/23/2023   Procedure: CATARACT EXTRACTION PHACO AND INTRAOCULAR LENS PLACEMENT (IOC);  Surgeon: Fabio Pierce, MD;  Location: [facility] ORS;  Service: Ophthalmology;  Laterality: Left;  CDE: 13.69   CATARACT EXTRACTION W/PHACO Right 02/20/2023  Procedure: CATARACT EXTRACTION PHACO AND INTRAOCULAR LENS PLACEMENT (IOC);  Surgeon: Fabio Pierce, MD;  Location: [facility] ORS;  Service: Ophthalmology;  Laterality: Right;  CDE: 12.88   EYE SURGERY     INTERCOSTAL NERVE BLOCK Left 06/22/2020   Procedure: INTERCOSTAL NERVE BLOCK;  Surgeon: Loreli Slot, MD;  Location: [facility] OR;  Service: Thoracic;  Laterality: Left;   NODE DISSECTION Left 06/22/2020   Procedure: NODE DISSECTION;  Surgeon: Loreli Slot, MD;  Location: [facility] OR;  Service: Thoracic;  Laterality: Left;   TONSILLECTOMY     VIDEO BRONCHOSCOPY WITH ENDOBRONCHIAL NAVIGATION N/A 05/29/2020   Procedure: VIDEO BRONCHOSCOPY WITH ENDOBRONCHIAL NAVIGATION;  Surgeon: Leslye Peer, MD;  Location: [facility] ENDOSCOPY;  Service: Pulmonary;  Laterality: N/A;      Physical Exam   Vitals:   11/01/23 1210 11/01/23 1225  11/01/23 1548  BP: 135/71  (!) 149/79  Pulse: 70  66  Resp: 18  17  Temp: 97.7 F (36.5 C)  (!) 97.5 F (36.4 C)  TempSrc:   Oral  SpO2: 99%  100%  Weight:  75.8 kg   Height:  5\' 8"  (1.727 m)     Physical Exam Gen: NAD. Appears comfortable HENT: Conjunctiva clear, PERRL, EOMI. MMM.  CV: RRR. No M/R/G Pulm: Lungs CTAB with no wheezing, rales, or rhonchi.  GI: Abdomen soft, non-tender, non-distended. Normal bowel sounds in all 4 quadrants. MSK/Skin: No lower extremity edema. Extremities warm, well-perfused with 2+ pulses in all 4 extremities. Full active abduction at the shoulder.  4+/5 strength and right upper extremity.  5/5 strength in left upper extremity though with some decreased active range of motion at the left shoulder.  Mild lower cervical midline tenderness to palpation.  Mild lower thoracic and upper lumbar tenderness to palpation.  Decreased sensation to light touch diffusely over the right upper extremity.  Mild right-sided saddle anesthesia present.  Otherwise 5/5 strength in bilateral lower extremities with sensation intact to light touch throughout. Neuro: A&Ox3. GCS 15. Moves all extremities.  Remainder of neurologic exam as above.    Procedures   If procedures were preformed on this patient, they are listed below:  Procedures   Mikeal Hawthorne, MD Emergency Medicine PGY-2   Please note that this documentation was produced with the assistance of voice-to-text technology and may contain errors.    Mikeal Hawthorne, MD 11/02/23 Lacretia Leigh    Glyn Ade, MD 11/02/23 [phone]

## 2023-11-01 NOTE — ED Provider Triage Note (Signed)
Emergency Medicine Provider Triage Evaluation Note  William Wells , a 89 y.o. male  was evaluated in triage.  Pt complains of cervical and thoracic back pain that has been present x 1 week after he laid down on mattress and got off mattress. Says he reports feeling like "something gave way."  Reports radiation down right arm into R hand. Currently being evaluated for giant cell arteritis and has an [place] scheduled. Hx of R eye macular degeneration, L pulmonary nodule s/p lobectomy. Denies recent trauma.   Endorses chronic shortness of breath Denies fever, chest pain, abdominal pain, n/v/d, constipation, dysuria, hematuria, melena, hematochezia  Review of Systems  Positive: See above  Negative: See above  Physical Exam  BP 135/71 (BP Location: Left Arm)   Pulse 70   Temp 97.7 F (36.5 C)   Resp 18   Ht 5\' 8"  (1.727 m)   Wt 75.8 kg   SpO2 99%   BMI 25.39 kg/m  Gen:   Awake, no distress   Resp:  Normal effort  MSK:   Moves extremities without difficulty  Other:    Medical Decision Making  Medically screening exam initiated at 12:28 PM.  Appropriate orders placed.  NEPHI SAVAGE was informed that the remainder of the evaluation will be completed by another provider, this initial triage assessment does not replace that evaluation, and the importance of remaining in the ED until their evaluation is complete.     Lunette Stands, [place] 11/01/23 1235

## 2023-11-03 ENCOUNTER — Encounter: Payer: Self-pay | Admitting: Family Medicine

## 2023-11-05 DIAGNOSIS — H353211 Exudative age-related macular degeneration, right eye, with active choroidal neovascularization: Secondary | ICD-10-CM | POA: Diagnosis not present

## 2023-11-05 DIAGNOSIS — H3581 Retinal edema: Secondary | ICD-10-CM | POA: Diagnosis not present

## 2023-11-09 ENCOUNTER — Other Ambulatory Visit: Payer: Self-pay

## 2023-11-09 NOTE — Progress Notes (Signed)
 Patient name: William Wells MRN: [id number] DOB: [birth year] Sex: male  REASON FOR CONSULT: Temporal artery biopsy  HPI: William Wells is a 89 y.o. male, with history of hypertension, diabetes, CKD stage 3B, lung cancer presents for evaluation of temporal artery biopsy.  In review of the notes, patient has had right sided temporal headaches with scalp tenderness.  Patient states has had chronic headaches on the left side for years.  More recently has developed tenderness over his right temporal region and right side.  This tends to wax and wane.  Also was diagnosed with macular degeneration and has had injections in the right eye with vision changes since earlier this year.  He did have labs including a sed rate and CRP - I do not have these results.  Past Medical History:  Diagnosis Date   Acid reflux disease    Arthritis    Complication of anesthesia    appendix surgery (89 year old) - passed out once patient got back to floor unit   Diabetes mellitus    Hiatal hernia    High cholesterol    Hypertension    Hypothyroidism    Monoclonal gammopathy of unknown significance (MGUS) 03/28/2020   Normocytic anemia 03/28/2020   Renal disorder    renal insufficiency   Type 2 diabetes mellitus [facility])     Past Surgical History:  Procedure Laterality Date   APPENDECTOMY     BRONCHIAL BIOPSY  05/29/2020   Procedure: BRONCHIAL BIOPSIES;  Surgeon: Shelah Lamar RAMAN, MD;  Location: [facility] ENDOSCOPY;  Service: Pulmonary;;   BRONCHIAL BRUSHINGS  05/29/2020   Procedure: BRONCHIAL BRUSHINGS;  Surgeon: Shelah Lamar RAMAN, MD;  Location: [facility] ENDOSCOPY;  Service: Pulmonary;;   BRONCHIAL NEEDLE ASPIRATION BIOPSY  05/29/2020   Procedure: BRONCHIAL NEEDLE ASPIRATION BIOPSIES;  Surgeon: Shelah Lamar RAMAN, MD;  Location[facility] ENDOSCOPY;  Service: Pulmonary;;   BRONCHIAL WASHINGS  05/29/2020   Procedure: BRONCHIAL WASHINGS;  Surgeon: Shelah Lamar RAMAN, MD;  Location: [facility] ENDOSCOPY;  Service: Pulmonary;;   CATARACT EXTRACTION  W/PHACO Left 01/23/2023   Procedure: CATARACT EXTRACTION PHACO AND INTRAOCULAR LENS PLACEMENT (IOC);  Surgeon: Harrie Lynwood, MD;  Location: [facility] ORS;  Service: Ophthalmology;  Laterality: Left;  CDE: 13.69   CATARACT EXTRACTION W/PHACO Right 02/20/2023   Procedure: CATARACT EXTRACTION PHACO AND INTRAOCULAR LENS PLACEMENT (IOC);  Surgeon: Harrie Lynwood, MD;  Location: [facility] ORS;  Service: Ophthalmology;  Laterality: Right;  CDE: 12.88   EYE SURGERY     INTERCOSTAL NERVE BLOCK Left 06/22/2020   Procedure: INTERCOSTAL NERVE BLOCK;  Surgeon: Kerrin Elspeth BROCKS, MD;  Location: [facility] OR;  Service: Thoracic;  Laterality: Left;   NODE DISSECTION Left 06/22/2020   Procedure: NODE DISSECTION;  Surgeon: Kerrin Elspeth BROCKS, MD;  Location: [facility] OR;  Service: Thoracic;  Laterality: Left;   TONSILLECTOMY     VIDEO BRONCHOSCOPY WITH ENDOBRONCHIAL NAVIGATION N/A 05/29/2020   Procedure: VIDEO BRONCHOSCOPY WITH ENDOBRONCHIAL NAVIGATION;  Surgeon: Shelah Lamar RAMAN, MD;  Location: [facility] ENDOSCOPY;  Service: Pulmonary;  Laterality: N/A;    Family History  Problem Relation Age of Onset   Diabetes Mother    Cancer Father    Cancer Sister    Diabetes Brother    Cancer Brother    Diabetes Brother    Parkinson's disease Brother    Diabetes Brother     SOCIAL HISTORY: Social History   Socioeconomic History   Marital status: Widowed    Spouse name: Not on file   Number of children:  4   Years of education: Not on file   Highest education level: Not on file  Occupational History   Occupation: RETIRED  Tobacco Use   Smoking status: Former    Current packs/day: 0.00    Average packs/day: 0.5 packs/day for 47.0 years (23.5 ttl pk-yrs)    Types: Cigarettes    Start date: 10    Quit date: 2000    Years since quitting: 25.1   Smokeless tobacco: Never  Vaping Use   Vaping status: Never Used  Substance and Sexual Activity   Alcohol use: No   Drug use: No   Sexual activity: Not Currently  Other Topics Concern    Not on file  Social History Narrative   Not on file   Social Drivers of Health   Financial Resource Strain: Low Risk  (10/25/2020)   Overall Financial Resource Strain (CARDIA)    Difficulty of Paying Living Expenses: Not hard at all  Food Insecurity: No Food Insecurity (10/25/2020)   Hunger Vital Sign    Worried About Running Out of Food in the Last Year: Never true    Ran Out of Food in the Last Year: Never true  Transportation Needs: No Transportation Needs (10/25/2020)   PRAPARE - Administrator, Civil Service (Medical): No    Lack of Transportation (Non-Medical): No  Physical Activity: Sufficiently Active (10/25/2020)   Exercise Vital Sign    Days of Exercise per Week: 7 days    Minutes of Exercise per Session: 30 min  Stress: No Stress Concern Present (10/25/2020)   [employer] of Occupational Health - Occupational Stress Questionnaire    Feeling of Stress : Only a little  Social Connections: Moderately Integrated (10/25/2020)   Social Connection and Isolation Panel [NHANES]    Frequency of Communication with Friends and Family: More than three times a week    Frequency of Social Gatherings with Friends and Family: More than three times a week    Attends Religious Services: More than 4 times per year    Active Member of [employer] or Organizations: No    Attends Banker Meetings: Not on file    Marital Status: Married  Catering Manager Violence: Not At Risk (10/25/2020)   Humiliation, Afraid, Rape, and Kick questionnaire    Fear of Current or Ex-Partner: No    Emotionally Abused: No    Physically Abused: No    Sexually Abused: No    No Known Allergies  Current Outpatient Medications  Medication Sig Dispense Refill   acetaminophen  (TYLENOL ) 500 MG tablet Take 2 tablets (1,000 mg total) by mouth every 6 (six) hours. 30 tablet 0   docusate sodium  (COLACE) 100 MG capsule Take 1 capsule (100 mg total) by mouth 2 (two) times daily as needed for mild  constipation. 10 capsule 0   gabapentin  (NEURONTIN ) 300 MG capsule Take 300 mg by mouth 3 (three) times daily.     glipiZIDE  (GLUCOTROL  XL) 2.5 MG 24 hr tablet Take 2.5 mg by mouth daily as needed (blood sugar above 160).      HYDROcodone -acetaminophen  (NORCO/VICODIN) 5-325 MG tablet Take 1 tablet by mouth every 8 (eight) hours as needed. 15 tablet 0   Lancets (ONETOUCH DELICA PLUS LANCET30G) MISC 2 (two) times daily.     levothyroxine  (SYNTHROID ) 75 MCG tablet Take 75 mcg by mouth daily.     LOKELMA  5 g packet Take by mouth.     methocarbamol  (ROBAXIN ) 500 MG tablet Take 1 tablet (500  mg total) by mouth 2 (two) times daily. 20 tablet 0   Multiple Vitamin (MULTIVITAMIN ADULT PO) Take by mouth.     Multiple Vitamins-Minerals (PRESERVISION AREDS PO) Take by mouth.     omeprazole (PRILOSEC) 20 MG capsule Take 20 mg by mouth daily.     ONETOUCH ULTRA test strip 2 (two) times daily.     oxyCODONE  (ROXICODONE ) 5 MG immediate release tablet Take 1 tablet (5 mg total) by mouth every 6 (six) hours as needed for severe pain (pain score 7-10). 20 tablet 0   oxyCODONE -acetaminophen  (PERCOCET ) 5-325 MG tablet Take 1 tablet by mouth every 6 (six) hours as needed. 10 tablet 0   rosuvastatin  (CRESTOR ) 40 MG tablet Take 40 mg by mouth daily.      telmisartan (MICARDIS) 20 MG tablet Take 10 mg by mouth daily.     VITAMIN D  PO Take 2,000 Int'l Units by mouth daily.     VITAMIN E PO Take by mouth.     No current facility-administered medications for this visit.    REVIEW OF SYSTEMS:  [X]  denotes positive finding, [ ]  denotes negative finding Cardiac  Comments:  Chest pain or chest pressure:    Shortness of breath upon exertion:    Short of breath when lying flat:    Irregular heart rhythm:        Vascular    Pain in calf, thigh, or hip brought on by ambulation:    Pain in feet at night that wakes you up from your sleep:     Blood clot in your veins:    Leg swelling:         Pulmonary    Oxygen at  home:    Productive cough:     Wheezing:         Neurologic    Sudden weakness in arms or legs:     Sudden numbness in arms or legs:     Sudden onset of difficulty speaking or slurred speech:    Temporary loss of vision in one eye:     Problems with dizziness:         Gastrointestinal    Blood in stool:     Vomited blood:         Genitourinary    Burning when urinating:     Blood in urine:        Psychiatric    Major depression:         Hematologic    Bleeding problems:    Problems with blood clotting too easily:        Skin    Rashes or ulcers:        Constitutional    Fever or chills:      PHYSICAL EXAM: There were no vitals filed for this visit.  GENERAL: The patient is a well-nourished male, in no acute distress. The vital signs are documented above. CARDIAC: There is a regular rate and rhythm.  VASCULAR:  Bilateral radial pulses palpable Right scalp tenderness with palpable temporal pulse No left sided scalp tenderness PULMONARY: No respiratory distress. ABDOMEN: Soft and non-tender. MUSCULOSKELETAL: There are no major deformities or cyanosis. NEUROLOGIC: No focal weakness or paresthesias are detected. SKIN: There are no ulcers or rashes noted. PSYCHIATRIC: The patient has a normal affect.  DATA:   N/A  Assessment/Plan:  89 y.o. male, with history of hypertension, diabetes, CKD stage 3B, lung cancer presents for evaluation of temporal artery biopsy.  His most recent symptoms involve  right-sided scalp tenderness with new right-sided headaches (whereas his left-sided headaches are chronic in nature).  I have recommended a right temporal artery biopsy to evaluate for giant cell arteritis.  Discussed this being done as an outpatient at [facility].  Discussed incision along the hairline with risk of injury to the temporal branch of the facial nerve.  I discussed this is the gold standard for diagnosis and to determine if he needs a longer term steroid  therapy.  I think he can cancel his temporal ultrasound in [place] this afternoon as I do not think it will direct management given he needs a biopsy to truly rule this out.  Will get scheduled next week at [facility].   Lonni DOROTHA Gaskins, MD [facility] Office: [phone]

## 2023-11-10 ENCOUNTER — Ambulatory Visit (INDEPENDENT_AMBULATORY_CARE_PROVIDER_SITE_OTHER): Payer: Medicare Other | Admitting: Vascular Surgery

## 2023-11-10 ENCOUNTER — Other Ambulatory Visit (HOSPITAL_COMMUNITY): Payer: Self-pay | Admitting: Family Medicine

## 2023-11-10 ENCOUNTER — Encounter: Payer: Self-pay | Admitting: Vascular Surgery

## 2023-11-10 ENCOUNTER — Ambulatory Visit (HOSPITAL_COMMUNITY): Admission: RE | Admit: 2023-11-10 | Payer: Medicare Other | Source: Ambulatory Visit

## 2023-11-10 VITALS — BP 148/72 | HR 70 | Temp 97.8°F | Resp 18 | Ht 68.0 in | Wt 178.2 lb

## 2023-11-10 DIAGNOSIS — R519 Headache, unspecified: Secondary | ICD-10-CM

## 2023-11-10 DIAGNOSIS — G44209 Tension-type headache, unspecified, not intractable: Secondary | ICD-10-CM

## 2023-11-11 ENCOUNTER — Telehealth: Payer: Self-pay

## 2023-11-11 DIAGNOSIS — G959 Disease of spinal cord, unspecified: Secondary | ICD-10-CM | POA: Diagnosis not present

## 2023-11-11 DIAGNOSIS — M4722 Other spondylosis with radiculopathy, cervical region: Secondary | ICD-10-CM | POA: Diagnosis not present

## 2023-11-11 NOTE — Progress Notes (Signed)
 Transition Care Management Unsuccessful Follow-up Telephone Call  Date of discharge and from where:  [facility] 1[facility]  Attempts:  1st Attempt  Reason for unsuccessful TCM follow-up call:  No answer/busy   Jon Colt [facility] Guide, Phone: [phone] Fax: [phone] Website: [email or web address]

## 2023-11-12 ENCOUNTER — Other Ambulatory Visit: Payer: Self-pay | Admitting: *Deleted

## 2023-11-12 ENCOUNTER — Telehealth: Payer: Self-pay

## 2023-11-12 DIAGNOSIS — G44209 Tension-type headache, unspecified, not intractable: Secondary | ICD-10-CM

## 2023-11-12 NOTE — Telephone Encounter (Signed)
 Attempted to call for surgery scheduling. LVM

## 2023-11-17 ENCOUNTER — Encounter (HOSPITAL_COMMUNITY): Payer: Self-pay

## 2023-11-17 ENCOUNTER — Encounter (HOSPITAL_COMMUNITY)
Admission: RE | Admit: 2023-11-17 | Discharge: 2023-11-17 | Disposition: A | Discharge status: Home / Self Care | Payer: Medicare Other | Source: Ambulatory Visit | Attending: Vascular Surgery | Admitting: Vascular Surgery

## 2023-11-17 ENCOUNTER — Other Ambulatory Visit: Payer: Self-pay

## 2023-11-17 VITALS — BP 132/72 | HR 68 | Temp 97.8°F | Resp 18 | Ht 68.0 in | Wt 175.3 lb

## 2023-11-17 DIAGNOSIS — N1832 Chronic kidney disease, stage 3b: Secondary | ICD-10-CM | POA: Insufficient documentation

## 2023-11-17 DIAGNOSIS — I129 Hypertensive chronic kidney disease with stage 1 through stage 4 chronic kidney disease, or unspecified chronic kidney disease: Secondary | ICD-10-CM | POA: Insufficient documentation

## 2023-11-17 DIAGNOSIS — K449 Diaphragmatic hernia without obstruction or gangrene: Secondary | ICD-10-CM | POA: Insufficient documentation

## 2023-11-17 DIAGNOSIS — Z87891 Personal history of nicotine dependence: Secondary | ICD-10-CM | POA: Diagnosis not present

## 2023-11-17 DIAGNOSIS — E039 Hypothyroidism, unspecified: Secondary | ICD-10-CM | POA: Diagnosis not present

## 2023-11-17 DIAGNOSIS — E1122 Type 2 diabetes mellitus with diabetic chronic kidney disease: Secondary | ICD-10-CM | POA: Insufficient documentation

## 2023-11-17 DIAGNOSIS — G44209 Tension-type headache, unspecified, not intractable: Secondary | ICD-10-CM | POA: Diagnosis not present

## 2023-11-17 DIAGNOSIS — Z01812 Encounter for preprocedural laboratory examination: Secondary | ICD-10-CM | POA: Diagnosis not present

## 2023-11-17 DIAGNOSIS — Z01818 Encounter for other preprocedural examination: Secondary | ICD-10-CM

## 2023-11-17 HISTORY — DX: Family history of other specified conditions: Z84.89

## 2023-11-17 HISTORY — DX: Dyspnea, unspecified: R06.00

## 2023-11-17 HISTORY — DX: Malignant neoplasm of unspecified part of unspecified bronchus or lung: C34.90

## 2023-11-17 LAB — BASIC METABOLIC PANEL
Anion gap: 4 — ABNORMAL LOW (ref 5–15)
BUN: 32 mg/dL — ABNORMAL HIGH (ref 8–23)
CO2: 25 mmol/L (ref 22–32)
Calcium: 8.8 mg/dL — ABNORMAL LOW (ref 8.9–10.3)
Chloride: 109 mmol/L (ref 98–111)
Creatinine, Ser: 1.96 mg/dL — ABNORMAL HIGH (ref 0.61–1.24)
GFR, Estimated: 32 mL/min — ABNORMAL LOW (ref >60)
Glucose, Bld: 100 mg/dL — ABNORMAL HIGH (ref 70–99)
Potassium: 5.1 mmol/L (ref 3.5–5.1)
Sodium: 138 mmol/L (ref 135–145)

## 2023-11-17 LAB — CBC
HCT: 35.5 % — ABNORMAL LOW (ref 39.0–52.0)
Hemoglobin: 11.8 g/dL — ABNORMAL LOW (ref 13.0–17.0)
MCH: 29.9 pg (ref 26.0–34.0)
MCHC: 33.2 g/dL (ref 30.0–36.0)
MCV: 89.9 fL (ref 80.0–100.0)
Platelets: 160 10*3/uL (ref 150–400)
RBC: 3.95 MIL/uL — ABNORMAL LOW (ref 4.22–5.81)
RDW: 15.1 % (ref 11.5–15.5)
WBC: 7.9 10*3/uL (ref 4.0–10.5)
nRBC: 0 % (ref 0.0–0.2)

## 2023-11-17 LAB — GLUCOSE, CAPILLARY: Glucose-Capillary: 83 mg/dL (ref 70–99)

## 2023-11-17 NOTE — Progress Notes (Signed)
PCP - Dr. Dwana Melena Cardiologist - has not seen yet but scheduled to see Dr. Jenene Slicker in April  PPM/ICD - denies Device Orders - n/a Rep Notified - n/a  Chest x-ray - denies EKG - January 2025 - requested tracing from Dr. Margo Aye Stress Test - denies ECHO - 09/17/23 Cardiac Cath - denies  Sleep Study - denies CPAP - n/a  Fasting Blood Sugar - 100-120 Checks Blood Sugar __3___ times a day  Last dose of GLP1 agonist-  n/a GLP1 instructions: n/a  Blood Thinner Instructions: n/a Aspirin Instructions: n/a  ERAS Protcol - NPO   COVID TEST- n/a   Anesthesia review: yes  Patient denies shortness of breath, fever, cough and chest pain at PAT appointment   All instructions explained to the patient, with a verbal understanding of the material. Patient agrees to go over the instructions while at home for a better understanding. Patient also instructed to self quarantine after being tested for COVID-19. The opportunity to ask questions was provided.

## 2023-11-18 ENCOUNTER — Encounter (HOSPITAL_COMMUNITY): Payer: Self-pay

## 2023-11-18 NOTE — Progress Notes (Signed)
Anesthesia Chart Review:  Case: [id number] Date/Time: 11/23/23 1131   Procedure: RIGHT BIOPSY TEMPORAL ARTERY (Right)   Anesthesia type: Choice   Pre-op diagnosis: TENSION-TYPE HEADACHE   [facility] / [facility]   Surgeons: Cephus Shelling, MD       DISCUSSION: Patient is an 89 year old male scheduled for the above procedure. He was referred to Dr. Chestine Spore earlier this month for right sided temporal headaches with scalp tenderness.with need for temporal artery biopsy to evaluate for giant cell arteritis. CRP was normal at 2 and ESR was elevated at 35 on 10/28/23. He is not currently on steroid therapy.   History includes former smoker (quit 2000), HTN, MGUS, DM2, hypothyroidism, GERD, hiatal hernia, anemia, dyspnea, CKD (Stage 3b), spinal surgery (C2-3 & C5-7 posterior laminectomy 09/06/02), lung cancer (s/p XI robotic assisted thoracoscopy for LU lobectomy 06/22/20), cataracts (extraction left 01/23/23 & right 02/20/23). He had syncope after appendectomy > 40 years ago. He has had issues with prolonged emergence in the past.   Recently referred to cardiology by Lupita Raider, DNP on 09/08/23 for DOE, palpitations, lightheadedness on standing. No chest pain. By primary care records scanned under Media tab, EKG showed NSR at 62 bpm with first degree AV block (PR 326 ms). He has known first degree AV block. BNP was normal at 59.0 on 09/08/23. He is scheduled to see Dr. Luane School on 01/05/24. In the meantime, he did have an echocardiogram on 09/17/23 that showed LVEF 60-65%, no regional wall motion abnormalities, normal RV systolic function, normal PASP, trivial MR, indeterminate number of aortic valve cusp but no AR visualized and no AS present.  Last visit with oncologist Dr. Ellin Saba was on 06/03/23 for follow-up IgM kappa MGUS and left lung adenocarcinoma. Recommended starting iron tablets MWF with stool softener for HGB 11.7. No new infections. No lytic lesions on skeletal survey from  05/14/23. New peribrochovascular nodularity in the LLL on August 2024 CT with six month follow-up imaging recommended.   He is followed by nephrologist Dr. Wolfgang Phoenix for CKD stage 3b. He is also on [facility] MWF. Creatinine 1.96 on 11/17/23 which appears within his baseline range.   Anesthesia team to evaluate on the day of surgery.    VS: BP 132/72   Pulse 68   Temp 36.6 C   Resp 18   Ht 5\' 8"  (1.727 m)   Wt 79.5 kg   SpO2 99%   BMI 26.65 kg/m   PROVIDERS: Benita Stabile, MD is PCP  Doreatha Massed, MD is HEM-ONC Celso Amy, MD is nephrologist   LABS: Preoperative labs noted. Cr 1.96 with eGFR 32. He has known history of CKD 3b. A1c 6.8% on 10/21/23 (Dr. Margo Aye, see CE) (all labs ordered are listed, but only abnormal results are displayed)  Labs Reviewed  BASIC METABOLIC PANEL - Abnormal; Notable for the following components:      Result Value   Glucose, Bld 100 (*)    BUN 32 (*)    Creatinine, Ser 1.96 (*)    Calcium 8.8 (*)    GFR, Estimated 32 (*)    Anion gap 4 (*)    All other components within normal limits  CBC - Abnormal; Notable for the following components:   RBC 3.95 (*)    Hemoglobin 11.8 (*)    HCT 35.5 (*)    All other components within normal limits  GLUCOSE, CAPILLARY     IMAGES: CT Chest 05/14/23: IMPRESSION: 1. Left upper lobectomy. No evidence  of recurrent or metastatic disease. 2. New peribronchovascular nodularity in the left lower lobe, indicative of an infectious bronchiolitis. 3. Aortic atherosclerosis (ICD10-I70.0). Coronary artery calcification. 4.  Emphysema (ICD10-J43.9).   DG Bone Survey 05/14/23: MPRESSION: 1. No lytic bone lesions. 2. Extensive cervical, thoracic and lumbar spine degenerative changes with changes of DISH.    EKG: EKG (Dr. Margo Aye): Requested. By notes, it showed NSR at 62 bpm with first degree AV block (PR 326 ms.  EKG 06/20/20: Sinus rhythm with 1st degree A-V block (PR 292 ms) Septal infarct , age  undetermined Abnormal ECG No significant change since last tracing Confirmed by Dina Rich [id number]) on 06/21/2020 10:11:21 AM   CV: Echo 09/17/23: IMPRESSIONS   1. Left ventricular ejection fraction, by estimation, is 60 to 65%. The  left ventricle has normal function. The left ventricle has no regional  wall motion abnormalities. Left ventricular diastolic parameters are  indeterminate.   2. Right ventricular systolic function is normal. The right ventricular  size is normal. There is normal pulmonary artery systolic pressure.   3. The mitral valve is normal in structure. Trivial mitral valve  regurgitation. No evidence of mitral stenosis.   4. The aortic valve has an indeterminant number of cusps. Aortic valve  regurgitation is not visualized. No aortic stenosis is present.   5. The inferior vena cava is normal in size with greater than 50%  respiratory variability, suggesting right atrial pressure of 3 mmHg.   Comparison(s): No prior Echocardiogram.     Past Medical History:  Diagnosis Date   Acid reflux disease    Arthritis    Complication of anesthesia    appendix surgery (89 year old) - passed out once patient got back to floor unit   Diabetes mellitus    Dyspnea    Family history of adverse reaction to anesthesia    daughter states that she has a difficult time waking up   Hiatal hernia    High cholesterol    Hypertension    Hypothyroidism    Monoclonal gammopathy of unknown significance (MGUS) 03/28/2020   Normocytic anemia 03/28/2020   Renal disorder    renal insufficiency   Type 2 diabetes mellitus (HCC)     Past Surgical History:  Procedure Laterality Date   APPENDECTOMY     BRONCHIAL BIOPSY  05/29/2020   Procedure: BRONCHIAL BIOPSIES;  Surgeon: Leslye Peer, MD;  [facility] END[facility];  Service: Pulmonary;;   BRONCHIAL BRUSHINGS  05/29/2020   Procedure: BRONCHIAL BRUSHINGS;  Surgeon: Leslye Peer, MD;  Location: [facility] ENDOSCOPY;  Service:  Pulmonary;;   BRONCHIAL NEEDLE ASPIRATION BIOPSY  05/29/2020   Procedure: BRONCHIAL NEEDLE ASPIRATION BIOPSIES;  Surgeon: Leslye Peer, MD;  Location: [facility] ENDOSCOPY;  Service: Pulmonary;;   BRONCHIAL WASHINGS  05/29/2020   Procedure: BRONCHIAL WASHINGS;  Surgeon: Leslye Peer, MD;  Location: [facility] ENDOSCOPY;  Service: Pulmonary;;   CATARACT EXTRACTION W/PHACO Left 01/23/2023   Procedure: CATARACT EXTRACTION PHACO AND INTRAOCULAR LENS PLACEMENT (IOC);  Surgeon: Fabio Pierce, MD;  Location: [facility] ORS;  Service: Ophthalmology;  Laterality: Left;  CDE: 13.69   CATARACT EXTRACTION W/PHACO Right 02/20/2023   Procedure: CATARACT EXTRACTION PHACO AND INTRAOCULAR LENS PLACEMENT (IOC);  Surgeon: Fabio Pierce, MD;  Location: [facility] ORS;  Service: Ophthalmology;  Laterality: Right;  CDE: 12.88   CERVICAL SPINE SURGERY     EYE SURGERY     strabismus   INTERCOSTAL NERVE BLOCK Left 06/22/2020   Procedure: INTERCOSTAL NERVE BLOCK;  Surgeon: Charlett Lango  C, MD;  Location: [facility] OR;  Service: Thoracic;  Laterality: Left;   NODE DISSECTION Left 06/22/2020   Procedure: NODE DISSECTION;  Surgeon: Loreli Slot, MD;  Location: [facility] OR;  Service: Thoracic;  Laterality: Left;   TONSILLECTOMY     VIDEO BRONCHOSCOPY WITH ENDOBRONCHIAL NAVIGATION N/A 05/29/2020   Procedure: VIDEO BRONCHOSCOPY WITH ENDOBRONCHIAL NAVIGATION;  Surgeon: Leslye Peer, MD;  [facility] END[facility];  Service: Pulmonary;  Laterality: N/A;    MEDICATIONS:  acetaminophen (TYLENOL) 500 MG tablet   carboxymethylcellulose 1 % ophthalmic solution   diclofenac Sodium (VOLTAREN) 1 % GEL   docusate sodium (COLACE) 100 MG capsule   ferrous sulfate 325 (65 FE) MG tablet   gabapentin (NEURONTIN) 300 MG capsule   glipiZIDE (GLUCOTROL XL) 2.5 MG 24 hr tablet   HYDROcodone-acetaminophen (NORCO/VICODIN) 5-325 MG tablet   Lancets (ONETOUCH DELICA PLUS LANCET30G) MISC   levothyroxine (SYNTHROID) 75 MCG tablet   LOKELMA 5 g packet    methocarbamol (ROBAXIN) 500 MG tablet   Multiple Vitamin (MULTIVITAMIN ADULT PO)   Multiple Vitamins-Minerals (PRESERVISION AREDS PO)   omeprazole (PRILOSEC) 20 MG capsule   ONETOUCH ULTRA test strip   oxyCODONE (ROXICODONE) 5 MG immediate release tablet   oxyCODONE-acetaminophen (PERCOCET) 5-325 MG tablet   rosuvastatin (CRESTOR) 40 MG tablet   telmisartan (MICARDIS) 20 MG tablet   VITAMIN D PO   VITAMIN E PO   No current facility-administered medications for this encounter.    Shonna Chock, PA-C Surgical Short Stay/Anesthesiology [facility] Phone [phone] [facility] Phone [phone] 11/18/2023 3:48 PM

## 2023-11-18 NOTE — Anesthesia Preprocedure Evaluation (Addendum)
 Anesthesia Evaluation  Patient identified by MRN, date of birth, ID band Patient awake    Reviewed: Allergy & Precautions, NPO status , Patient's Chart, lab work & pertinent test results  History of Anesthesia Complications Negative for: history of anesthetic complications  Airway Mallampati: I  TM Distance: >3 FB Neck ROM: Full    Dental  (+) Edentulous Upper, Edentulous Lower   Pulmonary former smoker S/p LU Lobectomy for lung CA   breath sounds clear to auscultation       Cardiovascular hypertension, Pt. on medications (-) angina  Rhythm:Regular Rate:Normal  09/2023 Echocardiogram:  LVEF 60-65%, no regional wall motion abnormalities, normal RV systolic function, normal PASP, trivial MR, indeterminate number of aortic valve cusp but no AR visualized and no AS present  Wenckeback on 12-lead, BP and HR stable   Neuro/Psych  Headaches    GI/Hepatic Neg liver ROS,GERD  Controlled and Medicated,,  Endo/Other  diabetes (glu 100), Oral Hypoglycemic AgentsHypothyroidism    Renal/GU Renal InsufficiencyRenal disease     Musculoskeletal   Abdominal   Peds  Hematology Hb 11.8, plt 160k   Anesthesia Other Findings   Reproductive/Obstetrics                             Anesthesia Physical Anesthesia Plan  ASA: 3  Anesthesia Plan: MAC   Post-op Pain Management: Tylenol PO (pre-op)* and Minimal or no pain anticipated   Induction:   PONV Risk Score and Plan: 1 and Treatment may vary due to age or medical condition  Airway Management Planned: Natural Airway and Simple Face Mask  Additional Equipment: None  Intra-op Plan:   Post-operative Plan:   Informed Consent: I have reviewed the patients History and Physical, chart, labs and discussed the procedure including the risks, benefits and alternatives for the proposed anesthesia with the patient or authorized representative who has indicated  his/her understanding and acceptance.       Plan Discussed with: CRNA and Surgeon  Anesthesia Plan Comments: (PAT note written 11/18/2023 by Shonna Chock, PA-C.  )       Anesthesia Quick Evaluation

## 2023-11-23 ENCOUNTER — Ambulatory Visit (HOSPITAL_COMMUNITY): Payer: Medicare Other | Admitting: Anesthesiology

## 2023-11-23 ENCOUNTER — Ambulatory Visit (HOSPITAL_COMMUNITY): Payer: Medicare Other | Admitting: Vascular Surgery

## 2023-11-23 ENCOUNTER — Encounter (HOSPITAL_COMMUNITY): Payer: Self-pay | Admitting: Vascular Surgery

## 2023-11-23 ENCOUNTER — Other Ambulatory Visit: Payer: Self-pay

## 2023-11-23 ENCOUNTER — Encounter (HOSPITAL_COMMUNITY)
Admission: RE | Disposition: A | Discharge status: Home / Self Care | Payer: Self-pay | Source: Home / Self Care | Attending: Vascular Surgery

## 2023-11-23 ENCOUNTER — Ambulatory Visit (HOSPITAL_COMMUNITY)
Admission: RE | Admit: 2023-11-23 | Discharge: 2023-11-23 | Disposition: A | Discharge status: Home / Self Care | Payer: Medicare Other | Attending: Vascular Surgery | Admitting: Vascular Surgery

## 2023-11-23 DIAGNOSIS — G44209 Tension-type headache, unspecified, not intractable: Secondary | ICD-10-CM

## 2023-11-23 DIAGNOSIS — Z87891 Personal history of nicotine dependence: Secondary | ICD-10-CM | POA: Diagnosis not present

## 2023-11-23 DIAGNOSIS — H3530 Unspecified macular degeneration: Secondary | ICD-10-CM | POA: Insufficient documentation

## 2023-11-23 DIAGNOSIS — Z85118 Personal history of other malignant neoplasm of bronchus and lung: Secondary | ICD-10-CM | POA: Diagnosis not present

## 2023-11-23 DIAGNOSIS — Z902 Acquired absence of lung [part of]: Secondary | ICD-10-CM | POA: Insufficient documentation

## 2023-11-23 DIAGNOSIS — Z7984 Long term (current) use of oral hypoglycemic drugs: Secondary | ICD-10-CM | POA: Diagnosis not present

## 2023-11-23 DIAGNOSIS — I129 Hypertensive chronic kidney disease with stage 1 through stage 4 chronic kidney disease, or unspecified chronic kidney disease: Secondary | ICD-10-CM | POA: Diagnosis not present

## 2023-11-23 DIAGNOSIS — I10 Essential (primary) hypertension: Secondary | ICD-10-CM | POA: Diagnosis not present

## 2023-11-23 DIAGNOSIS — R519 Headache, unspecified: Secondary | ICD-10-CM | POA: Diagnosis not present

## 2023-11-23 DIAGNOSIS — N182 Chronic kidney disease, stage 2 (mild): Secondary | ICD-10-CM | POA: Diagnosis not present

## 2023-11-23 DIAGNOSIS — G44229 Chronic tension-type headache, not intractable: Secondary | ICD-10-CM | POA: Diagnosis not present

## 2023-11-23 DIAGNOSIS — E039 Hypothyroidism, unspecified: Secondary | ICD-10-CM | POA: Diagnosis not present

## 2023-11-23 DIAGNOSIS — E1122 Type 2 diabetes mellitus with diabetic chronic kidney disease: Secondary | ICD-10-CM | POA: Diagnosis not present

## 2023-11-23 DIAGNOSIS — N1832 Chronic kidney disease, stage 3b: Secondary | ICD-10-CM | POA: Diagnosis not present

## 2023-11-23 DIAGNOSIS — K219 Gastro-esophageal reflux disease without esophagitis: Secondary | ICD-10-CM | POA: Insufficient documentation

## 2023-11-23 DIAGNOSIS — M316 Other giant cell arteritis: Secondary | ICD-10-CM | POA: Diagnosis not present

## 2023-11-23 DIAGNOSIS — I708 Atherosclerosis of other arteries: Secondary | ICD-10-CM | POA: Diagnosis not present

## 2023-11-23 DIAGNOSIS — E119 Type 2 diabetes mellitus without complications: Secondary | ICD-10-CM | POA: Diagnosis not present

## 2023-11-23 HISTORY — PX: ARTERY BIOPSY: SHX891

## 2023-11-23 LAB — GLUCOSE, CAPILLARY
Glucose-Capillary: 100 mg/dL — ABNORMAL HIGH (ref 70–99)
Glucose-Capillary: 105 mg/dL — ABNORMAL HIGH (ref 70–99)
Glucose-Capillary: 121 mg/dL — ABNORMAL HIGH (ref 70–99)

## 2023-11-23 SURGERY — BIOPSY TEMPORAL ARTERY
Anesthesia: Monitor Anesthesia Care | Site: Face | Laterality: Right

## 2023-11-23 MED ORDER — LIDOCAINE HCL (PF) 1 % IJ SOLN
INTRAMUSCULAR | Status: DC | PRN
  Administered 2023-11-23: 7 mL

## 2023-11-23 MED ORDER — INSULIN ASPART 100 UNIT/ML IJ SOLN: 0.0000 [IU] | INTRAMUSCULAR | Status: DC | PRN

## 2023-11-23 MED ORDER — SODIUM CHLORIDE 0.9 % IV SOLN: INTRAVENOUS | Status: DC

## 2023-11-23 MED ORDER — PROPOFOL 10 MG/ML IV BOLUS
INTRAVENOUS | Status: DC | PRN
  Administered 2023-11-23: 30 mg via INTRAVENOUS

## 2023-11-23 MED ORDER — PROPOFOL 500 MG/50ML IV EMUL
INTRAVENOUS | Status: DC | PRN
  Administered 2023-11-23: 75 ug/kg/min via INTRAVENOUS

## 2023-11-23 MED ORDER — OXYCODONE HCL 5 MG PO TABS: 5.0000 mg | ORAL_TABLET | Freq: Four times a day (QID) | ORAL | 0 refills | Status: DC | PRN

## 2023-11-23 MED ORDER — CEFAZOLIN SODIUM-DEXTROSE 2-4 GM/100ML-% IV SOLN
2.0000 g | INTRAVENOUS | Status: AC
  Administered 2023-11-23: 2 g via INTRAVENOUS
  Filled 2023-11-23: qty 100

## 2023-11-23 MED ORDER — CHLORHEXIDINE GLUCONATE 0.12 % MT SOLN
15.0000 mL | Freq: Once | OROMUCOSAL | Status: AC
  Administered 2023-11-23: 15 mL via OROMUCOSAL
  Filled 2023-11-23: qty 15

## 2023-11-23 MED ORDER — ONDANSETRON HCL 4 MG/2ML IJ SOLN
INTRAMUSCULAR | Status: DC | PRN
  Administered 2023-11-23: 4 mg via INTRAVENOUS

## 2023-11-23 MED ORDER — FENTANYL CITRATE (PF) 250 MCG/5ML IJ SOLN
INTRAMUSCULAR | Status: AC
  Filled 2023-11-23: qty 5

## 2023-11-23 MED ORDER — ONDANSETRON HCL 4 MG/2ML IJ SOLN
INTRAMUSCULAR | Status: AC
  Filled 2023-11-23: qty 2

## 2023-11-23 MED ORDER — CHLORHEXIDINE GLUCONATE 4 % EX SOLN: 60.0000 mL | Freq: Once | CUTANEOUS | Status: DC

## 2023-11-23 MED ORDER — MIDAZOLAM HCL 2 MG/2ML IJ SOLN
INTRAMUSCULAR | Status: AC
  Filled 2023-11-23: qty 2

## 2023-11-23 MED ORDER — LACTATED RINGERS IV SOLN: INTRAVENOUS | Status: DC

## 2023-11-23 MED ORDER — LIDOCAINE 2% (20 MG/ML) 5 ML SYRINGE
INTRAMUSCULAR | Status: DC | PRN
  Administered 2023-11-23: 40 mg via INTRAVENOUS

## 2023-11-23 MED ORDER — ACETAMINOPHEN 500 MG PO TABS
1000.0000 mg | ORAL_TABLET | Freq: Once | ORAL | Status: AC
  Administered 2023-11-23: 1000 mg via ORAL
  Filled 2023-11-23: qty 2

## 2023-11-23 MED ORDER — ORAL CARE MOUTH RINSE: 15.0000 mL | Freq: Once | OROMUCOSAL | Status: AC

## 2023-11-23 SURGICAL SUPPLY — 36 items
BAG COUNTER SPONGE SURGICOUNT (BAG) ×1 IMPLANT
CANISTER SUCT 3000ML PPV (MISCELLANEOUS) ×1 IMPLANT
CLIP TI WIDE RED SMALL 6 (CLIP) ×1 IMPLANT
CNTNR URN SCR LID CUP LEK RST (MISCELLANEOUS) ×1 IMPLANT
COTTONBALL LRG STERILE PKG (GAUZE/BANDAGES/DRESSINGS) ×1 IMPLANT
COVER SURGICAL LIGHT HANDLE (MISCELLANEOUS) ×1 IMPLANT
DERMABOND ADVANCED .7 DNX12 (GAUZE/BANDAGES/DRESSINGS) ×1 IMPLANT
DERMABOND ADVANCED .7 DNX6 (GAUZE/BANDAGES/DRESSINGS) IMPLANT
DRAPE OPHTHALMIC 77X100 STRL (CUSTOM PROCEDURE TRAY) ×1 IMPLANT
ELECT NDL BLADE 2-5/6 (NEEDLE) ×1 IMPLANT
ELECT NEEDLE BLADE 2-5/6 (NEEDLE) ×1 IMPLANT
ELECT REM PT RETURN 9FT ADLT (ELECTROSURGICAL) ×1 IMPLANT
ELECTRODE REM PT RTRN 9FT ADLT (ELECTROSURGICAL) ×1 IMPLANT
GAUZE 4X4 16PLY ~~LOC~~+RFID DBL (SPONGE) ×1 IMPLANT
GEL ULTRASOUND 8.5O AQUASONIC (MISCELLANEOUS) ×1 IMPLANT
GLOVE BIO SURGEON STRL SZ7.5 (GLOVE) ×1 IMPLANT
GLOVE BIOGEL PI IND STRL 8 (GLOVE) ×1 IMPLANT
GOWN STRL REUS W/ TWL LRG LVL3 (GOWN DISPOSABLE) ×1 IMPLANT
GOWN STRL REUS W/ TWL XL LVL3 (GOWN DISPOSABLE) ×1 IMPLANT
KIT BASIN OR (CUSTOM PROCEDURE TRAY) ×1 IMPLANT
KIT TURNOVER KIT B (KITS) ×1 IMPLANT
NDL HYPO 25GX1X1/2 BEV (NEEDLE) ×1 IMPLANT
NEEDLE HYPO 25GX1X1/2 BEV (NEEDLE) ×1 IMPLANT
NS IRRIG 1000ML POUR BTL (IV SOLUTION) ×1 IMPLANT
PACK GENERAL/GYN (CUSTOM PROCEDURE TRAY) ×1 IMPLANT
PAD ARMBOARD 7.5X6 YLW CONV (MISCELLANEOUS) ×2 IMPLANT
SPIKE FLUID TRANSFER (MISCELLANEOUS) ×1 IMPLANT
SUCTION TUBE FRAZIER 10FR DISP (SUCTIONS) ×1 IMPLANT
SUT MNCRL AB 4-0 PS2 18 (SUTURE) ×1 IMPLANT
SUT PROLENE 6 0 BV (SUTURE) IMPLANT
SUT SILK 3-0 18XBRD TIE 12 (SUTURE) IMPLANT
SUT VIC AB 3-0 SH 27X BRD (SUTURE) ×1 IMPLANT
SYR CONTROL 10ML LL (SYRINGE) ×1 IMPLANT
TOWEL GREEN STERILE (TOWEL DISPOSABLE) ×1 IMPLANT
VASCULAR TIE MINI RED 18IN STL (MISCELLANEOUS) ×1 IMPLANT
WATER STERILE IRR 1000ML POUR (IV SOLUTION) ×1 IMPLANT

## 2023-11-23 NOTE — Op Note (Signed)
 Date: November 23, 2023  Preoperative diagnosis: Concern for temporal arteritis  Postoperative diagnosis: Same  Procedure: Right temporal artery biopsy  Surgeon: Dr. Cephus Shelling, MD  Assistant: OR staff  Indications: 89 year old male that was seen for evaluation of right sided temporal headaches with scalp tenderness in the setting of chronic left-sided headaches.  We were requested for temporal artery biopsy.  He presents today for right temporal artery biopsy after risks benefits discussed.  Findings: 2.5 cm sample of right temporal artery successfully biopsied and sent to pathology.  Anesthesia: MAC  Details: Patient was taken to the operating room after informed consent was obtained.  Placed on the operative table in supine position.  I did use a pencil Doppler and marked out the course of his temporal artery on the right side along the hairline on the right temporal region.  We did have to shave a little bit of his hair.  This area was prepped and draped in standard sterile fashion.  Antibiotics were given and timeout performed.  I then injected 1% lidocaine without epinephrine to get a local field block.  I made an incision here with 15 blade scalpel.  I used spring retractors and ultimately with a pencil Bovie was able to dissect out the temporal artery.  I did use a Metzenbaum scissors for the additional dissection.  I did put a pencil Doppler on to confirm arterial flow.  This was then transected on each end in the surgical field over right angle clamps and passed a 2.5 cm specimen off the field to pathology.  Each end of the artery was then ligated with 3-0 silk ties and small clips.  This was irrigated.  Hemostasis was achieved.  The wound was closed with 3-0 Vicryl 4-0 Monocryl and Dermabond.  Complication: None  Condition: Stable  Cephus Shelling, MD [facility] Office: [phone]   Cephus Shelling

## 2023-11-23 NOTE — H&P (Signed)
 History and Physical Interval Note:  11/23/2023 10:46 AM  William Wells  has presented today for surgery, with the diagnosis of TENSION-TYPE HEADACHE.  The various methods of treatment have been discussed with the patient and family. After consideration of risks, benefits and other options for treatment, the patient has consented to  Procedure(s): RIGHT BIOPSY TEMPORAL ARTERY (Right) as a surgical intervention.  The patient's history has been reviewed, patient examined, no change in status, stable for surgery.  I have reviewed the patient's chart and labs.  Questions were answered to the patient's satisfaction.    Right temporal artery biopsy  Cephus Shelling     Patient name: William Wells            MRN: [id number]        DOB: 27-Apr-1934            Sex: male   REASON FOR CONSULT: Temporal artery biopsy   HPI: William Wells is a 89 y.o. male, with history of hypertension, diabetes, CKD stage 3B, lung cancer presents for evaluation of temporal artery biopsy.  In review of the notes, patient has had right sided temporal headaches with scalp tenderness.  Patient states has had chronic headaches on the left side for years.  More recently has developed tenderness over his right temporal region and right side.  This tends to wax and wane.  Also was diagnosed with macular degeneration and has had injections in the right eye with vision changes since earlier this year.  He did have labs including a sed rate and CRP - I do not have these results.       Past Medical History:  Diagnosis Date   Acid reflux disease     Arthritis     Complication of anesthesia      appendix surgery (89 year old) - passed out once patient got back to floor unit   Diabetes mellitus     Hiatal hernia     High cholesterol     Hypertension     Hypothyroidism     Monoclonal gammopathy of unknown significance (MGUS) 03/28/2020   Normocytic anemia 03/28/2020   Renal disorder      renal insufficiency   Type 2 diabetes  mellitus [facility])                 Past Surgical History:  Procedure Laterality Date   APPENDECTOMY       BRONCHIAL BIOPSY   05/29/2020    Procedure: BRONCHIAL BIOPSIES;  Surgeon: Leslye Peer, MD;  [facility] END[facility];  Service: Pulmonary;;   BRONCHIAL BRUSHINGS   05/29/2020    Procedure: BRONCHIAL BRUSHINGS;  Surgeon: Leslye Peer, MD;  Location: [facility] ENDOSCOPY;  Service: Pulmonary;;   BRONCHIAL NEEDLE ASPIRATION BIOPSY   05/29/2020    Procedure: BRONCHIAL NEEDLE ASPIRATION BIOPSIES;  Surgeon: Leslye Peer, MD;  Location: [facility] ENDOSCOPY;  Service: Pulmonary;;   BRONCHIAL WASHINGS   05/29/2020    Procedure: BRONCHIAL WASHINGS;  Surgeon: Leslye Peer, MD;  Location: [facility] ENDOSCOPY;  Service: Pulmonary;;   CATARACT EXTRACTION W/PHACO Left 01/23/2023    Procedure: CATARACT EXTRACTION PHACO AND INTRAOCULAR LENS PLACEMENT (IOC);  Surgeon: Fabio Pierce, MD;  Location: [facility] ORS;  Service: Ophthalmology;  Laterality: Left;  CDE: 13.69   CATARACT EXTRACTION W/PHACO Right 02/20/2023    Procedure: CATARACT EXTRACTION PHACO AND INTRAOCULAR LENS PLACEMENT (IOC);  Surgeon: Fabio Pierce, MD;  Location: [facility] ORS;  Service: Ophthalmology;  Laterality: Right;  CDE:  12.88   EYE SURGERY       INTERCOSTAL NERVE BLOCK Left 06/22/2020    Procedure: INTERCOSTAL NERVE BLOCK;  Surgeon: Loreli Slot, MD;  Location: [facility] OR;  Service: Thoracic;  Laterality: Left;   NODE DISSECTION Left 06/22/2020    Procedure: NODE DISSECTION;  Surgeon: Loreli Slot, MD;  Location: [facility] OR;  Service: Thoracic;  Laterality: Left;   TONSILLECTOMY       VIDEO BRONCHOSCOPY WITH ENDOBRONCHIAL NAVIGATION N/A 05/29/2020    Procedure: VIDEO BRONCHOSCOPY WITH ENDOBRONCHIAL NAVIGATION;  Surgeon: Leslye Peer, MD;  [facility] ENDOSCOPY;  Service: Pulmonary;  Laterality: N/A;               Family History  Problem Relation Age of Onset   Diabetes Mother     Cancer Father     Cancer Sister     Diabetes Brother      Cancer Brother     Diabetes Brother     Parkinson's disease Brother     Diabetes Brother            SOCIAL HISTORY: Social History         Socioeconomic History   Marital status: Widowed      Spouse name: Not on file   Number of children: 4   Years of education: Not on file   Highest education level: Not on file  Occupational History   Occupation: RETIRED  Tobacco Use   Smoking status: Former      Current packs/day: 0.00      Average packs/day: 0.5 packs/day for 47.0 years (23.5 ttl pk-yrs)      Types: Cigarettes      Start date: 27      Quit date: 2000      Years since quitting: 25.1   Smokeless tobacco: Never  Vaping Use   Vaping status: Never Used  Substance and Sexual Activity   Alcohol use: No   Drug use: No   Sexual activity: Not Currently  Other Topics Concern   Not on file  Social History Narrative   Not on file    Social Drivers of Health        Financial Resource Strain: Low Risk  (10/25/2020)    Overall Financial Resource Strain (CARDIA)     Difficulty of Paying Living Expenses: Not hard at all  Food Insecurity: No Food Insecurity (10/25/2020)    Hunger Vital Sign     Worried About Running Out of Food in the Last Year: Never true     Ran Out of Food in the Last Year: Never true  Transportation Needs: No Transportation Needs (10/25/2020)    PRAPARE - Therapist, art (Medical): No     Lack of Transportation (Non-Medical): No  Physical Activity: Sufficiently Active (10/25/2020)    Exercise Vital Sign     Days of Exercise per Week: 7 days     Minutes of Exercise per Session: 30 min  Stress: No Stress Concern Present (10/25/2020)    [employer] of Occupational Health - Occupational Stress Questionnaire     Feeling of Stress : Only a little  Social Connections: Moderately Integrated (10/25/2020)    Social Connection and Isolation Panel [NHANES]     Frequency of Communication with Friends and Family: More than three times  a week     Frequency of Social Gatherings with Friends and Family: More than three times a week     Attends Religious  Services: More than 4 times per year     Active Member of Clubs or Organizations: No     Attends Banker Meetings: Not on file     Marital Status: Married  Intimate Partner Violence: Not At Risk (10/25/2020)    Humiliation, Afraid, Rape, and Kick questionnaire     Fear of Current or Ex-Partner: No     Emotionally Abused: No     Physically Abused: No     Sexually Abused: No      Allergies  No Known Allergies           Current Outpatient Medications  Medication Sig Dispense Refill   acetaminophen (TYLENOL) 500 MG tablet Take 2 tablets (1,000 mg total) by mouth every 6 (six) hours. 30 tablet 0   docusate sodium (COLACE) 100 MG capsule Take 1 capsule (100 mg total) by mouth 2 (two) times daily as needed for mild constipation. 10 capsule 0   gabapentin (NEURONTIN) 300 MG capsule Take 300 mg by mouth 3 (three) times daily.       glipiZIDE (GLUCOTROL XL) 2.5 MG 24 hr tablet Take 2.5 mg by mouth daily as needed (blood sugar above 160).        HYDROcodone-acetaminophen (NORCO/VICODIN) 5-325 MG tablet Take 1 tablet by mouth every 8 (eight) hours as needed. 15 tablet 0   Lancets (ONETOUCH DELICA PLUS LANCET30G) MISC 2 (two) times daily.       levothyroxine (SYNTHROID) 75 MCG tablet Take 75 mcg by mouth daily.       LOKELMA 5 g packet Take by mouth.       methocarbamol (ROBAXIN) 500 MG tablet Take 1 tablet (500 mg total) by mouth 2 (two) times daily. 20 tablet 0   Multiple Vitamin (MULTIVITAMIN ADULT PO) Take by mouth.       Multiple Vitamins-Minerals (PRESERVISION AREDS PO) Take by mouth.       omeprazole (PRILOSEC) 20 MG capsule Take 20 mg by mouth daily.       ONETOUCH ULTRA test strip 2 (two) times daily.       oxyCODONE (ROXICODONE) 5 MG immediate release tablet Take 1 tablet (5 mg total) by mouth every 6 (six) hours as needed for severe pain (pain score 7-10).  20 tablet 0   oxyCODONE-acetaminophen (PERCOCET) 5-325 MG tablet Take 1 tablet by mouth every 6 (six) hours as needed. 10 tablet 0   rosuvastatin (CRESTOR) 40 MG tablet Take 40 mg by mouth daily.        telmisartan (MICARDIS) 20 MG tablet Take 10 mg by mouth daily.       VITAMIN D PO Take 2,000 Int'l Units by mouth daily.       VITAMIN E PO Take by mouth.          No current facility-administered medications for this visit.        REVIEW OF SYSTEMS:  [X]  denotes positive finding, [ ]  denotes negative finding Cardiac   Comments:  Chest pain or chest pressure:      Shortness of breath upon exertion:      Short of breath when lying flat:      Irregular heart rhythm:             Vascular      Pain in calf, thigh, or hip brought on by ambulation:      Pain in feet at night that wakes you up from your sleep:       Blood clot in  your veins:      Leg swelling:              Pulmonary      Oxygen at home:      Productive cough:       Wheezing:              Neurologic      Sudden weakness in arms or legs:       Sudden numbness in arms or legs:       Sudden onset of difficulty speaking or slurred speech:      Temporary loss of vision in one eye:       Problems with dizziness:              Gastrointestinal      Blood in stool:       Vomited blood:              Genitourinary      Burning when urinating:       Blood in urine:             Psychiatric      Major depression:              Hematologic      Bleeding problems:      Problems with blood clotting too easily:             Skin      Rashes or ulcers:             Constitutional      Fever or chills:          PHYSICAL EXAM: There were no vitals filed for this visit.   GENERAL: The patient is a well-nourished male, in no acute distress. The vital signs are documented above. CARDIAC: There is a regular rate and rhythm.  VASCULAR:  Bilateral radial pulses palpable Right scalp tenderness with palpable temporal  pulse No left sided scalp tenderness PULMONARY: No respiratory distress. ABDOMEN: Soft and non-tender. MUSCULOSKELETAL: There are no major deformities or cyanosis. NEUROLOGIC: No focal weakness or paresthesias are detected. SKIN: There are no ulcers or rashes noted. PSYCHIATRIC: The patient has a normal affect.   DATA:    N/A   Assessment/Plan:   89 y.o. male, with history of hypertension, diabetes, CKD stage 3B, lung cancer presents for evaluation of temporal artery biopsy.  His most recent symptoms involve right-sided scalp tenderness with new right-sided headaches (whereas his left-sided headaches are chronic in nature).  I have recommended a right temporal artery biopsy to evaluate for giant cell arteritis.  Discussed this being done as an outpatient at [facility].  Discussed incision along the hairline with risk of injury to the temporal branch of the facial nerve.  I discussed this is the gold standard for diagnosis and to determine if he needs a longer term steroid therapy.  I think he can cancel his temporal ultrasound in [place] this afternoon as I do not think it will direct management given he needs a biopsy to truly rule this out.  Will get scheduled next week at [facility].     Cephus Shelling, MD [facility] [place] Office: [phone]

## 2023-11-23 NOTE — Transfer of Care (Signed)
 Immediate Anesthesia Transfer of Care Note  Patient: William Wells  Procedure(s) Performed: RIGHT BIOPSY TEMPORAL ARTERY (Right: Face)  Patient Location: PACU  Anesthesia Type:MAC  Level of Consciousness: awake, alert , and oriented  Airway & Oxygen Therapy: Patient Spontanous Breathing  Post-op Assessment: Report given to RN and Post -op Vital signs reviewed and stable  Post vital signs: Reviewed and stable  Last Vitals:  Vitals Value Taken Time  BP 106/55 11/23/23 1323  Temp    Pulse 56 11/23/23 1325  Resp 14 11/23/23 1325  SpO2 98 % 11/23/23 1325  Vitals shown include unfiled device data.  Last Pain:  Vitals:   11/23/23 1024  PainSc: 0-No pain      Patients Stated Pain Goal: 0 (11/23/23 1024)  Complications: No notable events documented.

## 2023-11-23 NOTE — Anesthesia Postprocedure Evaluation (Signed)
 Anesthesia Post Note  Patient: William Wells  Procedure(s) Performed: RIGHT BIOPSY TEMPORAL ARTERY (Right: Face)     Patient location during evaluation: PACU Anesthesia Type: MAC Level of consciousness: awake and alert, patient cooperative and oriented Pain management: pain level controlled Vital Signs Assessment: post-procedure vital signs reviewed and stable Respiratory status: nonlabored ventilation, spontaneous breathing and respiratory function stable Cardiovascular status: blood pressure returned to baseline and stable Postop Assessment: no apparent nausea or vomiting Anesthetic complications: no   No notable events documented.  Last Vitals:  Vitals:   11/23/23 1330 11/23/23 1345  BP: 120/61 (!) 108/47  Pulse: 63 (!) 57  Resp: 15 14  Temp:  36.5 C  SpO2: 98% 98%    Last Pain:  Vitals:   11/23/23 1345  PainSc: 0-No pain                 Shamal Stracener,E. Shaheen Mende

## 2023-11-24 ENCOUNTER — Encounter (HOSPITAL_COMMUNITY): Payer: Self-pay | Admitting: Vascular Surgery

## 2023-11-25 LAB — SURGICAL PATHOLOGY

## 2023-11-27 ENCOUNTER — Telehealth: Payer: Self-pay | Admitting: Vascular Surgery

## 2023-11-27 NOTE — Telephone Encounter (Signed)
 Left voicemail with the patient as well as voicemail with his daughter Rosey Bath giving them results that temporal artery biopsy results are negative.  Cephus Shelling, MD [facility] Office: [phone]   Cephus Shelling

## 2023-12-02 ENCOUNTER — Ambulatory Visit (HOSPITAL_COMMUNITY)
Admission: RE | Admit: 2023-12-02 | Discharge: 2023-12-02 | Disposition: A | Discharge status: Home / Self Care | Payer: Medicare Other | Source: Ambulatory Visit | Attending: Hematology | Admitting: Hematology

## 2023-12-02 ENCOUNTER — Inpatient Hospital Stay: Payer: Medicare Other | Attending: Hematology

## 2023-12-02 DIAGNOSIS — C3492 Malignant neoplasm of unspecified part of left bronchus or lung: Secondary | ICD-10-CM | POA: Insufficient documentation

## 2023-12-02 DIAGNOSIS — D472 Monoclonal gammopathy: Secondary | ICD-10-CM | POA: Insufficient documentation

## 2023-12-02 DIAGNOSIS — Z87891 Personal history of nicotine dependence: Secondary | ICD-10-CM | POA: Insufficient documentation

## 2023-12-02 DIAGNOSIS — C3490 Malignant neoplasm of unspecified part of unspecified bronchus or lung: Secondary | ICD-10-CM | POA: Diagnosis not present

## 2023-12-02 DIAGNOSIS — I700 Atherosclerosis of aorta: Secondary | ICD-10-CM | POA: Diagnosis not present

## 2023-12-02 DIAGNOSIS — J432 Centrilobular emphysema: Secondary | ICD-10-CM | POA: Diagnosis not present

## 2023-12-02 LAB — IRON AND TIBC
Iron: 68 ug/dL (ref 45–182)
Saturation Ratios: 22 % (ref 17.9–39.5)
TIBC: 305 ug/dL (ref 250–450)
UIBC: 237 ug/dL

## 2023-12-02 LAB — COMPREHENSIVE METABOLIC PANEL
ALT: 22 U/L (ref 0–44)
AST: 22 U/L (ref 15–41)
Albumin: 3.7 g/dL (ref 3.5–5.0)
Alkaline Phosphatase: 42 U/L (ref 38–126)
Anion gap: 10 (ref 5–15)
BUN: 27 mg/dL — ABNORMAL HIGH (ref 8–23)
CO2: 25 mmol/L (ref 22–32)
Calcium: 9.1 mg/dL (ref 8.9–10.3)
Chloride: 102 mmol/L (ref 98–111)
Creatinine, Ser: 1.91 mg/dL — ABNORMAL HIGH (ref 0.61–1.24)
GFR, Estimated: 34 mL/min — ABNORMAL LOW (ref >60)
Glucose, Bld: 186 mg/dL — ABNORMAL HIGH (ref 70–99)
Potassium: 4.5 mmol/L (ref 3.5–5.1)
Sodium: 137 mmol/L (ref 135–145)
Total Bilirubin: 0.7 mg/dL (ref 0.0–1.2)
Total Protein: 7.1 g/dL (ref 6.5–8.1)

## 2023-12-02 LAB — CBC WITH DIFFERENTIAL/PLATELET
Abs Immature Granulocytes: 0.02 10*3/uL (ref 0.00–0.07)
Basophils Absolute: 0.1 10*3/uL (ref 0.0–0.1)
Basophils Relative: 1 %
Eosinophils Absolute: 0.3 10*3/uL (ref 0.0–0.5)
Eosinophils Relative: 4 %
HCT: 35.3 % — ABNORMAL LOW (ref 39.0–52.0)
Hemoglobin: 11.6 g/dL — ABNORMAL LOW (ref 13.0–17.0)
Immature Granulocytes: 0 %
Lymphocytes Relative: 19 %
Lymphs Abs: 1.2 10*3/uL (ref 0.7–4.0)
MCH: 29.7 pg (ref 26.0–34.0)
MCHC: 32.9 g/dL (ref 30.0–36.0)
MCV: 90.3 fL (ref 80.0–100.0)
Monocytes Absolute: 0.5 10*3/uL (ref 0.1–1.0)
Monocytes Relative: 8 %
Neutro Abs: 4.2 10*3/uL (ref 1.7–7.7)
Neutrophils Relative %: 68 %
Platelets: 141 10*3/uL — ABNORMAL LOW (ref 150–400)
RBC: 3.91 MIL/uL — ABNORMAL LOW (ref 4.22–5.81)
RDW: 15.5 % (ref 11.5–15.5)
WBC: 6.3 10*3/uL (ref 4.0–10.5)
nRBC: 0 % (ref 0.0–0.2)

## 2023-12-02 LAB — FERRITIN: Ferritin: 66 ng/mL (ref 24–336)

## 2023-12-02 LAB — LACTATE DEHYDROGENASE: LDH: 106 U/L (ref 98–192)

## 2023-12-03 LAB — KAPPA/LAMBDA LIGHT CHAINS
Kappa free light chain: 55.9 mg/L — ABNORMAL HIGH (ref 3.3–19.4)
Kappa, lambda light chain ratio: 1.98 — ABNORMAL HIGH (ref 0.26–1.65)
Lambda free light chains: 28.3 mg/L — ABNORMAL HIGH (ref 5.7–26.3)

## 2023-12-07 LAB — PROTEIN ELECTROPHORESIS, SERUM
A/G Ratio: 1 (ref 0.7–1.7)
Albumin ELP: 3.6 g/dL (ref 2.9–4.4)
Alpha-1-Globulin: 0.3 g/dL (ref 0.0–0.4)
Alpha-2-Globulin: 0.9 g/dL (ref 0.4–1.0)
Beta Globulin: 0.9 g/dL (ref 0.7–1.3)
Gamma Globulin: 1.5 g/dL (ref 0.4–1.8)
Globulin, Total: 3.5 g/dL (ref 2.2–3.9)
M-Spike, %: 0.6 g/dL — ABNORMAL HIGH
Total Protein ELP: 7.1 g/dL (ref 6.0–8.5)

## 2023-12-08 NOTE — Progress Notes (Signed)
 [facility] [address] [place] [address]    Clinic Day:  12/09/2023  Referring physician: Benita Stabile, MD  Patient Care Team: Benita Stabile, MD as PCP - General (Internal Medicine) Mickie Bail, RN as Oncology Nurse Navigator (Oncology) Doreatha Massed, MD as Medical Oncologist (Medical Oncology)   ASSESSMENT & PLAN:   Assessment: 1.  IgM kappa MGUS: -Serum immunofixation shows IgM kappa. -SPEP was negative.  Free kappa light chains of 48.5, lambda light chains 24.8, ratio of 1.96. -Beta-2 microglobulin 3.2.  LDH 105. -Creatinine 1.95 and calcium 9.7. -24-hour urine total protein was 572 mg.  M spike was negative. -Skeletal survey on 03/29/2020 did not show any lytic lesions. -PET scan on 04/09/2020 did not show any skeletal abnormalities.   2.  PT 1 CPN 0 left lung adenocarcinoma: -He quit smoking in 1999, smoked about 46 pack years. -PET scan on 04/09/2020 showed 2.1 x 2.1 cm left apical nodule with SUV 2.2.  Possibility of low-grade adenocarcinoma. -Navigational bronchoscopy and biopsy of the left upper lobe lung lesion on 05/29/2020 consistent with adenocarcinoma. -Left upper lobectomy and lymph node biopsy on 06/22/2020.  Pathology shows invasive adenocarcinoma, 3 cm, no visceral pleural invasion identified.  Resection margins negative.  0/4 hilar lymph nodes negative for carcinoma. - CT chest with contrast on 10/24/2020 shows postoperative changes with moderate sized loculated appearing left pleural effusion with no enhancing pleural nodules.  Stable small scattered mediastinal and hilar lymph nodes.    Plan: 1.  IgM kappa MGUS: - Denies any new onset bone pains.  Denies any infections. - Reviewed labs from 12/02/2023: Creatinine stable at 1.91 and calcium is normal.  M spike is slightly increased to 0.6 from 0.5 g.  FLC ratio is stable at 1.98.  Kappa light chains are also stable at 55. - He does not have "crab" features to warrant treatment at  this time.  Will continue follow-up in 6 months with repeat myeloma labs and skeletal survey.   2.  Left lung adenocarcinoma: - He denies any respiratory infections in the past 6 months. - I have reviewed CT chest without contrast images from 09/30/2024: No significant changes.  Will await final radiology report.  3.  Iron deficiency anemia: - Ferritin is low at 66 and percent saturation 22.  Hemoglobin 11.6.  He is taking iron tablet on Monday and Thursday along with stool softener.  He will increase it to 3 times a week.  Will repeat ferritin and iron panel at next visit.    Orders Placed This Encounter  Procedures   CT CHEST WO CONTRAST    Standing Status:   Future    Expected Date:   06/10/2024    Expiration Date:   12/08/2024    Preferred imaging location?:   [facility]   DG Bone Survey Met    Standing Status:   Future    Expected Date:   06/10/2024    Expiration Date:   12/08/2024    Reason for Exam (SYMPTOM  OR DIAGNOSIS REQUIRED):   MGUS    Preferred imaging location?:   [facility]   CBC with Differential    Standing Status:   Future    Expected Date:   06/06/2024    Expiration Date:   12/08/2024   Comprehensive metabolic panel    Standing Status:   Future    Expected Date:   06/06/2024    Expiration Date:   12/08/2024  Iron and TIBC ([facility] ONLY)    Standing Status:   Future    Expected Date:   06/06/2024    Expiration Date:   12/08/2024   Ferritin    Standing Status:   Future    Expected Date:   06/06/2024    Expiration Date:   12/08/2024   Kappa/lambda light chains    Standing Status:   Future    Expected Date:   06/06/2024    Expiration Date:   12/08/2024   Protein electrophoresis, serum    Standing Status:   Future    Expected Date:   06/06/2024    Expiration Date:   12/08/2024      Mikeal Hawthorne R Teague,acting as a scribe for Doreatha Massed, MD.,have documented all relevant documentation on the behalf of Doreatha Massed, MD,as directed by   Doreatha Massed, MD while in the presence of Doreatha Massed, MD.  I, Doreatha Massed MD, have reviewed the above documentation for accuracy and completeness, and I agree with the above.    Doreatha Massed, MD   3/5/20251:50 PM  CHIEF COMPLAINT:   Diagnosis: left lung cancer and MGUS    Cancer Staging  Adenocarcinoma of left lung, stage 1 (HCC) Staging form: Lung, AJCC 8th Edition - Clinical stage from 06/26/2020: Stage IA3 (cT1c, cN0, cM0) - Signed by Loreli Slot, MD on 06/26/2020    Prior Therapy: Left upper lung lobectomy on 06/22/2020   Current Therapy:  surveillance   HISTORY OF PRESENT ILLNESS:   Oncology History  Adenocarcinoma of left lung, stage 1 (HCC)  06/26/2020 Initial Diagnosis   Adenocarcinoma of left lung, stage 1 (HCC)   06/26/2020 Cancer Staging   Staging form: Lung, AJCC 8th Edition - Clinical stage from 06/26/2020: Stage IA3 (cT1c, cN0, cM0) - Signed by Loreli Slot, MD on 06/26/2020      INTERVAL HISTORY:   William Wells is a 89 y.o. male presenting to clinic today for follow up of left lung cancer and MGUS. He was last seen by me on 06/03/23.  Since his last visit, he has CT chest on 12/02/23.  Of note, William Wells underwent biopsy of right temporal artery for a tension-type headache on 11/23/23. Pathology revealed: medium-sized artery with mild atherosclerotic changes negative for evidence of granulomas/chronic inflammatory changes.  Today, he states that he is doing well overall. His appetite level is at 100%. His energy level is at 75%.  PAST MEDICAL HISTORY:   Past Medical History: Past Medical History:  Diagnosis Date   Acid reflux disease    Arthritis    Complication of anesthesia    appendix surgery (89 year old) - passed out once patient got back to floor unit   Diabetes mellitus    Dyspnea    Family history of adverse reaction to anesthesia    daughter states that she has a difficult time waking up   Hiatal hernia     High cholesterol    Hypertension    Hypothyroidism    Lung cancer (HCC)    s/p XI robotic assisted thoracoscopy for LU lobectomy 06/22/20   Monoclonal gammopathy of unknown significance (MGUS) 03/28/2020   Normocytic anemia 03/28/2020   Renal disorder    renal insufficiency   Type 2 diabetes mellitus (HCC)     Surgical History: Past Surgical History:  Procedure Laterality Date   APPENDECTOMY     ARTERY BIOPSY Right 11/23/2023   Procedure: RIGHT BIOPSY TEMPORAL ARTERY;  Surgeon: Cephus Shelling, MD;  Location: [facility]  OR;  Service: Vascular;  Laterality: Right;   BRONCHIAL BIOPSY  05/29/2020   Procedure: BRONCHIAL BIOPSIES;  Surgeon: Leslye Peer, MD;  Location: [facility] ENDOSCOPY;  Service: Pulmonary;;   BRONCHIAL BRUSHINGS  05/29/2020   Procedure: BRONCHIAL BRUSHINGS;  Surgeon: Leslye Peer, MD;  Location: [facility] ENDOSCOPY;  Service: Pulmonary;;   BRONCHIAL NEEDLE ASPIRATION BIOPSY  05/29/2020   Procedure: BRONCHIAL NEEDLE ASPIRATION BIOPSIES;  Surgeon: Leslye Peer, MD;  Location: [facility] ENDOSCOPY;  Service: Pulmonary;;   BRONCHIAL WASHINGS  05/29/2020   Procedure: BRONCHIAL WASHINGS;  Surgeon: Leslye Peer, MD;  Location: [facility] ENDOSCOPY;  Service: Pulmonary;;   CATARACT EXTRACTION W/PHACO Left 01/23/2023   Procedure: CATARACT EXTRACTION PHACO AND INTRAOCULAR LENS PLACEMENT (IOC);  Surgeon: Fabio Pierce, MD;  Location: [facility] ORS;  Service: Ophthalmology;  Laterality: Left;  CDE: 13.69   CATARACT EXTRACTION W/PHACO Right 02/20/2023   Procedure: CATARACT EXTRACTION PHACO AND INTRAOCULAR LENS PLACEMENT (IOC);  Surgeon: Fabio Pierce, MD;  Location: [facility] ORS;  Service: Ophthalmology;  Laterality: Right;  CDE: 12.88   CERVICAL SPINE SURGERY     EYE SURGERY     strabismus   INTERCOSTAL NERVE BLOCK Left 06/22/2020   Procedure: INTERCOSTAL NERVE BLOCK;  Surgeon: Loreli Slot, MD;  Location: [facility] OR;  Service: Thoracic;  Laterality: Left;   NODE DISSECTION Left 06/22/2020   Procedure:  NODE DISSECTION;  Surgeon: Loreli Slot, MD;  Location: [facility] OR;  Service: Thoracic;  Laterality: Left;   TONSILLECTOMY     VIDEO BRONCHOSCOPY WITH ENDOBRONCHIAL NAVIGATION N/A 05/29/2020   Procedure: VIDEO BRONCHOSCOPY WITH ENDOBRONCHIAL NAVIGATION;  Surgeon: Leslye Peer, MD;  Location: [facility] ENDOSCOPY;  Service: Pulmonary;  Laterality: N/A;    Social History: Social History   Socioeconomic History   Marital status: Widowed    Spouse name: Not on file   Number of children: 4   Years of education: Not on file   Highest education level: Not on file  Occupational History   Occupation: RETIRED  Tobacco Use   Smoking status: Former    Current packs/day: 0.00    Average packs/day: 0.5 packs/day for 47.0 years (23.5 ttl pk-yrs)    Types: Cigarettes    Start date: 27    Quit date: 2000    Years since quitting: 25.1   Smokeless tobacco: Never  Vaping Use   Vaping status: Never Used  Substance and Sexual Activity   Alcohol use: No   Drug use: No   Sexual activity: Not Currently  Other Topics Concern   Not on file  Social History Narrative   Not on file   Social Drivers of Health   Financial Resource Strain: Low Risk  (10/25/2020)   Overall Financial Resource Strain (CARDIA)    Difficulty of Paying Living Expenses: Not hard at all  Food Insecurity: No Food Insecurity (10/25/2020)   Hunger Vital Sign    Worried About Running Out of Food in the Last Year: Never true    Ran Out of Food in the Last Year: Never true  Transportation Needs: No Transportation Needs (10/25/2020)   PRAPARE - Administrator, Civil Service (Medical): No    Lack of Transportation (Non-Medical): No  Physical Activity: Sufficiently Active (10/25/2020)   Exercise Vital Sign    Days of Exercise per Week: 7 days    Minutes of Exercise per Session: 30 min  Stress: No Stress Concern Present (10/25/2020)   [employer] of Occupational Health - Occupational Stress Questionnaire  Feeling  of Stress : Only a little  Social Connections: Moderately Integrated (10/25/2020)   Social Connection and Isolation Panel [NHANES]    Frequency of Communication with Friends and Family: More than three times a week    Frequency of Social Gatherings with Friends and Family: More than three times a week    Attends Religious Services: More than 4 times per year    Active Member of [employer] or Organizations: No    Attends Banker Meetings: Not on file    Marital Status: Married  Catering manager Violence: Not At Risk (10/25/2020)   Humiliation, Afraid, Rape, and Kick questionnaire    Fear of Current or Ex-Partner: No    Emotionally Abused: No    Physically Abused: No    Sexually Abused: No    Family History: Family History  Problem Relation Age of Onset   Diabetes Mother    Cancer Father    Cancer Sister    Diabetes Brother    Cancer Brother    Diabetes Brother    Parkinson's disease Brother    Diabetes Brother     Current Medications:  Current Outpatient Medications:    acetaminophen (TYLENOL) 500 MG tablet, Take 2 tablets (1,000 mg total) by mouth every 6 (six) hours., Disp: 30 tablet, Rfl: 0   carboxymethylcellulose 1 % ophthalmic solution, Apply 1 drop to eye 2 (two) times daily., Disp: , Rfl:    diclofenac Sodium (VOLTAREN) 1 % GEL, Apply 2 g topically daily as needed (pain)., Disp: , Rfl:    docusate sodium (COLACE) 100 MG capsule, Take 1 capsule (100 mg total) by mouth 2 (two) times daily as needed for mild constipation. (Patient taking differently: Take 100 mg by mouth 2 (two) times a week.), Disp: 10 capsule, Rfl: 0   ferrous sulfate 325 (65 FE) MG tablet, Take 325 mg by mouth 2 (two) times a week., Disp: , Rfl:    gabapentin (NEURONTIN) 300 MG capsule, Take 300 mg by mouth 3 (three) times daily., Disp: , Rfl:    glipiZIDE (GLUCOTROL XL) 2.5 MG 24 hr tablet, Take 2.5 mg by mouth daily as needed (blood sugar above 160). , Disp: , Rfl:    Lancets (ONETOUCH DELICA  PLUS LANCET30G) MISC, 2 (two) times daily., Disp: , Rfl:    levothyroxine (SYNTHROID) 75 MCG tablet, Take 75 mcg by mouth daily before breakfast., Disp: , Rfl:    LOKELMA 5 g packet, Take 5 g by mouth every Monday, Wednesday, and Friday., Disp: , Rfl:    methocarbamol (ROBAXIN) 500 MG tablet, Take 1 tablet (500 mg total) by mouth 2 (two) times daily., Disp: 20 tablet, Rfl: 0   Multiple Vitamin (MULTIVITAMIN ADULT PO), Take 1 tablet by mouth daily., Disp: , Rfl:    Multiple Vitamins-Minerals (PRESERVISION AREDS PO), Take 1 tablet by mouth in the morning and at bedtime., Disp: , Rfl:    omeprazole (PRILOSEC) 20 MG capsule, Take 20 mg by mouth daily., Disp: , Rfl:    ONETOUCH ULTRA test strip, 2 (two) times daily., Disp: , Rfl:    oxyCODONE (ROXICODONE) 5 MG immediate release tablet, Take 1 tablet (5 mg total) by mouth every 6 (six) hours as needed for severe pain (pain score 7-10)., Disp: 10 tablet, Rfl: 0   rosuvastatin (CRESTOR) 40 MG tablet, Take 40 mg by mouth daily. , Disp: , Rfl:    telmisartan (MICARDIS) 20 MG tablet, Take 10 mg by mouth daily., Disp: , Rfl:    VITAMIN  D PO, Take 2,000 Units by mouth daily., Disp: , Rfl:    VITAMIN E PO, Take 1 capsule by mouth daily., Disp: , Rfl:    Allergies: No Known Allergies  REVIEW OF SYSTEMS:   Review of Systems  Constitutional:  Negative for chills, fatigue and fever.  HENT:   Negative for lump/mass, mouth sores, nosebleeds, sore throat and trouble swallowing.   Eyes:  Negative for eye problems.  Respiratory:  Positive for shortness of breath. Negative for cough.   Cardiovascular:  Positive for palpitations. Negative for chest pain and leg swelling.  Gastrointestinal:  Negative for abdominal pain, constipation, diarrhea, nausea and vomiting.  Genitourinary:  Negative for bladder incontinence, difficulty urinating, dysuria, frequency, hematuria and nocturia.   Musculoskeletal:  Negative for arthralgias, back pain, flank pain, myalgias and neck  pain.  Skin:  Negative for itching and rash.  Neurological:  Positive for headaches and numbness. Negative for dizziness.  Hematological:  Does not bruise/bleed easily.  Psychiatric/Behavioral:  Positive for sleep disturbance. Negative for depression and suicidal ideas. The patient is not nervous/anxious.   All other systems reviewed and are negative.    VITALS:   Blood pressure (!) 134/112, pulse 64, temperature 97.8 F (36.6 C), temperature source Oral, resp. rate 16, weight 177 lb 0.5 oz (80.3 kg), SpO2 98%.  Wt Readings from Last 3 Encounters:  12/09/23 177 lb 0.5 oz (80.3 kg)  11/23/23 175 lb (79.4 kg)  11/17/23 175 lb 4.8 oz (79.5 kg)    Body mass index is 26.92 kg/m.  Performance status (ECOG): 1 - Symptomatic but completely ambulatory  PHYSICAL EXAM:   Physical Exam Vitals and nursing note reviewed. Exam conducted with a chaperone present.  Constitutional:      Appearance: Normal appearance.  Cardiovascular:     Rate and Rhythm: Normal rate and regular rhythm.     Pulses: Normal pulses.     Heart sounds: Normal heart sounds.  Pulmonary:     Effort: Pulmonary effort is normal.     Breath sounds: Normal breath sounds.  Abdominal:     Palpations: Abdomen is soft. There is no hepatomegaly, splenomegaly or mass.     Tenderness: There is no abdominal tenderness.  Musculoskeletal:     Right lower leg: No edema.     Left lower leg: No edema.  Lymphadenopathy:     Cervical: No cervical adenopathy.     Right cervical: No superficial, deep or posterior cervical adenopathy.    Left cervical: No superficial, deep or posterior cervical adenopathy.     Upper Body:     Right upper body: No supraclavicular or axillary adenopathy.     Left upper body: No supraclavicular or axillary adenopathy.  Neurological:     General: No focal deficit present.     Mental Status: He is alert and oriented to person, place, and time.  Psychiatric:        Mood and Affect: Mood normal.         Behavior: Behavior normal.     LABS:      Latest Ref Rng & Units 12/02/2023    3:24 PM 11/17/2023   12:05 PM 05/14/2023    1:42 PM  CBC  WBC 4.0 - 10.5 K/uL 6.3  7.9  6.7   Hemoglobin 13.0 - 17.0 g/dL 16.1  09.6  04.5   Hematocrit 39.0 - 52.0 % 35.3  35.5  35.7   Platelets 150 - 400 K/uL 141  160  166  Latest Ref Rng & Units 12/02/2023    3:24 PM 11/17/2023   12:05 PM 05/14/2023    1:42 PM  CMP  Glucose 70 - 99 mg/dL 161  096  045   BUN 8 - 23 mg/dL 27  32  29   Creatinine 0.61 - 1.24 mg/dL 4.09  8.11  9.14   Sodium 135 - 145 mmol/L 137  138  138   Potassium 3.5 - 5.1 mmol/L 4.5  5.1  5.0   Chloride 98 - 111 mmol/L 102  109  104   CO2 22 - 32 mmol/L 25  25  25    Calcium 8.9 - 10.3 mg/dL 9.1  8.8  9.3   Total Protein 6.5 - 8.1 g/dL 7.1   7.3   Total Bilirubin 0.0 - 1.2 mg/dL 0.7   0.3   Alkaline Phos 38 - 126 U/L 42   50   AST 15 - 41 U/L 22   28   ALT 0 - 44 U/L 22   41      No results found for: "CEA1", "CEA" / No results found for: "CEA1", "CEA" No results found for: "PSA1" No results found for: "CAN199" No results found for: "CAN125"  Lab Results  Component Value Date   TOTALPROTELP 7.1 12/02/2023   ALBUMINELP 3.6 12/02/2023   A1GS 0.3 12/02/2023   A2GS 0.9 12/02/2023   BETS 0.9 12/02/2023   GAMS 1.5 12/02/2023   MSPIKE 0.6 (H) 12/02/2023   SPEI Comment 12/02/2023   Lab Results  Component Value Date   TIBC 305 12/02/2023   TIBC 277 05/14/2023   TIBC 308 11/13/2022   FERRITIN 66 12/02/2023   FERRITIN 141 05/14/2023   FERRITIN 66 11/13/2022   IRONPCTSAT 22 12/02/2023   IRONPCTSAT 22 05/14/2023   IRONPCTSAT 17 (L) 11/13/2022   Lab Results  Component Value Date   LDH 106 12/02/2023   LDH 112 05/14/2023   LDH 109 11/13/2022     STUDIES:   No results found.

## 2023-12-09 ENCOUNTER — Inpatient Hospital Stay: Payer: Medicare Other | Attending: Hematology | Admitting: Hematology

## 2023-12-09 VITALS — BP 134/112 | HR 64 | Temp 97.8°F | Resp 16 | Wt 177.0 lb

## 2023-12-09 DIAGNOSIS — I10 Essential (primary) hypertension: Secondary | ICD-10-CM | POA: Diagnosis not present

## 2023-12-09 DIAGNOSIS — D508 Other iron deficiency anemias: Secondary | ICD-10-CM | POA: Diagnosis not present

## 2023-12-09 DIAGNOSIS — E7800 Pure hypercholesterolemia, unspecified: Secondary | ICD-10-CM | POA: Diagnosis not present

## 2023-12-09 DIAGNOSIS — M50322 Other cervical disc degeneration at C5-C6 level: Secondary | ICD-10-CM | POA: Diagnosis not present

## 2023-12-09 DIAGNOSIS — C3492 Malignant neoplasm of unspecified part of left bronchus or lung: Secondary | ICD-10-CM | POA: Insufficient documentation

## 2023-12-09 DIAGNOSIS — K219 Gastro-esophageal reflux disease without esophagitis: Secondary | ICD-10-CM | POA: Insufficient documentation

## 2023-12-09 DIAGNOSIS — E119 Type 2 diabetes mellitus without complications: Secondary | ICD-10-CM | POA: Insufficient documentation

## 2023-12-09 DIAGNOSIS — Z79899 Other long term (current) drug therapy: Secondary | ICD-10-CM | POA: Diagnosis not present

## 2023-12-09 DIAGNOSIS — E039 Hypothyroidism, unspecified: Secondary | ICD-10-CM | POA: Insufficient documentation

## 2023-12-09 DIAGNOSIS — D509 Iron deficiency anemia, unspecified: Secondary | ICD-10-CM | POA: Insufficient documentation

## 2023-12-09 DIAGNOSIS — D472 Monoclonal gammopathy: Secondary | ICD-10-CM | POA: Insufficient documentation

## 2023-12-09 DIAGNOSIS — M4722 Other spondylosis with radiculopathy, cervical region: Secondary | ICD-10-CM | POA: Diagnosis not present

## 2023-12-09 DIAGNOSIS — M4802 Spinal stenosis, cervical region: Secondary | ICD-10-CM | POA: Diagnosis not present

## 2023-12-09 DIAGNOSIS — M4803 Spinal stenosis, cervicothoracic region: Secondary | ICD-10-CM | POA: Diagnosis not present

## 2023-12-09 DIAGNOSIS — M5023 Other cervical disc displacement, cervicothoracic region: Secondary | ICD-10-CM | POA: Diagnosis not present

## 2023-12-09 NOTE — Patient Instructions (Addendum)
 [facility] at [facility] Discharge Instructions   You were seen and examined today by Dr. Ellin Saba.  He reviewed the results of your lab work which are mostly normal/stable. Your iron has dropped again. Increase iron to 3 days a week.   He reviewed the images of your CT scan which does not show any evidence of cancer. We do not have the official report from the radiologist yet. We will call you if anything is out of the ordinary.   We will see you back in .   Return as scheduled.    Thank you for choosing [facility] at [facility] to provide your oncology and hematology care.  To afford each patient quality time with our provider, please arrive at least 15 minutes before your scheduled appointment time.   If you have a lab appointment with the Cancer Center please come in thru the Main Entrance and check in at the main information desk.  You need to re-schedule your appointment should you arrive 10 or more minutes late.  We strive to give you quality time with our providers, and arriving late affects you and other patients whose appointments are after yours.  Also, if you no show three or more times for appointments you may be dismissed from the clinic at the providers discretion.     Again, thank you for choosing [facility].  Our hope is that these requests will decrease the amount of time that you wait before being seen by our physicians.       _____________________________________________________________  Should you have questions after your visit to [facility], please contact our office at [phone] and follow the prompts.  Our office hours are 8:00 a.m. and 4:30 p.m. Monday - Friday.  Please note that voicemails left after 4:00 p.m. may not be returned until the following business day.  We are closed weekends and major holidays.  You do have access to a nurse 24-7, just call the main number to the clinic  [phone] and do not press any options, hold on the line and a nurse will answer the phone.    For prescription refill requests, have your pharmacy contact our office and allow 72 hours.    Due to Covid, you will need to wear a mask upon entering the hospital. If you do not have a mask, a mask will be given to you at the Main Entrance upon arrival. For doctor visits, patients may have 1 support person age 6 or older with them. For treatment visits, patients can not have anyone with them due to social distancing guidelines and our immunocompromised population.

## 2023-12-23 DIAGNOSIS — Z20822 Contact with and (suspected) exposure to covid-19: Secondary | ICD-10-CM | POA: Diagnosis not present

## 2023-12-23 DIAGNOSIS — J019 Acute sinusitis, unspecified: Secondary | ICD-10-CM | POA: Diagnosis not present

## 2023-12-23 DIAGNOSIS — R0600 Dyspnea, unspecified: Secondary | ICD-10-CM | POA: Diagnosis not present

## 2023-12-24 DIAGNOSIS — H3581 Retinal edema: Secondary | ICD-10-CM | POA: Diagnosis not present

## 2023-12-24 DIAGNOSIS — H43813 Vitreous degeneration, bilateral: Secondary | ICD-10-CM | POA: Diagnosis not present

## 2023-12-24 DIAGNOSIS — H353122 Nonexudative age-related macular degeneration, left eye, intermediate dry stage: Secondary | ICD-10-CM | POA: Diagnosis not present

## 2023-12-24 DIAGNOSIS — H353211 Exudative age-related macular degeneration, right eye, with active choroidal neovascularization: Secondary | ICD-10-CM | POA: Diagnosis not present

## 2023-12-24 DIAGNOSIS — H35033 Hypertensive retinopathy, bilateral: Secondary | ICD-10-CM | POA: Diagnosis not present

## 2024-01-05 ENCOUNTER — Ambulatory Visit: Payer: Medicare Other | Attending: Internal Medicine | Admitting: Internal Medicine

## 2024-01-05 ENCOUNTER — Encounter: Payer: Self-pay | Admitting: Internal Medicine

## 2024-01-05 VITALS — BP 124/62 | HR 68 | Ht 67.0 in | Wt 175.0 lb

## 2024-01-05 DIAGNOSIS — E785 Hyperlipidemia, unspecified: Secondary | ICD-10-CM | POA: Insufficient documentation

## 2024-01-05 DIAGNOSIS — I441 Atrioventricular block, second degree: Secondary | ICD-10-CM | POA: Diagnosis not present

## 2024-01-05 DIAGNOSIS — R0602 Shortness of breath: Secondary | ICD-10-CM

## 2024-01-05 DIAGNOSIS — R809 Proteinuria, unspecified: Secondary | ICD-10-CM | POA: Diagnosis not present

## 2024-01-05 DIAGNOSIS — E782 Mixed hyperlipidemia: Secondary | ICD-10-CM | POA: Diagnosis not present

## 2024-01-05 DIAGNOSIS — R0609 Other forms of dyspnea: Secondary | ICD-10-CM | POA: Diagnosis not present

## 2024-01-05 DIAGNOSIS — D631 Anemia in chronic kidney disease: Secondary | ICD-10-CM | POA: Diagnosis not present

## 2024-01-05 DIAGNOSIS — N189 Chronic kidney disease, unspecified: Secondary | ICD-10-CM | POA: Diagnosis not present

## 2024-01-05 NOTE — Patient Instructions (Signed)
 Medication Instructions:  Your physician recommends that you continue on your current medications as directed. Please refer to the Current Medication list given to you today.  *If you need a refill on your cardiac medications before your next appointment, please call your pharmacy*  Lab Work: None If you have labs (blood work) drawn today and your tests are completely normal, you will receive your results only by: MyChart Message (if you have MyChart) OR A paper copy in the mail If you have any lab test that is abnormal or we need to change your treatment, we will call you to review the results.  Testing/Procedures: Your physician has requested that you have en exercise stress myoview. For further information please visit [email or web address] Please follow instruction sheet, as given.    Follow-Up: At [facility], you and your health needs are our priority.  As part of our continuing mission to provide you with exceptional heart care, our providers are all part of one team.  This team includes your primary Cardiologist (physician) and Advanced Practice Providers or APPs (Physician Assistants and Nurse Practitioners) who all work together to provide you with the care you need, when you need it.  Your next appointment:    Follow up is pending testing results  Provider:   You may see Vishnu P Mallipeddi, MD or one of the following Advanced Practice Providers on your designated Care Team:   [place], PA-C  Scotesia [place], [place] Jacolyn Reedy, [place]     We recommend signing up for the patient portal called "MyChart".  Sign up information is provided on this After Visit Summary.  MyChart is used to connect with patients for Virtual Visits (Telemedicine).  Patients are able to view lab/test results, encounter notes, upcoming appointments, etc.  Non-urgent messages can be sent to your provider as well.   To learn more about what you can do with MyChart, go to  [email or web address].   Other Instructions

## 2024-01-05 NOTE — Progress Notes (Signed)
 Cardiology Office Note  Date: 01/05/2024   ID: Fabrizzio, Marcella 1934/03/22, MRN [id number]  PCP:  Benita Stabile, MD  Cardiologist:  None Electrophysiologist:  None   History of Present Illness: William Wells is a 89 y.o. male known to have CKD stage IIIb, HTN, HLD, lung cancer s/p lobectomy in 2021, DM 2 was referred to cardiology clinic for evaluation of DOE.  Patient reported that he had lung cancer followed by lobectomy in 2021 after which he noticed having SOB but worsened in the last 1 year.  He notices shortness of breath mainly with overexertion activities like walking his dog for a long time, taking a shower for long time etc.  He does not have any SOB with ADLs.  No orthopnea, PND or leg swelling.  No chest pain, palpitations, syncope.  Occasional dizziness.  EKG from February 2025 showed NSR, second-degree AV block Mobitz type I.  Fatigue in the a.m. but clears towards the end of the day.  Echocardiogram performed in December 2024 showed normal LVEF, normal RV function, indeterminate diastology, CVP 3 mmHg.  Past Medical History:  Diagnosis Date   Acid reflux disease    Arthritis    Complication of anesthesia    appendix surgery (89 year old) - passed out once patient got back to floor unit   Diabetes mellitus    Dyspnea    Family history of adverse reaction to anesthesia    daughter states that she has a difficult time waking up   Hiatal hernia    High cholesterol    Hypertension    Hypothyroidism    Lung cancer (HCC)    s/p XI robotic assisted thoracoscopy for LU lobectomy 06/22/20   Monoclonal gammopathy of unknown significance (MGUS) 03/28/2020   Normocytic anemia 03/28/2020   Renal disorder    renal insufficiency   Type 2 diabetes mellitus (HCC)     Past Surgical History:  Procedure Laterality Date   APPENDECTOMY     ARTERY BIOPSY Right 11/23/2023   Procedure: RIGHT BIOPSY TEMPORAL ARTERY;  Surgeon: Cephus Shelling, MD;  Location: [facility] OR;  Service:  Vascular;  Laterality: Right;   BRONCHIAL BIOPSY  05/29/2020   Procedure: BRONCHIAL BIOPSIES;  Surgeon: Leslye Peer, MD;  Location: [facility] ENDOSCOPY;  Service: Pulmonary;;   BRONCHIAL BRUSHINGS  05/29/2020   Procedure: BRONCHIAL BRUSHINGS;  Surgeon: Leslye Peer, MD;  Location: [facility] ENDOSCOPY;  Service: Pulmonary;;   BRONCHIAL NEEDLE ASPIRATION BIOPSY  05/29/2020   Procedure: BRONCHIAL NEEDLE ASPIRATION BIOPSIES;  Surgeon: Leslye Peer, MD;  Location: [facility] ENDOSCOPY;  Service: Pulmonary;;   BRONCHIAL WASHINGS  05/29/2020   Procedure: BRONCHIAL WASHINGS;  Surgeon: Leslye Peer, MD;  Location: [facility] ENDOSCOPY;  Service: Pulmonary;;   CATARACT EXTRACTION W/PHACO Left 01/23/2023   Procedure: CATARACT EXTRACTION PHACO AND INTRAOCULAR LENS PLACEMENT (IOC);  Surgeon: Fabio Pierce, MD;  Location: [facility] ORS;  Service: Ophthalmology;  Laterality: Left;  CDE: 13.69   CATARACT EXTRACTION W/PHACO Right 02/20/2023   Procedure: CATARACT EXTRACTION PHACO AND INTRAOCULAR LENS PLACEMENT (IOC);  Surgeon: Fabio Pierce, MD;  Location: [facility] ORS;  Service: Ophthalmology;  Laterality: Right;  CDE: 12.88   CERVICAL SPINE SURGERY     EYE SURGERY     strabismus   INTERCOSTAL NERVE BLOCK Left 06/22/2020   Procedure: INTERCOSTAL NERVE BLOCK;  Surgeon: Loreli Slot, MD;  Location: [facility] OR;  Service: Thoracic;  Laterality: Left;   NODE DISSECTION Left 06/22/2020   Procedure: NODE DISSECTION;  Surgeon: Loreli Slot, MD;  Location: [facility] OR;  Service: Thoracic;  Laterality: Left;   TONSILLECTOMY     VIDEO BRONCHOSCOPY WITH ENDOBRONCHIAL NAVIGATION N/A 05/29/2020   Procedure: VIDEO BRONCHOSCOPY WITH ENDOBRONCHIAL NAVIGATION;  Surgeon: Leslye Peer, MD;  Location: [facility] ENDOSCOPY;  Service: Pulmonary;  Laterality: N/A;    Current Outpatient Medications  Medication Sig Dispense Refill   acetaminophen (TYLENOL) 500 MG tablet Take 2 tablets (1,000 mg total) by mouth every 6 (six) hours. 30 tablet 0   diclofenac  Sodium (VOLTAREN) 1 % GEL Apply 2 g topically daily as needed (pain).     docusate sodium (COLACE) 100 MG capsule Take 1 capsule (100 mg total) by mouth 2 (two) times daily as needed for mild constipation. (Patient taking differently: Take 100 mg by mouth 2 (two) times a week.) 10 capsule 0   ferrous sulfate 325 (65 FE) MG tablet Take 325 mg by mouth 2 (two) times a week.     gabapentin (NEURONTIN) 300 MG capsule Take 300 mg by mouth 3 (three) times daily.     glipiZIDE (GLUCOTROL XL) 2.5 MG 24 hr tablet Take 2.5 mg by mouth daily as needed (blood sugar above 160).      Lancets (ONETOUCH DELICA PLUS LANCET30G) MISC 2 (two) times daily.     levothyroxine (SYNTHROID) 75 MCG tablet Take 75 mcg by mouth daily before breakfast.     LOKELMA 5 g packet Take 5 g by mouth every Monday, Wednesday, and Friday.     methocarbamol (ROBAXIN) 500 MG tablet Take 1 tablet (500 mg total) by mouth 2 (two) times daily. 20 tablet 0   Multiple Vitamin (MULTIVITAMIN ADULT PO) Take 1 tablet by mouth daily.     Multiple Vitamins-Minerals (PRESERVISION AREDS PO) Take 1 tablet by mouth in the morning and at bedtime.     omeprazole (PRILOSEC) 20 MG capsule Take 20 mg by mouth daily.     ONETOUCH ULTRA test strip 2 (two) times daily.     oxyCODONE (ROXICODONE) 5 MG immediate release tablet Take 1 tablet (5 mg total) by mouth every 6 (six) hours as needed for severe pain (pain score 7-10). 10 tablet 0   rosuvastatin (CRESTOR) 40 MG tablet Take 40 mg by mouth daily.      telmisartan (MICARDIS) 20 MG tablet Take 10 mg by mouth daily.     VITAMIN D PO Take 2,000 Units by mouth daily.     VITAMIN E PO Take 1 capsule by mouth daily.     carboxymethylcellulose 1 % ophthalmic solution Apply 1 drop to eye 2 (two) times daily.     No current facility-administered medications for this visit.   Allergies:  Patient has no known allergies.   Social History: The patient  reports that he quit smoking about 25 years ago. His smoking use  included cigarettes. He started smoking about 72 years ago. He has a 23.5 pack-year smoking history. He has never used smokeless tobacco. He reports that he does not drink alcohol and does not use drugs.   Family History: The patient's family history includes Cancer in his brother, father, and sister; Diabetes in his brother, brother, brother, and mother; Parkinson's disease in his brother.   ROS:  Please see the history of present illness. Otherwise, complete review of systems is positive for none.  All other systems are reviewed and negative.   Physical Exam: VS:  BP 124/62 (BP Location: Right Arm, Patient Position: Sitting, Cuff Size: Normal)  Pulse 68   Ht 5\' 7"  (1.702 m)   Wt 175 lb (79.4 kg)   BMI 27.41 kg/m , BMI Body mass index is 27.41 kg/m.  Wt Readings from Last 3 Encounters:  01/05/24 175 lb (79.4 kg)  12/09/23 177 lb 0.5 oz (80.3 kg)  11/23/23 175 lb (79.4 kg)    General: Patient appears comfortable at rest. HEENT: Conjunctiva and lids normal, oropharynx clear with moist mucosa. Neck: Supple, no elevated JVP or carotid bruits, no thyromegaly. Lungs: Clear to auscultation, nonlabored breathing at rest. Cardiac: Regular rate and rhythm, no S3 or significant systolic murmur, no pericardial rub. Abdomen: Soft, nontender, no hepatomegaly, bowel sounds present, no guarding or rebound. Extremities: No pitting edema, distal pulses 2+. Skin: Warm and dry. Musculoskeletal: No kyphosis. Neuropsychiatric: Alert and oriented x3, affect grossly appropriate.  Recent Labwork: 12/02/2023: ALT 22; AST 22; BUN 27; Creatinine, Ser 1.91; Hemoglobin 11.6; Platelets 141; Potassium 4.5; Sodium 137  No results found for: "CHOL", "TRIG", "HDL", "CHOLHDL", "VLDL", "LDLCALC", "LDLDIRECT"   Assessment and Plan:  DOE: SOB started after he had lung lobectomy 2021 but noticed it worsening in the last 1 year.  It occurs mainly with over exertional activities like walking his dog for a long time,  taking a long shower or walking excessively.  Compensated on examination.  Not volume overloaded.  Echocardiogram in December 2024 showed normal LVEF, indeterminate diastology, CVP 3 mmHg, normal RV function and no valvular heart disease.  Due to cardiac risk factors of HTN, DM2, he will benefit from exercise Myoview.  If he cannot walk due to knee pain, okay to switch to [employer].  Patient requested he wants to do exercise Myoview as he is preparing to travel to [place] at the end of this month for his grand son's wedding and wants to be prepared for long walks.  Second-degree AV block/Mobitz type I: Occasional dizziness but otherwise no symptoms.  Fatigue in the a.m. but resolves towards the end of the day.  Will obtain 2-week event monitor, live to rule out any intermittent high-grade AV block.  HTN, controlled: Currently on telmisartan 10 mg once daily.  He has CKD stage IIIb.  Will defer this to his PCP or nephrology.  HLD, known values: Continue rosuvastatin 40 mg nightly.  Goal LDL less than 100.  Follows up with PCP.    Medication Adjustments/Labs and Tests Ordered: Current medicines are reviewed at length with the patient today.  Concerns regarding medicines are outlined above.    Disposition:  Follow up pending results  Signed, Lumir Demetriou Verne Spurr, MD, 01/05/2024 2:13 PM    [facility] at [facility] [address] [place], [place] [address]

## 2024-01-07 ENCOUNTER — Telehealth (HOSPITAL_COMMUNITY): Payer: Self-pay | Admitting: Surgery

## 2024-01-07 NOTE — Telephone Encounter (Signed)
 I called the pt remind him of his stress test tomorrow, with his arrival time being 10:00.  I left the pt a VM with the following instructions:  do not eat or drink 6 hrs prior to the test (no caffeine or chocolate), no smoking 8 hrs prior to the test, wear comfortable clothes and tennis shoes, do not wear any lotions/oils, hold your Glipizide the morning of the test, and check in at the front desk upon arrival.  The pt called me back and I went over all these instructions and the pt verbalized understanding.  The pt was also told to follow any other medication instructions given to him by his cardiologist.

## 2024-01-08 ENCOUNTER — Encounter (HOSPITAL_COMMUNITY)
Admission: RE | Admit: 2024-01-08 | Discharge: 2024-01-08 | Disposition: A | Discharge status: Home / Self Care | Source: Ambulatory Visit | Attending: Internal Medicine | Admitting: Internal Medicine

## 2024-01-08 ENCOUNTER — Ambulatory Visit

## 2024-01-08 ENCOUNTER — Encounter (HOSPITAL_COMMUNITY): Payer: Self-pay

## 2024-01-08 ENCOUNTER — Other Ambulatory Visit: Payer: Self-pay

## 2024-01-08 ENCOUNTER — Ambulatory Visit (HOSPITAL_COMMUNITY)
Admission: RE | Admit: 2024-01-08 | Discharge: 2024-01-08 | Disposition: A | Discharge status: Home / Self Care | Source: Ambulatory Visit | Attending: Internal Medicine | Admitting: Internal Medicine

## 2024-01-08 ENCOUNTER — Other Ambulatory Visit

## 2024-01-08 DIAGNOSIS — R0602 Shortness of breath: Secondary | ICD-10-CM | POA: Diagnosis not present

## 2024-01-08 DIAGNOSIS — R0609 Other forms of dyspnea: Secondary | ICD-10-CM

## 2024-01-08 DIAGNOSIS — I441 Atrioventricular block, second degree: Secondary | ICD-10-CM

## 2024-01-08 LAB — NM MYOCAR MULTI W/SPECT W/WALL MOTION / EF
Estimated workload: 1
Exercise duration (min): 0 min
Exercise duration (sec): 0 s
LV dias vol: 78 mL (ref 62–150)
LV sys vol: 25 mL
MPHR: 133 {beats}/min
Nuc Stress EF: 68 %
Peak HR: 93 {beats}/min
Percent HR: 69 %
RATE: 0.3
Rest HR: 61 {beats}/min
Rest Nuclear Isotope Dose: 10.4 mCi
SDS: 4
SRS: 3
SSS: 7
ST Depression (mm): 0 mm
Stress Nuclear Isotope Dose: 30.5 mCi
TID: 1

## 2024-01-08 MED ORDER — SODIUM CHLORIDE FLUSH 0.9 % IV SOLN
INTRAVENOUS | Status: AC
Start: 2024-01-08 — End: 2024-01-08
  Administered 2024-01-08: 10 mL via INTRAVENOUS
  Filled 2024-01-08: qty 10

## 2024-01-08 MED ORDER — REGADENOSON 0.4 MG/5ML IV SOLN
INTRAVENOUS | Status: AC
Start: 2024-01-08 — End: 2024-01-08
  Administered 2024-01-08: 0.4 mg via INTRAVENOUS
  Filled 2024-01-08: qty 5

## 2024-01-08 MED ORDER — TECHNETIUM TC 99M TETROFOSMIN IV KIT
10.0000 | PACK | Freq: Once | INTRAVENOUS | Status: AC | PRN
  Administered 2024-01-08: 10.4 via INTRAVENOUS

## 2024-01-08 MED ORDER — TECHNETIUM TC 99M TETROFOSMIN IV KIT
30.0000 | PACK | Freq: Once | INTRAVENOUS | Status: AC | PRN
  Administered 2024-01-08: 30.5 via INTRAVENOUS

## 2024-01-09 DIAGNOSIS — R0609 Other forms of dyspnea: Secondary | ICD-10-CM | POA: Diagnosis not present

## 2024-01-09 DIAGNOSIS — R0602 Shortness of breath: Secondary | ICD-10-CM | POA: Diagnosis not present

## 2024-01-19 DIAGNOSIS — R809 Proteinuria, unspecified: Secondary | ICD-10-CM | POA: Diagnosis not present

## 2024-01-19 DIAGNOSIS — D638 Anemia in other chronic diseases classified elsewhere: Secondary | ICD-10-CM | POA: Diagnosis not present

## 2024-01-19 DIAGNOSIS — N1832 Chronic kidney disease, stage 3b: Secondary | ICD-10-CM | POA: Diagnosis not present

## 2024-01-19 DIAGNOSIS — E1129 Type 2 diabetes mellitus with other diabetic kidney complication: Secondary | ICD-10-CM | POA: Diagnosis not present

## 2024-02-07 ENCOUNTER — Ambulatory Visit

## 2024-02-18 DIAGNOSIS — H353211 Exudative age-related macular degeneration, right eye, with active choroidal neovascularization: Secondary | ICD-10-CM | POA: Diagnosis not present

## 2024-02-19 DIAGNOSIS — R0609 Other forms of dyspnea: Secondary | ICD-10-CM | POA: Diagnosis not present

## 2024-02-19 DIAGNOSIS — R0602 Shortness of breath: Secondary | ICD-10-CM

## 2024-02-19 DIAGNOSIS — I441 Atrioventricular block, second degree: Secondary | ICD-10-CM | POA: Diagnosis not present

## 2024-02-20 DIAGNOSIS — R6883 Chills (without fever): Secondary | ICD-10-CM | POA: Diagnosis not present

## 2024-02-20 DIAGNOSIS — R059 Cough, unspecified: Secondary | ICD-10-CM | POA: Diagnosis not present

## 2024-02-20 DIAGNOSIS — R0981 Nasal congestion: Secondary | ICD-10-CM | POA: Diagnosis not present

## 2024-02-25 DIAGNOSIS — R002 Palpitations: Secondary | ICD-10-CM | POA: Diagnosis not present

## 2024-02-25 DIAGNOSIS — E1122 Type 2 diabetes mellitus with diabetic chronic kidney disease: Secondary | ICD-10-CM | POA: Diagnosis not present

## 2024-02-25 DIAGNOSIS — E559 Vitamin D deficiency, unspecified: Secondary | ICD-10-CM | POA: Diagnosis not present

## 2024-02-25 DIAGNOSIS — R0600 Dyspnea, unspecified: Secondary | ICD-10-CM | POA: Diagnosis not present

## 2024-03-01 DIAGNOSIS — E1122 Type 2 diabetes mellitus with diabetic chronic kidney disease: Secondary | ICD-10-CM | POA: Diagnosis not present

## 2024-03-01 DIAGNOSIS — R519 Headache, unspecified: Secondary | ICD-10-CM | POA: Diagnosis not present

## 2024-03-01 DIAGNOSIS — I441 Atrioventricular block, second degree: Secondary | ICD-10-CM | POA: Diagnosis not present

## 2024-03-01 DIAGNOSIS — M109 Gout, unspecified: Secondary | ICD-10-CM | POA: Diagnosis not present

## 2024-03-01 DIAGNOSIS — R002 Palpitations: Secondary | ICD-10-CM | POA: Diagnosis not present

## 2024-03-01 DIAGNOSIS — E559 Vitamin D deficiency, unspecified: Secondary | ICD-10-CM | POA: Diagnosis not present

## 2024-03-01 DIAGNOSIS — I129 Hypertensive chronic kidney disease with stage 1 through stage 4 chronic kidney disease, or unspecified chronic kidney disease: Secondary | ICD-10-CM | POA: Diagnosis not present

## 2024-03-01 DIAGNOSIS — K219 Gastro-esophageal reflux disease without esophagitis: Secondary | ICD-10-CM | POA: Diagnosis not present

## 2024-03-01 DIAGNOSIS — N1832 Chronic kidney disease, stage 3b: Secondary | ICD-10-CM | POA: Diagnosis not present

## 2024-03-01 DIAGNOSIS — D472 Monoclonal gammopathy: Secondary | ICD-10-CM | POA: Diagnosis not present

## 2024-03-01 DIAGNOSIS — R0600 Dyspnea, unspecified: Secondary | ICD-10-CM | POA: Diagnosis not present

## 2024-03-02 ENCOUNTER — Other Ambulatory Visit (HOSPITAL_COMMUNITY): Payer: Self-pay | Admitting: Family Medicine

## 2024-03-02 DIAGNOSIS — R0609 Other forms of dyspnea: Secondary | ICD-10-CM

## 2024-03-03 ENCOUNTER — Ambulatory Visit (HOSPITAL_COMMUNITY)
Admission: RE | Admit: 2024-03-03 | Discharge: 2024-03-03 | Disposition: A | Discharge status: Home / Self Care | Source: Ambulatory Visit | Attending: Family Medicine | Admitting: Family Medicine

## 2024-03-03 DIAGNOSIS — R0609 Other forms of dyspnea: Secondary | ICD-10-CM | POA: Insufficient documentation

## 2024-03-03 DIAGNOSIS — I2699 Other pulmonary embolism without acute cor pulmonale: Secondary | ICD-10-CM | POA: Diagnosis not present

## 2024-03-03 DIAGNOSIS — J941 Fibrothorax: Secondary | ICD-10-CM | POA: Diagnosis not present

## 2024-03-03 DIAGNOSIS — J432 Centrilobular emphysema: Secondary | ICD-10-CM | POA: Diagnosis not present

## 2024-03-03 DIAGNOSIS — C3412 Malignant neoplasm of upper lobe, left bronchus or lung: Secondary | ICD-10-CM | POA: Diagnosis not present

## 2024-03-03 MED ORDER — IOHEXOL 350 MG/ML SOLN
75.0000 mL | Freq: Once | INTRAVENOUS | Status: AC | PRN
  Administered 2024-03-03: 75 mL via INTRAVENOUS

## 2024-03-04 LAB — POCT I-STAT CREATININE: Creatinine, Ser: 2 mg/dL — ABNORMAL HIGH (ref 0.61–1.24)

## 2024-03-11 ENCOUNTER — Ambulatory Visit: Payer: Self-pay | Admitting: Internal Medicine

## 2024-03-16 NOTE — Telephone Encounter (Signed)
 Pt is returning call.

## 2024-04-07 ENCOUNTER — Telehealth: Payer: Self-pay | Admitting: Internal Medicine

## 2024-04-07 NOTE — Telephone Encounter (Signed)
 Rescheduled appointments per the patients request.

## 2024-04-13 ENCOUNTER — Ambulatory Visit: Admitting: Internal Medicine

## 2024-04-13 ENCOUNTER — Other Ambulatory Visit

## 2024-04-13 DIAGNOSIS — H43813 Vitreous degeneration, bilateral: Secondary | ICD-10-CM | POA: Diagnosis not present

## 2024-04-13 DIAGNOSIS — H04123 Dry eye syndrome of bilateral lacrimal glands: Secondary | ICD-10-CM | POA: Diagnosis not present

## 2024-04-13 DIAGNOSIS — H3581 Retinal edema: Secondary | ICD-10-CM | POA: Diagnosis not present

## 2024-04-13 DIAGNOSIS — H353122 Nonexudative age-related macular degeneration, left eye, intermediate dry stage: Secondary | ICD-10-CM | POA: Diagnosis not present

## 2024-04-13 DIAGNOSIS — H353211 Exudative age-related macular degeneration, right eye, with active choroidal neovascularization: Secondary | ICD-10-CM | POA: Diagnosis not present

## 2024-04-13 DIAGNOSIS — H35033 Hypertensive retinopathy, bilateral: Secondary | ICD-10-CM | POA: Diagnosis not present

## 2024-04-18 ENCOUNTER — Other Ambulatory Visit: Payer: Self-pay | Admitting: Medical Oncology

## 2024-04-18 DIAGNOSIS — D472 Monoclonal gammopathy: Secondary | ICD-10-CM

## 2024-04-18 NOTE — Progress Notes (Signed)
 Lab orders entered

## 2024-04-19 ENCOUNTER — Inpatient Hospital Stay: Attending: Internal Medicine

## 2024-04-19 ENCOUNTER — Inpatient Hospital Stay: Admitting: Internal Medicine

## 2024-04-19 VITALS — BP 136/78 | HR 74 | Temp 97.5°F | Resp 16 | Ht 67.0 in | Wt 178.1 lb

## 2024-04-19 DIAGNOSIS — D472 Monoclonal gammopathy: Secondary | ICD-10-CM | POA: Diagnosis not present

## 2024-04-19 DIAGNOSIS — K219 Gastro-esophageal reflux disease without esophagitis: Secondary | ICD-10-CM | POA: Insufficient documentation

## 2024-04-19 DIAGNOSIS — N189 Chronic kidney disease, unspecified: Secondary | ICD-10-CM | POA: Insufficient documentation

## 2024-04-19 DIAGNOSIS — Z902 Acquired absence of lung [part of]: Secondary | ICD-10-CM | POA: Insufficient documentation

## 2024-04-19 DIAGNOSIS — E1122 Type 2 diabetes mellitus with diabetic chronic kidney disease: Secondary | ICD-10-CM | POA: Insufficient documentation

## 2024-04-19 DIAGNOSIS — Z7984 Long term (current) use of oral hypoglycemic drugs: Secondary | ICD-10-CM | POA: Diagnosis not present

## 2024-04-19 DIAGNOSIS — I129 Hypertensive chronic kidney disease with stage 1 through stage 4 chronic kidney disease, or unspecified chronic kidney disease: Secondary | ICD-10-CM | POA: Diagnosis not present

## 2024-04-19 DIAGNOSIS — M1990 Unspecified osteoarthritis, unspecified site: Secondary | ICD-10-CM | POA: Insufficient documentation

## 2024-04-19 DIAGNOSIS — E039 Hypothyroidism, unspecified: Secondary | ICD-10-CM | POA: Diagnosis not present

## 2024-04-19 DIAGNOSIS — Z85118 Personal history of other malignant neoplasm of bronchus and lung: Secondary | ICD-10-CM | POA: Diagnosis not present

## 2024-04-19 DIAGNOSIS — E7800 Pure hypercholesterolemia, unspecified: Secondary | ICD-10-CM | POA: Diagnosis not present

## 2024-04-19 DIAGNOSIS — C3490 Malignant neoplasm of unspecified part of unspecified bronchus or lung: Secondary | ICD-10-CM

## 2024-04-19 DIAGNOSIS — Z87891 Personal history of nicotine dependence: Secondary | ICD-10-CM | POA: Insufficient documentation

## 2024-04-19 DIAGNOSIS — Z79899 Other long term (current) drug therapy: Secondary | ICD-10-CM | POA: Diagnosis not present

## 2024-04-19 LAB — CMP (CANCER CENTER ONLY)
ALT: 26 U/L (ref 0–44)
AST: 19 U/L (ref 15–41)
Albumin: 4.1 g/dL (ref 3.5–5.0)
Alkaline Phosphatase: 40 U/L (ref 38–126)
Anion gap: 7 (ref 5–15)
BUN: 34 mg/dL — ABNORMAL HIGH (ref 8–23)
CO2: 26 mmol/L (ref 22–32)
Calcium: 9.8 mg/dL (ref 8.9–10.3)
Chloride: 107 mmol/L (ref 98–111)
Creatinine: 1.95 mg/dL — ABNORMAL HIGH (ref 0.61–1.24)
GFR, Estimated: 33 mL/min — ABNORMAL LOW (ref >60)
Glucose, Bld: 108 mg/dL — ABNORMAL HIGH (ref 70–99)
Potassium: 5 mmol/L (ref 3.5–5.1)
Sodium: 140 mmol/L (ref 135–145)
Total Bilirubin: 0.3 mg/dL (ref 0.0–1.2)
Total Protein: 7.7 g/dL (ref 6.5–8.1)

## 2024-04-19 LAB — CBC WITH DIFFERENTIAL (CANCER CENTER ONLY)
Abs Immature Granulocytes: 0.02 K/uL (ref 0.00–0.07)
Basophils Absolute: 0.1 K/uL (ref 0.0–0.1)
Basophils Relative: 1 %
Eosinophils Absolute: 0.3 K/uL (ref 0.0–0.5)
Eosinophils Relative: 4 %
HCT: 34.7 % — ABNORMAL LOW (ref 39.0–52.0)
Hemoglobin: 11.9 g/dL — ABNORMAL LOW (ref 13.0–17.0)
Immature Granulocytes: 0 %
Lymphocytes Relative: 22 %
Lymphs Abs: 1.5 K/uL (ref 0.7–4.0)
MCH: 30.2 pg (ref 26.0–34.0)
MCHC: 34.3 g/dL (ref 30.0–36.0)
MCV: 88.1 fL (ref 80.0–100.0)
Monocytes Absolute: 0.6 K/uL (ref 0.1–1.0)
Monocytes Relative: 9 %
Neutro Abs: 4.6 K/uL (ref 1.7–7.7)
Neutrophils Relative %: 64 %
Platelet Count: 154 K/uL (ref 150–400)
RBC: 3.94 MIL/uL — ABNORMAL LOW (ref 4.22–5.81)
RDW: 15.4 % (ref 11.5–15.5)
WBC Count: 7 K/uL (ref 4.0–10.5)
nRBC: 0 % (ref 0.0–0.2)

## 2024-04-19 NOTE — Progress Notes (Signed)
 [facility] [phone]   [phone]  CONSULT NOTE  REFERRING PHYSICIAN: Dr. Rogers  REASON FOR CONSULTATION:  The patient with history of lung cancer and MGUS to establish care  HPI William Wells is a 89 y.o. male.  Discussed the use of AI scribe software for clinical note transcription with the patient, who gave verbal consent to proceed.  History of Present Illness William Wells is an 89 year old male with stage 1A non-small cell lung cancer who presents for follow-up after his previous oncologist relocated. He is accompanied by his daughter, Verneita. He was previously under the care of Dr. Perfecto, who has relocated, prompting this follow-up visit.  In August 2021, he was diagnosed with a nodule in the left upper lobe of his lung, which was biopsied and identified as adenocarcinoma. He underwent a left upper lobectomy, confirming a 3.0 cm stage 1A non-small cell lung cancer, T1c. Post-surgery, he was monitored with regular scans every six months, transitioning to annual scans, as no further treatment was deemed necessary due to the early stage of the cancer.  He has a diagnosis of MGUS, monitored with regular protein studies. He was referred to a kidney specialist due to abnormal protein levels, leading to extensive blood tests.  Recently, he underwent a CT angiogram of the chest on Mar 03, 2024. He experiences occasional palpitations, described as his heart 'jumping'. No chest pain, hemoptysis, nausea, vomiting, diarrhea, or weight loss. He mentions gaining weight back to 178 pounds after a previous loss.  He has a history of high blood pressure, diabetes, arthritis, high cholesterol, hypothyroidism, chronic kidney disease, and acid reflux. He uses a cane for mobility due to decreased strength and balance and wants to regain his previous strength levels. He quit smoking 25 years ago after smoking for approximately 34 years and does not consume  alcohol or use illicit drugs.    HPI  Past Medical History:  Diagnosis Date   Acid reflux disease    Arthritis    Complication of anesthesia    appendix surgery (89 year old) - passed out once patient got back to floor unit   Diabetes mellitus    Dyspnea    Family history of adverse reaction to anesthesia    daughter states that she has a difficult time waking up   Hiatal hernia    High cholesterol    Hypertension    Hypothyroidism    Lung cancer (HCC)    s/p XI robotic assisted thoracoscopy for LU lobectomy 06/22/20   Monoclonal gammopathy of unknown significance (MGUS) 03/28/2020   Normocytic anemia 03/28/2020   Renal disorder    renal insufficiency   Type 2 diabetes mellitus (HCC)       Past Surgical History:  Procedure Laterality Date   APPENDECTOMY     ARTERY BIOPSY Right 11/23/2023   Procedure: RIGHT BIOPSY TEMPORAL ARTERY;  Surgeon: Gretta Lonni PARAS, MD;  Location: [facility] OR;  Service: Vascular;  Laterality: Right;   BRONCHIAL BIOPSY  05/29/2020   Procedure: BRONCHIAL BIOPSIES;  Surgeon: Shelah Lamar RAMAN, MD;  Location: [facility] ENDOSCOPY;  Service: Pulmonary;;   BRONCHIAL BRUSHINGS  05/29/2020   Procedure: BRONCHIAL BRUSHINGS;  Surgeon: Shelah Lamar RAMAN, MD;  Location: [facility] ENDOSCOPY;  Service: Pulmonary;;   BRONCHIAL NEEDLE ASPIRATION BIOPSY  05/29/2020   Procedure: BRONCHIAL NEEDLE ASPIRATION BIOPSIES;  Surgeon: Shelah Lamar RAMAN, MD;  Location: [facility] ENDOSCOPY;  Service: Pulmonary;;   BRONCHIAL WASHINGS  05/29/2020   Procedure:  BRONCHIAL WASHINGS;  Surgeon: Shelah Lamar RAMAN, MD;  Location: [facility] ENDOSCOPY;  Service: Pulmonary;;   CATARACT EXTRACTION W/PHACO Left 01/23/2023   Procedure: CATARACT EXTRACTION PHACO AND INTRAOCULAR LENS PLACEMENT (IOC);  Surgeon: Harrie Agent, MD;  Location: [facility] ORS;  Service: Ophthalmology;  Laterality: Left;  CDE: 13.69   CATARACT EXTRACTION W/PHACO Right 02/20/2023   Procedure: CATARACT EXTRACTION PHACO AND INTRAOCULAR LENS PLACEMENT (IOC);   Surgeon: Harrie Agent, MD;  Location: [facility] ORS;  Service: Ophthalmology;  Laterality: Right;  CDE: 12.88   CERVICAL SPINE SURGERY     EYE SURGERY     strabismus   INTERCOSTAL NERVE BLOCK Left 06/22/2020   Procedure: INTERCOSTAL NERVE BLOCK;  Surgeon: Kerrin Elspeth BROCKS, MD;  Location: [facility] OR;  Service: Thoracic;  Laterality: Left;   NODE DISSECTION Left 06/22/2020   Procedure: NODE DISSECTION;  Surgeon: Kerrin Elspeth BROCKS, MD;  Location: [facility] OR;  Service: Thoracic;  Laterality: Left;   TONSILLECTOMY     VIDEO BRONCHOSCOPY WITH ENDOBRONCHIAL NAVIGATION N/A 05/29/2020   Procedure: VIDEO BRONCHOSCOPY WITH ENDOBRONCHIAL NAVIGATION;  Surgeon: Shelah Lamar RAMAN, MD;  Location: [facility] ENDOSCOPY;  Service: Pulmonary;  Laterality: N/A;    Family History  Problem Relation Age of Onset   Diabetes Mother    Cancer Father    Cancer Sister    Diabetes Brother    Cancer Brother    Diabetes Brother    Parkinson's disease Brother    Diabetes Brother     Social History Social History   Tobacco Use   Smoking status: Former    Current packs/day: 0.00    Average packs/day: 0.5 packs/day for 47.0 years (23.5 ttl pk-yrs)    Types: Cigarettes    Start date: 45    Quit date: 2000    Years since quitting: 25.5   Smokeless tobacco: Never  Vaping Use   Vaping status: Never Used  Substance Use Topics   Alcohol use: No   Drug use: No    No Known Allergies  Current Outpatient Medications  Medication Sig Dispense Refill   acetaminophen  (TYLENOL ) 500 MG tablet Take 2 tablets (1,000 mg total) by mouth every 6 (six) hours. 30 tablet 0   albuterol  (VENTOLIN  HFA) 108 (90 Base) MCG/ACT inhaler INAHLE 2 PUFFS BY MOUTH 4 TIMES A DAY AS NEEDED FOR COUGH/WHEEZING     diclofenac Sodium (VOLTAREN) 1 % GEL Apply 2 g topically daily as needed (pain).     docusate sodium  (COLACE) 100 MG capsule Take 1 capsule (100 mg total) by mouth 2 (two) times daily as needed for mild constipation. (Patient taking  differently: Take 100 mg by mouth 2 (two) times a week.) 10 capsule 0   ferrous sulfate 325 (65 FE) MG tablet Take 325 mg by mouth 2 (two) times a week.     gabapentin  (NEURONTIN ) 300 MG capsule Take 300 mg by mouth 3 (three) times daily.     glipiZIDE  (GLUCOTROL  XL) 2.5 MG 24 hr tablet Take 2.5 mg by mouth daily as needed (blood sugar above 160).      Lancets (ONETOUCH DELICA PLUS LANCET30G) MISC 2 (two) times daily.     levothyroxine  (SYNTHROID ) 75 MCG tablet Take 75 mcg by mouth daily before breakfast.     LOKELMA 5 g packet Take 5 g by mouth every Monday, Wednesday, and Friday.     loratadine (CLARITIN) 10 MG tablet Take 1 tablet by mouth daily.     methocarbamol  (ROBAXIN ) 500 MG tablet Take 1 tablet (500 mg total)  by mouth 2 (two) times daily. 20 tablet 0   Multiple Vitamin (MULTIVITAMIN ADULT PO) Take 1 tablet by mouth daily.     Multiple Vitamins-Minerals (PRESERVISION AREDS PO) Take 1 tablet by mouth in the morning and at bedtime.     omeprazole (PRILOSEC) 20 MG capsule Take 20 mg by mouth daily.     ONETOUCH ULTRA test strip 2 (two) times daily.     oxyCODONE  (ROXICODONE ) 5 MG immediate release tablet Take 1 tablet (5 mg total) by mouth every 6 (six) hours as needed for severe pain (pain score 7-10). 10 tablet 0   rosuvastatin  (CRESTOR ) 40 MG tablet Take 40 mg by mouth daily.      telmisartan (MICARDIS) 20 MG tablet Take 10 mg by mouth daily.     VITAMIN D  PO Take 2,000 Units by mouth daily.     VITAMIN E PO Take 1 capsule by mouth daily.     No current facility-administered medications for this visit.    Review of Systems  Constitutional: positive for fatigue Eyes: negative Ears, nose, mouth, throat, and face: negative Respiratory: negative Cardiovascular: negative Gastrointestinal: negative Genitourinary:negative Integument/breast: negative Hematologic/lymphatic: negative Musculoskeletal:positive for arthralgias and muscle weakness Neurological:  negative Behavioral/Psych: negative Endocrine: negative Allergic/Immunologic: negative  Physical Exam  MJO:jozmu, healthy, no distress, well nourished, and well developed SKIN: skin color, texture, turgor are normal, no rashes or significant lesions HEAD: Normocephalic, No masses, lesions, tenderness or abnormalities EYES: normal, PERRLA, Conjunctiva are pink and non-injected EARS: External ears normal, Canals clear OROPHARYNX:no exudate, no erythema, and lips, buccal mucosa, and tongue normal  NECK: supple, no adenopathy, no JVD LYMPH:  no palpable lymphadenopathy, no hepatosplenomegaly LUNGS: clear to auscultation , and palpation HEART: regular rate & rhythm, no murmurs, and no gallops ABDOMEN:abdomen soft, non-tender, normal bowel sounds, and no masses or organomegaly BACK: Back symmetric, no curvature., No CVA tenderness EXTREMITIES:no joint deformities, effusion, or inflammation, no edema  NEURO: alert & oriented x 3 with fluent speech, no focal motor/sensory deficits  PERFORMANCE STATUS: ECOG 1  LABORATORY DATA: Lab Results  Component Value Date   WBC 7.0 04/19/2024   HGB 11.9 (L) 04/19/2024   HCT 34.7 (L) 04/19/2024   MCV 88.1 04/19/2024   PLT 154 04/19/2024      Chemistry      Component Value Date/Time   NA 140 04/19/2024 1330   NA 139 10/19/2018 0000   K 5.0 04/19/2024 1330   CL 107 04/19/2024 1330   CO2 26 04/19/2024 1330   BUN 34 (H) 04/19/2024 1330   BUN 25 (A) 04/15/2019 0000   CREATININE 1.95 (H) 04/19/2024 1330   GLU 120 04/15/2019 0000      Component Value Date/Time   CALCIUM  9.8 04/19/2024 1330   ALKPHOS 40 04/19/2024 1330   AST 19 04/19/2024 1330   ALT 26 04/19/2024 1330   BILITOT 0.3 04/19/2024 1330       RADIOGRAPHIC STUDIES: No results found.  ASSESSMENT AND PLAN: Assessment and Plan Assessment & Plan Non-small cell lung cancer, stage 1A Stage 1A non-small cell lung cancer, T1c, status post left upper lobectomy in 2021. No evidence  of recurrence on recent CT angiogram of the chest performed on Mar 03, 2024. - Cancel the CT scan scheduled for June 07, 2024. - Schedule follow-up in one year with a CT scan one week prior to the appointment.  Monoclonal gammopathy of undetermined significance (MGUS) MGUS with previous monitoring by Dr. Perfecto. No recent changes in protein levels. -  Continue monitoring protein levels every 6 months to a year, depending on clinical status.  Chronic kidney disease, unspecified Chronic kidney disease, unspecified.  Hypertension Hypertension.  Hypothyroidism, unspecified Hypothyroidism.  Gastroesophageal reflux disease (GERD) GERD. The patient was advised to call immediately if he has any other concerning symptoms in the interval.  The patient voices understanding of current disease status and treatment options and is in agreement with the current care plan.  All questions were answered. The patient knows to call the clinic with any problems, questions or concerns. We can certainly see the patient much sooner if necessary.  Thank you so much for allowing me to participate in the care of William Wells. I will continue to follow up the patient with you and assist in his care. The total time spent in the appointment was 60 minutes including review of chart and various tests results, discussions about plan of care and coordination of care plan .   Disclaimer: This note was dictated with voice recognition software. Similar sounding words can inadvertently be transcribed and may not be corrected upon review.   Sherrod MARLA Sherrod April 19, 2024, 3:07 PM

## 2024-05-20 ENCOUNTER — Encounter (HOSPITAL_COMMUNITY): Payer: Self-pay

## 2024-05-20 ENCOUNTER — Ambulatory Visit (HOSPITAL_COMMUNITY)
Admission: EM | Admit: 2024-05-20 | Discharge: 2024-05-20 | Disposition: A | Discharge status: Home / Self Care | Attending: Emergency Medicine | Admitting: Emergency Medicine

## 2024-05-20 ENCOUNTER — Ambulatory Visit (INDEPENDENT_AMBULATORY_CARE_PROVIDER_SITE_OTHER)

## 2024-05-20 DIAGNOSIS — M79662 Pain in left lower leg: Secondary | ICD-10-CM

## 2024-05-20 DIAGNOSIS — M545 Low back pain, unspecified: Secondary | ICD-10-CM | POA: Diagnosis not present

## 2024-05-20 DIAGNOSIS — M1712 Unilateral primary osteoarthritis, left knee: Secondary | ICD-10-CM | POA: Diagnosis not present

## 2024-05-20 DIAGNOSIS — M79605 Pain in left leg: Secondary | ICD-10-CM | POA: Diagnosis not present

## 2024-05-20 MED ORDER — PREDNISONE 20 MG PO TABS: 40.0000 mg | ORAL_TABLET | Freq: Every day | ORAL | 0 refills | Status: AC

## 2024-05-20 NOTE — Discharge Instructions (Signed)
 Your x-ray is negative for any underlying fracture or dislocation. Start taking prednisone 2 tablets once daily for 3 days to help reduce inflammation related to your pain. You can continue taking hydrocodone and Tylenol as needed for pain. Rest, ice, and elevate your leg often throughout the day. You can follow-up with [facility] for further evaluation of your pain. Otherwise she can follow-up with your primary care provider or return here as needed.

## 2024-05-20 NOTE — ED Triage Notes (Signed)
 Patient presenting with left leg pain onset 1 week ago with worsening pain. States did a lot of walking in [place] . Chronic leg pain has gotten worse, now unable to bear much weight. No recent falls or injures.  Prescriptions or OTC medications tried: Yes- Hydrocodone, Tylenol     with mild relief

## 2024-05-20 NOTE — ED Provider Notes (Signed)
 [facility]    CSN: [id number] Arrival date & time: 05/20/24  9173      History   Chief Complaint Chief Complaint  Patient presents with   Leg Pain    HPI William Wells is a 89 y.o. male.   Patient presents with left lower leg pain that has been bothering him for about a week.  Patient reports that he does have chronic pain in this leg, but states that it is much worse now and he is having difficulty bearing weight on it due to pain.  Patient denies any known injury or falls.  Patient states that he was doing a lot of walking while visiting [place]  and the pain began after this.  Patient states that he has been taking hydrocodone and Tylenol with minimal relief.  Past medical history includes adenocarcinoma of the left lung, arthritis, hyperlipidemia, CKD, hypertension, type 2 diabetes, and hypothyroidism.  The history is provided by the patient and medical records.  Leg Pain   Past Medical History:  Diagnosis Date   Acid reflux disease    Arthritis    Complication of anesthesia    appendix surgery (89 year old) - passed out once patient got back to floor unit   Diabetes mellitus    Dyspnea    Family history of adverse reaction to anesthesia    daughter states that she has a difficult time waking up   Hiatal hernia    High cholesterol    Hypertension    Hypothyroidism    Lung cancer (HCC)    s/p XI robotic assisted thoracoscopy for LU lobectomy 06/22/20   Monoclonal gammopathy of unknown significance (MGUS) 03/28/2020   Normocytic anemia 03/28/2020   Renal disorder    renal insufficiency   Type 2 diabetes mellitus (HCC)     Patient Active Problem List   Diagnosis Date Noted   DOE (dyspnea on exertion) 01/05/2024   HLD (hyperlipidemia) 01/05/2024   Second degree AV block, Mobitz type I 01/05/2024   Headache 11/10/2023   Adenocarcinoma of left lung, stage 1 (HCC) 06/26/2020   Status post robot-assisted surgical procedure 06/22/2020   S/P  robot-assisted surgical procedure 06/22/2020   Mass of upper lobe of left lung 04/11/2020   Monoclonal gammopathy of unknown significance (MGUS) 03/28/2020   Normocytic anemia 03/28/2020   Hyperkalemia 07/15/2019   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) 07/15/2019   Benign hypertension with chronic kidney disease 07/15/2019   Sciatica, left side 02/17/2018    Past Surgical History:  Procedure Laterality Date   APPENDECTOMY     ARTERY BIOPSY Right 11/23/2023   Procedure: RIGHT BIOPSY TEMPORAL ARTERY;  Surgeon: Gretta Lonni PARAS, MD;  Location: [facility] OR;  Service: Vascular;  Laterality: Right;   BRONCHIAL BIOPSY  05/29/2020   Procedure: BRONCHIAL BIOPSIES;  Surgeon: Shelah Lamar RAMAN, MD;  Location: [facility];  Service: Pulmonary;;   BRONCHIAL BRUSHINGS  05/29/2020   Procedure: BRONCHIAL BRUSHINGS;  Surgeon: Shelah Lamar RAMAN, MD;  Location: [facility] ENDOSCOPY;  Service: Pulmonary;;   BRONCHIAL NEEDLE ASPIRATION BIOPSY  05/29/2020   Procedure: BRONCHIAL NEEDLE ASPIRATION BIOPSIES;  Surgeon: Shelah Lamar RAMAN, MD;  Location: [facility];  Service: Pulmonary;;   BRONCHIAL WASHINGS  05/29/2020   Procedure: BRONCHIAL WASHINGS;  Surgeon: Shelah Lamar RAMAN, MD;  Location: [facility] ENDOSCOPY;  Service: Pulmonary;;   CATARACT EXTRACTION W/PHACO Left 01/23/2023   Procedure: CATARACT EXTRACTION PHACO AND INTRAOCULAR LENS PLACEMENT (IOC);  Surgeon: Harrie Lynwood, MD;  Location:  [facility] ORS;  Service: Ophthalmology;  Laterality: Left;  CDE: 13.69   CATARACT EXTRACTION W/PHACO Right 02/20/2023   Procedure: CATARACT EXTRACTION PHACO AND INTRAOCULAR LENS PLACEMENT (IOC);  Surgeon: Harrie Agent, MD;  Location: [facility]S;  Service: Ophthalmology;  Laterality: Right;  CDE: 12.88   CERVICAL SPINE SURGERY     EYE SURGERY     strabismus   INTERCOSTAL NERVE BLOCK Left 06/22/2020   Procedure: INTERCOSTAL NERVE BLOCK;  Surgeon: Kerrin Elspeth BROCKS, MD;  Location: [facility] OR;  Service: Thoracic;  Laterality: Left;    NODE DISSECTION Left 06/22/2020   Procedure: NODE DISSECTION;  Surgeon: Kerrin Elspeth BROCKS, MD;  Location: [facility] OR;  Service: Thoracic;  Laterality: Left;   TONSILLECTOMY     VIDEO BRONCHOSCOPY WITH ENDOBRONCHIAL NAVIGATION N/A 05/29/2020   Procedure: VIDEO BRONCHOSCOPY WITH ENDOBRONCHIAL NAVIGATION;  Surgeon: Shelah Lamar RAMAN, MD;  Location: [facility];  Service: Pulmonary;  Laterality: N/A;       Home Medications    Prior to Admission medications   Medication Sig Start Date End Date Taking? Authorizing Provider  acetaminophen (TYLENOL) 500 MG tablet Take 2 tablets (1,000 mg total) by mouth every 6 (six) hours. 06/26/20  Yes Conte, Tessa N, PA-C  albuterol (VENTOLIN HFA) 108 (90 Base) MCG/ACT inhaler INAHLE 2 PUFFS BY MOUTH 4 TIMES A DAY AS NEEDED FOR COUGH/WHEEZING   Yes [provider]  diclofenac Sodium (VOLTAREN) 1 % GEL Apply 2 g topically daily as needed (pain).   Yes [provider]  docusate sodium (COLACE) 100 MG capsule Take 1 capsule (100 mg total) by mouth 2 (two) times daily as needed for mild constipation. Patient taking differently: Take 100 mg by mouth 2 (two) times a week. 06/26/20  Yes Conte, Tessa N, PA-C  ferrous sulfate 325 (65 FE) MG tablet Take 325 mg by mouth 2 (two) times a week.   Yes [provider]  gabapentin (NEURONTIN) 300 MG capsule Take 300 mg by mouth 3 (three) times daily. 12/13/21  Yes [provider]  glipiZIDE (GLUCOTROL XL) 2.5 MG 24 hr tablet Take 2.5 mg by mouth daily as needed (blood sugar above 160).  03/20/20  Yes [provider]  Lancets (ONETOUCH DELICA PLUS LANCET30G) MISC 2 (two) times daily. 03/27/20  Yes [provider]  levothyroxine (SYNTHROID) 75 MCG tablet Take 75 mcg by mouth daily before breakfast. 05/23/21  Yes [provider]  LOKELMA 5 g packet Take 5 g by mouth every Monday, Wednesday, and Friday. 10/08/20  Yes [provider]  loratadine (CLARITIN) 10 MG tablet  Take 1 tablet by mouth daily.   Yes [provider]  Multiple Vitamin (MULTIVITAMIN ADULT PO) Take 1 tablet by mouth daily.   Yes [provider]  Multiple Vitamins-Minerals (PRESERVISION AREDS PO) Take 1 tablet by mouth in the morning and at bedtime.   Yes [provider]  omeprazole (PRILOSEC) 20 MG capsule Take 20 mg by mouth daily.   Yes [provider]  [facility] ULTRA test strip 2 (two) times daily. 04/06/20  Yes [provider]  oxyCODONE (ROXICODONE) 5 MG immediate release tablet Take 1 tablet (5 mg total) by mouth every 6 (six) hours as needed for severe pain (pain score 7-10). 11/23/23  Yes Baglia, Corrina, PA-C  predniSONE (DELTASONE) 20 MG tablet Take 2 tablets (40 mg total) by mouth daily for 3 days. 05/20/24 05/23/24 Yes Ani Deoliveira A, NP  rosuvastatin (CRESTOR) 40 MG tablet Take 40 mg by mouth daily.  Yes [provider]  telmisartan (MICARDIS) 20 MG tablet Take 10 mg by mouth daily. 04/08/23  Yes [provider]  VITAMIN D  PO Take 2,000 Units by mouth daily.   Yes [provider]  VITAMIN E PO Take 1 capsule by mouth daily.   Yes [provider]    Family History Family History  Problem Relation Age of Onset   Diabetes Mother    Cancer Father    Cancer Sister    Diabetes Brother    Cancer Brother    Diabetes Brother    Parkinson's disease Brother    Diabetes Brother     Social History Social History   Tobacco Use   Smoking status: Former    Current packs/day: 0.00    Average packs/day: 0.5 packs/day for 47.0 years (23.5 ttl pk-yrs)    Types: Cigarettes    Start date: 33    Quit date: 2000    Years since quitting: 25.6   Smokeless tobacco: Never  Vaping Use   Vaping status: Never Used  Substance Use Topics   Alcohol use: No   Drug use: No     Allergies   Patient has no known allergies.   Review of Systems Review of Systems  Per HPI  Physical Exam Triage Vital  Signs ED Triage Vitals  Encounter Vitals Group     BP 05/20/24 0952 116/73     Girls Systolic BP Percentile --      Girls Diastolic BP Percentile --      Boys Systolic BP Percentile --      Boys Diastolic BP Percentile --      Pulse Rate 05/20/24 0952 60     Resp 05/20/24 0952 18     Temp 05/20/24 0952 97.7 F (36.5 C)     Temp Source 05/20/24 0952 Oral     SpO2 05/20/24 0952 97 %     Weight 05/20/24 0951 178 lb 9.6 oz (81 kg)     Height 05/20/24 0951 5' 7 (1.702 m)     Head Circumference --      Peak Flow --      Pain Score 05/20/24 0950 3     Pain Loc --      Pain Education --      Exclude from Growth Chart --    No data found.  Updated Vital Signs BP 116/73 (BP Location: Left Arm)   Pulse 60   Temp 97.7 F (36.5 C) (Oral)   Resp 18   Ht 5' 7 (1.702 m)   Wt 178 lb 9.6 oz (81 kg)   SpO2 97%   BMI 27.97 kg/m   Visual Acuity Right Eye Distance:   Left Eye Distance:   Bilateral Distance:    Right Eye Near:   Left Eye Near:    Bilateral Near:     Physical Exam Vitals and nursing note reviewed.  Constitutional:      General: He is awake. He is not in acute distress.    Appearance: Normal appearance. He is well-developed and well-groomed. He is not ill-appearing.  Musculoskeletal:     Left knee: Normal.     Left lower leg: Normal. No swelling, deformity, lacerations, tenderness or bony tenderness. No edema.     Left ankle: Normal.     Left foot: Normal.  Skin:    General: Skin is warm and dry.  Neurological:     Mental Status: He is alert.  Psychiatric:  Behavior: Behavior is cooperative.      UC Treatments / Results  Labs (all labs ordered are listed, but only abnormal results are displayed) Labs Reviewed - No data to display  EKG   Radiology DG Tibia/Fibula Left Result Date: 05/20/2024 CLINICAL DATA:  lower leg pain. EXAM: LEFT TIBIA AND FIBULA - 2 VIEW COMPARISON:  None Available. FINDINGS: No acute fracture or dislocation. No  aggressive osseous lesion. There are degenerative changes of the knee joint in the form of mildly reduced tibio-femoral compartment joint space, tibial spiking and tricompartmental osteophytosis. Ankle mortise appears intact. No focal soft tissue swelling. No radiopaque foreign bodies. IMPRESSION: *No acute osseous abnormality of the left tibia and fibula. Electronically Signed   By: Ree Molt M.D.   On: 05/20/2024 11:26    Procedures Procedures (including critical care time)  Medications Ordered in UC Medications - No data to display  Initial Impression / Assessment and Plan / UC Course  I have reviewed the triage vital signs and the nursing notes.  Pertinent labs & imaging results that were available during my care of the patient were reviewed by me and considered in my medical decision making (see chart for details).     Patient is overall well-appearing.  Vitals are stable.  X-ray ordered.  Based on my interpretation there is no underlying fracture or dislocation noted.  There is also no evidence of bone metastasis noted on x-ray either.  Radiology report confirms this.  Prescribed short course of prednisone to assist with pain.  Recommended continuing hydrocodone and Tylenol as needed for pain.  Given orthopedic follow-up.  Discussed follow-up and return precautions. Final Clinical Impressions(s) / UC Diagnoses   Final diagnoses:  Pain of left lower leg     Discharge Instructions      Your x-ray is negative for any underlying fracture or dislocation. Start taking prednisone 2 tablets once daily for 3 days to help reduce inflammation related to your pain. You can continue taking hydrocodone and Tylenol as needed for pain. Rest, ice, and elevate your leg often throughout the day. You can follow-up with [facility] for further evaluation of your pain. Otherwise she can follow-up with your primary care provider or return here as needed.     ED Prescriptions      Medication Sig Dispense Auth. Provider   predniSONE (DELTASONE) 20 MG tablet Take 2 tablets (40 mg total) by mouth daily for 3 days. 6 tablet Johnie Flaming A, NP      PDMP not reviewed this encounter.   Johnie Flaming A, NP 05/20/24 1143

## 2024-05-23 ENCOUNTER — Other Ambulatory Visit: Payer: Self-pay

## 2024-05-23 DIAGNOSIS — M79605 Pain in left leg: Secondary | ICD-10-CM

## 2024-05-23 DIAGNOSIS — M545 Low back pain, unspecified: Secondary | ICD-10-CM

## 2024-06-01 ENCOUNTER — Ambulatory Visit: Admitting: Orthopaedic Surgery

## 2024-06-03 ENCOUNTER — Ambulatory Visit
Admission: RE | Admit: 2024-06-03 | Discharge: 2024-06-03 | Disposition: A | Discharge status: Home / Self Care | Source: Ambulatory Visit

## 2024-06-03 DIAGNOSIS — M79662 Pain in left lower leg: Secondary | ICD-10-CM | POA: Diagnosis not present

## 2024-06-03 DIAGNOSIS — M4316 Spondylolisthesis, lumbar region: Secondary | ICD-10-CM | POA: Diagnosis not present

## 2024-06-03 DIAGNOSIS — M48061 Spinal stenosis, lumbar region without neurogenic claudication: Secondary | ICD-10-CM | POA: Diagnosis not present

## 2024-06-03 DIAGNOSIS — M545 Low back pain, unspecified: Secondary | ICD-10-CM

## 2024-06-03 DIAGNOSIS — M5126 Other intervertebral disc displacement, lumbar region: Secondary | ICD-10-CM | POA: Diagnosis not present

## 2024-06-03 DIAGNOSIS — M47816 Spondylosis without myelopathy or radiculopathy, lumbar region: Secondary | ICD-10-CM | POA: Diagnosis not present

## 2024-06-03 DIAGNOSIS — M79605 Pain in left leg: Secondary | ICD-10-CM

## 2024-06-03 MED ORDER — GADOPICLENOL 0.5 MMOL/ML IV SOLN
8.0000 mL | Freq: Once | INTRAVENOUS | Status: AC | PRN
  Administered 2024-06-03: 8 mL via INTRAVENOUS

## 2024-06-07 ENCOUNTER — Other Ambulatory Visit (HOSPITAL_COMMUNITY)

## 2024-06-07 ENCOUNTER — Other Ambulatory Visit

## 2024-06-08 ENCOUNTER — Ambulatory Visit (INDEPENDENT_AMBULATORY_CARE_PROVIDER_SITE_OTHER): Admitting: Orthopaedic Surgery

## 2024-06-08 DIAGNOSIS — M5416 Radiculopathy, lumbar region: Secondary | ICD-10-CM | POA: Diagnosis not present

## 2024-06-08 MED ORDER — HYDROCODONE-ACETAMINOPHEN 7.5-325 MG PO TABS
1.0000 | ORAL_TABLET | Freq: Four times a day (QID) | ORAL | 0 refills | Status: DC | PRN
Start: 2024-06-08 — End: 2024-08-23

## 2024-06-08 NOTE — Progress Notes (Signed)
 Office Visit Note   Patient: William Wells           Date of Birth: 08-14-1934           MRN: [id number] Visit Date: 06/08/2024              Requested by: Shona Norleen PEDLAR, MD [address] [place],  [place] [address] PCP: Shona Norleen PEDLAR, MD   Assessment & Plan: Visit Diagnoses:  1. Radiculopathy, lumbar region     Plan: History of Present Illness William Wells is an 89 year old male with spinal degeneration who presents with bilateral lower leg pain.  He experiences severe bilateral leg pain in the lower legs, which significantly impairs his ability to stand or walk. The pain intensifies when turning while standing and can be severe enough to cause tears. He occasionally experiences leg weakness, which he attributes to the pain. The pain worsens with prolonged standing, such as during dishwashing, and is primarily below the knee but can extend higher.  He also has back pain, previously managed with injections that provided long-lasting relief. An MRI of Lspine and left tib fib have been performed, but the official reports are pending.  His current medications include gabapentin  and hydrocodone , used sparingly due to his primary prescription for headaches. He requires stronger pain relief due to the severity of his symptoms.  Lumbar spine exam show paraspinal tenderness. Baseline function of bilateral lower extremities.  Results RADIOLOGY Lumbar spine MRI: Independently reviewed and interpreted shows widespread degeneration of the facet joints and intervertebral discs.  Central canal stenosis.  Assessment and Plan Lumbar spinal stenosis with radiculopathy Chronic lumbar spinal stenosis with radiculopathy causing bilateral lower extremity pain and occasional weakness. MRI shows significant degeneration with spinal canal narrowing and nerve compression. Probably not a surgical candidate due to age. - Refer to Dr. Eldonna for interventional spine injections. - Coordinate with Dr. Eldonna  to discuss the treatment plan for injections. - Will increase pain medication  Follow-Up Instructions: No follow-ups on file.   Orders:  Orders Placed This Encounter  Procedures   Ambulatory referral to Physical Medicine Rehab   Meds ordered this encounter  Medications   HYDROcodone -acetaminophen  (NORCO) 7.5-325 MG tablet    Sig: Take 1 tablet by mouth every 6 (six) hours as needed for moderate pain (pain score 4-6).    Dispense:  10 tablet    Refill:  0     Subjective: Chief Complaint  Patient presents with   Left Leg - Pain   Lower Back - Pain     Review of Systems  Constitutional: Negative.   HENT: Negative.    Eyes: Negative.   Respiratory: Negative.    Cardiovascular: Negative.   Gastrointestinal: Negative.   Endocrine: Negative.   Genitourinary: Negative.   Skin: Negative.   Allergic/Immunologic: Negative.   Neurological: Negative.   Hematological: Negative.   Psychiatric/Behavioral: Negative.    All other systems reviewed and are negative.    Objective: Vital Signs: There were no vitals taken for this visit.  Physical Exam Vitals and nursing note reviewed.  Constitutional:      Appearance: He is well-developed.  HENT:     Head: Normocephalic and atraumatic.  Eyes:     Pupils: Pupils are equal, round, and reactive to light.  Pulmonary:     Effort: Pulmonary effort is normal.  Abdominal:     Palpations: Abdomen is soft.  Musculoskeletal:        General:  Normal range of motion.     Cervical back: Neck supple.  Skin:    General: Skin is warm.  Neurological:     Mental Status: He is alert and oriented to person, place, and time.  Psychiatric:        Behavior: Behavior normal.        Thought Content: Thought content normal.        Judgment: Judgment normal.     PMFS History: Patient Active Problem List   Diagnosis Date Noted   Radiculopathy, lumbar region 06/08/2024   DOE (dyspnea on exertion) 01/05/2024   HLD (hyperlipidemia) 01/05/2024    Second degree AV block, Mobitz type I 01/05/2024   Headache 11/10/2023   Adenocarcinoma of left lung, stage 1 (HCC) 06/26/2020   Status post robot-assisted surgical procedure 06/22/2020   S/P robot-assisted surgical procedure 06/22/2020   Mass of upper lobe of left lung 04/11/2020   Monoclonal gammopathy of unknown significance (MGUS) 03/28/2020   Normocytic anemia 03/28/2020   Hyperkalemia 07/15/2019   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) 07/15/2019   Benign hypertension with chronic kidney disease 07/15/2019   Sciatica, left side 02/17/2018   Past Medical History:  Diagnosis Date   Acid reflux disease    Arthritis    Complication of anesthesia    appendix surgery (89 year old) - passed out once patient got back to floor unit   Diabetes mellitus    Dyspnea    Family history of adverse reaction to anesthesia    daughter states that she has a difficult time waking up   Hiatal hernia    High cholesterol    Hypertension    Hypothyroidism    Lung cancer (HCC)    s/p XI robotic assisted thoracoscopy for LU lobectomy 06/22/20   Monoclonal gammopathy of unknown significance (MGUS) 03/28/2020   Normocytic anemia 03/28/2020   Renal disorder    renal insufficiency   Type 2 diabetes mellitus (HCC)     Family History  Problem Relation Age of Onset   Diabetes Mother    Cancer Father    Cancer Sister    Diabetes Brother    Cancer Brother    Diabetes Brother    Parkinson's disease Brother    Diabetes Brother     Past Surgical History:  Procedure Laterality Date   APPENDECTOMY     ARTERY BIOPSY Right 11/23/2023   Procedure: RIGHT BIOPSY TEMPORAL ARTERY;  Surgeon: Gretta Lonni PARAS, MD;  Location: [facility] OR;  Service: Vascular;  Laterality: Right;   BRONCHIAL BIOPSY  05/29/2020   Procedure: BRONCHIAL BIOPSIES;  Surgeon: Shelah Lamar RAMAN, MD;  Location: [facility];  Service: Pulmonary;;   BRONCHIAL BRUSHINGS  05/29/2020   Procedure: BRONCHIAL BRUSHINGS;   Surgeon: Shelah Lamar RAMAN, MD;  Location: [facility] ENDOSCOPY;  Service: Pulmonary;;   BRONCHIAL NEEDLE ASPIRATION BIOPSY  05/29/2020   Procedure: BRONCHIAL NEEDLE ASPIRATION BIOPSIES;  Surgeon: Shelah Lamar RAMAN, MD;  Location: [facility]OS[facility];  Service: Pulmonary;;   BRONCHIAL WASHINGS  05/29/2020   Procedure: BRONCHIAL WASHINGS;  Surgeon: Shelah Lamar RAMAN, MD;  Location: [facility] ENDOSCOPY;  Service: Pulmonary;;   CATARACT EXTRACTION W/PHACO Left 01/23/2023   Procedure: CATARACT EXTRACTION PHACO AND INTRAOCULAR LENS PLACEMENT (IOC);  Surgeon: Harrie Agent, MD;  Location: [facility]S;  Service: Ophthalmology;  Laterality: Left;  CDE: 13.69   CATARACT EXTRACTION W/PHACO Right 02/20/2023   Procedure: CATARACT EXTRACTION PHACO AND INTRAOCULAR LENS PLACEMENT (IOC);  Surgeon: Harrie Agent, MD;  Location: [facility];  Service: Ophthalmology;  Laterality: Right;  CDE: 12.88   CERVICAL SPINE SURGERY     EYE SURGERY     strabismus   INTERCOSTAL NERVE BLOCK Left 06/22/2020   Procedure: INTERCOSTAL NERVE BLOCK;  Surgeon: Kerrin Elspeth BROCKS, MD;  Location: [facility] OR;  Service: Thoracic;  Laterality: Left;   NODE DISSECTION Left 06/22/2020   Procedure: NODE DISSECTION;  Surgeon: Kerrin Elspeth BROCKS, MD;  Location: [facility] OR;  Service: Thoracic;  Laterality: Left;   TONSILLECTOMY     VIDEO BRONCHOSCOPY WITH ENDOBRONCHIAL NAVIGATION N/A 05/29/2020   Procedure: VIDEO BRONCHOSCOPY WITH ENDOBRONCHIAL NAVIGATION;  Surgeon: Shelah Lamar RAMAN, MD;  Location: [facility] ENDOSCOPY;  Service: Pulmonary;  Laterality: N/A;   Social History   Occupational History   Occupation: RETIRED  Tobacco Use   Smoking status: Former    Current packs/day: 0.00    Average packs/day: 0.5 packs/day for 47.0 years (23.5 ttl pk-yrs)    Types: Cigarettes    Start date: 43    Quit date: 2000    Years since quitting: 25.6   Smokeless tobacco: Never  Vaping Use   Vaping status: Never Used  Substance and Sexual Activity   Alcohol use: No   Drug use: No    Sexual activity: Not Currently

## 2024-06-09 DIAGNOSIS — E119 Type 2 diabetes mellitus without complications: Secondary | ICD-10-CM | POA: Diagnosis not present

## 2024-06-09 DIAGNOSIS — H35363 Drusen (degenerative) of macula, bilateral: Secondary | ICD-10-CM | POA: Diagnosis not present

## 2024-06-13 DIAGNOSIS — H353211 Exudative age-related macular degeneration, right eye, with active choroidal neovascularization: Secondary | ICD-10-CM | POA: Diagnosis not present

## 2024-06-13 DIAGNOSIS — H3581 Retinal edema: Secondary | ICD-10-CM | POA: Diagnosis not present

## 2024-06-14 ENCOUNTER — Ambulatory Visit: Admitting: Hematology

## 2024-06-14 ENCOUNTER — Inpatient Hospital Stay: Admitting: Physician Assistant

## 2024-06-23 DIAGNOSIS — E1122 Type 2 diabetes mellitus with diabetic chronic kidney disease: Secondary | ICD-10-CM | POA: Diagnosis not present

## 2024-06-23 DIAGNOSIS — R002 Palpitations: Secondary | ICD-10-CM | POA: Diagnosis not present

## 2024-06-23 DIAGNOSIS — R0600 Dyspnea, unspecified: Secondary | ICD-10-CM | POA: Diagnosis not present

## 2024-06-23 DIAGNOSIS — J45909 Unspecified asthma, uncomplicated: Secondary | ICD-10-CM | POA: Diagnosis not present

## 2024-06-23 DIAGNOSIS — E559 Vitamin D deficiency, unspecified: Secondary | ICD-10-CM | POA: Diagnosis not present

## 2024-06-30 DIAGNOSIS — N1832 Chronic kidney disease, stage 3b: Secondary | ICD-10-CM | POA: Diagnosis not present

## 2024-06-30 DIAGNOSIS — E1122 Type 2 diabetes mellitus with diabetic chronic kidney disease: Secondary | ICD-10-CM | POA: Diagnosis not present

## 2024-06-30 DIAGNOSIS — M109 Gout, unspecified: Secondary | ICD-10-CM | POA: Diagnosis not present

## 2024-06-30 DIAGNOSIS — E1165 Type 2 diabetes mellitus with hyperglycemia: Secondary | ICD-10-CM | POA: Diagnosis not present

## 2024-06-30 DIAGNOSIS — E782 Mixed hyperlipidemia: Secondary | ICD-10-CM | POA: Diagnosis not present

## 2024-06-30 DIAGNOSIS — K219 Gastro-esophageal reflux disease without esophagitis: Secondary | ICD-10-CM | POA: Diagnosis not present

## 2024-06-30 DIAGNOSIS — I440 Atrioventricular block, first degree: Secondary | ICD-10-CM | POA: Diagnosis not present

## 2024-06-30 DIAGNOSIS — R0600 Dyspnea, unspecified: Secondary | ICD-10-CM | POA: Diagnosis not present

## 2024-06-30 DIAGNOSIS — R519 Headache, unspecified: Secondary | ICD-10-CM | POA: Diagnosis not present

## 2024-06-30 DIAGNOSIS — R002 Palpitations: Secondary | ICD-10-CM | POA: Diagnosis not present

## 2024-06-30 DIAGNOSIS — E039 Hypothyroidism, unspecified: Secondary | ICD-10-CM | POA: Diagnosis not present

## 2024-06-30 DIAGNOSIS — I129 Hypertensive chronic kidney disease with stage 1 through stage 4 chronic kidney disease, or unspecified chronic kidney disease: Secondary | ICD-10-CM | POA: Diagnosis not present

## 2024-07-01 ENCOUNTER — Encounter: Payer: Self-pay | Admitting: Internal Medicine

## 2024-07-01 ENCOUNTER — Ambulatory Visit: Attending: Internal Medicine | Admitting: Internal Medicine

## 2024-07-01 VITALS — BP 138/62 | HR 60 | Ht 68.0 in | Wt 181.6 lb

## 2024-07-01 DIAGNOSIS — I441 Atrioventricular block, second degree: Secondary | ICD-10-CM | POA: Diagnosis not present

## 2024-07-01 NOTE — Patient Instructions (Addendum)

## 2024-07-01 NOTE — Progress Notes (Signed)
 Cardiology Office Note  Date: 07/01/2024   ID: William Wells, William Wells, William Wells, MRN [id number]  PCP:  Shona Norleen PEDLAR, MD  Cardiologist:  Diannah SHAUNNA Maywood, MD Electrophysiologist:  None   History of Present Illness: William Wells is a 89 y.o. male known to have CKD stage IIIb, HTN, HLD, lung cancer s/p lobectomy in 2021, DM 2 was referred to cardiology clinic for evaluation of DOE.  April 2025: Patient reported that he had lung cancer followed by lobectomy in 2021 after which he noticed having SOB but worsened in the last 1 year.  He notices shortness of breath mainly with overexertion activities like walking his dog for a long time, taking a shower for long time etc.  He does not have any SOB with ADLs.  No orthopnea, PND or leg swelling.  No chest pain, palpitations, syncope.  Occasional dizziness.  EKG from February 2025 showed NSR, second-degree AV block Mobitz type I.  Fatigue in the a.m. but clears towards the end of the day.  Echocardiogram performed in December 2024 showed normal LVEF, normal RV function, indeterminate diastology, CVP 3 mmHg.  Event monitor in May 2025 was unremarkable except for Wenckebach phenomenon when he was symptomatic.  No high-grade AV block was noted.  NM stress test was performed in April 2025 that showed findings consistent with prior inferior/inferoseptal infarction with mild.  For ischemia, low risk study.  Nuclear LVEF was within normal limits.  Patient is here for follow-up visit.  He attended his grandsons wedding in [place], [place] a few months ago.  He walked a lot as there were no cars.  He did not have any shortness of breath at that time.  No angina.  Denies having any dizziness, lightheadedness, syncope, leg swelling.  Doing great overall.  EKG today showed NSR, second-degree AV block Mobitz type I.  Past Medical History:  Diagnosis Date   Acid reflux disease    Arthritis    Complication of anesthesia    appendix surgery (89 year old) - passed out  once patient got back to floor unit   Diabetes mellitus    Dyspnea    Family history of adverse reaction to anesthesia    daughter states that she has a difficult time waking up   Hiatal hernia    High cholesterol    Hypertension    Hypothyroidism    Lung cancer (HCC)    s/p XI robotic assisted thoracoscopy for LU lobectomy 06/22/20   Monoclonal gammopathy of unknown significance (MGUS) 06/Wells/2021   Normocytic anemia 06/Wells/2021   Renal disorder    renal insufficiency   Type 2 diabetes mellitus (HCC)     Past Surgical History:  Procedure Laterality Date   APPENDECTOMY     ARTERY BIOPSY Right 11/23/2023   Procedure: RIGHT BIOPSY TEMPORAL ARTERY;  Surgeon: Gretta Lonni PARAS, MD;  Location: [facility] OR;  Service: Vascular;  Laterality: Right;   BRONCHIAL BIOPSY  05/29/2020   Procedure: BRONCHIAL BIOPSIES;  Surgeon: Shelah Lamar RAMAN, MD;  Location: [facility] ENDOSCOPY;  Service: Pulmonary;;   BRONCHIAL BRUSHINGS  05/29/2020   Procedure: BRONCHIAL BRUSHINGS;  Surgeon: Shelah Lamar RAMAN, MD;  Location: [facility] ENDOSCOPY;  Service: Pulmonary;;   BRONCHIAL NEEDLE ASPIRATION BIOPSY  05/29/2020   Procedure: BRONCHIAL NEEDLE ASPIRATION BIOPSIES;  Surgeon: Shelah Lamar RAMAN, MD;  Location: [facility] ENDOSCOPY;  Service: Pulmonary;;   BRONCHIAL WASHINGS  05/29/2020   Procedure: BRONCHIAL WASHINGS;  Surgeon: Shelah Lamar RAMAN, MD;  Location: [facility] ENDOSCOPY;  Service: Pulmonary;;  CATARACT EXTRACTION W/PHACO Left 01/23/2023   Procedure: CATARACT EXTRACTION PHACO AND INTRAOCULAR LENS PLACEMENT (IOC);  Surgeon: Harrie Agent, MD;  Location: [facility] ORS;  Service: Ophthalmology;  Laterality: Left;  CDE: 13.69   CATARACT EXTRACTION W/PHACO Right 02/20/2023   Procedure: CATARACT EXTRACTION PHACO AND INTRAOCULAR LENS PLACEMENT (IOC);  Surgeon: Harrie Agent, MD;  Location: [facility] ORS;  Service: Ophthalmology;  Laterality: Right;  CDE: 12.88   CERVICAL SPINE SURGERY     EYE SURGERY     strabismus   INTERCOSTAL NERVE BLOCK Left 06/22/2020    Procedure: INTERCOSTAL NERVE BLOCK;  Surgeon: Kerrin Elspeth BROCKS, MD;  Location: [facility] OR;  Service: Thoracic;  Laterality: Left;   NODE DISSECTION Left 06/22/2020   Procedure: NODE DISSECTION;  Surgeon: Kerrin Elspeth BROCKS, MD;  Location: [facility] OR;  Service: Thoracic;  Laterality: Left;   TONSILLECTOMY     VIDEO BRONCHOSCOPY WITH ENDOBRONCHIAL NAVIGATION N/A 05/29/2020   Procedure: VIDEO BRONCHOSCOPY WITH ENDOBRONCHIAL NAVIGATION;  Surgeon: Shelah Lamar RAMAN, MD;  Location: [facility] ENDOSCOPY;  Service: Pulmonary;  Laterality: N/A;    Current Outpatient Medications  Medication Sig Dispense Refill   acetaminophen  (TYLENOL ) 500 MG tablet Take 2 tablets (1,000 mg total) by mouth every 6 (six) hours. 30 tablet 0   albuterol  (VENTOLIN  HFA) 108 (90 Base) MCG/ACT inhaler INAHLE 2 PUFFS BY MOUTH 4 TIMES A DAY AS NEEDED FOR COUGH/WHEEZING     diclofenac Sodium (VOLTAREN) 1 % GEL Apply 2 g topically daily as needed (pain).     docusate sodium  (COLACE) 100 MG capsule Take 1 capsule (100 mg total) by mouth 2 (two) times daily as needed for mild constipation. (Patient taking differently: Take 100 mg by mouth 2 (two) times a week.) 10 capsule 0   ferrous sulfate 325 (65 FE) MG tablet Take 325 mg by mouth 2 (two) times a week.     gabapentin  (NEURONTIN ) 300 MG capsule Take 300 mg by mouth 3 (three) times daily.     glipiZIDE  (GLUCOTROL  XL) 2.5 MG 24 hr tablet Take 2.5 mg by mouth daily as needed (blood sugar above 160).      HYDROcodone -acetaminophen  (NORCO) 7.5-325 MG tablet Take 1 tablet by mouth every 6 (six) hours as needed for moderate pain (pain score 4-6). 10 tablet 0   Lancets (ONETOUCH DELICA PLUS LANCET30G) MISC 2 (two) times daily.     levothyroxine  (SYNTHROID ) 75 MCG tablet Take 75 mcg by mouth daily before breakfast.     LOKELMA 5 g packet Take 5 g by mouth every Monday, Wednesday, and Friday.     loratadine (CLARITIN) 10 MG tablet Take 1 tablet by mouth daily.     Multiple Vitamin (MULTIVITAMIN ADULT  PO) Take 1 tablet by mouth daily.     Multiple Vitamins-Minerals (PRESERVISION AREDS PO) Take 1 tablet by mouth in the morning and at bedtime.     mupirocin ointment (BACTROBAN) 2 % Apply 1 Application topically 3 (three) times daily.     omeprazole (PRILOSEC) 20 MG capsule Take 20 mg by mouth daily.     ONETOUCH ULTRA test strip 2 (two) times daily.     oxyCODONE  (ROXICODONE ) 5 MG immediate release tablet Take 1 tablet (5 mg total) by mouth every 6 (six) hours as needed for severe pain (pain score 7-10). 10 tablet 0   rosuvastatin  (CRESTOR ) 40 MG tablet Take 40 mg by mouth daily.      telmisartan (MICARDIS) 20 MG tablet Take 10 mg by mouth daily.     VITAMIN D   PO Take 2,000 Units by mouth daily.     VITAMIN E PO Take 1 capsule by mouth daily.     No current facility-administered medications for this visit.   Allergies:  Patient has no known allergies.   Social History: The patient  reports that he quit smoking about 25 years ago. His smoking use included cigarettes. He started smoking about 72 years ago. He has a Wells.5 pack-year smoking history. He has never used smokeless tobacco. He reports that he does not drink alcohol and does not use drugs.   Family History: The patient's family history includes Cancer in his brother, father, and sister; Diabetes in his brother, brother, brother, and mother; Parkinson's disease in his brother.   ROS:  Please see the history of present illness. Otherwise, complete review of systems is positive for none.  All other systems are reviewed and negative.   Physical Exam: VS:  BP 138/62   Pulse 60   Ht 5' 8 (1.727 m)   Wt 181 lb 9.6 oz (82.4 kg)   SpO2 100%   BMI 27.61 kg/m , BMI Body mass index is 27.61 kg/m.  Wt Readings from Last 3 Encounters:  07/01/24 181 lb 9.6 oz (82.4 kg)  05/20/24 178 lb 9.6 oz (81 kg)  04/19/24 178 lb 1.6 oz (80.8 kg)    General: Patient appears comfortable at rest. HEENT: Conjunctiva and lids normal, oropharynx clear  with moist mucosa. Neck: Supple, no elevated JVP or carotid bruits, no thyromegaly. Lungs: Clear to auscultation, nonlabored breathing at rest. Cardiac: Regular rate and rhythm, no S3 or significant systolic murmur, no pericardial rub. Abdomen: Soft, nontender, no hepatomegaly, bowel sounds present, no guarding or rebound. Extremities: No pitting edema, distal pulses 2+. Skin: Warm and dry. Musculoskeletal: No kyphosis. Neuropsychiatric: Alert and oriented x3, affect grossly appropriate.  Recent Labwork: 04/19/2024: ALT 26; AST 19; BUN 34; Creatinine 1.95; Hemoglobin 11.9; Platelet Count 154; Potassium 5.0; Sodium 140  No results found for: CHOL, TRIG, HDL, CHOLHDL, VLDL, LDLCALC, LDLDIRECT   Assessment and Plan:  DOE: Resolved.  SOB started after he had lung lobectomy 2021 but noticed it worsening in the last 1 year.  He walked in [place], [place] quite a lot for his grandsons wedding and did not have any shortness of breath.  Compensated. Not volume overloaded. Echocardiogram in December 2024 showed normal LVEF, indeterminate diastology, CVP 3 mmHg, normal RV function and no valvular heart disease. NM stress test in April 2025 showed findings consistent with prior inferior/inferoseptal infarction with mild peri-infarct ischemia, low risk study.  He denied having any interval DOE in today's visit.  No further cardiac workup at this time.  Second-degree AV block/Mobitz type I: Denies dizziness, lightheadedness, syncope.  EKG today showed NSR, second-degree AV block Mobitz type I.  Event monitor revealed similar findings Wenckebach phenomenon and was symptomatic.  No high-grade AV block noted.  Will continue to monitor.  Discussed symptoms of high-grade AV block with the patient.  HTN, controlled: Currently on telmisartan 10 mg once daily.  He has CKD stage IIIb.  Will defer this to his PCP or nephrology.  HLD, known values: Continue rosuvastatin  40 mg nightly, goal LDL less than 100.   Follows with PCP.  30 minutes spent in remainder prior records, labs, reports, discussion of the above problems with the patient, documentation and answering all his questions.   Medication Adjustments/Labs and Tests Ordered: Current medicines are reviewed at length with the patient today.  Concerns regarding medicines are outlined  above.    Disposition:  Follow up 1 year  Signed, Feras Gardella Arleta Maywood, MD, 07/01/2024 11:57 AM    [facility] at [facility] [address] [place], [place] [address]

## 2024-07-05 ENCOUNTER — Ambulatory Visit: Admitting: Physical Medicine and Rehabilitation

## 2024-07-05 ENCOUNTER — Other Ambulatory Visit: Payer: Self-pay

## 2024-07-05 VITALS — BP 150/71 | HR 69

## 2024-07-05 DIAGNOSIS — M48062 Spinal stenosis, lumbar region with neurogenic claudication: Secondary | ICD-10-CM

## 2024-07-05 DIAGNOSIS — M5416 Radiculopathy, lumbar region: Secondary | ICD-10-CM | POA: Diagnosis not present

## 2024-07-05 MED ORDER — METHYLPREDNISOLONE ACETATE 80 MG/ML IJ SUSP
40.0000 mg | Freq: Once | INTRAMUSCULAR | Status: AC
  Administered 2024-07-05: 40 mg

## 2024-07-05 NOTE — Procedures (Signed)
 Lumbosacral Transforaminal Epidural Steroid Injection - Sub-Pedicular Approach with Fluoroscopic Guidance  Patient: William Wells      Date of Birth: 29-Mar-1934 MRN: [id number] PCP: Shona Norleen PEDLAR, MD      Visit Date: 07/05/2024   Universal Protocol:    Date/Time: 07/05/2024  Consent Given By: the patient  Position: PRONE  Additional Comments: Vital signs were monitored before and after the procedure. Patient was prepped and draped in the usual sterile fashion. The correct patient, procedure, and site was verified.   Injection Procedure Details:   Procedure diagnoses: Lumbar radiculopathy [M54.16]    Meds Administered:  Meds ordered this encounter  Medications   methylPREDNISolone  acetate (DEPO-MEDROL ) injection 40 mg    Laterality: Bilateral  Location/Site: L4  Needle:5.0 in., 22 ga.  Short bevel or Quincke spinal needle  Needle Placement: Transforaminal  Findings:    -Comments: Excellent flow of contrast along the nerve, nerve root and into the epidural space.  Procedure Details: After squaring off the end-plates to get a true AP view, the C-arm was positioned so that an oblique view of the foramen as noted above was visualized. The target area is just inferior to the nose of the scotty dog or sub pedicular. The soft tissues overlying this structure were infiltrated with 2-3 ml. of 1% Lidocaine  without Epinephrine .  The spinal needle was inserted toward the target using a trajectory view along the fluoroscope beam.  Under AP and lateral visualization, the needle was advanced so it did not puncture dura and was located close the 6 O'Clock position of the pedical in AP tracterory. Biplanar projections were used to confirm position. Aspiration was confirmed to be negative for CSF and/or blood. A 1-2 ml. volume of Isovue -250 was injected and flow of contrast was noted at each level. Radiographs were obtained for documentation purposes.   After attaining the desired flow  of contrast documented above, a 0.5 to 1.0 ml test dose of 0.25% Marcaine  was injected into each respective transforaminal space.  The patient was observed for 90 seconds post injection.  After no sensory deficits were reported, and normal lower extremity motor function was noted,   the above injectate was administered so that equal amounts of the injectate were placed at each foramen (level) into the transforaminal epidural space.   Additional Comments:  The patient tolerated the procedure well Dressing: 2 x 2 sterile gauze and Band-Aid    Post-procedure details: Patient was observed during the procedure. Post-procedure instructions were reviewed.  Patient left the clinic in stable condition.

## 2024-07-05 NOTE — Progress Notes (Signed)
 William Wells - 89 y.o. male MRN [id number]  Date of birth: April 22, 1934  Office Visit Note: Visit Date: 07/05/2024 PCP: Shona Norleen PEDLAR, MD Referred by: Shona Norleen PEDLAR, MD  Subjective: Chief Complaint  Patient presents with   Lower Back - Pain   HPI:  William Wells is a 89 y.o. male who comes in today at the request of Dr. Ozell Cummins for planned Bilateral L4-5 Lumbar Transforaminal epidural steroid injection with fluoroscopic guidance.  The patient has failed conservative care including home exercise, medications, time and activity modification.  This injection will be diagnostic and hopefully therapeutic.  Please see requesting physician notes for further details and justification.  Prior injection in 2019 at the imaging center.  This was an L2-3 interlaminar injection for what was a disc extrusion at the time at L3-4 more left-sided lateral recess narrowing but he did have the severe stenosis then as well.  Consideration still for perhaps L5-S1 or L2 S3 interlaminar injection.   ROS Otherwise per HPI.  Assessment & Plan: Visit Diagnoses:    ICD-10-CM   1. Lumbar radiculopathy  M54.16 XR C-ARM NO REPORT    Epidural Steroid injection    methylPREDNISolone  acetate (DEPO-MEDROL ) injection 40 mg    2. Spinal stenosis of lumbar region with neurogenic claudication  M48.062       Plan: No additional findings.   Meds & Orders:  Meds ordered this encounter  Medications   methylPREDNISolone  acetate (DEPO-MEDROL ) injection 40 mg    Orders Placed This Encounter  Procedures   XR C-ARM NO REPORT   Epidural Steroid injection    Follow-up: Return for visit to requesting provider as needed.   Procedures: No procedures performed  Lumbosacral Transforaminal Epidural Steroid Injection - Sub-Pedicular Approach with Fluoroscopic Guidance  Patient: William Wells      Date of Birth: 1933-12-02 MRN: [id number] PCP: Shona Norleen PEDLAR, MD      Visit Date: 07/05/2024   Universal Protocol:    Date/Time:  07/05/2024  Consent Given By: the patient  Position: PRONE  Additional Comments: Vital signs were monitored before and after the procedure. Patient was prepped and draped in the usual sterile fashion. The correct patient, procedure, and site was verified.   Injection Procedure Details:   Procedure diagnoses: Lumbar radiculopathy [M54.16]    Meds Administered:  Meds ordered this encounter  Medications   methylPREDNISolone  acetate (DEPO-MEDROL ) injection 40 mg    Laterality: Bilateral  Location/Site: L4  Needle:5.0 in., 22 ga.  Short bevel or Quincke spinal needle  Needle Placement: Transforaminal  Findings:    -Comments: Excellent flow of contrast along the nerve, nerve root and into the epidural space.  Procedure Details: After squaring off the end-plates to get a true AP view, the C-arm was positioned so that an oblique view of the foramen as noted above was visualized. The target area is just inferior to the nose of the scotty dog or sub pedicular. The soft tissues overlying this structure were infiltrated with 2-3 ml. of 1% Lidocaine  without Epinephrine .  The spinal needle was inserted toward the target using a trajectory view along the fluoroscope beam.  Under AP and lateral visualization, the needle was advanced so it did not puncture dura and was located close the 6 O'Clock position of the pedical in AP tracterory. Biplanar projections were used to confirm position. Aspiration was confirmed to be negative for CSF and/or blood. A 1-2 ml. volume of Isovue -250 was injected and flow of contrast was  noted at each level. Radiographs were obtained for documentation purposes.   After attaining the desired flow of contrast documented above, a 0.5 to 1.0 ml test dose of 0.25% Marcaine  was injected into each respective transforaminal space.  The patient was observed for 90 seconds post injection.  After no sensory deficits were reported, and normal lower extremity motor function  was noted,   the above injectate was administered so that equal amounts of the injectate were placed at each foramen (level) into the transforaminal epidural space.   Additional Comments:  The patient tolerated the procedure well Dressing: 2 x 2 sterile gauze and Band-Aid    Post-procedure details: Patient was observed during the procedure. Post-procedure instructions were reviewed.  Patient left the clinic in stable condition.    Clinical History: No specialty comments available.     Objective:  VS:  HT:    WT:   BMI:     BP:(!) 150/71  HR:69bpm  TEMP: ( )  RESP:  Physical Exam Vitals and nursing note reviewed.  Constitutional:      General: He is not in acute distress.    Appearance: Normal appearance. He is not ill-appearing.  HENT:     Head: Normocephalic and atraumatic.     Right Ear: External ear normal.     Left Ear: External ear normal.     Nose: No congestion.  Eyes:     Extraocular Movements: Extraocular movements intact.  Cardiovascular:     Rate and Rhythm: Normal rate.     Pulses: Normal pulses.  Pulmonary:     Effort: Pulmonary effort is normal. No respiratory distress.  Abdominal:     General: There is no distension.     Palpations: Abdomen is soft.  Musculoskeletal:        General: Tenderness present. No signs of injury.     Cervical back: Neck supple.     Right lower leg: No edema.     Left lower leg: No edema.     Comments: Patient has good distal strength without clonus.  Skin:    Findings: No erythema or rash.  Neurological:     General: No focal deficit present.     Mental Status: He is alert and oriented to person, place, and time.     Cranial Nerves: No cranial nerve deficit.     Sensory: No sensory deficit.     Motor: No weakness or abnormal muscle tone.     Coordination: Coordination normal.     Gait: Gait abnormal.  Psychiatric:        Mood and Affect: Mood normal.        Behavior: Behavior normal.      Imaging: No  results found.

## 2024-07-05 NOTE — Progress Notes (Signed)
 Pain Scale   Average Pain 4 Patient advising he has lower back pain radiating bilaterally to legs. Patient advising his pain is chronic.        +Driver, -BT, -Dye Allergies.

## 2024-07-14 DIAGNOSIS — H26493 Other secondary cataract, bilateral: Secondary | ICD-10-CM | POA: Diagnosis not present

## 2024-07-14 DIAGNOSIS — H353211 Exudative age-related macular degeneration, right eye, with active choroidal neovascularization: Secondary | ICD-10-CM | POA: Diagnosis not present

## 2024-07-14 DIAGNOSIS — R809 Proteinuria, unspecified: Secondary | ICD-10-CM | POA: Diagnosis not present

## 2024-07-14 DIAGNOSIS — Z961 Presence of intraocular lens: Secondary | ICD-10-CM | POA: Diagnosis not present

## 2024-07-14 DIAGNOSIS — I10 Essential (primary) hypertension: Secondary | ICD-10-CM | POA: Diagnosis not present

## 2024-07-14 DIAGNOSIS — H40013 Open angle with borderline findings, low risk, bilateral: Secondary | ICD-10-CM | POA: Diagnosis not present

## 2024-07-14 DIAGNOSIS — H353122 Nonexudative age-related macular degeneration, left eye, intermediate dry stage: Secondary | ICD-10-CM | POA: Diagnosis not present

## 2024-07-14 DIAGNOSIS — N189 Chronic kidney disease, unspecified: Secondary | ICD-10-CM | POA: Diagnosis not present

## 2024-07-14 DIAGNOSIS — D631 Anemia in chronic kidney disease: Secondary | ICD-10-CM | POA: Diagnosis not present

## 2024-07-14 DIAGNOSIS — E119 Type 2 diabetes mellitus without complications: Secondary | ICD-10-CM | POA: Diagnosis not present

## 2024-07-20 DIAGNOSIS — E1129 Type 2 diabetes mellitus with other diabetic kidney complication: Secondary | ICD-10-CM | POA: Diagnosis not present

## 2024-07-20 DIAGNOSIS — N1832 Chronic kidney disease, stage 3b: Secondary | ICD-10-CM | POA: Diagnosis not present

## 2024-07-20 DIAGNOSIS — R809 Proteinuria, unspecified: Secondary | ICD-10-CM | POA: Diagnosis not present

## 2024-07-20 DIAGNOSIS — D638 Anemia in other chronic diseases classified elsewhere: Secondary | ICD-10-CM | POA: Diagnosis not present

## 2024-08-05 ENCOUNTER — Encounter (HOSPITAL_COMMUNITY): Payer: Self-pay

## 2024-08-05 ENCOUNTER — Inpatient Hospital Stay (HOSPITAL_COMMUNITY)
Admission: EM | Admit: 2024-08-05 | Discharge: 2024-08-23 | DRG: 242 | Disposition: A | Discharge status: Skilled Nursing Facility | Attending: Cardiology | Admitting: Cardiology

## 2024-08-05 ENCOUNTER — Emergency Department (HOSPITAL_COMMUNITY)

## 2024-08-05 ENCOUNTER — Other Ambulatory Visit: Payer: Self-pay

## 2024-08-05 DIAGNOSIS — Z79899 Other long term (current) drug therapy: Secondary | ICD-10-CM

## 2024-08-05 DIAGNOSIS — I200 Unstable angina: Principal | ICD-10-CM

## 2024-08-05 DIAGNOSIS — Z7982 Long term (current) use of aspirin: Secondary | ICD-10-CM

## 2024-08-05 DIAGNOSIS — I959 Hypotension, unspecified: Secondary | ICD-10-CM

## 2024-08-05 DIAGNOSIS — R54 Age-related physical debility: Secondary | ICD-10-CM | POA: Diagnosis present

## 2024-08-05 DIAGNOSIS — D631 Anemia in chronic kidney disease: Secondary | ICD-10-CM | POA: Diagnosis present

## 2024-08-05 DIAGNOSIS — L89312 Pressure ulcer of right buttock, stage 2: Secondary | ICD-10-CM | POA: Diagnosis not present

## 2024-08-05 DIAGNOSIS — D72829 Elevated white blood cell count, unspecified: Secondary | ICD-10-CM | POA: Diagnosis not present

## 2024-08-05 DIAGNOSIS — L8990 Pressure ulcer of unspecified site, unspecified stage: Secondary | ICD-10-CM | POA: Insufficient documentation

## 2024-08-05 DIAGNOSIS — I2119 ST elevation (STEMI) myocardial infarction involving other coronary artery of inferior wall: Secondary | ICD-10-CM | POA: Diagnosis not present

## 2024-08-05 DIAGNOSIS — N170 Acute kidney failure with tubular necrosis: Secondary | ICD-10-CM | POA: Diagnosis present

## 2024-08-05 DIAGNOSIS — I213 ST elevation (STEMI) myocardial infarction of unspecified site: Secondary | ICD-10-CM | POA: Diagnosis not present

## 2024-08-05 DIAGNOSIS — J939 Pneumothorax, unspecified: Secondary | ICD-10-CM | POA: Diagnosis present

## 2024-08-05 DIAGNOSIS — Z820 Family history of epilepsy and other diseases of the nervous system: Secondary | ICD-10-CM

## 2024-08-05 DIAGNOSIS — I2510 Atherosclerotic heart disease of native coronary artery without angina pectoris: Secondary | ICD-10-CM | POA: Diagnosis present

## 2024-08-05 DIAGNOSIS — Z66 Do not resuscitate: Secondary | ICD-10-CM | POA: Diagnosis present

## 2024-08-05 DIAGNOSIS — N138 Other obstructive and reflux uropathy: Secondary | ICD-10-CM | POA: Diagnosis not present

## 2024-08-05 DIAGNOSIS — R7989 Other specified abnormal findings of blood chemistry: Secondary | ICD-10-CM

## 2024-08-05 DIAGNOSIS — N401 Enlarged prostate with lower urinary tract symptoms: Secondary | ICD-10-CM | POA: Diagnosis not present

## 2024-08-05 DIAGNOSIS — Z751 Person awaiting admission to adequate facility elsewhere: Secondary | ICD-10-CM

## 2024-08-05 DIAGNOSIS — R509 Fever, unspecified: Secondary | ICD-10-CM | POA: Diagnosis not present

## 2024-08-05 DIAGNOSIS — E118 Type 2 diabetes mellitus with unspecified complications: Secondary | ICD-10-CM | POA: Diagnosis not present

## 2024-08-05 DIAGNOSIS — N184 Chronic kidney disease, stage 4 (severe): Secondary | ICD-10-CM | POA: Diagnosis present

## 2024-08-05 DIAGNOSIS — Z8249 Family history of ischemic heart disease and other diseases of the circulatory system: Secondary | ICD-10-CM

## 2024-08-05 DIAGNOSIS — W19XXXA Unspecified fall, initial encounter: Secondary | ICD-10-CM | POA: Diagnosis present

## 2024-08-05 DIAGNOSIS — Z833 Family history of diabetes mellitus: Secondary | ICD-10-CM

## 2024-08-05 DIAGNOSIS — R627 Adult failure to thrive: Secondary | ICD-10-CM | POA: Diagnosis present

## 2024-08-05 DIAGNOSIS — R338 Other retention of urine: Secondary | ICD-10-CM | POA: Insufficient documentation

## 2024-08-05 DIAGNOSIS — E875 Hyperkalemia: Secondary | ICD-10-CM | POA: Diagnosis not present

## 2024-08-05 DIAGNOSIS — N320 Bladder-neck obstruction: Secondary | ICD-10-CM | POA: Diagnosis not present

## 2024-08-05 DIAGNOSIS — R079 Chest pain, unspecified: Secondary | ICD-10-CM | POA: Diagnosis not present

## 2024-08-05 DIAGNOSIS — J439 Emphysema, unspecified: Secondary | ICD-10-CM | POA: Diagnosis present

## 2024-08-05 DIAGNOSIS — E871 Hypo-osmolality and hyponatremia: Secondary | ICD-10-CM | POA: Diagnosis present

## 2024-08-05 DIAGNOSIS — E7800 Pure hypercholesterolemia, unspecified: Secondary | ICD-10-CM | POA: Diagnosis present

## 2024-08-05 DIAGNOSIS — N189 Chronic kidney disease, unspecified: Secondary | ICD-10-CM | POA: Diagnosis not present

## 2024-08-05 DIAGNOSIS — Z794 Long term (current) use of insulin: Secondary | ICD-10-CM | POA: Diagnosis not present

## 2024-08-05 DIAGNOSIS — N179 Acute kidney failure, unspecified: Secondary | ICD-10-CM

## 2024-08-05 DIAGNOSIS — Z902 Acquired absence of lung [part of]: Secondary | ICD-10-CM

## 2024-08-05 DIAGNOSIS — I1310 Hypertensive heart and chronic kidney disease without heart failure, with stage 1 through stage 4 chronic kidney disease, or unspecified chronic kidney disease: Secondary | ICD-10-CM | POA: Diagnosis present

## 2024-08-05 DIAGNOSIS — Z87891 Personal history of nicotine dependence: Secondary | ICD-10-CM

## 2024-08-05 DIAGNOSIS — Z6825 Body mass index (BMI) 25.0-25.9, adult: Secondary | ICD-10-CM

## 2024-08-05 DIAGNOSIS — R197 Diarrhea, unspecified: Secondary | ICD-10-CM | POA: Diagnosis present

## 2024-08-05 DIAGNOSIS — R570 Cardiogenic shock: Secondary | ICD-10-CM | POA: Diagnosis present

## 2024-08-05 DIAGNOSIS — I442 Atrioventricular block, complete: Secondary | ICD-10-CM | POA: Diagnosis not present

## 2024-08-05 DIAGNOSIS — E1165 Type 2 diabetes mellitus with hyperglycemia: Secondary | ICD-10-CM | POA: Diagnosis present

## 2024-08-05 DIAGNOSIS — I441 Atrioventricular block, second degree: Secondary | ICD-10-CM | POA: Diagnosis present

## 2024-08-05 DIAGNOSIS — Z85118 Personal history of other malignant neoplasm of bronchus and lung: Secondary | ICD-10-CM

## 2024-08-05 DIAGNOSIS — E039 Hypothyroidism, unspecified: Secondary | ICD-10-CM | POA: Diagnosis present

## 2024-08-05 DIAGNOSIS — R001 Bradycardia, unspecified: Secondary | ICD-10-CM | POA: Diagnosis present

## 2024-08-05 DIAGNOSIS — Z7984 Long term (current) use of oral hypoglycemic drugs: Secondary | ICD-10-CM

## 2024-08-05 DIAGNOSIS — E1122 Type 2 diabetes mellitus with diabetic chronic kidney disease: Secondary | ICD-10-CM | POA: Diagnosis present

## 2024-08-05 DIAGNOSIS — Z79890 Hormone replacement therapy (postmenopausal): Secondary | ICD-10-CM

## 2024-08-05 DIAGNOSIS — R339 Retention of urine, unspecified: Secondary | ICD-10-CM | POA: Diagnosis not present

## 2024-08-05 DIAGNOSIS — K219 Gastro-esophageal reflux disease without esophagitis: Secondary | ICD-10-CM | POA: Diagnosis present

## 2024-08-05 LAB — BASIC METABOLIC PANEL WITH GFR
Anion gap: 12 (ref 5–15)
BUN: 46 mg/dL — ABNORMAL HIGH (ref 8–23)
CO2: 25 mmol/L (ref 22–32)
Calcium: 9.9 mg/dL (ref 8.9–10.3)
Chloride: 98 mmol/L (ref 98–111)
Creatinine, Ser: 3.47 mg/dL — ABNORMAL HIGH (ref 0.61–1.24)
GFR, Estimated: 16 mL/min — ABNORMAL LOW (ref >60)
Glucose, Bld: 177 mg/dL — ABNORMAL HIGH (ref 70–99)
Potassium: 5.3 mmol/L — ABNORMAL HIGH (ref 3.5–5.1)
Sodium: 135 mmol/L (ref 135–145)

## 2024-08-05 LAB — CBC
HCT: 37.3 % — ABNORMAL LOW (ref 39.0–52.0)
Hemoglobin: 12.2 g/dL — ABNORMAL LOW (ref 13.0–17.0)
MCH: 29.8 pg (ref 26.0–34.0)
MCHC: 32.7 g/dL (ref 30.0–36.0)
MCV: 91.2 fL (ref 80.0–100.0)
Platelets: 158 K/uL (ref 150–400)
RBC: 4.09 MIL/uL — ABNORMAL LOW (ref 4.22–5.81)
RDW: 15.1 % (ref 11.5–15.5)
WBC: 14.8 K/uL — ABNORMAL HIGH (ref 4.0–10.5)
nRBC: 0 % (ref 0.0–0.2)

## 2024-08-05 LAB — HEPATIC FUNCTION PANEL
ALT: 28 U/L (ref 0–44)
AST: 65 U/L — ABNORMAL HIGH (ref 15–41)
Albumin: 3.2 g/dL — ABNORMAL LOW (ref 3.5–5.0)
Alkaline Phosphatase: 35 U/L — ABNORMAL LOW (ref 38–126)
Bilirubin, Direct: 0.2 mg/dL (ref 0.0–0.2)
Indirect Bilirubin: 0.4 mg/dL (ref 0.3–0.9)
Total Bilirubin: 0.6 mg/dL (ref 0.0–1.2)
Total Protein: 7.1 g/dL (ref 6.5–8.1)

## 2024-08-05 LAB — TSH: TSH: 1.137 u[IU]/mL (ref 0.350–4.500)

## 2024-08-05 LAB — CK: Total CK: 375 U/L (ref 49–397)

## 2024-08-05 LAB — HEMOGLOBIN A1C
Hgb A1c MFr Bld: 7.1 % — ABNORMAL HIGH (ref 4.8–5.6)
Mean Plasma Glucose: 157.07 mg/dL

## 2024-08-05 LAB — TROPONIN I (HIGH SENSITIVITY)
Troponin I (High Sensitivity): 22494 ng/L (ref <18)
Troponin I (High Sensitivity): 16320 ng/L (ref <18)

## 2024-08-05 LAB — MAGNESIUM: Magnesium: 1.9 mg/dL (ref 1.7–2.4)

## 2024-08-05 LAB — CBG MONITORING, ED: Glucose-Capillary: 125 mg/dL — ABNORMAL HIGH (ref 70–99)

## 2024-08-05 MED ORDER — ACETAMINOPHEN 325 MG PO TABS
650.0000 mg | ORAL_TABLET | ORAL | Status: DC | PRN
  Administered 2024-08-06 – 2024-08-19 (×6): 650 mg via ORAL
  Filled 2024-08-05 (×8): qty 2

## 2024-08-05 MED ORDER — DOPAMINE-DEXTROSE 3.2-5 MG/ML-% IV SOLN
2.5000 ug/kg/min | INTRAVENOUS | Status: DC
  Administered 2024-08-05 – 2024-08-07 (×2): 5 ug/kg/min via INTRAVENOUS
  Filled 2024-08-05 (×2): qty 250

## 2024-08-05 MED ORDER — SODIUM CHLORIDE 0.9 % IV SOLN: INTRAVENOUS | Status: AC

## 2024-08-05 MED ORDER — ASPIRIN 81 MG PO CHEW
324.0000 mg | CHEWABLE_TABLET | Freq: Once | ORAL | Status: AC
  Administered 2024-08-05: 324 mg via ORAL
  Filled 2024-08-05: qty 4

## 2024-08-05 MED ORDER — SODIUM CHLORIDE 0.9 % IV BOLUS
250.0000 mL | Freq: Once | INTRAVENOUS | Status: AC
  Administered 2024-08-05: 250 mL via INTRAVENOUS

## 2024-08-05 MED ORDER — SODIUM CHLORIDE 0.9 % IV BOLUS
500.0000 mL | Freq: Once | INTRAVENOUS | Status: AC
  Administered 2024-08-05: 500 mL via INTRAVENOUS

## 2024-08-05 MED ORDER — PANTOPRAZOLE SODIUM 40 MG PO TBEC
40.0000 mg | DELAYED_RELEASE_TABLET | Freq: Every day | ORAL | Status: DC
  Administered 2024-08-05 – 2024-08-23 (×19): 40 mg via ORAL
  Filled 2024-08-05 (×19): qty 1

## 2024-08-05 MED ORDER — SODIUM ZIRCONIUM CYCLOSILICATE 5 G PO PACK
5.0000 g | PACK | ORAL | Status: DC
Start: 2024-08-05 — End: 2024-08-06
  Administered 2024-08-05: 5 g via ORAL
  Filled 2024-08-05: qty 1

## 2024-08-05 MED ORDER — ONDANSETRON HCL 4 MG/2ML IJ SOLN
4.0000 mg | Freq: Four times a day (QID) | INTRAMUSCULAR | Status: DC | PRN
  Administered 2024-08-06 – 2024-08-08 (×3): 4 mg via INTRAVENOUS
  Filled 2024-08-05 (×3): qty 2

## 2024-08-05 MED ORDER — CLOPIDOGREL BISULFATE 300 MG PO TABS
300.0000 mg | ORAL_TABLET | Freq: Once | ORAL | Status: AC
  Administered 2024-08-05: 300 mg via ORAL
  Filled 2024-08-05: qty 1

## 2024-08-05 MED ORDER — HEPARIN (PORCINE) 25000 UT/250ML-% IV SOLN
1300.0000 [IU]/h | INTRAVENOUS | Status: AC
  Administered 2024-08-05: 950 [IU]/h via INTRAVENOUS
  Administered 2024-08-06: 1200 [IU]/h via INTRAVENOUS
  Administered 2024-08-07: 1300 [IU]/h via INTRAVENOUS
  Filled 2024-08-05 (×3): qty 250

## 2024-08-05 MED ORDER — HEPARIN BOLUS VIA INFUSION
4000.0000 [IU] | Freq: Once | INTRAVENOUS | Status: AC
  Administered 2024-08-05: 4000 [IU] via INTRAVENOUS
  Filled 2024-08-05: qty 4000

## 2024-08-05 MED ORDER — SODIUM CHLORIDE 0.9 % IV SOLN: 250.0000 mL | INTRAVENOUS | Status: DC

## 2024-08-05 MED ORDER — INSULIN ASPART 100 UNIT/ML IJ SOLN
0.0000 [IU] | Freq: Three times a day (TID) | INTRAMUSCULAR | Status: DC
  Administered 2024-08-06: 3 [IU] via SUBCUTANEOUS
  Administered 2024-08-06 (×2): 2 [IU] via SUBCUTANEOUS
  Administered 2024-08-07: 7 [IU] via SUBCUTANEOUS
  Administered 2024-08-07: 5 [IU] via SUBCUTANEOUS
  Administered 2024-08-08 (×2): 2 [IU] via SUBCUTANEOUS
  Administered 2024-08-08: 3 [IU] via SUBCUTANEOUS
  Administered 2024-08-09 (×2): 5 [IU] via SUBCUTANEOUS
  Administered 2024-08-09 – 2024-08-10 (×2): 2 [IU] via SUBCUTANEOUS
  Filled 2024-08-05: qty 5
  Filled 2024-08-05: qty 2

## 2024-08-05 MED ORDER — ASPIRIN 81 MG PO TBEC
81.0000 mg | DELAYED_RELEASE_TABLET | Freq: Every day | ORAL | Status: DC
  Administered 2024-08-06 – 2024-08-23 (×18): 81 mg via ORAL
  Filled 2024-08-05 (×18): qty 1

## 2024-08-05 MED ORDER — FENTANYL CITRATE (PF) 50 MCG/ML IJ SOSY
25.0000 ug | PREFILLED_SYRINGE | INTRAMUSCULAR | Status: DC
  Administered 2024-08-05 – 2024-08-06 (×2): 25 ug via INTRAVENOUS
  Filled 2024-08-05 (×2): qty 1

## 2024-08-05 MED ORDER — ATORVASTATIN CALCIUM 80 MG PO TABS
80.0000 mg | ORAL_TABLET | Freq: Every day | ORAL | Status: DC
  Administered 2024-08-06 – 2024-08-23 (×18): 80 mg via ORAL
  Filled 2024-08-05 (×18): qty 1

## 2024-08-05 MED ORDER — NOREPINEPHRINE 4 MG/250ML-% IV SOLN
0.0000 ug/min | INTRAVENOUS | Status: DC
  Administered 2024-08-05: 2 ug/min via INTRAVENOUS
  Administered 2024-08-06: 5 ug/min via INTRAVENOUS
  Administered 2024-08-07: 7 ug/min via INTRAVENOUS
  Administered 2024-08-07 – 2024-08-08 (×3): 8 ug/min via INTRAVENOUS
  Filled 2024-08-05 (×6): qty 250

## 2024-08-05 MED ORDER — CLOPIDOGREL BISULFATE 75 MG PO TABS
75.0000 mg | ORAL_TABLET | Freq: Every day | ORAL | Status: DC
Start: 2024-08-06 — End: 2024-08-10
  Administered 2024-08-06 – 2024-08-10 (×5): 75 mg via ORAL
  Filled 2024-08-05 (×5): qty 1

## 2024-08-05 NOTE — ED Notes (Signed)
 Report called to 2H. No needs noted and bed ready for PT

## 2024-08-05 NOTE — ED Triage Notes (Signed)
 Pt reports feeling poorly x4 days. Pt states that he fell while trying to get out of bed this AM. Pt reports generalized weakness and chest tightness x4 days.

## 2024-08-05 NOTE — H&P (Addendum)
 CARDIOLOGY HISTORY AND PHYSICAL    Patient ID: William Wells; [id number]; 10-25-1933   Admit date: 08/05/2024 Date of Consult: 08/05/2024  Primary Care Provider: Shona Norleen PEDLAR, MD Primary Cardiologist:  Primary Electrophysiologist:    History of Present Illness:   William Wells is a 89 year old M known to have lung cancer S/P robotic assisted left upper lobectomy in 2021, HTN, DM 2, CKD stage IIIb presented to the ER with chest pressure.  Patient reported developing exertional chest pressure that lasted for about 30 minutes multiple times throughout the day around 10 days ago.  However around 4 days ago, chest pressure worsened to the point that it is occurring at rest and start medical attention, upon the request of his daughter.  He also reported having fatigue, lightheadedness/dizziness for started around 10 days ago and has been gradually getting worse.  No syncope.  During the interview, he has active chest pain, 4/10 in intensity however if he tries to move in the bed, pain level goes up to 10/10. EKG showed severe sinus bradycardia, subtle ST elevations in the inferior leads and second-degree AV block, 2:1. Hs troponin significantly elevated, 22,494>>16,320. EDP performed bedside echocardiogram that showed normal LVEF. He received 500 mL IVF bolus in the ER.  Past Medical History:  Diagnosis Date   Acid reflux disease    Arthritis    Complication of anesthesia    appendix surgery (89 year old) - passed out once patient got back to floor unit   Diabetes mellitus    Dyspnea    Family history of adverse reaction to anesthesia    daughter states that she has a difficult time waking up   Hiatal hernia    High cholesterol    Hypertension    Hypothyroidism    Lung cancer (HCC)    s/p XI robotic assisted thoracoscopy for LU lobectomy 06/22/20   Monoclonal gammopathy of unknown significance (MGUS) 03/28/2020   Normocytic anemia 03/28/2020   Renal disorder    renal insufficiency    Type 2 diabetes mellitus (HCC)     Past Surgical History:  Procedure Laterality Date   APPENDECTOMY     ARTERY BIOPSY Right 11/23/2023   Procedure: RIGHT BIOPSY TEMPORAL ARTERY;  Surgeon: Gretta Lonni PARAS, MD;  Location: [facility] OR;  Service: Vascular;  Laterality: Right;   BRONCHIAL BIOPSY  05/29/2020   Procedure: BRONCHIAL BIOPSIES;  Surgeon: Shelah Lamar RAMAN, MD;  Location: [facility] ENDOSCOPY;  Service: Pulmonary;;   BRONCHIAL BRUSHINGS  05/29/2020   Procedure: BRONCHIAL BRUSHINGS;  Surgeon: Shelah Lamar RAMAN, MD;  Location: [facility] ENDOSCOPY;  Service: Pulmonary;;   BRONCHIAL NEEDLE ASPIRATION BIOPSY  05/29/2020   Procedure: BRONCHIAL NEEDLE ASPIRATION BIOPSIES;  Surgeon: Shelah Lamar RAMAN, MD;  Location: [facility] ENDOSCOPY;  Service: Pulmonary;;   BRONCHIAL WASHINGS  05/29/2020   Procedure: BRONCHIAL WASHINGS;  Surgeon: Shelah Lamar RAMAN, MD;  Location: [facility] ENDOSCOPY;  Service: Pulmonary;;   CATARACT EXTRACTION W/PHACO Left 01/23/2023   Procedure: CATARACT EXTRACTION PHACO AND INTRAOCULAR LENS PLACEMENT (IOC);  Surgeon: Harrie Lynwood, MD;  Location: [facility] ORS;  Service: Ophthalmology;  Laterality: Left;  CDE: 13.69   CATARACT EXTRACTION W/PHACO Right 02/20/2023   Procedure: CATARACT EXTRACTION PHACO AND INTRAOCULAR LENS PLACEMENT (IOC);  Surgeon: Harrie Lynwood, MD;  Location: [facility] ORS;  Service: Ophthalmology;  Laterality: Right;  CDE: 12.88   CERVICAL SPINE SURGERY     EYE SURGERY     strabismus   INTERCOSTAL NERVE BLOCK Left 06/22/2020   Procedure: INTERCOSTAL NERVE BLOCK;  Surgeon: Kerrin Elspeth BROCKS, MD;  Location: [facility] OR;  Service: Thoracic;  Laterality: Left;   NODE DISSECTION Left 06/22/2020   Procedure: NODE DISSECTION;  Surgeon: Kerrin Elspeth BROCKS, MD;  Location: [facility] OR;  Service: Thoracic;  Laterality: Left;   TONSILLECTOMY     VIDEO BRONCHOSCOPY WITH ENDOBRONCHIAL NAVIGATION N/A 05/29/2020   Procedure: VIDEO BRONCHOSCOPY WITH ENDOBRONCHIAL NAVIGATION;  Surgeon: Shelah Lamar RAMAN, MD;  Location: [facility]  ENDOSCOPY;  Service: Pulmonary;  Laterality: N/A;    Inpatient Medications: Scheduled Meds:  [START ON 08/06/2024] aspirin EC  81 mg Oral Daily   [START ON 08/06/2024] atorvastatin  80 mg Oral Daily   clopidogrel  300 mg Oral Once   Followed by   [START ON 08/06/2024] clopidogrel  75 mg Oral Daily   fentaNYL  (SUBLIMAZE ) injection  25 mcg Intravenous Q4H   [START ON 08/06/2024] insulin  aspart  0-9 Units Subcutaneous TID WC   pantoprazole  40 mg Oral Daily   sodium zirconium cyclosilicate  5 g Oral Q M,W,F   Continuous Infusions:  sodium chloride      sodium chloride      DOPamine     heparin 950 Units/hr (08/05/24 1941)   norepinephrine (LEVOPHED) Adult infusion     PRN Meds: acetaminophen , ondansetron  (ZOFRAN ) IV  Allergies:   No Known Allergies  Social History:   Social History   Socioeconomic History   Marital status: Widowed    Spouse name: Not on file   Number of children: 4   Years of education: Not on file   Highest education level: Not on file  Occupational History   Occupation: RETIRED  Tobacco Use   Smoking status: Former    Current packs/day: 0.00    Average packs/day: 0.5 packs/day for 47.0 years (23.5 ttl pk-yrs)    Types: Cigarettes    Start date: 76    Quit date: 2000    Years since quitting: 25.8   Smokeless tobacco: Never  Vaping Use   Vaping status: Never Used  Substance and Sexual Activity   Alcohol use: No   Drug use: No   Sexual activity: Not Currently  Other Topics Concern   Not on file  Social History Narrative   Not on file   Social Drivers of Health   Financial Resource Strain: Low Risk  (10/25/2020)   Overall Financial Resource Strain (CARDIA)    Difficulty of Paying Living Expenses: Not hard at all  Food Insecurity: No Food Insecurity (04/19/2024)   Hunger Vital Sign    Worried About Running Out of Food in the Last Year: Never true    Ran Out of Food in the Last Year: Never true  Transportation Needs: No Transportation Needs  (04/19/2024)   PRAPARE - Administrator, Civil Service (Medical): No    Lack of Transportation (Non-Medical): No  Physical Activity: Sufficiently Active (10/25/2020)   Exercise Vital Sign    Days of Exercise per Week: 7 days    Minutes of Exercise per Session: 30 min  Stress: No Stress Concern Present (10/25/2020)   [employer] of Occupational Health - Occupational Stress Questionnaire    Feeling of Stress : Only a little  Social Connections: Moderately Integrated (10/25/2020)   Social Connection and Isolation Panel    Frequency of Communication with Friends and Family: More than three times a week    Frequency of Social Gatherings with Friends and Family: More than three times a week    Attends Religious Services: More than 4 times  per year    Active Member of Clubs or Organizations: No    Attends Banker Meetings: Not on file    Marital Status: Married  Intimate Partner Violence: Not At Risk (04/19/2024)   Humiliation, Afraid, Rape, and Kick questionnaire    Fear of Current or Ex-Partner: No    Emotionally Abused: No    Physically Abused: No    Sexually Abused: No    Family History:    Family History  Problem Relation Age of Onset   Diabetes Mother    Cancer Father    Cancer Sister    Diabetes Brother    Cancer Brother    Diabetes Brother    Parkinson's disease Brother    Diabetes Brother      ROS:  Please see the history of present illness.  ROS  All other ROS reviewed and negative.     Physical Exam/Data:   Vitals:   08/05/24 1730 08/05/24 1800 08/05/24 1830 08/05/24 2000  BP: (!) 101/49 (!) 90/51 (!) 91/46 (!) 96/46  Pulse: (!) 43 (!) 42 (!) 42 (!) 41  Resp: 16 14 (!) 21 12  Temp:      TempSrc:      SpO2: 95% 98% 100% 100%  Weight:      Height:        Intake/Output Summary (Last 24 hours) at 08/05/2024 2033 Last data filed at 08/05/2024 2019 Gross per 24 hour  Intake 1000 ml  Output --  Net 1000 ml   Filed Weights    08/05/24 1535  Weight: 80.7 kg   Body mass index is 27.06 kg/m.  General:  Well nourished, well developed, in no acute distress HEENT: normal Lymph: no adenopathy Neck: no JVD Endocrine:  No thryomegaly Vascular: No carotid bruits; FA pulses 2+ bilaterally without bruits  Cardiac:  normal S1, S2; RRR; no murmur  Lungs:  clear to auscultation bilaterally, no wheezing, rhonchi or rales  Abd: soft, nontender, no hepatomegaly  Ext: no edema Musculoskeletal:  No deformities, BUE and BLE strength normal and equal Skin: warm and dry  Neuro:  CNs 2-12 intact, no focal abnormalities noted Psych:  Normal affect   Laboratory Data:  Chemistry Recent Labs  Lab 08/05/24 1543  NA 135  K 5.3*  CL 98  CO2 25  GLUCOSE 177*  BUN 46*  CREATININE 3.47*  CALCIUM  9.9  GFRNONAA 16*  ANIONGAP 12    Recent Labs  Lab 08/05/24 1741  PROT 7.1  ALBUMIN  3.2*  AST 65*  ALT 28  ALKPHOS 35*  BILITOT 0.6   Hematology Recent Labs  Lab 08/05/24 1543  WBC 14.8*  RBC 4.09*  HGB 12.2*  HCT 37.3*  MCV 91.2  MCH 29.8  MCHC 32.7  RDW 15.1  PLT 158   Cardiac EnzymesNo results for input(s): TROPONINI in the last 168 hours. No results for input(s): TROPIPOC in the last 168 hours.  BNPNo results for input(s): BNP, PROBNP in the last 168 hours.  DDimer No results for input(s): DDIMER in the last 168 hours.  Radiology/Studies:  CT Cervical Spine Wo Contrast Result Date: 08/05/2024 CLINICAL DATA:  Clemens out of bed, weakness EXAM: CT CERVICAL SPINE WITHOUT CONTRAST TECHNIQUE: Multidetector CT imaging of the cervical spine was performed without intravenous contrast. Multiplanar CT image reconstructions were also generated. RADIATION DOSE REDUCTION: This exam was performed according to the departmental dose-optimization program which includes automated exposure control, adjustment of the mA and/or kV according to patient size and/or use  of iterative reconstruction technique. COMPARISON:   04/05/2020 FINDINGS: Alignment: Mild degenerative anterolisthesis of C6 on C7. Skull base and vertebrae: No acute fracture. No primary bone lesion or focal pathologic process. Soft tissues and spinal canal: No prevertebral fluid or swelling. No visible canal hematoma. Disc levels: Previous laminectomy spanning C3 through C6. There is bony fusion across the C3-4 and C4-5 disc spaces. There is severe spondylosis at C5-6 and C6-7. Upper chest: Airway is patent. Emphysematous changes are seen at the lung apices. Other: Reconstructed images demonstrate no additional findings. IMPRESSION: 1. No acute cervical spine fracture. 2. Multilevel postsurgical and degenerative changes as above. Electronically Signed   By: Ozell Daring M.D.   On: 08/05/2024 19:44   CT CHEST ABDOMEN PELVIS WO CONTRAST Result Date: 08/05/2024 CLINICAL DATA:  Chest trauma fall left lower quadrant pain EXAM: CT CHEST, ABDOMEN AND PELVIS WITHOUT CONTRAST TECHNIQUE: Multidetector CT imaging of the chest, abdomen and pelvis was performed following the standard protocol without IV contrast. RADIATION DOSE REDUCTION: This exam was performed according to the departmental dose-optimization program which includes automated exposure control, adjustment of the mA and/or kV according to patient size and/or use of iterative reconstruction technique. COMPARISON:  CT 03/03/2024, 12/02/2023, PET CT 04/09/2020 FINDINGS: CT CHEST FINDINGS Cardiovascular: Limited assessment without intravenous contrast. Advanced aortic atherosclerosis without aneurysm. Multi vessel coronary vascular calcification. Normal cardiac size. No pericardial effusion Mediastinum/Nodes: Patent trachea. No thyroid  mass. No suspicious lymph nodes. Esophagus shows small hiatal hernia Lungs/Pleura: Advanced emphysema. Status post left upper lobectomy. No acute airspace disease. Volume loss left hemithorax. Small chronic thick-walled pleural collection. Questionable nodular slightly dense area  within the left pleural collection measuring 12 mm but there is artifact. Musculoskeletal: Sternum appears intact. There are degenerative changes. No acute osseous abnormality CT ABDOMEN PELVIS FINDINGS Hepatobiliary: No focal liver abnormality is seen. No gallstones, gallbladder wall thickening, or biliary dilatation. Pancreas: Unremarkable. No pancreatic ductal dilatation or surrounding inflammatory changes. Spleen: Normal in size without focal abnormality. Adrenals/Urinary Tract: Adrenal glands are normal. No hydronephrosis. Renal cysts for which no imaging follow-up is recommended. Slightly thick-walled appearance of the urinary bladder. Stomach/Bowel: Decompressed stomach. Gastric wall thickening up to 4.3 cm. No dilated small bowel. 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 Vascular/Lymphatic: Aortic atherosclerosis. No enlarged abdominal or pelvic lymph nodes. Reproductive: Enlarged prostate Other: Negative for pelvic effusion or free air. Small fat containing right inguinal hernia Musculoskeletal: Multilevel degenerative changes. No acute osseous abnormality. IMPRESSION: 1. No CT evidence for acute intrathoracic, intra-abdominal, or intrapelvic abnormality allowing for absence of contrast. 2. Status post left upper lobectomy. Small chronic thick-walled left pleural collection. Questionable nodular slightly dense area within the left pleural collection measuring 12 mm but there is artifact. Short-term follow-up chest CT with contrast is suggested to ascertain if this finding persists. 3. Advanced emphysema. 4. Diffuse gastric wall thickening up to 4.3 cm maximum, recommend correlation with endoscopy. 5. 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 6. Aortic atherosclerosis. Aortic Atherosclerosis (ICD10-I70.0) and Emphysema (ICD10-J43.9). Electronically Signed   By: Luke Bun M.D.   On: 08/05/2024 18:47   CT Head Wo Contrast Result Date: 08/05/2024 EXAM: CT HEAD  WITHOUT CONTRAST 08/05/2024 06:22:00 PM TECHNIQUE: CT of the head was performed without the administration of intravenous contrast. Automated exposure control, iterative reconstruction, and/or weight based adjustment of the mA/kV was utilized to reduce the radiation dose to as low as reasonably achievable. COMPARISON: 09/06/2021 CLINICAL HISTORY: Polytrauma, blunt. FINDINGS: BRAIN AND VENTRICLES: Mild age-related volume loss.  No acute hemorrhage. No evidence of acute infarct. No hydrocephalus. No extra-axial collection. No mass effect or midline shift. ORBITS: No acute abnormality. SINUSES: No acute abnormality. SOFT TISSUES AND SKULL: No acute soft tissue abnormality. No skull fracture. IMPRESSION: 1. No acute intracranial abnormality. Electronically signed by: Franky Crease MD 08/05/2024 06:26 PM EDT RP Workstation: [id number]   DG Chest Portable 1 View Result Date: 08/05/2024 EXAM: 1 VIEW(S) XRAY OF THE CHEST 08/05/2024 04:32:00 PM COMPARISON: Comparison 06/18/2021. CLINICAL HISTORY: infectious workup FINDINGS: LUNGS AND PLEURA: Stable postoperative changes are noted in the left lung base. No focal pulmonary opacity. No pulmonary edema. No pleural effusion. No pneumothorax. HEART AND MEDIASTINUM: Stable mediastinal shift to the left. No acute abnormality of the cardiac silhouette. BONES AND SOFT TISSUES: No acute osseous abnormality. IMPRESSION: 1. Stable postoperative changes in the left lung base with persistent mediastinal shift to the left. 2. No acute cardiopulmonary abnormality identified. Electronically signed by: Lynwood Seip MD 08/05/2024 04:56 PM EDT RP Workstation: [id number]    Assessment and Plan:   Inferior STEMI with second-degree AV block 2:1 c/w cardiogenic shock - Presented with exertional chest pressure, fatigue and lightheadedness that started 10 days ago and worsened around 4 days ago where it was occurring at rest as welL. - EKG showed severe sinus bradycardia, subtle ST elevations  in the inferior leads, second-degree AV block 2:1 - Hs troponin significantly elevated, 22,494>>16,320. - Vitals remarkable for BP 80 to 90 mmHg SBP and HR 42 bpm. - Labs remarkable for AKI, serum creatinine 2.47 (baseline 1.9 around 3 months ago). Reported decreased p.o. intake for the last few days. - Start ACS protocol, aspirin load followed by aspirin 81 mg once daily, Plavix load followed by Plavix 75 mg once daily, high intensity statin. Start heparin drip.  He has active chest pain, will administer fentanyl . Avoid nitroglycerin in inferior STEMI. - I discussed case with interventional cardiology, Dr. Wendel.  With serum creatinine 3.47, he is at an elevated risk of hemodialysis if he were to undergo LHC and PCI.  Due to advanced age, not a candidate for dialysis.  Will treat him medically with ACS protocol.  Continue heparin drip for a total duration of 3 to 5 days.  I had an extensive lengthy discussion with the patient and the daughter at the bedside about his treatment plan. - Bedside echo per EDP showed normal LV function.  Received 500 mL bolus in the ER.  Okay to receive 250 mL bolus followed by maintenance IV fluids. - Start norepinephrine and dopamine drips.  Hold home antihypertensive medications.  He will need temporary pacemaker due to high-grade AV block.  Patient and daughter agreeable.  Keep NPO. Agreed to be full code for the procedure. Originally DNR/DNI. - Obtain echocardiogram.  Informed consent for temporary pacemaker Risks and benefits of the procedure were explained to the patient and the daughter at the bedside.  Risks include, but not limited to, bleeding, infection, heart or vessel perforation, pericardial tamponade.  Pneumothorax, lead dislodgment, arrhythmia.  Patient and daughter comprehended these risks and agreed to proceed with the procedure.  AKI: Serum creatinine 3.4 on admission.  Baseline 1.9 around 3 months ago.  Patient received 500 mL bolus in the ER.  Okay  to receive another 250 mL bolus.  Start maintenance IV fluids at 75 cc/h.  DM 2: Insulin  aspart TID.  CODE STATUS: DNR/DNI (he agreed to be full cod for the temp wire procedure)  For questions or updates, please contact [facility]  Please consult [email or web address] for contact info under Cardiology/STEMI.   Signed, Pheonix Wisby Priya Spike Desilets, MD 08/05/2024 8:33 PM

## 2024-08-05 NOTE — Treatment Plan (Addendum)
 Patient has evidence of 2:1 AV block with features consistent with [place]. His chest pain his improved (4/10). We will continue with medical management and defer TVP for now, but will have a low threshold to activate the cath lab for TVP if his conduction disease worsens. Discussed case with Dr. Wendel, who agrees with plan. Patient and the patient's family agrees with the plan.   William Bibbee, MD

## 2024-08-05 NOTE — ED Notes (Signed)
 Patient transported to CT

## 2024-08-05 NOTE — ED Notes (Signed)
EDP notified of hypotension 

## 2024-08-05 NOTE — ED Provider Notes (Signed)
 [place] [facility] DEPARTMENT AT [facility] Provider Note   CSN: [id number] Arrival date & time: 08/05/24  1529     Patient presents with: Fall, Chest Pain, and Weakness   William Wells is a 89 y.o. male.    Fall Associated symptoms include chest pain.  Chest Pain Associated symptoms: weakness   Weakness Associated symptoms: chest pain      Patient presents because of weakness, chest pain and fatigue.  Patient states for the past 4 days, is been having nonspecific chest pain.  Midsternal in nature.  Chest tightness.  Not exertional in nature.  Happen randomly.  No pleuritic chest pain or hemoptysis.  No history of DVT or PE.  Patient states he is also feeling generalized fatigue and weakness.  Patient states he tried to get up today and subsequently legs gave out from underneath him and subsequently fell.  Does not think he hit his head.  No new cervical thoracic or lumbar pain that he endorses.  No new numbness or tingling.  No bowel bladder incontinence.  No saddle anesthesia.  Denies anticoagulation use.  Endorses 1 episode of diarrhea today.  Some pain in his abdomen.  Pain is mostly left lower quadrant.  No bright red blood per rectum or melena.   Previous medical history reviewed : Patient was last seen in the ED on May 20, 2024.  Seen because of leg pain.  Unremarkable workup at that time.  Last followed up with cardiology in September 2025.known to have CKD stage IIIb, HTN, HLD, lung cancer s/p lobectomy in 2021, DM 2 echo December 22 4 showed normal left ventricular ejection fraction.  RV function.  Nuclear study in April 2025 showed findings consistent with prior inferior/inferior septal infarction was mild.  Infarct ischemia.      Prior to Admission medications   Medication Sig Start Date End Date Taking? Authorizing Provider  acetaminophen  (TYLENOL ) 500 MG tablet Take 2 tablets (1,000 mg total) by mouth every 6 (six) hours. 06/26/20   Lucien Orren SAILOR, PA-C   albuterol  (VENTOLIN  HFA) 108 (90 Base) MCG/ACT inhaler INAHLE 2 PUFFS BY MOUTH 4 TIMES A DAY AS NEEDED FOR COUGH/WHEEZING    [provider]  diclofenac Sodium (VOLTAREN) 1 % GEL Apply 2 g topically daily as needed (pain).    [provider]  docusate sodium  (COLACE) 100 MG capsule Take 1 capsule (100 mg total) by mouth 2 (two) times daily as needed for mild constipation. Patient taking differently: Take 100 mg by mouth 2 (two) times a week. 06/26/20   Conte, Tessa N, PA-C  ferrous sulfate 325 (65 FE) MG tablet Take 325 mg by mouth 2 (two) times a week.    [provider]  gabapentin  (NEURONTIN ) 300 MG capsule Take 300 mg by mouth 3 (three) times daily. 12/13/21   [provider]  glipiZIDE  (GLUCOTROL  XL) 2.5 MG 24 hr tablet Take 2.5 mg by mouth daily as needed (blood sugar above 160).  03/20/20   [provider]  HYDROcodone -acetaminophen  (NORCO) 7.5-325 MG tablet Take 1 tablet by mouth every 6 (six) hours as needed for moderate pain (pain score 4-6). 06/08/24   Jerri Kay HERO, MD  Lancets [facility] DELICA PLUS LANCET30G) MISC 2 (two) times daily. 03/27/20   [provider]  levothyroxine  (SYNTHROID ) 75 MCG tablet Take 75 mcg by mouth daily before breakfast. 05/23/21   [provider]  LOKELMA 5 g packet Take 5 g by mouth every Monday, Wednesday, and Friday.  10/08/20   [provider]  loratadine (CLARITIN) 10 MG tablet Take 1 tablet by mouth daily.    [provider]  Multiple Vitamin (MULTIVITAMIN ADULT PO) Take 1 tablet by mouth daily.    [provider]  Multiple Vitamins-Minerals (PRESERVISION AREDS PO) Take 1 tablet by mouth in the morning and at bedtime.    [provider]  mupirocin ointment (BACTROBAN) 2 % Apply 1 Application topically 3 (three) times daily. 06/30/24   [provider]  omeprazole (PRILOSEC) 20 MG capsule Take 20 mg by mouth daily.    [provider]  [facility] ULTRA  test strip 2 (two) times daily. 04/06/20   [provider]  rosuvastatin  (CRESTOR ) 40 MG tablet Take 40 mg by mouth daily.     [provider]  telmisartan (MICARDIS) 20 MG tablet Take 10 mg by mouth daily. 04/08/23   [provider]  VITAMIN D  PO Take 2,000 Units by mouth daily.    [provider]  VITAMIN E PO Take 1 capsule by mouth daily.    [provider]    Allergies: Patient has no known allergies.    Review of Systems  Cardiovascular:  Positive for chest pain.  Neurological:  Positive for weakness.    Updated Vital Signs BP (!) 96/46   Pulse (!) 41   Temp 98.6 F (37 C) (Oral)   Resp 12   Ht 5' 8 (1.727 m)   Wt 80.7 kg   SpO2 100%   BMI 27.06 kg/m   Physical Exam Vitals and nursing note reviewed.  Constitutional:      General: He is not in acute distress.    Appearance: He is well-developed.  HENT:     Head: Normocephalic and atraumatic.  Eyes:     Conjunctiva/sclera: Conjunctivae normal.  Cardiovascular:     Rate and Rhythm: Normal rate and regular rhythm.     Heart sounds: No murmur heard. Pulmonary:     Effort: Pulmonary effort is normal. No respiratory distress.     Breath sounds: Normal breath sounds.  Abdominal:     Palpations: Abdomen is soft.     Tenderness: There is no abdominal tenderness.  Musculoskeletal:        General: No swelling.     Cervical back: Neck supple.  Skin:    General: Skin is warm and dry.     Capillary Refill: Capillary refill takes less than 2 seconds.  Neurological:     Mental Status: He is alert.  Psychiatric:        Mood and Affect: Mood normal.     (all labs ordered are listed, but only abnormal results are displayed) Labs Reviewed  BASIC METABOLIC PANEL WITH GFR - Abnormal; Notable for the following components:      Result Value   Potassium 5.3 (*)    Glucose, Bld 177 (*)    BUN 46 (*)    Creatinine, Ser 3.47 (*)    GFR, Estimated 16 (*)    All other components  within normal limits  CBC - Abnormal; Notable for the following components:   WBC 14.8 (*)    RBC 4.09 (*)    Hemoglobin 12.2 (*)    HCT 37.3 (*)    All other components within normal limits  HEPATIC FUNCTION PANEL - Abnormal; Notable for the following components:   Albumin  3.2 (*)    AST 65 (*)    Alkaline Phosphatase 35 (*)    All other  components within normal limits  TROPONIN I (HIGH SENSITIVITY) - Abnormal; Notable for the following components:   Troponin I (High Sensitivity) 22,494 (*)    All other components within normal limits  TROPONIN I (HIGH SENSITIVITY) - Abnormal; Notable for the following components:   Troponin I (High Sensitivity) 16,320 (*)    All other components within normal limits  TSH  MAGNESIUM  URINALYSIS, ROUTINE W REFLEX MICROSCOPIC  CK  HEPARIN LEVEL (UNFRACTIONATED)  LIPOPROTEIN A (LPA)  LIPID PANEL  CBC  COMPREHENSIVE METABOLIC PANEL WITH GFR  HEMOGLOBIN A1C    EKG:  Radiology: CT Cervical Spine Wo Contrast Result Date: 08/05/2024 CLINICAL DATA:  Clemens out of bed, weakness EXAM: CT CERVICAL SPINE WITHOUT CONTRAST TECHNIQUE: Multidetector CT imaging of the cervical spine was performed without intravenous contrast. Multiplanar CT image reconstructions were also generated. RADIATION DOSE REDUCTION: This exam was performed according to the departmental dose-optimization program which includes automated exposure control, adjustment of the mA and/or kV according to patient size and/or use of iterative reconstruction technique. COMPARISON:  04/05/2020 FINDINGS: Alignment: Mild degenerative anterolisthesis of C6 on C7. Skull base and vertebrae: No acute fracture. No primary bone lesion or focal pathologic process. Soft tissues and spinal canal: No prevertebral fluid or swelling. No visible canal hematoma. Disc levels: Previous laminectomy spanning C3 through C6. There is bony fusion across the C3-4 and C4-5 disc spaces. There is severe spondylosis at C5-6 and  C6-7. Upper chest: Airway is patent. Emphysematous changes are seen at the lung apices. Other: Reconstructed images demonstrate no additional findings. IMPRESSION: 1. No acute cervical spine fracture. 2. Multilevel postsurgical and degenerative changes as above. Electronically Signed   By: Ozell Daring M.D.   On: 08/05/2024 19:44   CT CHEST ABDOMEN PELVIS WO CONTRAST Result Date: 08/05/2024 CLINICAL DATA:  Chest trauma fall left lower quadrant pain EXAM: CT CHEST, ABDOMEN AND PELVIS WITHOUT CONTRAST TECHNIQUE: Multidetector CT imaging of the chest, abdomen and pelvis was performed following the standard protocol without IV contrast. RADIATION DOSE REDUCTION: This exam was performed according to the departmental dose-optimization program which includes automated exposure control, adjustment of the mA and/or kV according to patient size and/or use of iterative reconstruction technique. COMPARISON:  CT 03/03/2024, 12/02/2023, PET CT 04/09/2020 FINDINGS: CT CHEST FINDINGS Cardiovascular: Limited assessment without intravenous contrast. Advanced aortic atherosclerosis without aneurysm. Multi vessel coronary vascular calcification. Normal cardiac size. No pericardial effusion Mediastinum/Nodes: Patent trachea. No thyroid  mass. No suspicious lymph nodes. Esophagus shows small hiatal hernia Lungs/Pleura: Advanced emphysema. Status post left upper lobectomy. No acute airspace disease. Volume loss left hemithorax. Small chronic thick-walled pleural collection. Questionable nodular slightly dense area within the left pleural collection measuring 12 mm but there is artifact. Musculoskeletal: Sternum appears intact. There are degenerative changes. No acute osseous abnormality CT ABDOMEN PELVIS FINDINGS Hepatobiliary: No focal liver abnormality is seen. No gallstones, gallbladder wall thickening, or biliary dilatation. Pancreas: Unremarkable. No pancreatic ductal dilatation or surrounding inflammatory changes. Spleen:  Normal in size without focal abnormality. Adrenals/Urinary Tract: Adrenal glands are normal. No hydronephrosis. Renal cysts for which no imaging follow-up is recommended. Slightly thick-walled appearance of the urinary bladder. Stomach/Bowel: Decompressed stomach. Gastric wall thickening up to 4.3 cm. No dilated small bowel. 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 Vascular/Lymphatic: Aortic atherosclerosis. No enlarged abdominal or pelvic lymph nodes. Reproductive: Enlarged prostate Other: Negative for pelvic effusion or free air. Small fat containing right inguinal hernia Musculoskeletal: Multilevel degenerative changes. No acute osseous abnormality. IMPRESSION: 1. No CT evidence  for acute intrathoracic, intra-abdominal, or intrapelvic abnormality allowing for absence of contrast. 2. Status post left upper lobectomy. Small chronic thick-walled left pleural collection. Questionable nodular slightly dense area within the left pleural collection measuring 12 mm but there is artifact. Short-term follow-up chest CT with contrast is suggested to ascertain if this finding persists. 3. Advanced emphysema. 4. Diffuse gastric wall thickening up to 4.3 cm maximum, recommend correlation with endoscopy. 5. 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 6. Aortic atherosclerosis. Aortic Atherosclerosis (ICD10-I70.0) and Emphysema (ICD10-J43.9). Electronically Signed   By: Luke Bun M.D.   On: 08/05/2024 18:47   CT Head Wo Contrast Result Date: 08/05/2024 EXAM: CT HEAD WITHOUT CONTRAST 08/05/2024 06:22:00 PM TECHNIQUE: CT of the head was performed without the administration of intravenous contrast. Automated exposure control, iterative reconstruction, and/or weight based adjustment of the mA/kV was utilized to reduce the radiation dose to as low as reasonably achievable. COMPARISON: 09/06/2021 CLINICAL HISTORY: Polytrauma, blunt. FINDINGS: BRAIN AND VENTRICLES: Mild age-related volume  loss. No acute hemorrhage. No evidence of acute infarct. No hydrocephalus. No extra-axial collection. No mass effect or midline shift. ORBITS: No acute abnormality. SINUSES: No acute abnormality. SOFT TISSUES AND SKULL: No acute soft tissue abnormality. No skull fracture. IMPRESSION: 1. No acute intracranial abnormality. Electronically signed by: Franky Crease MD 08/05/2024 06:26 PM EDT RP Workstation: [id number]   DG Chest Portable 1 View Result Date: 08/05/2024 EXAM: 1 VIEW(S) XRAY OF THE CHEST 08/05/2024 04:32:00 PM COMPARISON: Comparison 06/18/2021. CLINICAL HISTORY: infectious workup FINDINGS: LUNGS AND PLEURA: Stable postoperative changes are noted in the left lung base. No focal pulmonary opacity. No pulmonary edema. No pleural effusion. No pneumothorax. HEART AND MEDIASTINUM: Stable mediastinal shift to the left. No acute abnormality of the cardiac silhouette. BONES AND SOFT TISSUES: No acute osseous abnormality. IMPRESSION: 1. Stable postoperative changes in the left lung base with persistent mediastinal shift to the left. 2. No acute cardiopulmonary abnormality identified. Electronically signed by: Lynwood Seip MD 08/05/2024 04:56 PM EDT RP Workstation: [id number]     Procedures   Medications Ordered in the ED  heparin ADULT infusion 100 units/mL (25000 units/250mL) (950 Units/hr Intravenous New Bag/Given 08/05/24 1941)  aspirin EC tablet 81 mg (has no administration in time range)  clopidogrel (PLAVIX) tablet 300 mg (has no administration in time range)    Followed by  clopidogrel (PLAVIX) tablet 75 mg (has no administration in time range)  atorvastatin (LIPITOR) tablet 80 mg (has no administration in time range)  fentaNYL  (SUBLIMAZE ) injection 25 mcg (has no administration in time range)  0.9 %  sodium chloride  infusion (has no administration in time range)  norepinephrine (LEVOPHED) 4mg  in (0.016 mg/mL) premix infusion (has no administration in time range)  DOPamine (INTROPIN) 800  mg in dextrose  5 % 250 mL (3.2 mg/mL) infusion (has no administration in time range)  sodium zirconium cyclosilicate (LOKELMA) packet 5 g (has no administration in time range)  pantoprazole (PROTONIX) EC tablet 40 mg (has no administration in time range)  0.9 %  sodium chloride  infusion (has no administration in time range)  acetaminophen  (TYLENOL ) tablet 650 mg (has no administration in time range)  ondansetron  (ZOFRAN ) injection 4 mg (has no administration in time range)  insulin  aspart (novoLOG ) injection 0-9 Units (has no administration in time range)  sodium chloride  0.9 % bolus 500 mL (0 mLs Intravenous Stopped 08/05/24 1755)  aspirin chewable tablet 324 mg (324 mg Oral Given 08/05/24 1716)  sodium chloride  0.9 % bolus 500 mL (0 mLs  Intravenous Stopped 08/05/24 2019)  heparin bolus via infusion 4,000 Units (4,000 Units Intravenous Bolus from Bag 08/05/24 1941)                                    Medical Decision Making Amount and/or Complexity of Data Reviewed Labs: ordered. Radiology: ordered.  Risk OTC drugs. Prescription drug management.     HPI:   Patient presents because of weakness, chest pain and fatigue.  Patient states for the past 4 days, is been having nonspecific chest pain.  Midsternal in nature.  Chest tightness.  Not exertional in nature.  Happen randomly.  No pleuritic chest pain or hemoptysis.  No history of DVT or PE.  Patient states he is also feeling generalized fatigue and weakness.  Patient states he tried to get up today and subsequently legs gave out from underneath him and subsequently fell.  Does not think he hit his head.  No new cervical thoracic or lumbar pain that he endorses.  No new numbness or tingling.  No bowel bladder incontinence.  No saddle anesthesia.  Denies anticoagulation use.  Endorses 1 episode of diarrhea today.  Some pain in his abdomen.  Pain is mostly left lower quadrant.  No bright red blood per rectum or melena.   Previous  medical history reviewed : Patient was last seen in the ED on May 20, 2024.  Seen because of leg pain.  Unremarkable workup at that time.  Last followed up with cardiology in September 2025.known to have CKD stage IIIb, HTN, HLD, lung cancer s/p lobectomy in 2021, DM 2 echo December 22 4 showed normal left ventricular ejection fraction.  RV function.  Nuclear study in April 2025 showed findings consistent with prior inferior/inferior septal infarction was mild.  Infarct ischemia.  MDM:   , Patient ANO x 3 with GCS 15.  No focal deficits are appreciated.  No concerns for acute CVA at this time.  Will obtain CT head as well as CT cervical spine given concern for blunt trauma.  Will eval for subdural epidural.  No new thoracic or lumbar pain to palpation.  No occasion obtain thoracic or lumbar imaging.  Plan obtain an CT scan of the abdomen as well.  Pain to palpation left lower quadrant with diarrhea.  Will eval for diverticulitis especially with leukocytosis seen on labs.  Will add on LFTs, 90 cm and TSH to patient's labs.  Will obtain UA as well for infectious workup.  Chest x-ray for infectious workup.  Will need ACS workup.  EKG showed no evidence of significant STEMI.  Maybe some minimal elevation of the inferior leads but no obvious reciprocal changes.  Will obtain troponin for further ACS workup.   No risk factors for DVT or PE.  No clear chest pain or hemoptysis.  Reevaluation:   Upon reexamination, patient hemodynamically stable.  Remains A&O x 3 with GCS 15.  Initial troponin 22,000.  Started patient on aspirin 324 mg.  Do not want to immediately start patient on heparin bolus and drip given still pending CT imaging.  Wanted to rule out acute intracranial pathology for the patient's fall.  Therefore, asked the nurse to immediately take the patient over the CT scanner so that way we can start patient on anticoagulation given this elevated troponin.  After CT results, ordered heparin  bolus and drip per pharmacy.  Repeat EKG.  No ongoing changes.  Episodic chest  tightness or chest pain.  Patient also having a 2-1 AV block.  With drop in his heart rate as well as blood pressures.  Heart rate dropped into the 40s.  Maps around [phone].  Mentating well.  Therefore, did a bedside echo.  Look like he had appropriate ejection fraction was around 5055%.  Therefore, ordered 500 cc of fluid for the patient.  Maps responded with above 65.  Cardiology had already been consulted.  Cardiology came by and saw the patient at bedside.  Will admit the patient and likely place a temporary pacemaker in the ICU.  2-1 AV block.  Questionable ST elevation in the inferior leads on initial EKG.  Will not take the patient to cardiac cath because of AKI.  Will treat medically at this time.  In terms of imaging.  Patient's imaging unremarkable.  No evidence of any kind of acute intracranial pathology such as subdural epidural.  CT C-spine clear.  CT chest abdomen shows no acute pathology as well.  Patient has AKI.  Creatinine 3.47.  Sinus around 2.  Received 500 cc bolus here in the ED.  Cardiology plans on dopamine as well as norepinephrine to better address his perfusion issues in the setting of his AKI.  Admitted for further care.   Interventions: 500 cc bolus  EKG Interpreted by Me: Sinus    Cardiac Tele Interpreted by Me: Sinus brady    I have independently interpreted the CXR  and CT  images and agree with the radiologist finding   Social Determinant of Health: None    Disposition and Follow Up: Admit    CRITICAL CARE Performed by: Lavonia LOISE Pat   Total critical care time: 65 minutes  Critical care time was exclusive of separately billable procedures and treating other patients.  Critical care was necessary to treat or prevent imminent or life-threatening deterioration.  Critical care was time spent personally by me on the following activities: development of treatment plan with  patient and/or surrogate as well as nursing, discussions with consultants, evaluation of patient's response to treatment, examination of patient, obtaining history from patient or surrogate, ordering and performing treatments and interventions, ordering and review of laboratory studies, ordering and review of radiographic studies, pulse oximetry and re-evaluation of patient's condition.      Final diagnoses:  Unstable angina pectoris (HCC)  Hypotensive episode  Elevated troponin  AKI (acute kidney injury)  Fall, initial encounter    ED Discharge Orders     None          Pat Lavonia LOISE, MD 08/05/24 2029

## 2024-08-05 NOTE — Progress Notes (Addendum)
 Dr. Stacia requested admit orders be placed on this patient, which include: - admit to inpt, 2H ICU bed - heparin per pharmacy - ASA 324mg  x1 now given, then 81mg  daily in AM - Plavix load 300mg  x1 now, then 75mg  daily - switching statin to atorvastin given renal issues (rosuvastatin  renally cleared) - norepinephrine drip titrated to MAP of >70 - dopamine drip for bradycardia - 2d echo - IVF @ 75cc/hr continuously - please review plans for end timing in AM - fentanyl  25mcg q4 hr scheduled not PRN, have placed for 4 doses to re-eval dosing in AM - NPO for now with consent for temp wire placement when interventionalist becomes available -> she spoke with Dr. Wendel who is engaged in a separate case at this time and will plan to pursue when available - continue Lokelma - SSI - otherwise hold non emergent home meds for now, will need review of med rec in AM   Addendum: nurse spoke with Dr. Mallipeddi to clarify her order for dopamine and norepinephrine. She told ER nurse to start norepi first then dopamine in 10-15 minutes. The nurse also indicated EDP wrote for IV fluids at 250ml/hr; I cannot see this order under the orders tab to Dc. Dr. Mallipeddi recommended ER change this to 250cc bolus now then 75/hr as planned thereafter. Nurse will amend bolus order in Epic.  Dr. Mallipeddi also clarified patient wishes to be DNR/DNI with the order temporarily rescinded during temp wire placement. Have signed out to cardiology fellow of plan for temp wire tonight as well.

## 2024-08-05 NOTE — Progress Notes (Signed)
 ANTICOAGULATION CONSULT NOTE  Pharmacy Consult for Heparin Indication: chest pain/ACS  No Known Allergies  Patient Measurements: Height: 5' 8 (172.7 cm) Weight: 80.7 kg (178 lb) IBW/kg (Calculated) : 68.4 Heparin Dosing Weight: 80.7kg  Vital Signs: Temp: 98.6 F (37 C) (10/31 1532) Temp Source: Oral (10/31 1532) BP: 91/46 (10/31 1830) Pulse Rate: 42 (10/31 1830)  Labs: Recent Labs    08/05/24 1543 08/05/24 1741  HGB 12.2*  --   HCT 37.3*  --   PLT 158  --   CREATININE 3.47*  --   TROPONINIHS 22,494* 16,320*    Estimated Creatinine Clearance: 14.2 mL/min (A) (by C-G formula based on SCr of 3.47 mg/dL (H)).   Medical History: Past Medical History:  Diagnosis Date   Acid reflux disease    Arthritis    Complication of anesthesia    appendix surgery (89 year old) - passed out once patient got back to floor unit   Diabetes mellitus    Dyspnea    Family history of adverse reaction to anesthesia    daughter states that she has a difficult time waking up   Hiatal hernia    High cholesterol    Hypertension    Hypothyroidism    Lung cancer (HCC)    s/p XI robotic assisted thoracoscopy for LU lobectomy 06/22/20   Monoclonal gammopathy of unknown significance (MGUS) 03/28/2020   Normocytic anemia 03/28/2020   Renal disorder    renal insufficiency   Type 2 diabetes mellitus (HCC)     Medications:  (Not in a hospital admission)  Scheduled:  Infusions:  PRN:   Assessment: 89 yom with a history of CKD, HTN, HLD, lung cancer, T2DM. Patient is presenting with chest pain. Heparin per pharmacy consult placed for chest pain/ACS.  Patient is not on anticoagulation prior to arrival.  Hgb 12.2; plt 158  Goal of Therapy:  Heparin level 0.3-0.7 units/ml Monitor platelets by anticoagulation protocol: Yes   Plan:  Give IV heparin 4000 units bolus x 1 Start heparin infusion at 950 units/hr Check anti-Xa level in 8 hours and daily while on heparin Continue to monitor  H&H and platelets  Dorn Buttner, PharmD, BCPS 08/05/2024 7:13 PM ED Clinical Pharmacist -  [phone]

## 2024-08-06 ENCOUNTER — Other Ambulatory Visit: Payer: Self-pay

## 2024-08-06 ENCOUNTER — Inpatient Hospital Stay (HOSPITAL_COMMUNITY)

## 2024-08-06 ENCOUNTER — Encounter (HOSPITAL_COMMUNITY): Payer: Self-pay | Admitting: Internal Medicine

## 2024-08-06 DIAGNOSIS — N189 Chronic kidney disease, unspecified: Secondary | ICD-10-CM

## 2024-08-06 DIAGNOSIS — I2119 ST elevation (STEMI) myocardial infarction involving other coronary artery of inferior wall: Secondary | ICD-10-CM

## 2024-08-06 DIAGNOSIS — N179 Acute kidney failure, unspecified: Secondary | ICD-10-CM | POA: Diagnosis not present

## 2024-08-06 DIAGNOSIS — I442 Atrioventricular block, complete: Secondary | ICD-10-CM

## 2024-08-06 DIAGNOSIS — E875 Hyperkalemia: Secondary | ICD-10-CM

## 2024-08-06 LAB — ECHOCARDIOGRAM COMPLETE
Area-P 1/2: 3.2 cm2
Height: 68 in
MV M vel: 5.14 m/s
MV Peak grad: 105.7 mmHg
Radius: 0.7 cm
S' Lateral: 3.4 cm
Weight: 2848 [oz_av]

## 2024-08-06 LAB — URINALYSIS, ROUTINE W REFLEX MICROSCOPIC
Bacteria, UA: NONE SEEN
Bilirubin Urine: NEGATIVE
Glucose, UA: NEGATIVE mg/dL
Hgb urine dipstick: NEGATIVE
Ketones, ur: 5 mg/dL — AB
Nitrite: NEGATIVE
Protein, ur: 30 mg/dL — AB
Specific Gravity, Urine: 1.013 (ref 1.005–1.030)
pH: 5 (ref 5.0–8.0)

## 2024-08-06 LAB — CBC
HCT: 37.1 % — ABNORMAL LOW (ref 39.0–52.0)
Hemoglobin: 12.3 g/dL — ABNORMAL LOW (ref 13.0–17.0)
MCH: 30 pg (ref 26.0–34.0)
MCHC: 33.2 g/dL (ref 30.0–36.0)
MCV: 90.5 fL (ref 80.0–100.0)
Platelets: 155 K/uL (ref 150–400)
RBC: 4.1 MIL/uL — ABNORMAL LOW (ref 4.22–5.81)
RDW: 15 % (ref 11.5–15.5)
WBC: 15.7 K/uL — ABNORMAL HIGH (ref 4.0–10.5)
nRBC: 0 % (ref 0.0–0.2)

## 2024-08-06 LAB — COMPREHENSIVE METABOLIC PANEL WITH GFR
ALT: 31 U/L (ref 0–44)
AST: 68 U/L — ABNORMAL HIGH (ref 15–41)
Albumin: 3.2 g/dL — ABNORMAL LOW (ref 3.5–5.0)
Alkaline Phosphatase: 40 U/L (ref 38–126)
Anion gap: 16 — ABNORMAL HIGH (ref 5–15)
BUN: 49 mg/dL — ABNORMAL HIGH (ref 8–23)
CO2: 17 mmol/L — ABNORMAL LOW (ref 22–32)
Calcium: 8.7 mg/dL — ABNORMAL LOW (ref 8.9–10.3)
Chloride: 101 mmol/L (ref 98–111)
Creatinine, Ser: 3.44 mg/dL — ABNORMAL HIGH (ref 0.61–1.24)
GFR, Estimated: 16 mL/min — ABNORMAL LOW (ref >60)
Glucose, Bld: 180 mg/dL — ABNORMAL HIGH (ref 70–99)
Potassium: 5.9 mmol/L — ABNORMAL HIGH (ref 3.5–5.1)
Sodium: 134 mmol/L — ABNORMAL LOW (ref 135–145)
Total Bilirubin: 1.1 mg/dL (ref 0.0–1.2)
Total Protein: 6.9 g/dL (ref 6.5–8.1)

## 2024-08-06 LAB — HEPARIN LEVEL (UNFRACTIONATED)
Heparin Unfractionated: 0.3 [IU]/mL (ref 0.30–0.70)
Heparin Unfractionated: 0.68 [IU]/mL (ref 0.30–0.70)
Heparin Unfractionated: <0.1 [IU]/mL — ABNORMAL LOW (ref 0.30–0.70)
Heparin Unfractionated: 0.46 [IU]/mL (ref 0.30–0.70)

## 2024-08-06 LAB — POTASSIUM
Potassium: 5.1 mmol/L (ref 3.5–5.1)
Potassium: 4.7 mmol/L (ref 3.5–5.1)
Potassium: 4.5 mmol/L (ref 3.5–5.1)
Potassium: 4.6 mmol/L (ref 3.5–5.1)

## 2024-08-06 LAB — LIPID PANEL
Cholesterol: 92 mg/dL (ref 0–200)
HDL: 52 mg/dL (ref >40)
LDL Cholesterol: 30 mg/dL (ref 0–99)
Total CHOL/HDL Ratio: 1.8 ratio
Triglycerides: 51 mg/dL (ref <150)
VLDL: 10 mg/dL (ref 0–40)

## 2024-08-06 LAB — MRSA NEXT GEN BY PCR, NASAL: MRSA by PCR Next Gen: NOT DETECTED

## 2024-08-06 LAB — GLUCOSE, CAPILLARY
Glucose-Capillary: 173 mg/dL — ABNORMAL HIGH (ref 70–99)
Glucose-Capillary: 220 mg/dL — ABNORMAL HIGH (ref 70–99)
Glucose-Capillary: 169 mg/dL — ABNORMAL HIGH (ref 70–99)
Glucose-Capillary: 282 mg/dL — ABNORMAL HIGH (ref 70–99)

## 2024-08-06 MED ORDER — SODIUM ZIRCONIUM CYCLOSILICATE 5 G PO PACK
5.0000 g | PACK | ORAL | Status: DC
  Administered 2024-08-06 – 2024-08-08 (×2): 5 g via ORAL
  Filled 2024-08-06 (×3): qty 1

## 2024-08-06 MED ORDER — LEVOTHYROXINE SODIUM 75 MCG PO TABS
75.0000 ug | ORAL_TABLET | Freq: Every day | ORAL | Status: DC
  Administered 2024-08-07 – 2024-08-23 (×17): 75 ug via ORAL
  Filled 2024-08-06 (×18): qty 1

## 2024-08-06 MED ORDER — ORAL CARE MOUTH RINSE: 15.0000 mL | OROMUCOSAL | Status: DC | PRN

## 2024-08-06 MED ORDER — NITROGLYCERIN 0.4 MG SL SUBL
0.4000 mg | SUBLINGUAL_TABLET | SUBLINGUAL | Status: AC | PRN
  Administered 2024-08-06 – 2024-08-13 (×3): 0.4 mg via SUBLINGUAL
  Filled 2024-08-06: qty 1

## 2024-08-06 MED ORDER — OXYCODONE HCL 5 MG PO TABS
5.0000 mg | ORAL_TABLET | Freq: Three times a day (TID) | ORAL | Status: DC | PRN
  Administered 2024-08-08 – 2024-08-19 (×5): 5 mg via ORAL
  Filled 2024-08-06 (×5): qty 1

## 2024-08-06 MED ORDER — INSULIN ASPART 100 UNIT/ML IV SOLN
5.0000 [IU] | Freq: Once | INTRAVENOUS | Status: AC
  Administered 2024-08-06: 5 [IU] via INTRAVENOUS

## 2024-08-06 MED ORDER — DEXTROSE 50 % IV SOLN
1.0000 | Freq: Once | INTRAVENOUS | Status: AC
  Administered 2024-08-06: 50 mL via INTRAVENOUS
  Filled 2024-08-06: qty 50

## 2024-08-06 MED ORDER — NITROGLYCERIN 0.4 MG SL SUBL
SUBLINGUAL_TABLET | SUBLINGUAL | Status: AC
Start: 2024-08-06 — End: 2024-08-06
  Administered 2024-08-06: 0.4 mg
  Filled 2024-08-06: qty 1

## 2024-08-06 MED ORDER — GABAPENTIN 300 MG PO CAPS
300.0000 mg | ORAL_CAPSULE | Freq: Two times a day (BID) | ORAL | Status: DC
  Administered 2024-08-06 – 2024-08-23 (×35): 300 mg via ORAL
  Filled 2024-08-06 (×35): qty 1

## 2024-08-06 MED ORDER — FUROSEMIDE 10 MG/ML IJ SOLN
20.0000 mg | Freq: Once | INTRAMUSCULAR | Status: AC
  Administered 2024-08-06: 20 mg via INTRAVENOUS
  Filled 2024-08-06: qty 2

## 2024-08-06 MED ORDER — GUAIFENESIN ER 600 MG PO TB12
600.0000 mg | ORAL_TABLET | Freq: Two times a day (BID) | ORAL | Status: DC
  Administered 2024-08-06 – 2024-08-20 (×29): 600 mg via ORAL
  Filled 2024-08-06 (×29): qty 1

## 2024-08-06 MED ORDER — CHLORHEXIDINE GLUCONATE CLOTH 2 % EX PADS
6.0000 | MEDICATED_PAD | Freq: Every day | CUTANEOUS | Status: DC
  Administered 2024-08-06 – 2024-08-23 (×18): 6 via TOPICAL

## 2024-08-06 MED ORDER — TAMSULOSIN HCL 0.4 MG PO CAPS
0.4000 mg | ORAL_CAPSULE | Freq: Every day | ORAL | Status: DC
  Administered 2024-08-06 – 2024-08-23 (×18): 0.4 mg via ORAL
  Filled 2024-08-06 (×18): qty 1

## 2024-08-06 NOTE — Progress Notes (Signed)
 ANTICOAGULATION CONSULT NOTE  Pharmacy Consult for Heparin Indication: chest pain/ACS  No Known Allergies  Patient Measurements: Height: 5' 8 (172.7 cm) Weight: 80.7 kg (178 lb) IBW/kg (Calculated) : 68.4 Heparin Dosing Weight: 80.7kg  Vital Signs: Temp: 100.5 F (38.1 C) (11/01 2310) Temp Source: Oral (11/01 2310) BP: 116/51 (11/01 2300) Pulse Rate: 55 (11/01 2300)  Labs: Recent Labs    08/05/24 1543 08/05/24 1741 08/06/24 0320 08/06/24 0320 08/06/24 1050 08/06/24 1300 08/06/24 2304  HGB 12.2*  --  12.3*  --   --   --   --   HCT 37.3*  --  37.1*  --   --   --   --   PLT 158  --  155  --   --   --   --   HEPARINUNFRC  --   --  0.30   < > 0.68 <0.10* 0.46  CREATININE 3.47*  --  3.44*  --   --   --   --   CKTOTAL  --  375  --   --   --   --   --   TROPONINIHS 22,494* 16,320*  --   --   --   --   --    < > = values in this interval not displayed.    Estimated Creatinine Clearance: 14.4 mL/min (A) (by C-G formula based on SCr of 3.44 mg/dL (H)).   Medical History: Past Medical History:  Diagnosis Date   Acid reflux disease    Arthritis    Complication of anesthesia    appendix surgery (89 year old) - passed out once patient got back to floor unit   Diabetes mellitus    Dyspnea    Family history of adverse reaction to anesthesia    daughter states that she has a difficult time waking up   Hiatal hernia    High cholesterol    Hypertension    Hypothyroidism    Lung cancer (HCC)    s/p XI robotic assisted thoracoscopy for LU lobectomy 06/22/20   Monoclonal gammopathy of unknown significance (MGUS) 03/28/2020   Normocytic anemia 03/28/2020   Renal disorder    renal insufficiency   Type 2 diabetes mellitus (HCC)     Medications:  Medications Prior to Admission  Medication Sig Dispense Refill Last Dose/Taking   acetaminophen  (TYLENOL ) 500 MG tablet Take 2 tablets (1,000 mg total) by mouth every 6 (six) hours. 30 tablet 0    albuterol  (VENTOLIN  HFA) 108 (90  Base) MCG/ACT inhaler INAHLE 2 PUFFS BY MOUTH 4 TIMES A DAY AS NEEDED FOR COUGH/WHEEZING      diclofenac Sodium (VOLTAREN) 1 % GEL Apply 2 g topically daily as needed (pain).      docusate sodium  (COLACE) 100 MG capsule Take 1 capsule (100 mg total) by mouth 2 (two) times daily as needed for mild constipation. (Patient taking differently: Take 100 mg by mouth 2 (two) times a week.) 10 capsule 0    ferrous sulfate 325 (65 FE) MG tablet Take 325 mg by mouth 2 (two) times a week.      gabapentin  (NEURONTIN ) 300 MG capsule Take 300 mg by mouth 3 (three) times daily.      glipiZIDE  (GLUCOTROL  XL) 2.5 MG 24 hr tablet Take 2.5 mg by mouth daily as needed (blood sugar above 160).       HYDROcodone -acetaminophen  (NORCO) 7.5-325 MG tablet Take 1 tablet by mouth every 6 (six) hours as needed for moderate pain (pain score  4-6). 10 tablet 0    Lancets (ONETOUCH DELICA PLUS LANCET30G) MISC 2 (two) times daily.      levothyroxine  (SYNTHROID ) 75 MCG tablet Take 75 mcg by mouth daily before breakfast.      LOKELMA 5 g packet Take 5 g by mouth every Monday, Wednesday, and Friday.      loratadine (CLARITIN) 10 MG tablet Take 1 tablet by mouth daily.      Multiple Vitamin (MULTIVITAMIN ADULT PO) Take 1 tablet by mouth daily.      Multiple Vitamins-Minerals (PRESERVISION AREDS PO) Take 1 tablet by mouth in the morning and at bedtime.      mupirocin ointment (BACTROBAN) 2 % Apply 1 Application topically 3 (three) times daily.      omeprazole (PRILOSEC) 20 MG capsule Take 20 mg by mouth daily.      ONETOUCH ULTRA test strip 2 (two) times daily.      pantoprazole (PROTONIX) 20 MG tablet Take 20 mg by mouth daily.      rosuvastatin  (CRESTOR ) 40 MG tablet Take 40 mg by mouth daily.       telmisartan (MICARDIS) 20 MG tablet Take 10 mg by mouth daily.      VITAMIN D  PO Take 2,000 Units by mouth daily.      VITAMIN E PO Take 1 capsule by mouth daily.      Scheduled:   aspirin EC  81 mg Oral Daily   atorvastatin  80 mg  Oral Daily   Chlorhexidine  Gluconate Cloth  6 each Topical Daily   clopidogrel  75 mg Oral Daily   gabapentin   300 mg Oral BID   guaiFENesin  600 mg Oral BID   insulin  aspart  0-9 Units Subcutaneous TID WC   [START ON 08/07/2024] levothyroxine   75 mcg Oral Q0600   pantoprazole  40 mg Oral Daily   sodium zirconium cyclosilicate  5 g Oral Q M,W,F   tamsulosin   0.4 mg Oral Daily   Infusions:   DOPamine 5 mcg/kg/min (08/06/24 1828)   heparin 1,200 Units/hr (08/06/24 1800)   norepinephrine (LEVOPHED) Adult infusion 4 mcg/min (08/06/24 1952)   PRN: acetaminophen , nitroGLYCERIN, ondansetron  (ZOFRAN ) IV, mouth rinse, oxyCODONE   Assessment: 89 yom with a history of CKD, HTN, HLD, lung cancer, T2DM. Patient is presenting with chest pain. Heparin per pharmacy consult placed for chest pain/ACS.  Patient is not on anticoagulation prior to arrival.  Hgb 12.2; plt 158  11/1 PM update:  Heparin level therapeutic   Goal of Therapy:  Heparin level 0.3-0.7 units/ml Monitor platelets by anticoagulation protocol: Yes   Plan:  Cont heparin infusion at 1200 units/hr Heparin level with AM labs  Lynwood Mckusick, PharmD, BCPS Clinical Pharmacist Phone: [phone]

## 2024-08-06 NOTE — Progress Notes (Signed)
 Advanced Heart Failure Rounding Note  Cardiologist: Vishnu P Mallipeddi, MD  Chief Complaint: Heart block Subjective:    Patient developed chest pain that started about four days prir to admission. Reports some residual, but now feeling better. Remains in complete heart block. On low dose dopamine and Ne.    Objective:   Weight Range: 80.7 kg Body mass index is 27.06 kg/m.   Vital Signs:   Temp:  [97.4 F (36.3 C)-99.2 F (37.3 C)] 97.4 F (36.3 C) (11/01 0700) Pulse Rate:  [41-82] 49 (11/01 1615) Resp:  [11-23] 20 (11/01 1615) BP: (86-154)/(40-80) 96/51 (11/01 1615) SpO2:  [89 %-100 %] 94 % (11/01 1615) Last BM Date :  (pta)  Weight change: Filed Weights   08/05/24 1535  Weight: 80.7 kg    Intake/Output:   Intake/Output Summary (Last 24 hours) at 08/06/2024 1705 Last data filed at 08/06/2024 1500 Gross per 24 hour  Intake 3845.44 ml  Output 1025 ml  Net 2820.44 ml      Physical Exam    GENERAL: NAD, elderly appearing PULM:  Normal work of breathing, CTAB CARDIAC:  JVP: flat         Bradycardic rate with regular rhythm. No murmurs, rubs or gallops.  No edema. Warm and well perfused extremities. ABDOMEN: Soft, non-tender, non-distended. NEUROLOGIC: Patient is oriented x3 with no focal or lateralizing neurologic deficits.    Telemetry   Complete heart block with junctional escape  Medications:     Scheduled Medications:  aspirin EC  81 mg Oral Daily   atorvastatin  80 mg Oral Daily   Chlorhexidine  Gluconate Cloth  6 each Topical Daily   clopidogrel  75 mg Oral Daily   gabapentin   300 mg Oral BID   guaiFENesin  600 mg Oral BID   insulin  aspart  0-9 Units Subcutaneous TID WC   [START ON 08/07/2024] levothyroxine   75 mcg Oral Q0600   pantoprazole  40 mg Oral Daily   sodium zirconium cyclosilicate  5 g Oral Q M,W,F   tamsulosin   0.4 mg Oral Daily    Infusions:  sodium chloride  75 mL/hr at 08/06/24 1500   DOPamine 5 mcg/kg/min (08/06/24 1500)    heparin 1,200 Units/hr (08/06/24 1533)   norepinephrine (LEVOPHED) Adult infusion 5 mcg/min (08/06/24 1534)    PRN Medications: acetaminophen , ondansetron  (ZOFRAN ) IV, mouth rinse    Patient Profile   Patient is a 89 y.o. male with a PMH of CKD, HTN, lung cancer s/p lobectomy who presents with chest pain and complete heart block.  Assessment/Plan    Complete heart block: Noted on arrival to the ED, remains on low dose vasopressors with dopamine and NE. Considered for temp wire last night, held off as chest pain and BP improved with supportive measures. - Shift K as below - Hold nodal blocking agents - NE/dopamine for BP support - Discussed with EP, will make NPO at midnight tomorrow for potential device  Chest pain: Troponin significantly elevated at 22K on arrival and downtrending. Suspect missed MI, no actionable STEMI on EKG, previously discussed with interventional and managed medically given his worsening CKD and age. - Prn nitroglycerin for chest pain - Continue heparin for 48 hours - Continue aspirin/plavix - Discussed with family at bedside  AKI on CKD: Suspect due to shock as well as obstruction, urine output increased after Foley placement. - No diuresis, trend creatinine  Hyperkalemia: Noted on arrival, K 5.9 this morning. - Shift given arrhythmia, improved after foley placement  CRITICAL  CARE Performed by: Morene JINNY Brownie   Total critical care time: 36 minutes  Critical care time was exclusive of separately billable procedures and treating other patients.  Critical care was necessary to treat or prevent imminent or life-threatening deterioration.  Critical care was time spent personally by me on the following activities: development of treatment plan with patient and/or surrogate as well as nursing, discussions with consultants, evaluation of patient's response to treatment, examination of patient, obtaining history from patient or surrogate, ordering and  performing treatments and interventions, ordering and review of laboratory studies, ordering and review of radiographic studies, pulse oximetry and re-evaluation of patient's condition.   Medication concerns reviewed with patient and pharmacy team. Barriers identified:   Length of Stay: 1  Morene JINNY Brownie, MD  08/06/2024, 5:05 PM  Advanced Heart Failure Team Pager [phone] (M-F; 7a - 5p)  Please contact [facility] for night-coverage after hours (5p -7a ) and weekends on [email or web address]

## 2024-08-06 NOTE — Progress Notes (Signed)
 ANTICOAGULATION CONSULT NOTE  Pharmacy Consult for Heparin Indication: chest pain/ACS  No Known Allergies  Patient Measurements: Height: 5' 8 (172.7 cm) Weight: 80.7 kg (178 lb) IBW/kg (Calculated) : 68.4 Heparin Dosing Weight: 80.7kg  Vital Signs: Temp: 97.4 F (36.3 C) (11/01 0700) Temp Source: Axillary (11/01 0700) BP: 131/73 (11/01 0930) Pulse Rate: 48 (11/01 0930)  Labs: Recent Labs    08/05/24 1543 08/05/24 1741 08/06/24 0320  HGB 12.2*  --  12.3*  HCT 37.3*  --  37.1*  PLT 158  --  155  HEPARINUNFRC  --   --  0.30  CREATININE 3.47*  --  3.44*  CKTOTAL  --  375  --   TROPONINIHS 22,494* 16,320*  --     Estimated Creatinine Clearance: 14.4 mL/min (A) (by C-G formula based on SCr of 3.44 mg/dL (H)).   Medical History: Past Medical History:  Diagnosis Date   Acid reflux disease    Arthritis    Complication of anesthesia    appendix surgery (89 year old) - passed out once patient got back to floor unit   Diabetes mellitus    Dyspnea    Family history of adverse reaction to anesthesia    daughter states that she has a difficult time waking up   Hiatal hernia    High cholesterol    Hypertension    Hypothyroidism    Lung cancer (HCC)    s/p XI robotic assisted thoracoscopy for LU lobectomy 06/22/20   Monoclonal gammopathy of unknown significance (MGUS) 03/28/2020   Normocytic anemia 03/28/2020   Renal disorder    renal insufficiency   Type 2 diabetes mellitus (HCC)     Medications:  Medications Prior to Admission  Medication Sig Dispense Refill Last Dose/Taking   acetaminophen  (TYLENOL ) 500 MG tablet Take 2 tablets (1,000 mg total) by mouth every 6 (six) hours. 30 tablet 0    albuterol  (VENTOLIN  HFA) 108 (90 Base) MCG/ACT inhaler INAHLE 2 PUFFS BY MOUTH 4 TIMES A DAY AS NEEDED FOR COUGH/WHEEZING      diclofenac Sodium (VOLTAREN) 1 % GEL Apply 2 g topically daily as needed (pain).      docusate sodium  (COLACE) 100 MG capsule Take 1 capsule (100 mg  total) by mouth 2 (two) times daily as needed for mild constipation. (Patient taking differently: Take 100 mg by mouth 2 (two) times a week.) 10 capsule 0    ferrous sulfate 325 (65 FE) MG tablet Take 325 mg by mouth 2 (two) times a week.      gabapentin  (NEURONTIN ) 300 MG capsule Take 300 mg by mouth 3 (three) times daily.      glipiZIDE  (GLUCOTROL  XL) 2.5 MG 24 hr tablet Take 2.5 mg by mouth daily as needed (blood sugar above 160).       HYDROcodone -acetaminophen  (NORCO) 7.5-325 MG tablet Take 1 tablet by mouth every 6 (six) hours as needed for moderate pain (pain score 4-6). 10 tablet 0    Lancets (ONETOUCH DELICA PLUS LANCET30G) MISC 2 (two) times daily.      levothyroxine  (SYNTHROID ) 75 MCG tablet Take 75 mcg by mouth daily before breakfast.      LOKELMA 5 g packet Take 5 g by mouth every Monday, Wednesday, and Friday.      loratadine (CLARITIN) 10 MG tablet Take 1 tablet by mouth daily.      Multiple Vitamin (MULTIVITAMIN ADULT PO) Take 1 tablet by mouth daily.      Multiple Vitamins-Minerals (PRESERVISION AREDS PO) Take 1  tablet by mouth in the morning and at bedtime.      mupirocin ointment (BACTROBAN) 2 % Apply 1 Application topically 3 (three) times daily.      omeprazole (PRILOSEC) 20 MG capsule Take 20 mg by mouth daily.      ONETOUCH ULTRA test strip 2 (two) times daily.      pantoprazole (PROTONIX) 20 MG tablet Take 20 mg by mouth daily.      rosuvastatin  (CRESTOR ) 40 MG tablet Take 40 mg by mouth daily.       telmisartan (MICARDIS) 20 MG tablet Take 10 mg by mouth daily.      VITAMIN D  PO Take 2,000 Units by mouth daily.      VITAMIN E PO Take 1 capsule by mouth daily.      Scheduled:   aspirin EC  81 mg Oral Daily   atorvastatin  80 mg Oral Daily   Chlorhexidine  Gluconate Cloth  6 each Topical Daily   clopidogrel  75 mg Oral Daily   insulin  aspart  0-9 Units Subcutaneous TID WC   pantoprazole  40 mg Oral Daily   sodium zirconium cyclosilicate  5 g Oral Q M,W,F   tamsulosin    0.4 mg Oral Daily   Infusions:   sodium chloride  75 mL/hr at 08/06/24 0900   DOPamine 5 mcg/kg/min (08/06/24 0900)   heparin 950 Units/hr (08/06/24 0900)   norepinephrine (LEVOPHED) Adult infusion 4 mcg/min (08/06/24 0900)   PRN: acetaminophen , ondansetron  (ZOFRAN ) IV, mouth rinse  Assessment: 89 yom with a history of CKD, HTN, HLD, lung cancer, T2DM. Patient is presenting with chest pain. Heparin per pharmacy consult placed for chest pain/ACS.  Patient is not on anticoagulation prior to arrival.  Heparin level 0.68 increased significantly from previous level, confirmed this level was drawn from the same hand the heparin was running in.   Repeat heparin level (drawn from opposite arm) returned at < 0.1 which is subtherapeutic with heparin running at 950 units/hr. Hgb (12.3) and PLTs (155) are stable. Per RN, no report of pauses, issues with the line, or signs of bleeding.    Goal of Therapy:  Heparin level 0.3-0.7 units/ml Monitor platelets by anticoagulation protocol: Yes   Plan:  Increase heparin infusion to 1200 units/hr Check 8 hour heparin level Monitor anti-Xa level daily while on heparin Continue to monitor H&H and platelets  Thank you for allowing pharmacy to be a part of this patient's care.   Nidia Schaffer, PharmD PGY2 Cardiology Pharmacy Resident  Please check AMION for all [facility] Pharm[facility] phone numbers After 10:00 PM, call Main Pharmacy [phone]

## 2024-08-06 NOTE — Progress Notes (Signed)
 ANTICOAGULATION CONSULT NOTE  Pharmacy Consult for Heparin Indication: chest pain/ACS  No Known Allergies  Patient Measurements: Height: 5' 8 (172.7 cm) Weight: 80.7 kg (178 lb) IBW/kg (Calculated) : 68.4 Heparin Dosing Weight: 80.7kg  Vital Signs: Temp: 99.2 F (37.3 C) (11/01 0325) Temp Source: Oral (11/01 0325) BP: 126/48 (11/01 0300) Pulse Rate: 49 (11/01 0300)  Labs: Recent Labs    08/05/24 1543 08/05/24 1741 08/06/24 0320  HGB 12.2*  --  12.3*  HCT 37.3*  --  37.1*  PLT 158  --  155  HEPARINUNFRC  --   --  0.30  CREATININE 3.47*  --  3.44*  CKTOTAL  --  375  --   TROPONINIHS 22,494* 16,320*  --     Estimated Creatinine Clearance: 14.4 mL/min (A) (by C-G formula based on SCr of 3.44 mg/dL (H)).   Medical History: Past Medical History:  Diagnosis Date   Acid reflux disease    Arthritis    Complication of anesthesia    appendix surgery (89 year old) - passed out once patient got back to floor unit   Diabetes mellitus    Dyspnea    Family history of adverse reaction to anesthesia    daughter states that she has a difficult time waking up   Hiatal hernia    High cholesterol    Hypertension    Hypothyroidism    Lung cancer (HCC)    s/p XI robotic assisted thoracoscopy for LU lobectomy 06/22/20   Monoclonal gammopathy of unknown significance (MGUS) 03/28/2020   Normocytic anemia 03/28/2020   Renal disorder    renal insufficiency   Type 2 diabetes mellitus (HCC)     Medications:  Medications Prior to Admission  Medication Sig Dispense Refill Last Dose/Taking   acetaminophen  (TYLENOL ) 500 MG tablet Take 2 tablets (1,000 mg total) by mouth every 6 (six) hours. 30 tablet 0    albuterol  (VENTOLIN  HFA) 108 (90 Base) MCG/ACT inhaler INAHLE 2 PUFFS BY MOUTH 4 TIMES A DAY AS NEEDED FOR COUGH/WHEEZING      diclofenac Sodium (VOLTAREN) 1 % GEL Apply 2 g topically daily as needed (pain).      docusate sodium  (COLACE) 100 MG capsule Take 1 capsule (100 mg total)  by mouth 2 (two) times daily as needed for mild constipation. (Patient taking differently: Take 100 mg by mouth 2 (two) times a week.) 10 capsule 0    ferrous sulfate 325 (65 FE) MG tablet Take 325 mg by mouth 2 (two) times a week.      gabapentin  (NEURONTIN ) 300 MG capsule Take 300 mg by mouth 3 (three) times daily.      glipiZIDE  (GLUCOTROL  XL) 2.5 MG 24 hr tablet Take 2.5 mg by mouth daily as needed (blood sugar above 160).       HYDROcodone -acetaminophen  (NORCO) 7.5-325 MG tablet Take 1 tablet by mouth every 6 (six) hours as needed for moderate pain (pain score 4-6). 10 tablet 0    Lancets (ONETOUCH DELICA PLUS LANCET30G) MISC 2 (two) times daily.      levothyroxine  (SYNTHROID ) 75 MCG tablet Take 75 mcg by mouth daily before breakfast.      LOKELMA 5 g packet Take 5 g by mouth every Monday, Wednesday, and Friday.      loratadine (CLARITIN) 10 MG tablet Take 1 tablet by mouth daily.      Multiple Vitamin (MULTIVITAMIN ADULT PO) Take 1 tablet by mouth daily.      Multiple Vitamins-Minerals (PRESERVISION AREDS PO) Take 1  tablet by mouth in the morning and at bedtime.      mupirocin ointment (BACTROBAN) 2 % Apply 1 Application topically 3 (three) times daily.      omeprazole (PRILOSEC) 20 MG capsule Take 20 mg by mouth daily.      ONETOUCH ULTRA test strip 2 (two) times daily.      rosuvastatin  (CRESTOR ) 40 MG tablet Take 40 mg by mouth daily.       telmisartan (MICARDIS) 20 MG tablet Take 10 mg by mouth daily.      VITAMIN D  PO Take 2,000 Units by mouth daily.      VITAMIN E PO Take 1 capsule by mouth daily.      Scheduled:   aspirin EC  81 mg Oral Daily   atorvastatin  80 mg Oral Daily   clopidogrel  75 mg Oral Daily   insulin  aspart  0-9 Units Subcutaneous TID WC   pantoprazole  40 mg Oral Daily   sodium zirconium cyclosilicate  5 g Oral Q M,W,F   Infusions:   sodium chloride  75 mL/hr at 08/06/24 0300   DOPamine 5 mcg/kg/min (08/06/24 0300)   heparin 950 Units/hr (08/06/24 0300)    norepinephrine (LEVOPHED) Adult infusion 4 mcg/min (08/06/24 0300)   PRN: acetaminophen , ondansetron  (ZOFRAN ) IV  Assessment: 89 yom with a history of CKD, HTN, HLD, lung cancer, T2DM. Patient is presenting with chest pain. Heparin per pharmacy consult placed for chest pain/ACS.  Patient is not on anticoagulation prior to arrival.  Hgb 12.2; plt 158  11/1 AM update:  Heparin level therapeutic   Goal of Therapy:  Heparin level 0.3-0.7 units/ml Monitor platelets by anticoagulation protocol: Yes   Plan:  Cont heparin infusion at 950 units/hr Check anti-Xa level in 8 hours and daily while on heparin Continue to monitor H&H and platelets  Lynwood Mckusick, PharmD, BCPS Clinical Pharmacist Phone: [phone]

## 2024-08-06 NOTE — Progress Notes (Signed)
 Secure chat with Dr Zenaida and Dr Geralynn, states okay to place PICC in dominant arm.

## 2024-08-06 NOTE — Progress Notes (Signed)
 Assessed RUE for PICC placement per order.  All veins measure 61-100% PICC occupancy with the tourniquet on and get smaller as they are viewed proximally.  Assessed by 2 PICC RN's, self and Hye Sheral Jama PEAK.  Secure chat sent to Dr Zenaida, Dr Geralynn and Laneta RN. Referred for CVC or IR placement.

## 2024-08-06 NOTE — Progress Notes (Signed)
  Echocardiogram 2D Echocardiogram has been performed.  William Wells 08/06/2024, 4:52 PM

## 2024-08-07 DIAGNOSIS — N189 Chronic kidney disease, unspecified: Secondary | ICD-10-CM | POA: Diagnosis not present

## 2024-08-07 DIAGNOSIS — R079 Chest pain, unspecified: Secondary | ICD-10-CM

## 2024-08-07 DIAGNOSIS — I442 Atrioventricular block, complete: Secondary | ICD-10-CM | POA: Diagnosis not present

## 2024-08-07 DIAGNOSIS — N179 Acute kidney failure, unspecified: Secondary | ICD-10-CM | POA: Diagnosis not present

## 2024-08-07 LAB — CBC
HCT: 34.4 % — ABNORMAL LOW (ref 39.0–52.0)
Hemoglobin: 11.4 g/dL — ABNORMAL LOW (ref 13.0–17.0)
MCH: 30.2 pg (ref 26.0–34.0)
MCHC: 33.1 g/dL (ref 30.0–36.0)
MCV: 91 fL (ref 80.0–100.0)
Platelets: 141 K/uL — ABNORMAL LOW (ref 150–400)
RBC: 3.78 MIL/uL — ABNORMAL LOW (ref 4.22–5.81)
RDW: 14.6 % (ref 11.5–15.5)
WBC: 15.1 K/uL — ABNORMAL HIGH (ref 4.0–10.5)
nRBC: 0 % (ref 0.0–0.2)

## 2024-08-07 LAB — COMPREHENSIVE METABOLIC PANEL WITH GFR
ALT: 35 U/L (ref 0–44)
AST: 31 U/L (ref 15–41)
Albumin: 2.5 g/dL — ABNORMAL LOW (ref 3.5–5.0)
Alkaline Phosphatase: 44 U/L (ref 38–126)
Anion gap: 12 (ref 5–15)
BUN: 45 mg/dL — ABNORMAL HIGH (ref 8–23)
CO2: 18 mmol/L — ABNORMAL LOW (ref 22–32)
Calcium: 8.3 mg/dL — ABNORMAL LOW (ref 8.9–10.3)
Chloride: 101 mmol/L (ref 98–111)
Creatinine, Ser: 3.01 mg/dL — ABNORMAL HIGH (ref 0.61–1.24)
GFR, Estimated: 19 mL/min — ABNORMAL LOW (ref >60)
Glucose, Bld: 241 mg/dL — ABNORMAL HIGH (ref 70–99)
Potassium: 4.8 mmol/L (ref 3.5–5.1)
Sodium: 131 mmol/L — ABNORMAL LOW (ref 135–145)
Total Bilirubin: 0.9 mg/dL (ref 0.0–1.2)
Total Protein: 5.3 g/dL — ABNORMAL LOW (ref 6.5–8.1)

## 2024-08-07 LAB — GLUCOSE, CAPILLARY
Glucose-Capillary: 134 mg/dL — ABNORMAL HIGH (ref 70–99)
Glucose-Capillary: 209 mg/dL — ABNORMAL HIGH (ref 70–99)
Glucose-Capillary: 321 mg/dL — ABNORMAL HIGH (ref 70–99)
Glucose-Capillary: 225 mg/dL — ABNORMAL HIGH (ref 70–99)

## 2024-08-07 LAB — POTASSIUM
Potassium: 4.8 mmol/L (ref 3.5–5.1)
Potassium: 4.8 mmol/L (ref 3.5–5.1)

## 2024-08-07 LAB — HEPARIN LEVEL (UNFRACTIONATED): Heparin Unfractionated: 0.27 [IU]/mL — ABNORMAL LOW (ref 0.30–0.70)

## 2024-08-07 LAB — MAGNESIUM: Magnesium: 1.7 mg/dL (ref 1.7–2.4)

## 2024-08-07 MED ORDER — MAGNESIUM SULFATE IN D5W 1-5 GM/100ML-% IV SOLN
1.0000 g | Freq: Once | INTRAVENOUS | Status: AC
  Administered 2024-08-07: 1 g via INTRAVENOUS
  Filled 2024-08-07: qty 100

## 2024-08-07 MED ORDER — FUROSEMIDE 10 MG/ML IJ SOLN
40.0000 mg | Freq: Once | INTRAMUSCULAR | Status: AC
  Administered 2024-08-07: 40 mg via INTRAVENOUS
  Filled 2024-08-07: qty 4

## 2024-08-07 NOTE — Plan of Care (Signed)
  Problem: Coping: Goal: Ability to adjust to condition or change in health will improve Outcome: Progressing   Problem: Education: Goal: Understanding of cardiac disease, CV risk reduction, and recovery process will improve Outcome: Progressing   Problem: Cardiac: Goal: Ability to achieve and maintain adequate cardiovascular perfusion will improve Outcome: Progressing   Problem: Education: Goal: Knowledge of General Education information will improve Description: Including pain rating scale, medication(s)/side effects and non-pharmacologic comfort measures Outcome: Progressing   Problem: Clinical Measurements: Goal: Ability to maintain clinical measurements within normal limits will improve Outcome: Progressing   Problem: Coping: Goal: Level of anxiety will decrease Outcome: Progressing

## 2024-08-07 NOTE — Progress Notes (Addendum)
 ANTICOAGULATION CONSULT NOTE  Pharmacy Consult for Heparin Indication: chest pain/ACS  No Known Allergies  Patient Measurements: Height: 5' 8 (172.7 cm) Weight: 80.7 kg (178 lb) IBW/kg (Calculated) : 68.4 Heparin Dosing Weight: 80.7kg  Vital Signs: Temp: 100.1 F (37.8 C) (11/02 0634) Temp Source: Oral (11/02 0634) BP: 120/45 (11/02 0930) Pulse Rate: 51 (11/02 0930)  Labs: Recent Labs    08/05/24 1543 08/05/24 1741 08/06/24 0320 08/06/24 1050 08/06/24 1300 08/06/24 2304 08/07/24 0847  HGB 12.2*  --  12.3*  --   --   --  11.4*  HCT 37.3*  --  37.1*  --   --   --  34.4*  PLT 158  --  155  --   --   --  141*  HEPARINUNFRC  --   --  0.30   < > <0.10* 0.46 0.27*  CREATININE 3.47*  --  3.44*  --   --   --  3.01*  CKTOTAL  --  375  --   --   --   --   --   TROPONINIHS 22,494* 16,320*  --   --   --   --   --    < > = values in this interval not displayed.    Estimated Creatinine Clearance: 16.4 mL/min (A) (by C-G formula based on SCr of 3.01 mg/dL (H)).   Medical History: Past Medical History:  Diagnosis Date   Acid reflux disease    Arthritis    Complication of anesthesia    appendix surgery (89 year old) - passed out once patient got back to floor unit   Diabetes mellitus    Dyspnea    Family history of adverse reaction to anesthesia    daughter states that she has a difficult time waking up   Hiatal hernia    High cholesterol    Hypertension    Hypothyroidism    Lung cancer (HCC)    s/p XI robotic assisted thoracoscopy for LU lobectomy 06/22/20   Monoclonal gammopathy of unknown significance (MGUS) 03/28/2020   Normocytic anemia 03/28/2020   Renal disorder    renal insufficiency   Type 2 diabetes mellitus (HCC)     Medications:  Medications Prior to Admission  Medication Sig Dispense Refill Last Dose/Taking   acetaminophen  (TYLENOL ) 500 MG tablet Take 2 tablets (1,000 mg total) by mouth every 6 (six) hours. 30 tablet 0    albuterol  (VENTOLIN  HFA) 108  (90 Base) MCG/ACT inhaler INAHLE 2 PUFFS BY MOUTH 4 TIMES A DAY AS NEEDED FOR COUGH/WHEEZING      diclofenac Sodium (VOLTAREN) 1 % GEL Apply 2 g topically daily as needed (pain).      docusate sodium  (COLACE) 100 MG capsule Take 1 capsule (100 mg total) by mouth 2 (two) times daily as needed for mild constipation. (Patient taking differently: Take 100 mg by mouth 2 (two) times a week.) 10 capsule 0    ferrous sulfate 325 (65 FE) MG tablet Take 325 mg by mouth 2 (two) times a week.      gabapentin  (NEURONTIN ) 300 MG capsule Take 300 mg by mouth 3 (three) times daily.      glipiZIDE  (GLUCOTROL  XL) 2.5 MG 24 hr tablet Take 2.5 mg by mouth daily as needed (blood sugar above 160).       HYDROcodone -acetaminophen  (NORCO) 7.5-325 MG tablet Take 1 tablet by mouth every 6 (six) hours as needed for moderate pain (pain score 4-6). 10 tablet 0    Lancets (  ONETOUCH DELICA PLUS LANCET30G) MISC 2 (two) times daily.      levothyroxine  (SYNTHROID ) 75 MCG tablet Take 75 mcg by mouth daily before breakfast.      LOKELMA 5 g packet Take 5 g by mouth every Monday, Wednesday, and Friday.      loratadine (CLARITIN) 10 MG tablet Take 1 tablet by mouth daily.      Multiple Vitamin (MULTIVITAMIN ADULT PO) Take 1 tablet by mouth daily.      Multiple Vitamins-Minerals (PRESERVISION AREDS PO) Take 1 tablet by mouth in the morning and at bedtime.      mupirocin ointment (BACTROBAN) 2 % Apply 1 Application topically 3 (three) times daily.      omeprazole (PRILOSEC) 20 MG capsule Take 20 mg by mouth daily.      ONETOUCH ULTRA test strip 2 (two) times daily.      pantoprazole (PROTONIX) 20 MG tablet Take 20 mg by mouth daily.      rosuvastatin  (CRESTOR ) 40 MG tablet Take 40 mg by mouth daily.       telmisartan (MICARDIS) 20 MG tablet Take 10 mg by mouth daily.      VITAMIN D  PO Take 2,000 Units by mouth daily.      VITAMIN E PO Take 1 capsule by mouth daily.      Scheduled:   aspirin EC  81 mg Oral Daily   atorvastatin  80  mg Oral Daily   Chlorhexidine  Gluconate Cloth  6 each Topical Daily   clopidogrel  75 mg Oral Daily   gabapentin   300 mg Oral BID   guaiFENesin  600 mg Oral BID   insulin  aspart  0-9 Units Subcutaneous TID WC   levothyroxine   75 mcg Oral Q0600   pantoprazole  40 mg Oral Daily   sodium zirconium cyclosilicate  5 g Oral Q M,W,F   tamsulosin   0.4 mg Oral Daily   Infusions:   DOPamine 5 mcg/kg/min (08/07/24 0800)   heparin 1,200 Units/hr (08/07/24 0800)   norepinephrine (LEVOPHED) Adult infusion 7 mcg/min (08/07/24 0800)   PRN: acetaminophen , nitroGLYCERIN, ondansetron  (ZOFRAN ) IV, mouth rinse, oxyCODONE   Assessment: 89 yom with a history of CKD, HTN, HLD, lung cancer, T2DM. Patient is presenting with chest pain. Heparin per pharmacy consult placed for chest pain/ACS.  Patient is not on anticoagulation prior to arrival.  Heparin level 0.27 is slightly subtherapeutic with heparin running at 1200 units/hr. Hgb (11.4) and PLTs (141) are stable. Per RN, no report of pauses, issues with the line, or signs of bleeding.   After discussion with AHF MD, will have heparin infusion stop at 2000 as he will have been adequately treated for ACS medical management.  Goal of Therapy:  Heparin level 0.3-0.7 units/ml Monitor platelets by anticoagulation protocol: Yes   Plan:  Increase heparin infusion to 1300 units/hr, then stop infusion at 2000 Monitor anti-Xa level daily while on heparin Continue to monitor H&H and platelets  Thank you for allowing pharmacy to be a part of this patient's care.   Nidia Schaffer, PharmD PGY2 Cardiology Pharmacy Resident  Please check AMION for all [facility] phone numbers After 10:00 PM, call Main Pharmacy [phone]

## 2024-08-07 NOTE — Progress Notes (Addendum)
 Patient ID: William Wells, male   DOB: [birth year], 89 y.o.   MRN: [id number]     Advanced Heart Failure Rounding Note  Cardiologist: Diannah SHAUNNA Maywood, MD  Chief Complaint: Heart block Subjective:    No chest pain.  He remains on NE 7, dopamine 5.  Creatinine 3.44 => 3.01.  Currently 2:1 AVB, has had episodes of CHB with junctional escape, HR > 40 at all times.   Echo: EF 50-55%, inferior akinesis, RV normal, PASP 57 mmHg.    Objective:   Weight Range: 80.7 kg Body mass index is 27.06 kg/m.   Vital Signs:   Temp:  [99 F (37.2 C)-100.5 F (38.1 C)] 100.1 F (37.8 C) (11/02 0634) Pulse Rate:  [46-56] 51 (11/02 0930) Resp:  [15-28] 24 (11/02 0930) BP: (86-144)/(39-133) 120/45 (11/02 0930) SpO2:  [85 %-96 %] 90 % (11/02 0930) Last BM Date : 08/05/24  Weight change: Filed Weights   08/05/24 1535  Weight: 80.7 kg    Intake/Output:   Intake/Output Summary (Last 24 hours) at 08/07/2024 1144 Last data filed at 08/07/2024 1054 Gross per 24 hour  Intake 2372.1 ml  Output 1450 ml  Net 922.1 ml      Physical Exam    General: NAD Neck: JVP 8-9 cm, no thyromegaly or thyroid  nodule.  Lungs: Clear to auscultation bilaterally with normal respiratory effort. CV: Nondisplaced PMI.  Heart regular S1/S2, no S3/S4, no murmur.  No peripheral edema.   Abdomen: Soft, nontender, no hepatosplenomegaly, no distention.  Skin: Intact without lesions or rashes.  Neurologic: Alert and oriented x 3.  Psych: Normal affect. Extremities: No clubbing or cyanosis.  HEENT: Normal.   Telemetry   2:1 AVB currently, has had episodes of CHB with junctional escape, rate > 40 at all times.  Personally reviewed.   Medications:     Scheduled Medications:  aspirin EC  81 mg Oral Daily   atorvastatin  80 mg Oral Daily   Chlorhexidine  Gluconate Cloth  6 each Topical Daily   clopidogrel  75 mg Oral Daily   gabapentin   300 mg Oral BID   guaiFENesin  600 mg Oral BID   insulin  aspart  0-9 Units  Subcutaneous TID WC   levothyroxine   75 mcg Oral Q0600   pantoprazole  40 mg Oral Daily   sodium zirconium cyclosilicate  5 g Oral Q M,W,F   tamsulosin   0.4 mg Oral Daily    Infusions:  DOPamine 5 mcg/kg/min (08/07/24 0800)   heparin 1,300 Units/hr (08/07/24 1054)   magnesium sulfate bolus IVPB     norepinephrine (LEVOPHED) Adult infusion 7 mcg/min (08/07/24 0800)    PRN Medications: acetaminophen , nitroGLYCERIN, ondansetron  (ZOFRAN ) IV, mouth rinse, oxyCODONE     Patient Profile   Patient is a 89 y.o. male with a PMH of CKD, HTN, lung cancer s/p lobectomy who presents with chest pain and complete heart block.  Assessment/Plan    Complete heart block: Still from time to time in CHB with junctional escape, rate always > 40.  Currently 2:1 AVB. Etiology of CHB is inferior MI.  Remains on low dose vasopressors with dopamine 5 and NE 7. - Wean NE today as tolerated, decrease dopamine to 2.5.  - It is possible that rhythm may improve since CHB triggered by inferior MI.  - EP aware of patient, will keep NPO at midnight for PPM.   Chest pain: Troponin significantly elevated at 22K on arrival and downtrending. Suspect missed MI, no actionable STEMI on EKG, previously discussed  with interventional and managed medically given his worsening CKD and age. Echo showed EF 50-55%, inferior akinesis, RV normal, PASP 57 mmHg.  Suspect RCA occlusion.  Mild volume overload on exam. No chest pain.  - Prn nitroglycerin for chest pain - Continue heparin for 48 hours (stop tonight) - Continue aspirin/plavix - Lasix 40 mg IV x 1 today.  - Atorvastatin 80 daily.   AKI on CKD: Suspect due to shock as well as obstruction, urine output increased after Foley placement. Baseline creatinine 1.8.  - Creatinine trending down, 3.44 => 3.01.    CRITICAL CARE Performed by: Ezra Shuck   Total critical care time: 40 minutes  Critical care time was exclusive of separately billable procedures and treating  other patients.  Critical care was necessary to treat or prevent imminent or life-threatening deterioration.  Critical care was time spent personally by me on the following activities: development of treatment plan with patient and/or surrogate as well as nursing, discussions with consultants, evaluation of patient's response to treatment, examination of patient, obtaining history from patient or surrogate, ordering and performing treatments and interventions, ordering and review of laboratory studies, ordering and review of radiographic studies, pulse oximetry and re-evaluation of patient's condition.   Medication concerns reviewed with patient and pharmacy team. Barriers identified:   Length of Stay: 2  Ezra Shuck, MD  08/07/2024, 11:44 AM  Advanced Heart Failure Team Pager [phone] (M-F; 7a - 5p)  Please contact [facility] for night-coverage after hours (5p -7a ) and weekends on [email or web address]

## 2024-08-08 ENCOUNTER — Encounter: Payer: Self-pay | Admitting: Radiology

## 2024-08-08 DIAGNOSIS — I959 Hypotension, unspecified: Secondary | ICD-10-CM

## 2024-08-08 DIAGNOSIS — N179 Acute kidney failure, unspecified: Secondary | ICD-10-CM | POA: Diagnosis not present

## 2024-08-08 DIAGNOSIS — R570 Cardiogenic shock: Secondary | ICD-10-CM | POA: Diagnosis not present

## 2024-08-08 DIAGNOSIS — N189 Chronic kidney disease, unspecified: Secondary | ICD-10-CM | POA: Diagnosis not present

## 2024-08-08 LAB — CBC
HCT: 31.6 % — ABNORMAL LOW (ref 39.0–52.0)
Hemoglobin: 10.5 g/dL — ABNORMAL LOW (ref 13.0–17.0)
MCH: 30.2 pg (ref 26.0–34.0)
MCHC: 33.2 g/dL (ref 30.0–36.0)
MCV: 90.8 fL (ref 80.0–100.0)
Platelets: 144 K/uL — ABNORMAL LOW (ref 150–400)
RBC: 3.48 MIL/uL — ABNORMAL LOW (ref 4.22–5.81)
RDW: 14.7 % (ref 11.5–15.5)
WBC: 11.9 K/uL — ABNORMAL HIGH (ref 4.0–10.5)
nRBC: 0 % (ref 0.0–0.2)

## 2024-08-08 LAB — COMPREHENSIVE METABOLIC PANEL WITH GFR
ALT: 41 U/L (ref 0–44)
AST: 29 U/L (ref 15–41)
Albumin: 2.2 g/dL — ABNORMAL LOW (ref 3.5–5.0)
Alkaline Phosphatase: 54 U/L (ref 38–126)
Anion gap: 13 (ref 5–15)
BUN: 51 mg/dL — ABNORMAL HIGH (ref 8–23)
CO2: 17 mmol/L — ABNORMAL LOW (ref 22–32)
Calcium: 8 mg/dL — ABNORMAL LOW (ref 8.9–10.3)
Chloride: 99 mmol/L (ref 98–111)
Creatinine, Ser: 3.68 mg/dL — ABNORMAL HIGH (ref 0.61–1.24)
GFR, Estimated: 15 mL/min — ABNORMAL LOW (ref >60)
Glucose, Bld: 175 mg/dL — ABNORMAL HIGH (ref 70–99)
Potassium: 4.7 mmol/L (ref 3.5–5.1)
Sodium: 129 mmol/L — ABNORMAL LOW (ref 135–145)
Total Bilirubin: 0.9 mg/dL (ref 0.0–1.2)
Total Protein: 6.1 g/dL — ABNORMAL LOW (ref 6.5–8.1)

## 2024-08-08 LAB — URINALYSIS, ROUTINE W REFLEX MICROSCOPIC
Bilirubin Urine: NEGATIVE
Glucose, UA: NEGATIVE mg/dL
Ketones, ur: NEGATIVE mg/dL
Nitrite: NEGATIVE
Protein, ur: 100 mg/dL — AB
Specific Gravity, Urine: 1.025 (ref 1.005–1.030)
pH: 6 (ref 5.0–8.0)

## 2024-08-08 LAB — LIPOPROTEIN A (LPA): Lipoprotein (a): 45.9 nmol/L — ABNORMAL HIGH (ref <75.0)

## 2024-08-08 LAB — GLUCOSE, CAPILLARY
Glucose-Capillary: 190 mg/dL — ABNORMAL HIGH (ref 70–99)
Glucose-Capillary: 189 mg/dL — ABNORMAL HIGH (ref 70–99)
Glucose-Capillary: 242 mg/dL — ABNORMAL HIGH (ref 70–99)
Glucose-Capillary: 279 mg/dL — ABNORMAL HIGH (ref 70–99)

## 2024-08-08 LAB — URINALYSIS, MICROSCOPIC (REFLEX)

## 2024-08-08 LAB — MAGNESIUM: Magnesium: 2 mg/dL (ref 1.7–2.4)

## 2024-08-08 MED ORDER — ENOXAPARIN SODIUM 30 MG/0.3ML IJ SOSY
30.0000 mg | PREFILLED_SYRINGE | Freq: Every day | INTRAMUSCULAR | Status: DC
  Administered 2024-08-09 – 2024-08-10 (×2): 30 mg via SUBCUTANEOUS
  Filled 2024-08-08 (×2): qty 0.3

## 2024-08-08 MED ORDER — NOREPINEPHRINE 4 MG/250ML-% IV SOLN
0.0000 ug/min | INTRAVENOUS | Status: DC
  Administered 2024-08-08: 5 ug/min via INTRAVENOUS
  Administered 2024-08-09: 4 ug/min via INTRAVENOUS
  Filled 2024-08-08 (×2): qty 250

## 2024-08-08 MED ORDER — SODIUM CHLORIDE 0.9 % IV SOLN
2.0000 g | INTRAVENOUS | Status: AC
Start: 2024-08-08 — End: 2024-08-13
  Administered 2024-08-08 – 2024-08-13 (×6): 2 g via INTRAVENOUS
  Filled 2024-08-08 (×6): qty 20

## 2024-08-08 MED ORDER — SODIUM CHLORIDE 0.9 % IV SOLN: 250.0000 mL | INTRAVENOUS | Status: DC

## 2024-08-08 MED ORDER — MIDODRINE HCL 5 MG PO TABS
5.0000 mg | ORAL_TABLET | Freq: Three times a day (TID) | ORAL | Status: DC
  Administered 2024-08-08 (×2): 5 mg via ORAL
  Filled 2024-08-08 (×2): qty 1

## 2024-08-08 NOTE — Progress Notes (Addendum)
 Patient ID: William Wells, male   DOB: 1934-08-24, 89 y.o.   MRN: [id number]     Advanced Heart Failure Rounding Note  Cardiologist: Diannah SHAUNNA Maywood, MD   Chief Complaint: Heart block  Subjective:    Family reports he seems more fatigued today. Notes pain under left breast, reproducible with direct palpation. States he fell and caught himself with his hands prior to admission.  SBP soft 80s with attempts to wean NE and Dopamine this am.  Scr worse, 3.0>3.7  Rhythm looks like 2:1 heart block  Echo: EF 50-55%, inferior akinesis, RV normal, PASP 57 mmHg.    Objective:   Weight Range: 80.7 kg Body mass index is 27.06 kg/m.   Vital Signs:   Temp:  [98.7 F (37.1 C)-100.9 F (38.3 C)] 100.6 F (38.1 C) (11/03 1105) Pulse Rate:  [45-58] 48 (11/03 1230) Resp:  [16-29] 24 (11/03 1230) BP: (86-122)/(38-75) 105/39 (11/03 1230) SpO2:  [88 %-95 %] 93 % (11/03 1230) Last BM Date : 08/08/24  Weight change: Filed Weights   08/05/24 1535  Weight: 80.7 kg    Intake/Output:   Intake/Output Summary (Last 24 hours) at 08/08/2024 1312 Last data filed at 08/08/2024 1200 Gross per 24 hour  Intake 1179.94 ml  Output 1225 ml  Net -45.06 ml      Physical Exam    General:  Elderly male. Sitting up in bed. Neck: JVP 8-9 Cor: Regular rate & rhythm. No murmurs. Lungs: clear anteriorly Abdomen: soft, nontender, nondistended. Extremities: no edema Neuro: alert & orientedx3. Affect pleasant   Telemetry   2:1 AVB, 40s  Medications:     Scheduled Medications:  aspirin EC  81 mg Oral Daily   atorvastatin  80 mg Oral Daily   Chlorhexidine  Gluconate Cloth  6 each Topical Daily   clopidogrel  75 mg Oral Daily   gabapentin   300 mg Oral BID   guaiFENesin  600 mg Oral BID   insulin  aspart  0-9 Units Subcutaneous TID WC   levothyroxine   75 mcg Oral Q0600   pantoprazole  40 mg Oral Daily   sodium zirconium cyclosilicate  5 g Oral Q M,W,F   tamsulosin   0.4 mg Oral Daily     Infusions:  sodium chloride  Stopped (08/08/24 1207)   norepinephrine (LEVOPHED) Adult infusion 5 mcg/min (08/08/24 1207)    PRN Medications: acetaminophen , nitroGLYCERIN, ondansetron  (ZOFRAN ) IV, mouth rinse, oxyCODONE     Patient Profile   Patient is a 89 y.o. male with a PMH of CKD, HTN, lung cancer s/p lobectomy who presents with chest pain and complete heart block.  Assessment/Plan    Complete heart block: Currently 2:1 AVB, 40s. Etiology of CHB is inferior MI.   - It is possible that rhythm may improve since CHB triggered by inferior MI.  - EP aware of patient, watching for recovery. No plans for PPM today.  Cardiogenic shock - In setting of MI - Echo as below - Remains on low dose vasopressors with dopamine 5, NE 5. Wean as able, has wide pulse pressure. Target SBP > 100. See below regarding hypotension. - Mildly volume up on exam. With worsening Scr, will review diuretics with Dr. Zenaida.  Chest pain: Troponin significantly elevated at 22K on arrival and downtrending. Suspect missed MI, no actionable STEMI on EKG, previously discussed with interventional and managed medically given his worsening CKD and age. Echo showed EF 50-55%, inferior akinesis, RV normal, PASP 57 mmHg.  Suspect RCA occlusion.   - Prn nitroglycerin for chest  pain - Off heparin - Continue aspirin/plavix  - Atorvastatin 80 daily.   AKI on CKD: Suspect due to shock as well as obstruction, urine output increased after Foley placement. Baseline creatinine 1.8.  - Creatinine 3>3.7 today - Keep SBP > 100  Hypotension - Fever this am w/ leukocytosis - check blood cultures and urine - empiric abx - start midodrine 5 TID   CRITICAL CARE Performed by: COLLETTA SHAVER N   Total critical care time: 20 minutes  Critical care time was exclusive of separately billable procedures and treating other patients.  Critical care was necessary to treat or prevent imminent or life-threatening  deterioration.  Critical care was time spent personally by me on the following activities: development of treatment plan with patient and/or surrogate as well as nursing, discussions with consultants, evaluation of patient's response to treatment, examination of patient, obtaining history from patient or surrogate, ordering and performing treatments and interventions, ordering and review of laboratory studies, ordering and review of radiographic studies, pulse oximetry and re-evaluation of patient's condition.     Length of Stay: 3  Delsa Walder N, PA-C  08/08/2024, 1:12 PM  Advanced Heart Failure Team Pager [phone] (M-F; 7a - 5p)  Please contact [facility] for night-coverage after hours (5p -7a ) and weekends on [email or web address]

## 2024-08-08 NOTE — Plan of Care (Signed)
  Problem: Education: Goal: Ability to describe self-care measures that may prevent or decrease complications (Diabetes Survival Skills Education) will improve Outcome: Progressing Goal: Individualized Educational Video(s) Outcome: Progressing   Problem: Coping: Goal: Ability to adjust to condition or change in health will improve Outcome: Progressing   Problem: Fluid Volume: Goal: Ability to maintain a balanced intake and output will improve Outcome: Progressing   Problem: Health Behavior/Discharge Planning: Goal: Ability to identify and utilize available resources and services will improve Outcome: Progressing Goal: Ability to manage health-related needs will improve Outcome: Progressing   Problem: Metabolic: Goal: Ability to maintain appropriate glucose levels will improve Outcome: Progressing   Problem: Nutritional: Goal: Maintenance of adequate nutrition will improve Outcome: Progressing Goal: Progress toward achieving an optimal weight will improve Outcome: Progressing   Problem: Skin Integrity: Goal: Risk for impaired skin integrity will decrease Outcome: Progressing   Problem: Tissue Perfusion: Goal: Adequacy of tissue perfusion will improve Outcome: Progressing   Problem: Education: Goal: Understanding of cardiac disease, CV risk reduction, and recovery process will improve Outcome: Progressing Goal: Individualized Educational Video(s) Outcome: Progressing   Problem: Activity: Goal: Ability to tolerate increased activity will improve Outcome: Progressing   Problem: Cardiac: Goal: Ability to achieve and maintain adequate cardiovascular perfusion will improve Outcome: Progressing

## 2024-08-08 NOTE — TOC Initial Note (Signed)
 Transition of Care [facility]) - Initial/Assessment Note    Patient Details  Name: William Wells MRN: [id number] Date of Birth: 18-Mar-1934  [facility] CM/SW Contact:    Arlana JINNY Nicholaus [place] Phone Number: [phone] 08/08/2024, 4:03 PM  Clinical Narrative:    1:38 PM- HF CSW attempted to meet with patient at bedside. Patient family and staff in the room. CSW/CM will follow up at a more appropriate time.   HF CSW/CM will continue to follow and monitor for dc readiness.                      Patient Goals and CMS Choice            Expected Discharge Plan and Services                                              Prior Living Arrangements/Services                       Activities of Daily Living   ADL Screening (condition at time of admission) Independently performs ADLs?: Yes (appropriate for developmental age) Is the patient deaf or have difficulty hearing?: No Does the patient have difficulty seeing, even when wearing glasses/contacts?: No Does the patient have difficulty concentrating, remembering, or making decisions?: No  Permission Sought/Granted                  Emotional Assessment              Admission diagnosis:  Unstable angina pectoris (HCC) [I20.0] Elevated troponin [R79.89] AKI (acute kidney injury) [N17.9] Hypotensive episode [I95.9] Fall, initial encounter [W19.XXXA] Acute ST elevation myocardial infarction (STEMI) of inferior wall (HCC) [I21.19] Patient Active Problem List   Diagnosis Date Noted   Acute ST elevation myocardial infarction (STEMI) of inferior wall (HCC) 08/05/2024   Radiculopathy, lumbar region 06/08/2024   DOE (dyspnea on exertion) 01/05/2024   HLD (hyperlipidemia) 01/05/2024   Second degree AV block, Mobitz type I 01/05/2024   Headache 11/10/2023   Adenocarcinoma of left lung, stage 1 (HCC) 06/26/2020   Status post robot-assisted surgical procedure 06/22/2020   S/P robot-assisted  surgical procedure 06/22/2020   Mass of upper lobe of left lung 04/11/2020   Monoclonal gammopathy of unknown significance (MGUS) 03/28/2020   Normocytic anemia 03/28/2020   Hyperkalemia 07/15/2019   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) 07/15/2019   Benign hypertension with chronic kidney disease 07/15/2019   Sciatica, left side 02/17/2018   PCP:  Shona Norleen PEDLAR, MD Pharmacy:   [facility] [phone] - [place], [place] - [address] [place] [address] Phone: [phone] Fax: [phone]     Social Drivers of Health (SDOH) Social History: SDOH Screenings   Food Insecurity: No Food Insecurity (08/06/2024)  Housing: Low Risk  (08/06/2024)  Transportation Needs: No Transportation Needs (08/06/2024)  Utilities: Not At Risk (08/06/2024)  Alcohol Screen: Low Risk  (10/25/2020)  Depression (PHQ2-9): Low Risk  (04/19/2024)  Financial Resource Strain: Low Risk  (10/25/2020)  Physical Activity: Sufficiently Active (10/25/2020)  Social Connections: Moderately Isolated (08/06/2024)  Stress: No Stress Concern Present (10/25/2020)  Tobacco Use: Medium Risk (08/06/2024)   SDOH Interventions:     Readmission Risk Interventions     No data to display

## 2024-08-09 DIAGNOSIS — I2119 ST elevation (STEMI) myocardial infarction involving other coronary artery of inferior wall: Secondary | ICD-10-CM | POA: Diagnosis not present

## 2024-08-09 DIAGNOSIS — I442 Atrioventricular block, complete: Secondary | ICD-10-CM | POA: Diagnosis not present

## 2024-08-09 DIAGNOSIS — R570 Cardiogenic shock: Secondary | ICD-10-CM | POA: Diagnosis not present

## 2024-08-09 DIAGNOSIS — N189 Chronic kidney disease, unspecified: Secondary | ICD-10-CM | POA: Diagnosis not present

## 2024-08-09 DIAGNOSIS — N179 Acute kidney failure, unspecified: Secondary | ICD-10-CM | POA: Diagnosis not present

## 2024-08-09 LAB — COMPREHENSIVE METABOLIC PANEL WITH GFR
ALT: 101 U/L — ABNORMAL HIGH (ref 0–44)
AST: 92 U/L — ABNORMAL HIGH (ref 15–41)
Albumin: 2 g/dL — ABNORMAL LOW (ref 3.5–5.0)
Alkaline Phosphatase: 74 U/L (ref 38–126)
Anion gap: 12 (ref 5–15)
BUN: 63 mg/dL — ABNORMAL HIGH (ref 8–23)
CO2: 18 mmol/L — ABNORMAL LOW (ref 22–32)
Calcium: 7.7 mg/dL — ABNORMAL LOW (ref 8.9–10.3)
Chloride: 97 mmol/L — ABNORMAL LOW (ref 98–111)
Creatinine, Ser: 4.08 mg/dL — ABNORMAL HIGH (ref 0.61–1.24)
GFR, Estimated: 13 mL/min — ABNORMAL LOW (ref >60)
Glucose, Bld: 186 mg/dL — ABNORMAL HIGH (ref 70–99)
Potassium: 4.7 mmol/L (ref 3.5–5.1)
Sodium: 127 mmol/L — ABNORMAL LOW (ref 135–145)
Total Bilirubin: 0.8 mg/dL (ref 0.0–1.2)
Total Protein: 5.7 g/dL — ABNORMAL LOW (ref 6.5–8.1)

## 2024-08-09 LAB — CBC
HCT: 26.5 % — ABNORMAL LOW (ref 39.0–52.0)
Hemoglobin: 9.1 g/dL — ABNORMAL LOW (ref 13.0–17.0)
MCH: 30.4 pg (ref 26.0–34.0)
MCHC: 34.3 g/dL (ref 30.0–36.0)
MCV: 88.6 fL (ref 80.0–100.0)
Platelets: 140 K/uL — ABNORMAL LOW (ref 150–400)
RBC: 2.99 MIL/uL — ABNORMAL LOW (ref 4.22–5.81)
RDW: 14.6 % (ref 11.5–15.5)
WBC: 9.7 K/uL (ref 4.0–10.5)
nRBC: 0 % (ref 0.0–0.2)

## 2024-08-09 LAB — GLUCOSE, CAPILLARY
Glucose-Capillary: 172 mg/dL — ABNORMAL HIGH (ref 70–99)
Glucose-Capillary: 282 mg/dL — ABNORMAL HIGH (ref 70–99)
Glucose-Capillary: 261 mg/dL — ABNORMAL HIGH (ref 70–99)
Glucose-Capillary: 251 mg/dL — ABNORMAL HIGH (ref 70–99)

## 2024-08-09 MED ORDER — MIDODRINE HCL 5 MG PO TABS
10.0000 mg | ORAL_TABLET | Freq: Three times a day (TID) | ORAL | Status: DC
  Administered 2024-08-09 – 2024-08-16 (×24): 10 mg via ORAL
  Filled 2024-08-09 (×25): qty 2

## 2024-08-09 MED ORDER — GERHARDT'S BUTT CREAM
TOPICAL_CREAM | Freq: Every day | CUTANEOUS | Status: DC | PRN
  Administered 2024-08-09: 1 via TOPICAL
  Filled 2024-08-09 (×2): qty 60

## 2024-08-09 MED ORDER — FUROSEMIDE 10 MG/ML IJ SOLN
80.0000 mg | Freq: Once | INTRAMUSCULAR | Status: AC
  Administered 2024-08-09: 80 mg via INTRAVENOUS
  Filled 2024-08-09: qty 8

## 2024-08-09 NOTE — Progress Notes (Addendum)
 Patient ID: William Wells, male   DOB: 1933-12-09, 89 y.o.   MRN: [id number]     Advanced Heart Failure Rounding Note  Cardiologist: Diannah SHAUNNA Maywood, MD   Chief Complaint: Heart block  Subjective:    Off all pressors. Now on midodrine 10 mg TID.   Leukocytosis resolved. Now afebrile. Remains on empiric ceftriaxone. Cultures with no growth so far.   Scr 3>3.68>4.08.   Chest wall and neck sore from recent fall.  Echo: EF 50-55%, inferior akinesis, RV normal, PASP 57 mmHg.    Objective:   Weight Range: 84 kg Body mass index is 28.16 kg/m.   Vital Signs:   Temp:  [97.6 F (36.4 C)-99.4 F (37.4 C)] 99 F (37.2 C) (11/04 1100) Pulse Rate:  [46-70] 60 (11/04 1200) Resp:  [16-27] 19 (11/04 1200) BP: (85-150)/(33-100) 106/46 (11/04 1200) SpO2:  [91 %-97 %] 94 % (11/04 1200) Weight:  [84 kg] 84 kg (11/04 0909) Last BM Date : 08/08/24  Weight change: Filed Weights   08/05/24 1535 08/09/24 0909  Weight: 80.7 kg 84 kg    Intake/Output:   Intake/Output Summary (Last 24 hours) at 08/09/2024 1304 Last data filed at 08/09/2024 1200 Gross per 24 hour  Intake 929.09 ml  Output 865 ml  Net 64.09 ml      Physical Exam    General: Fatigued appearing elderly male Cor: JVP to midneck. Regular rate & rhythm. No murmurs. Lungs: clear Abdomen: soft, nontender, nondistended. Extremities: no edema Neuro: alert & orientedx3. Affect pleasant    Telemetry   2:1 heart block 40s  Medications:     Scheduled Medications:  aspirin EC  81 mg Oral Daily   atorvastatin  80 mg Oral Daily   Chlorhexidine  Gluconate Cloth  6 each Topical Daily   clopidogrel  75 mg Oral Daily   enoxaparin  (LOVENOX ) injection  30 mg Subcutaneous Daily   furosemide  80 mg Intravenous Once   gabapentin   300 mg Oral BID   guaiFENesin  600 mg Oral BID   insulin  aspart  0-9 Units Subcutaneous TID WC   levothyroxine   75 mcg Oral Q0600   midodrine  10 mg Oral TID WC   pantoprazole  40 mg Oral Daily    sodium zirconium cyclosilicate  5 g Oral Q M,W,F   tamsulosin   0.4 mg Oral Daily    Infusions:  cefTRIAXone (ROCEPHIN)  IV Stopped (08/08/24 1704)   norepinephrine (LEVOPHED) Adult infusion Stopped (08/09/24 0946)    PRN Medications: acetaminophen , nitroGLYCERIN, ondansetron  (ZOFRAN ) IV, mouth rinse, oxyCODONE     Patient Profile   Patient is a 89 y.o. male with a PMH of CKD, HTN, lung cancer s/p lobectomy who presented with chest pain and complete heart block.  Assessment/Plan    Complete heart block: Currently 2:1 AVB, 40s. Etiology of CHB is inferior MI.   - It is possible that rhythm may improve since CHB triggered by inferior MI.  - EP to see patient.   Cardiogenic shock - In setting of MI - Echo as below - Now off vasopressors - Mildly up on exam. Give 80 mg lasix IV and assess response. Daily weights orders. Strict Is/Os. - GDMT limited by BP and renal function  Inferior MI: Troponin significantly elevated at 22K on arrival and downtrending. Suspect missed MI, no actionable STEMI on EKG, previously discussed with interventional and managed medically given his worsening CKD and age. Echo showed EF 50-55%, inferior akinesis, RV normal, PASP 57 mmHg.  Suspect RCA occlusion.   -  Prn nitroglycerin for chest pain - Off heparin - Continue aspirin/plavix  - Atorvastatin 80 daily.   AKI on CKD: Suspect due to shock as well as obstruction, urine output increased after Foley placement. Baseline creatinine 1.8.  - Creatinine 3>3.7>4.1 today, still making urine - Now on flomax . Remove foley. - Keep SBP > 100  Hypotension - Fever and leukocytosis resolved. Empiric ceftriaxone. Cultures negative so far. - Improved. Off pressors. Now on midodrine 10 TID  PT/OT consults   CRITICAL CARE Performed by: COLLETTA SHAVER N   Total critical care time: 15 minutes  Critical care time was exclusive of separately billable procedures and treating other patients.  Critical care  was necessary to treat or prevent imminent or life-threatening deterioration.  Critical care was time spent personally by me on the following activities: development of treatment plan with patient and/or surrogate as well as nursing, discussions with consultants, evaluation of patient's response to treatment, examination of patient, obtaining history from patient or surrogate, ordering and performing treatments and interventions, ordering and review of laboratory studies, ordering and review of radiographic studies, pulse oximetry and re-evaluation of patient's condition.     Length of Stay: 4  Lynae Pederson N, PA-C  08/09/2024, 1:04 PM  Advanced Heart Failure Team Pager [phone] (M-F; 7a - 5p)  Please contact [facility] for night-coverage after hours (5p -7a ) and weekends on [email or web address]

## 2024-08-09 NOTE — Consult Note (Addendum)
 ELECTROPHYSIOLOGY CONSULT NOTE    Patient ID: William Wells MRN: [id number], DOB/AGE: 1934-09-13 89 y.o.  Admit date: 08/05/2024 Date of Consult: 08/09/2024  Primary Physician: Shona Norleen PEDLAR, MD Primary Cardiologist: Vishnu P Mallipeddi, MD  Electrophysiologist: new    Patient Profile: William Wells is a 89 y.o. male with a history of Wenkebach, CKD, HTN, T2DM, CKD 3b, lung Ca s/p lobectomy (2021) who is being seen today for the evaluation of CHB at the request of Dr. Zenaida.  HPI:  William Wells is a 89 y.o. male with PMH as above who initially presented to [facility] 10/31 with exertional chest pressure that began 10 days prior to presentation. . 4 days prior to presentation, chest pressure was considerably worse and was happening at rest, accompanied with fatigue, LH.   At admission, he was having active chest pain and was in 2:1 HB. Trops significantly elevated 22k > 16k. Normal EF on bedside TTE. BP low 80-90s systolic with AKI. ACS protocol was initiated, was started on norepi and dopamine gtt. Chest pain and BP improved.   He has since been weaned from pressors.  Febrile 11/3 to 100.6 with leukocytosis (WBC 15). Blood cultures NG at 24h, and started on empiric abx.   Cr continues to worsen. He has foley in place with concerns for obstruction.   He is currently sitting in chair with daughter at bedside. He has mild cheset pain, denies chest pressure or palpitations. Has SOB with activity, but none while siting. Reduced appetite.      Labs Potassium4.7 (11/04 0325) Magnesium  2.0 (11/03 1044) Creatinine, ser  4.08* (11/04 0325) PLT  140* (11/04 0325) HGB  9.1* (11/04 0325) WBC 9.7 (11/04 0325)  .    Past Medical History:  Diagnosis Date   Acid reflux disease    Arthritis    Complication of anesthesia    appendix surgery (89 year old) - passed out once patient got back to floor unit   Diabetes mellitus    Dyspnea    Family history of adverse reaction to anesthesia     daughter states that she has a difficult time waking up   Hiatal hernia    High cholesterol    Hypertension    Hypothyroidism    Lung cancer (HCC)    s/p XI robotic assisted thoracoscopy for LU lobectomy 06/22/20   Monoclonal gammopathy of unknown significance (MGUS) 03/28/2020   Normocytic anemia 03/28/2020   Renal disorder    renal insufficiency   Type 2 diabetes mellitus (HCC)      Surgical History:  Past Surgical History:  Procedure Laterality Date   APPENDECTOMY     ARTERY BIOPSY Right 11/23/2023   Procedure: RIGHT BIOPSY TEMPORAL ARTERY;  Surgeon: Gretta Lonni PARAS, MD;  Location: [facility] OR;  Service: Vascular;  Laterality: Right;   BRONCHIAL BIOPSY  05/29/2020   Procedure: BRONCHIAL BIOPSIES;  Surgeon: Shelah Lamar RAMAN, MD;  Location: [facility] ENDOSCOPY;  Service: Pulmonary;;   BRONCHIAL BRUSHINGS  05/29/2020   Procedure: BRONCHIAL BRUSHINGS;  Surgeon: Shelah Lamar RAMAN, MD;  Location: [facility] ENDOSCOPY;  Service: Pulmonary;;   BRONCHIAL NEEDLE ASPIRATION BIOPSY  05/29/2020   Procedure: BRONCHIAL NEEDLE ASPIRATION BIOPSIES;  Surgeon: Shelah Lamar RAMAN, MD;  Location: [facility] ENDOSCOPY;  Service: Pulmonary;;   BRONCHIAL WASHINGS  05/29/2020   Procedure: BRONCHIAL WASHINGS;  Surgeon: Shelah Lamar RAMAN, MD;  Location: [facility] ENDOSCOPY;  Service: Pulmonary;;   CATARACT EXTRACTION W/PHACO Left 01/23/2023   Procedure: CATARACT EXTRACTION PHACO AND INTRAOCULAR  LENS PLACEMENT (IOC);  Surgeon: Harrie Agent, MD;  Location: [facility] ORS;  Service: Ophthalmology;  Laterality: Left;  CDE: 13.69   CATARACT EXTRACTION W/PHACO Right 02/20/2023   Procedure: CATARACT EXTRACTION PHACO AND INTRAOCULAR LENS PLACEMENT (IOC);  Surgeon: Harrie Agent, MD;  Location: [facility] ORS;  Service: Ophthalmology;  Laterality: Right;  CDE: 12.88   CERVICAL SPINE SURGERY     EYE SURGERY     strabismus   INTERCOSTAL NERVE BLOCK Left 06/22/2020   Procedure: INTERCOSTAL NERVE BLOCK;  Surgeon: Kerrin Elspeth BROCKS, MD;  Location: [facility] OR;  Service:  Thoracic;  Laterality: Left;   NODE DISSECTION Left 06/22/2020   Procedure: NODE DISSECTION;  Surgeon: Kerrin Elspeth BROCKS, MD;  Location: [facility] OR;  Service: Thoracic;  Laterality: Left;   TONSILLECTOMY     VIDEO BRONCHOSCOPY WITH ENDOBRONCHIAL NAVIGATION N/A 05/29/2020   Procedure: VIDEO BRONCHOSCOPY WITH ENDOBRONCHIAL NAVIGATION;  Surgeon: Shelah Lamar RAMAN, MD;  Location: [facility] ENDOSCOPY;  Service: Pulmonary;  Laterality: N/A;     Medications Prior to Admission  Medication Sig Dispense Refill Last Dose/Taking   acetaminophen  (TYLENOL ) 500 MG tablet Take 2 tablets (1,000 mg total) by mouth every 6 (six) hours. (Patient taking differently: Take 500 mg by mouth daily as needed for headache, moderate pain (pain score 4-6) or mild pain (pain score 1-3).) 30 tablet 0 Past Month   albuterol  (VENTOLIN  HFA) 108 (90 Base) MCG/ACT inhaler INAHLE 2 PUFFS BY MOUTH 4 TIMES A DAY AS NEEDED FOR COUGH/WHEEZING   Past Month   diclofenac Sodium (VOLTAREN) 1 % GEL Apply 1 Application topically daily as needed (pain).   Past Month   docusate sodium  (COLACE) 100 MG capsule Take 1 capsule (100 mg total) by mouth 2 (two) times daily as needed for mild constipation. (Patient taking differently: Take 100 mg by mouth 2 (two) times a week.) 10 capsule 0 Past Week   ferrous sulfate 325 (65 FE) MG tablet Take 325 mg by mouth every Monday, Wednesday, and Friday.   08/05/2024   gabapentin  (NEURONTIN ) 300 MG capsule Take 300 mg by mouth 3 (three) times daily.   08/05/2024   glipiZIDE  (GLUCOTROL  XL) 2.5 MG 24 hr tablet Take 1.25 mg by mouth daily as needed (blood sugar above 160).   08/03/2024   guaiFENesin (MUCINEX) 600 MG 12 hr tablet Take 600 mg by mouth 2 (two) times daily.   08/04/2024   HYDROcodone -acetaminophen  (NORCO) 7.5-325 MG tablet Take 1 tablet by mouth every 6 (six) hours as needed for moderate pain (pain score 4-6). (Patient taking differently: Take 1 tablet by mouth daily as needed for moderate pain (pain score 4-6)  or severe pain (pain score 7-10).) 10 tablet 0 08/04/2024   levothyroxine  (SYNTHROID ) 75 MCG tablet Take 75 mcg by mouth at bedtime.   08/04/2024   LOKELMA 5 g packet Take 5 g by mouth every Monday, Wednesday, and Friday.   08/03/2024   Multiple Vitamin (MULTIVITAMIN ADULT PO) Take 1 tablet by mouth at bedtime.   08/04/2024   Multiple Vitamins-Minerals (PRESERVISION AREDS PO) Take 1 tablet by mouth in the morning and at bedtime.   08/05/2024   pantoprazole (PROTONIX) 20 MG tablet Take 20 mg by mouth daily.   08/05/2024   rosuvastatin  (CRESTOR ) 40 MG tablet Take 40 mg by mouth daily.    08/05/2024   telmisartan (MICARDIS) 20 MG tablet Take 10 mg by mouth daily.   08/05/2024   VITAMIN D  PO Take 2 capsules by mouth at bedtime.   Taking  VITAMIN E PO Take 1 capsule by mouth at bedtime.   Taking   mupirocin ointment (BACTROBAN) 2 % Apply 1 Application topically 3 (three) times daily. (Patient not taking: Reported on 08/08/2024)   Not Taking   omeprazole (PRILOSEC) 20 MG capsule Take 20 mg by mouth daily. (Patient not taking: Reported on 08/08/2024)   Not Taking    Inpatient Medications:   aspirin EC  81 mg Oral Daily   atorvastatin  80 mg Oral Daily   Chlorhexidine  Gluconate Cloth  6 each Topical Daily   clopidogrel  75 mg Oral Daily   enoxaparin  (LOVENOX ) injection  30 mg Subcutaneous Daily   furosemide  80 mg Intravenous Once   gabapentin   300 mg Oral BID   guaiFENesin  600 mg Oral BID   insulin  aspart  0-9 Units Subcutaneous TID WC   levothyroxine   75 mcg Oral Q0600   midodrine  10 mg Oral TID WC   pantoprazole  40 mg Oral Daily   sodium zirconium cyclosilicate  5 g Oral Q M,W,F   tamsulosin   0.4 mg Oral Daily    Allergies: No Known Allergies  Family History  Problem Relation Age of Onset   Diabetes Mother    Cancer Father    Cancer Sister    Diabetes Brother    Cancer Brother    Diabetes Brother    Parkinson's disease Brother    Diabetes Brother      Physical Exam: Vitals:    08/09/24 1115 08/09/24 1130 08/09/24 1145 08/09/24 1200  BP: 125/77 (!) 122/46 (!) 111/45 (!) 106/46  Pulse: 70 63 66 60  Resp: 19 (!) 23 (!) 21 19  Temp:      TempSrc:      SpO2: 91% 93% 94% 94%  Weight:      Height:        GEN- ill-appearing, A&O x 3, normal affect, sitting up in chair HEENT: Normocephalic, atraumatic Lungs- diminished throughout, Normal effort.  Heart- Regularly irregular rate and rhythm, No M/G/R.  GI- firm but compressible, NT, ND.  Extremities- No clubbing, cyanosis, or edema   Radiology/Studies: ECHOCARDIOGRAM COMPLETE Result Date: 08/06/2024    ECHOCARDIOGRAM REPORT   Patient Name:   William Wells Date of Exam: 08/06/2024 Medical Rec #:  [id number]     Height:       68.0 in Accession #:    [id number]    Weight:       178.0 lb Date of Birth:  04-20-1934      BSA:          1.945 m Patient Age:    89 years      BP:           96/51 mmHg Patient Gender: M             HR:           48 bpm. Exam Location:  Inpatient Procedure: 2D Echo (Both Spectral and Color Flow Doppler were utilized during            procedure). Indications:    myocardial infarct  History:        Patient has prior history of Echocardiogram examinations, most                 recent 09/17/2023. Chronic kidney disease, Arrythmias:second                 degree av block; Risk Factors:Dyslipidemia.  Sonographer:    Tinnie Barefoot  RDCS Referring Phys: [id number] [facility] P MALLIPEDDI  Sonographer Comments: Image acquisition challenging due to respiratory motion. IMPRESSIONS  1. Left ventricular ejection fraction, by estimation, is 50 to 55%. The left ventricle has low normal function. The left ventricle demonstrates regional wall motion abnormalities (see scoring diagram/findings for description). Left ventricular diastolic  parameters are consistent with Grade I diastolic dysfunction (impaired relaxation).  2. Right ventricular systolic function is normal. The right ventricular size is normal. There is moderately  elevated pulmonary artery systolic pressure. The estimated right ventricular systolic pressure is 56.6 mmHg.  3. The mitral valve is normal in structure. Mild mitral valve regurgitation. No evidence of mitral stenosis.  4. The aortic valve is normal in structure. There is mild calcification of the aortic valve. Aortic valve regurgitation is not visualized. Aortic valve sclerosis is present, with no evidence of aortic valve stenosis.  5. The inferior vena cava is normal in size with greater than 50% respiratory variability, suggesting right atrial pressure of 3 mmHg. FINDINGS  Left Ventricle: Left ventricular ejection fraction, by estimation, is 50 to 55%. The left ventricle has low normal function. The left ventricle demonstrates regional wall motion abnormalities. The left ventricular internal cavity size was normal in size. There is no left ventricular hypertrophy. Left ventricular diastolic parameters are consistent with Grade I diastolic dysfunction (impaired relaxation).  LV Wall Scoring: The inferior wall is akinetic. Right Ventricle: The right ventricular size is normal. No increase in right ventricular wall thickness. Right ventricular systolic function is normal. There is moderately elevated pulmonary artery systolic pressure. The tricuspid regurgitant velocity is 3.66 m/s, and with an assumed right atrial pressure of 3 mmHg, the estimated right ventricular systolic pressure is 56.6 mmHg. Left Atrium: Left atrial size was normal in size. Right Atrium: Right atrial size was normal in size. Pericardium: There is no evidence of pericardial effusion. Mitral Valve: The mitral valve is normal in structure. Mild mitral valve regurgitation. No evidence of mitral valve stenosis. Tricuspid Valve: The tricuspid valve is normal in structure. Tricuspid valve regurgitation is trivial. No evidence of tricuspid stenosis. Aortic Valve: The aortic valve is normal in structure. There is mild calcification of the aortic valve.  Aortic valve regurgitation is not visualized. Aortic valve sclerosis is present, with no evidence of aortic valve stenosis. Pulmonic Valve: The pulmonic valve was normal in structure. Pulmonic valve regurgitation is trivial. No evidence of pulmonic stenosis. Aorta: The aortic root is normal in size and structure. Venous: The inferior vena cava is normal in size with greater than 50% respiratory variability, suggesting right atrial pressure of 3 mmHg. IAS/Shunts: No atrial level shunt detected by color flow Doppler.  LEFT VENTRICLE PLAX 2D LVIDd:         4.90 cm   Diastology LVIDs:         3.40 cm   LV e' medial:    8.49 cm/s LV PW:         0.80 cm   LV E/e' medial:  19.8 LV IVS:        1.20 cm   LV e' lateral:   9.36 cm/s LVOT diam:     2.00 cm   LV E/e' lateral: 17.9 LVOT Area:     3.14 cm LV IVRT:       359 msec  RIGHT VENTRICLE             IVC RV Basal diam:  2.90 cm     IVC diam: 2.30 cm RV S prime:  19.60 cm/s TAPSE (M-mode): 2.5 cm LEFT ATRIUM             Index        RIGHT ATRIUM           Index LA diam:        4.30 cm 2.21 cm/m   RA Area:     16.30 cm LA Vol (A2C):   54.9 ml 28.23 ml/m  RA Volume:   41.80 ml  21.49 ml/m LA Vol (A4C):   61.9 ml 31.83 ml/m LA Biplane Vol: 61.0 ml 31.36 ml/m   AORTA Ao Root diam: 3.30 cm Ao Asc diam:  3.40 cm MITRAL VALVE                  TRICUSPID VALVE MV Area (PHT): 3.20 cm       TR Peak grad:   53.6 mmHg MV Decel Time: 237 msec       TR Vmax:        366.00 cm/s MR Peak grad:    105.7 mmHg MR Mean grad:    66.0 mmHg    SHUNTS MR Vmax:         514.00 cm/s  Systemic Diam: 2.00 cm MR Vmean:        377.0 cm/s MR PISA:         3.08 cm MR PISA Eff ROA: 23 mm MR PISA Radius:  0.70 cm MV E velocity: 168.00 cm/s MV A velocity: 141.00 cm/s MV E/A ratio:  1.19 William Parchment MD Electronically signed by William Parchment MD Signature Date/Time: 08/06/2024/5:03:57 PM    Final    US  EKG SITE RITE Result Date: 08/06/2024 If Site Rite image not attached, placement could not be  confirmed due to current cardiac rhythm.  CT Cervical Spine Wo Contrast Result Date: 08/05/2024 CLINICAL DATA:  Clemens out of bed, weakness EXAM: CT CERVICAL SPINE WITHOUT CONTRAST TECHNIQUE: Multidetector CT imaging of the cervical spine was performed without intravenous contrast. Multiplanar CT image reconstructions were also generated. RADIATION DOSE REDUCTION: This exam was performed according to the departmental dose-optimization program which includes automated exposure control, adjustment of the mA and/or kV according to patient size and/or use of iterative reconstruction technique. COMPARISON:  04/05/2020 FINDINGS: Alignment: Mild degenerative anterolisthesis of C6 on C7. Skull base and vertebrae: No acute fracture. No primary bone lesion or focal pathologic process. Soft tissues and spinal canal: No prevertebral fluid or swelling. No visible canal hematoma. Disc levels: Previous laminectomy spanning C3 through C6. There is bony fusion across the C3-4 and C4-5 disc spaces. There is severe spondylosis at C5-6 and C6-7. Upper chest: Airway is patent. Emphysematous changes are seen at the lung apices. Other: Reconstructed images demonstrate no additional findings. IMPRESSION: 1. No acute cervical spine fracture. 2. Multilevel postsurgical and degenerative changes as above. Electronically Signed   By: William Wells M.D.   On: 08/05/2024 19:44   CT CHEST ABDOMEN PELVIS WO CONTRAST Result Date: 08/05/2024 CLINICAL DATA:  Chest trauma fall left lower quadrant pain EXAM: CT CHEST, ABDOMEN AND PELVIS WITHOUT CONTRAST TECHNIQUE: Multidetector CT imaging of the chest, abdomen and pelvis was performed following the standard protocol without IV contrast. RADIATION DOSE REDUCTION: This exam was performed according to the departmental dose-optimization program which includes automated exposure control, adjustment of the mA and/or kV according to patient size and/or use of iterative reconstruction technique.  COMPARISON:  CT 03/03/2024, 12/02/2023, PET CT 04/09/2020 FINDINGS: CT CHEST FINDINGS Cardiovascular: Limited assessment without intravenous contrast.  Advanced aortic atherosclerosis without aneurysm. Multi vessel coronary vascular calcification. Normal cardiac size. No pericardial effusion Mediastinum/Nodes: Patent trachea. No thyroid  mass. No suspicious lymph nodes. Esophagus shows small hiatal hernia Lungs/Pleura: Advanced emphysema. Status post left upper lobectomy. No acute airspace disease. Volume loss left hemithorax. Small chronic thick-walled pleural collection. Questionable nodular slightly dense area within the left pleural collection measuring 12 mm but there is artifact. Musculoskeletal: Sternum appears intact. There are degenerative changes. No acute osseous abnormality CT ABDOMEN PELVIS FINDINGS Hepatobiliary: No focal liver abnormality is seen. No gallstones, gallbladder wall thickening, or biliary dilatation. Pancreas: Unremarkable. No pancreatic ductal dilatation or surrounding inflammatory changes. Spleen: Normal in size without focal abnormality. Adrenals/Urinary Tract: Adrenal glands are normal. No hydronephrosis. Renal cysts for which no imaging follow-up is recommended. Slightly thick-walled appearance of the urinary bladder. Stomach/Bowel: Decompressed stomach. Gastric wall thickening up to 4.3 cm. No dilated small bowel. 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 Vascular/Lymphatic: Aortic atherosclerosis. No enlarged abdominal or pelvic lymph nodes. Reproductive: Enlarged prostate Other: Negative for pelvic effusion or free air. Small fat containing right inguinal hernia Musculoskeletal: Multilevel degenerative changes. No acute osseous abnormality. IMPRESSION: 1. No CT evidence for acute intrathoracic, intra-abdominal, or intrapelvic abnormality allowing for absence of contrast. 2. Status post left upper lobectomy. Small chronic thick-walled left pleural collection.  Questionable nodular slightly dense area within the left pleural collection measuring 12 mm but there is artifact. Short-term follow-up chest CT with contrast is suggested to ascertain if this finding persists. 3. Advanced emphysema. 4. Diffuse gastric wall thickening up to 4.3 cm maximum, recommend correlation with endoscopy. 5. 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 6. Aortic atherosclerosis. Aortic Atherosclerosis (ICD10-I70.0) and Emphysema (ICD10-J43.9). Electronically Signed   By: William Wells M.D.   On: 08/05/2024 18:47   CT Head Wo Contrast Result Date: 08/05/2024 EXAM: CT HEAD WITHOUT CONTRAST 08/05/2024 06:22:00 PM TECHNIQUE: CT of the head was performed without the administration of intravenous contrast. Automated exposure control, iterative reconstruction, and/or weight based adjustment of the mA/kV was utilized to reduce the radiation dose to as low as reasonably achievable. COMPARISON: 09/06/2021 CLINICAL HISTORY: Polytrauma, blunt. FINDINGS: BRAIN AND VENTRICLES: Mild age-related volume loss. No acute hemorrhage. No evidence of acute infarct. No hydrocephalus. No extra-axial collection. No mass effect or midline shift. ORBITS: No acute abnormality. SINUSES: No acute abnormality. SOFT TISSUES AND SKULL: No acute soft tissue abnormality. No skull fracture. IMPRESSION: 1. No acute intracranial abnormality. Electronically signed by: William Crease MD 08/05/2024 06:26 PM EDT RP Workstation: [id number]   DG Chest Portable 1 View Result Date: 08/05/2024 EXAM: 1 VIEW(S) XRAY OF THE CHEST 08/05/2024 04:32:00 PM COMPARISON: Comparison 06/18/2021. CLINICAL HISTORY: infectious workup FINDINGS: LUNGS AND PLEURA: Stable postoperative changes are noted in the left lung base. No focal pulmonary opacity. No pulmonary edema. No pleural effusion. No pneumothorax. HEART AND MEDIASTINUM: Stable mediastinal shift to the left. No acute abnormality of the cardiac silhouette. BONES AND SOFT  TISSUES: No acute osseous abnormality. IMPRESSION: 1. Stable postoperative changes in the left lung base with persistent mediastinal shift to the left. 2. No acute cardiopulmonary abnormality identified. Electronically signed by: William Seip MD 08/05/2024 04:56 PM EDT RP Workstation: [id number]    EKG: 11/23/2023 - wenkebach, rate 60 07/01/2024 - wenkebah with a 1st deg block, rate 57 08/05/2024 - CHB at 54, narrow QRS at 92 08/07/2024 at 732 - 2:1 HB at 50, QRS  (personally reviewed)  TELEMETRY: intermittent 2:1 HB with wenkebach, rates 40-60s (personally  reviewed)  DEVICE HISTORY: none  Assessment/Plan: #) CHB with known intermittent wenckebach #) cardiogenic shock #) late-presenting MI, suspected #) AKI on CKD #) febrile  Patient presented to [facility] ER 10/31 with exertional chest pressure for 10 days, that was worsening which prompted ER presentation. Trops significantly elevated with subtle ST elevation. He was in CHB on presentation with low BP Initially on pressors, have since been weaned TTE with normal LVEF with akinetic inferior wall Kidney function continues to worsen with Cr today at 4, 3.68 yesterday. Baseline Cr ~2 Tele continues to show intermittent 2:1 HB with wenkebach, rates 40-70s Conduction abnormalites likely d/t late-presenting MI. Appears to be slowly improving.  Do not recommend PPM at this time, EP William Wells continue to follow clinical course        For questions or updates, please contact [facility] Please consult [email or web address] for contact info under Cardiology/STEMI.  Signed, Suzann Riddle, NP  08/09/2024 1:44 PM     William Wells was seen by me today along with Elvie Needle. I have personally performed an evaluation on this patient.  My findings are as follows: 89 y.o. male patient with a past history as above.  He presented to the hospital complaining of chest pain.  He was found to be in 2-1 AV block.  Troponins were significantly  elevated.  He had to have exertional chest pain for 10 days prior to admission.  4 days prior to admission, chest pressure became considerably worse and was occurring at rest accompanied by fatigue and lightheadedness.  Patient was found to have a normal ejection fraction with an inferior wall motion abnormality.  He was initially on both norepinephrine and dopamine, but these have both been weaned off.  He was febrile 08/09/2019 5-100.6 with an elevated white count of 15.  This has since resolved.  He continues to be in 2-1 and Mobitz 1 AV block.  He feels tired, fatigued sitting in a chair.  Today is the first day that he has been out of bed.  Data: EKG(s) and pertinent labs, studies, etc were personally reviewed and interpreted by me:  EKG, telemetry, labs Otherwise, I agree with data as outlined by the advanced practice provider.  Exam performed by me: Gen: No acute distress Neck: + JVD Cardiac: Irregular Lungs: Clear Extremities: No edema  My Assessment and Plan: 1.  Second-degree AV block: Presented with complete heart block and an inferior MI.  He has had improvement in his conduction.  He is having intermittent episodes of 2 1 and Mobitz 1 AV block.  As his conduction has improved, would hold off on pacemaker implant for now.  Additionally, he is in acute renal failure.  It is likely that his conduction system Erinn Mendosa continue to improve.  Hopefully we can avoid pacemaker implant.  2.  Inferior MI: Suspect RCA occlusion based on EKG and echo results.  Is not on heparin.  Plavix is being held for the possibility of pacemaker implant.  Continue aspirin.  Further plan per primary team.  3.  Acute on chronic renal failure: Creatinine 4.1 today.  Still making urine.  Plan per primary team.  4.  Cardiogenic shock: Occurred in the setting of MI.  Plan per heart failure cardiology  Signed,  Macyn Shropshire Gladis Norton, MD  08/09/2024 3:29 PM

## 2024-08-09 NOTE — TOC Initial Note (Signed)
 Transition of Care [facility]) - Initial/Assessment Note    Patient Details  Name: William Wells MRN: [id number] Date of Birth: [birth year]  Transition of Care [facility] CM/SW Contact:    Justina Delcia Czar, RN Phone Number: [phone] 08/09/2024, 4:45 PM  Clinical Narrative:                 Spoke to pt's dtr, Verneita. Pt currently lives with dtr/SIL and they assist him in the home. Takes pt to appts. Pt has a RW at home.   Chart reviewed for discharge readiness, patient not medically stable for d/c. Inpatient CM/CSW will continue to monitor pt's advancement through interdisciplinary progression rounds.   If new pt transition needs arise, MD please place a TOC consult.    Expected Discharge Plan: IP Rehab Facility Barriers to Discharge: Continued Medical Work up   Patient Goals and CMS Choice            Expected Discharge Plan and Services   Discharge Planning Services: CM Consult   Living arrangements for the past 2 months: Single Family Home                                      Prior Living Arrangements/Services Living arrangements for the past 2 months: Single Family Home Lives with:: Adult Children              Current home services: DME    Activities of Daily Living   ADL Screening (condition at time of admission) Independently performs ADLs?: Yes (appropriate for developmental age) Is the patient deaf or have difficulty hearing?: No Does the patient have difficulty seeing, even when wearing glasses/contacts?: No Does the patient have difficulty concentrating, remembering, or making decisions?: No  Permission Sought/Granted                  Emotional Assessment              Admission diagnosis:  Unstable angina pectoris (HCC) [I20.0] Elevated troponin [R79.89] AKI (acute kidney injury) [N17.9] Hypotensive episode [I95.9] Fall, initial encounter [W19.XXXA] Acute ST elevation myocardial infarction (STEMI) of inferior wall (HCC)  [I21.19] Patient Active Problem List   Diagnosis Date Noted   Acute ST elevation myocardial infarction (STEMI) of inferior wall (HCC) 08/05/2024   Radiculopathy, lumbar region 06/08/2024   DOE (dyspnea on exertion) 01/05/2024   HLD (hyperlipidemia) 01/05/2024   Second degree AV block, Mobitz type I 01/05/2024   Headache 11/10/2023   Adenocarcinoma of left lung, stage 1 (HCC) 06/26/2020   Status post robot-assisted surgical procedure 06/22/2020   S/P robot-assisted surgical procedure 06/22/2020   Mass of upper lobe of left lung 04/11/2020   Monoclonal gammopathy of unknown significance (MGUS) 03/28/2020   Normocytic anemia 03/28/2020   Hyperkalemia 07/15/2019   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 [facility] [place] [address] Phone: [phone] Fax: [phone]     Social Drivers of Health (SDOH) Social History: SDOH Screenings   Food Insecurity: No Food Insecurity (08/06/2024)  Housing: Low Risk  (08/06/2024)  Transportation Needs: No Transportation Needs (08/06/2024)  Utilities: Not At Risk (08/06/2024)  Alcohol Screen: Low Risk  (10/25/2020)  Depression (  PHQ2-9): Low Risk  (04/19/2024)  Financial Resource Strain: Low Risk  (10/25/2020)  Physical Activity: Sufficiently Active (10/25/2020)  Social Connections: Moderately Isolated (08/06/2024)  Stress: No Stress Concern Present (10/25/2020)  Tobacco Use: Medium Risk (08/06/2024)   SDOH Interventions:     Readmission Risk Interventions     No data to display

## 2024-08-10 DIAGNOSIS — I441 Atrioventricular block, second degree: Secondary | ICD-10-CM | POA: Diagnosis not present

## 2024-08-10 DIAGNOSIS — I442 Atrioventricular block, complete: Secondary | ICD-10-CM | POA: Diagnosis not present

## 2024-08-10 DIAGNOSIS — I2119 ST elevation (STEMI) myocardial infarction involving other coronary artery of inferior wall: Secondary | ICD-10-CM | POA: Diagnosis not present

## 2024-08-10 DIAGNOSIS — R570 Cardiogenic shock: Secondary | ICD-10-CM | POA: Diagnosis not present

## 2024-08-10 DIAGNOSIS — N179 Acute kidney failure, unspecified: Secondary | ICD-10-CM | POA: Diagnosis not present

## 2024-08-10 LAB — CBC
HCT: 26.2 % — ABNORMAL LOW (ref 39.0–52.0)
Hemoglobin: 9 g/dL — ABNORMAL LOW (ref 13.0–17.0)
MCH: 30.3 pg (ref 26.0–34.0)
MCHC: 34.4 g/dL (ref 30.0–36.0)
MCV: 88.2 fL (ref 80.0–100.0)
Platelets: 130 K/uL — ABNORMAL LOW (ref 150–400)
RBC: 2.97 MIL/uL — ABNORMAL LOW (ref 4.22–5.81)
RDW: 14.6 % (ref 11.5–15.5)
WBC: 7.4 K/uL (ref 4.0–10.5)
nRBC: 0 % (ref 0.0–0.2)

## 2024-08-10 LAB — URINALYSIS, ROUTINE W REFLEX MICROSCOPIC
Bilirubin Urine: NEGATIVE
Glucose, UA: 150 mg/dL — AB
Ketones, ur: NEGATIVE mg/dL
Leukocytes,Ua: NEGATIVE
Nitrite: NEGATIVE
Protein, ur: NEGATIVE mg/dL
Specific Gravity, Urine: 1.008 (ref 1.005–1.030)
pH: 5 (ref 5.0–8.0)

## 2024-08-10 LAB — COMPREHENSIVE METABOLIC PANEL WITH GFR
ALT: 218 U/L — ABNORMAL HIGH (ref 0–44)
AST: 216 U/L — ABNORMAL HIGH (ref 15–41)
Albumin: 1.8 g/dL — ABNORMAL LOW (ref 3.5–5.0)
Alkaline Phosphatase: 96 U/L (ref 38–126)
Anion gap: 13 (ref 5–15)
BUN: 72 mg/dL — ABNORMAL HIGH (ref 8–23)
CO2: 19 mmol/L — ABNORMAL LOW (ref 22–32)
Calcium: 7.7 mg/dL — ABNORMAL LOW (ref 8.9–10.3)
Chloride: 97 mmol/L — ABNORMAL LOW (ref 98–111)
Creatinine, Ser: 4.2 mg/dL — ABNORMAL HIGH (ref 0.61–1.24)
GFR, Estimated: 13 mL/min — ABNORMAL LOW (ref >60)
Glucose, Bld: 241 mg/dL — ABNORMAL HIGH (ref 70–99)
Potassium: 3.9 mmol/L (ref 3.5–5.1)
Sodium: 129 mmol/L — ABNORMAL LOW (ref 135–145)
Total Bilirubin: 0.6 mg/dL (ref 0.0–1.2)
Total Protein: 3.6 g/dL — ABNORMAL LOW (ref 6.5–8.1)

## 2024-08-10 LAB — BASIC METABOLIC PANEL WITH GFR
Anion gap: 13 (ref 5–15)
BUN: 72 mg/dL — ABNORMAL HIGH (ref 8–23)
CO2: 21 mmol/L — ABNORMAL LOW (ref 22–32)
Calcium: 8.1 mg/dL — ABNORMAL LOW (ref 8.9–10.3)
Chloride: 95 mmol/L — ABNORMAL LOW (ref 98–111)
Creatinine, Ser: 3.99 mg/dL — ABNORMAL HIGH (ref 0.61–1.24)
GFR, Estimated: 14 mL/min — ABNORMAL LOW (ref >60)
Glucose, Bld: 258 mg/dL — ABNORMAL HIGH (ref 70–99)
Potassium: 3.7 mmol/L (ref 3.5–5.1)
Sodium: 129 mmol/L — ABNORMAL LOW (ref 135–145)

## 2024-08-10 LAB — GLUCOSE, CAPILLARY
Glucose-Capillary: 174 mg/dL — ABNORMAL HIGH (ref 70–99)
Glucose-Capillary: 381 mg/dL — ABNORMAL HIGH (ref 70–99)
Glucose-Capillary: 249 mg/dL — ABNORMAL HIGH (ref 70–99)
Glucose-Capillary: 220 mg/dL — ABNORMAL HIGH (ref 70–99)

## 2024-08-10 LAB — NA AND K (SODIUM & POTASSIUM), RAND UR
Potassium Urine: 12 mmol/L
Sodium, Ur: 63 mmol/L

## 2024-08-10 LAB — CREATININE, URINE, RANDOM: Creatinine, Urine: 30 mg/dL

## 2024-08-10 MED ORDER — INSULIN ASPART 100 UNIT/ML IJ SOLN
0.0000 [IU] | Freq: Every day | INTRAMUSCULAR | Status: DC
  Administered 2024-08-10 – 2024-08-15 (×4): 2 [IU] via SUBCUTANEOUS
  Administered 2024-08-16: 4 [IU] via SUBCUTANEOUS
  Administered 2024-08-17: 2 [IU] via SUBCUTANEOUS
  Administered 2024-08-18: 3 [IU] via SUBCUTANEOUS
  Administered 2024-08-19: 2 [IU] via SUBCUTANEOUS
  Administered 2024-08-20: 3 [IU] via SUBCUTANEOUS
  Filled 2024-08-10: qty 3
  Filled 2024-08-10 (×4): qty 2
  Filled 2024-08-10: qty 3
  Filled 2024-08-10: qty 4
  Filled 2024-08-10 (×2): qty 2

## 2024-08-10 MED ORDER — FUROSEMIDE 10 MG/ML IJ SOLN
80.0000 mg | Freq: Once | INTRAMUSCULAR | Status: AC
  Administered 2024-08-10: 80 mg via INTRAVENOUS
  Filled 2024-08-10: qty 8

## 2024-08-10 MED ORDER — INSULIN ASPART 100 UNIT/ML IJ SOLN
0.0000 [IU] | Freq: Three times a day (TID) | INTRAMUSCULAR | Status: DC
  Administered 2024-08-10: 5 [IU] via SUBCUTANEOUS
  Administered 2024-08-10: 15 [IU] via SUBCUTANEOUS
  Administered 2024-08-11: 8 [IU] via SUBCUTANEOUS
  Administered 2024-08-11: 2 [IU] via SUBCUTANEOUS
  Administered 2024-08-11: 3 [IU] via SUBCUTANEOUS
  Administered 2024-08-12: 5 [IU] via SUBCUTANEOUS
  Administered 2024-08-12 (×2): 8 [IU] via SUBCUTANEOUS
  Administered 2024-08-13: 3 [IU] via SUBCUTANEOUS
  Administered 2024-08-13: 5 [IU] via SUBCUTANEOUS
  Administered 2024-08-13: 3 [IU] via SUBCUTANEOUS
  Administered 2024-08-14: 5 [IU] via SUBCUTANEOUS
  Administered 2024-08-14 – 2024-08-15 (×3): 3 [IU] via SUBCUTANEOUS
  Administered 2024-08-15 – 2024-08-16 (×2): 5 [IU] via SUBCUTANEOUS
  Administered 2024-08-16: 3 [IU] via SUBCUTANEOUS
  Administered 2024-08-16: 5 [IU] via SUBCUTANEOUS
  Administered 2024-08-17: 3 [IU] via SUBCUTANEOUS
  Administered 2024-08-17: 5 [IU] via SUBCUTANEOUS
  Administered 2024-08-17 – 2024-08-18 (×2): 2 [IU] via SUBCUTANEOUS
  Administered 2024-08-19: 11 [IU] via SUBCUTANEOUS
  Administered 2024-08-19: 3 [IU] via SUBCUTANEOUS
  Administered 2024-08-20 (×2): 2 [IU] via SUBCUTANEOUS
  Administered 2024-08-20: 3 [IU] via SUBCUTANEOUS
  Administered 2024-08-21: 11 [IU] via SUBCUTANEOUS
  Administered 2024-08-21: 2 [IU] via SUBCUTANEOUS
  Administered 2024-08-22 – 2024-08-23 (×3): 3 [IU] via SUBCUTANEOUS
  Administered 2024-08-23: 2 [IU] via SUBCUTANEOUS
  Filled 2024-08-10: qty 5
  Filled 2024-08-10: qty 3
  Filled 2024-08-10: qty 2
  Filled 2024-08-10: qty 3
  Filled 2024-08-10: qty 5
  Filled 2024-08-10: qty 8
  Filled 2024-08-10: qty 3
  Filled 2024-08-10: qty 2
  Filled 2024-08-10: qty 5
  Filled 2024-08-10: qty 8
  Filled 2024-08-10 (×2): qty 2
  Filled 2024-08-10: qty 3
  Filled 2024-08-10: qty 8
  Filled 2024-08-10: qty 2
  Filled 2024-08-10: qty 3
  Filled 2024-08-10: qty 5
  Filled 2024-08-10: qty 2
  Filled 2024-08-10: qty 3
  Filled 2024-08-10: qty 11
  Filled 2024-08-10: qty 15
  Filled 2024-08-10: qty 5
  Filled 2024-08-10: qty 2
  Filled 2024-08-10 (×2): qty 5
  Filled 2024-08-10: qty 3
  Filled 2024-08-10: qty 8
  Filled 2024-08-10: qty 3
  Filled 2024-08-10: qty 2
  Filled 2024-08-10: qty 3
  Filled 2024-08-10: qty 11
  Filled 2024-08-10: qty 3

## 2024-08-10 MED ORDER — POTASSIUM CHLORIDE 20 MEQ PO PACK: 20.0000 meq | PACK | Freq: Once | ORAL | Status: DC

## 2024-08-10 MED ORDER — HEPARIN SODIUM (PORCINE) 5000 UNIT/ML IJ SOLN
5000.0000 [IU] | Freq: Three times a day (TID) | INTRAMUSCULAR | Status: DC
  Administered 2024-08-11 – 2024-08-19 (×24): 5000 [IU] via SUBCUTANEOUS
  Filled 2024-08-10 (×24): qty 1

## 2024-08-10 NOTE — Consult Note (Signed)
 WOC Nurse Consult Note: Reason for Consult: pressure injuries Wound type: Stage 2 Pressure Injury; gluteal cleft  Pressure Injury; right buttock  Pressure Injury POA: No Measurement:see nursing flow sheets Wound bed: clean Drainage (amount, consistency, odor) see nrusing flow sheets Periwound:intact; dry Dressing procedure/placement/frequency: Cleanse both areas with saline, pat dry Cover each wound with single layer of xeroform, and foam  Change every other day Low air loss mattress in place while in the ICU for moisture management and pressure redistribution Turn and reposition per hospital policy    Re consult if needed, will not follow at this time. Thanks  Darbie [employer], RN,CWOCN, CNS, [employer] [phone]

## 2024-08-10 NOTE — Evaluation (Signed)
 Physical Therapy Evaluation Patient Details Name: William Wells MRN: [id number] DOB: August 12, 1934 Today's Date: 08/10/2024  History of Present Illness  Pt is an 89 y/o M presenting to ED on 10/31 wtih chest pressure, generalized weakness, fall, CT negative, EKG with ST elevation, evidence of AV block, suspect missing MI. Plan for possible PPM. Also with AKI. PMH includes lung CA s/p robotic assisted upper lobectomy, HTN, DM2, CKD IIIB  Clinical Impression  Pt admitted with above diagnosis. Pt was fatigued after sitting in chair all of am. PT performed bed level eval and assisted nurse with rolling pt for nurse to check pts skin. Pt deconditioned and fatigues quickly. Pt has supportive family. They are hopeful pt can do post acute rehab > 3 hours day prior to d/c home.  Pt currently with functional limitations due to the deficits listed below (see PT Problem List). Pt will benefit from acute skilled PT to increase their independence and safety with mobility to allow discharge.           If plan is discharge home, recommend the following: A lot of help with walking and/or transfers;A lot of help with bathing/dressing/bathroom;Assistance with cooking/housework;Assist for transportation;Help with stairs or ramp for entrance   Can travel by private vehicle        Equipment Recommendations None recommended by PT  Recommendations for Other Services  Rehab consult    Functional Status Assessment Patient has had a recent decline in their functional status and demonstrates the ability to make significant improvements in function in a reasonable and predictable amount of time.     Precautions / Restrictions Precautions Precautions: Fall Precaution/Restrictions Comments: watch O2 Restrictions Weight Bearing Restrictions Per Provider Order: No      Mobility  Bed Mobility Overal bed mobility: Needs Assistance Bed Mobility: Rolling Rolling: Min assist         General bed mobility comments: Pt  had just gotten back to bed on arrival.  Pt's nurse asked PT to assist in rolling as they needed to assess pts skin on bottom. Nurse aware of redness on pts buttocks.  Went and got geomat for pt for next time pt is up.    Transfers                   General transfer comment: Pt had just been assisted back to bed. Refused to get back up.    Ambulation/Gait                  Stairs            Wheelchair Mobility     Tilt Bed    Modified Rankin (Stroke Patients Only)       Balance Overall balance assessment: Needs assistance Sitting-balance support: Feet supported, No upper extremity supported Sitting balance-Leahy Scale: Good                                       Pertinent Vitals/Pain Pain Assessment Pain Assessment: Faces Faces Pain Scale: Hurts even more Pain Location: L hand at IV site Pain Descriptors / Indicators: Discomfort Pain Intervention(s): Limited activity within patient's tolerance, Monitored during session, Repositioned, Premedicated before session    Home Living Family/patient expects to be discharged to:: Private residence Living Arrangements: Children (daughter) Available Help at Discharge: Family;Available PRN/intermittently Type of Home: House Home Access: Ramped entrance       Home Layout:  One level Home Equipment: Shower seat - built in;Cane - single point;Rollator (4 wheels)      Prior Function Prior Level of Function : Independent/Modified Independent             Mobility Comments: Uses rollator for longer distances, prn use of cane in home ADLs Comments: Ind, daughter manages meds     Extremity/Trunk Assessment   Upper Extremity Assessment Upper Extremity Assessment: Defer to OT evaluation    Lower Extremity Assessment Lower Extremity Assessment: Generalized weakness    Cervical / Trunk Assessment Cervical / Trunk Assessment: Kyphotic  Communication   Communication Communication: No  apparent difficulties    Cognition Arousal: Alert Behavior During Therapy: WFL for tasks assessed/performed   PT - Cognitive impairments: No apparent impairments                         Following commands: Intact       Cueing Cueing Techniques: Verbal cues     General Comments General comments (skin integrity, edema, etc.): VSS    Exercises General Exercises - Lower Extremity Ankle Circles/Pumps: AROM, Both, 10 reps, Supine Quad Sets: AROM, Both, 5 reps, Supine Long Arc Quad: AROM, Both, 10 reps, Other (comment) (chair position) Heel Slides: AROM, Both, 5 reps, Supine   Assessment/Plan    PT Assessment Patient needs continued PT services  PT Problem List Decreased activity tolerance;Decreased balance;Decreased mobility;Decreased knowledge of use of DME;Decreased safety awareness;Decreased knowledge of precautions;Cardiopulmonary status limiting activity;Decreased skin integrity       PT Treatment Interventions DME instruction;Gait training;Functional mobility training;Therapeutic activities;Therapeutic exercise;Balance training;Patient/family education    PT Goals (Current goals can be found in the Care Plan section)  Acute Rehab PT Goals Patient Stated Goal: to go home PT Goal Formulation: With patient Time For Goal Achievement: 08/24/24 Potential to Achieve Goals: Fair    Frequency Min 2X/week     Co-evaluation               AM-PAC PT 6 Clicks Mobility  Outcome Measure Help needed turning from your back to your side while in a flat bed without using bedrails?: A Little Help needed moving from lying on your back to sitting on the side of a flat bed without using bedrails?: A Little Help needed moving to and from a bed to a chair (including a wheelchair)?: Total Help needed standing up from a chair using your arms (e.g., wheelchair or bedside chair)?: Total Help needed to walk in hospital room?: Total Help needed climbing 3-5 steps with a  railing? : Total 6 Click Score: 10    End of Session Equipment Utilized During Treatment: Gait belt;Oxygen Activity Tolerance: Patient limited by fatigue Patient left: in bed;with call bell/phone within reach;with family/visitor present (in chair position) Nurse Communication: Mobility status PT Visit Diagnosis: Muscle weakness (generalized) (M62.81)    Time: 8869-8846 PT Time Calculation (min) (ACUTE ONLY): 23 min   Charges:   PT Evaluation $PT Eval Moderate Complexity: 1 Mod PT Treatments $Therapeutic Exercise: 8-22 mins PT General Charges $$ ACUTE PT VISIT: 1 Visit         Patrisha Hausmann M,PT Acute Rehab Services [phone]   Stephane JULIANNA Bevel 08/10/2024, 2:18 PM

## 2024-08-10 NOTE — Progress Notes (Signed)
 Patient ID: William Wells, male   DOB: July 30, 1934, 89 y.o.   MRN: [id number]     Advanced Heart Failure Rounding Note  Cardiologist: Diannah SHAUNNA Maywood, MD   Chief Complaint: Heart block  Subjective:    Off all pressors. Now on midodrine 10 mg TID.   Leukocytosis resolved. Now afebrile. Remains on empiric ceftriaxone. Cultures with no growth so far.   Scr 3>3.68>4.08>4.2.   Echo: EF 50-55%, inferior akinesis, RV normal, PASP 57 mmHg.   Sitting up in chair.  Objective:   Weight Range: 84.4 kg Body mass index is 28.29 kg/m.   Vital Signs:   Temp:  [97.6 F (36.4 C)-99.3 F (37.4 C)] 99.3 F (37.4 C) (11/05 0817) Pulse Rate:  [44-72] 58 (11/05 1000) Resp:  [14-26] 18 (11/05 1000) BP: (93-148)/(38-116) 113/47 (11/05 1000) SpO2:  [91 %-100 %] 96 % (11/05 1000) Weight:  [84.4 kg] 84.4 kg (11/05 0500) Last BM Date : 08/08/24  Weight change: Filed Weights   08/05/24 1535 08/09/24 0909 08/10/24 0500  Weight: 80.7 kg 84 kg 84.4 kg    Intake/Output:   Intake/Output Summary (Last 24 hours) at 08/10/2024 1055 Last data filed at 08/10/2024 0945 Gross per 24 hour  Intake 700 ml  Output 2645 ml  Net -1945 ml      Physical Exam    General:  elderly appearing.  No respiratory difficulty Neck: JVD ~8 cm.  Cor: Regular rate & rhythm. No murmurs. Lungs: clear Extremities: no edema  Neuro: alert & oriented x 3. Affect pleasant.  Telemetry   Wenckeback 70s, intt 2:1 / 3:1 HB (Personally reviewed)    Medications:     Scheduled Medications:  aspirin EC  81 mg Oral Daily   atorvastatin  80 mg Oral Daily   Chlorhexidine  Gluconate Cloth  6 each Topical Daily   furosemide  80 mg Intravenous Once   gabapentin   300 mg Oral BID   guaiFENesin  600 mg Oral BID   [START ON 08/11/2024] heparin injection (subcutaneous)  5,000 Units Subcutaneous Q8H   insulin  aspart  0-15 Units Subcutaneous TID WC   insulin  aspart  0-5 Units Subcutaneous QHS   levothyroxine   75 mcg Oral Q0600    midodrine  10 mg Oral TID WC   pantoprazole  40 mg Oral Daily   tamsulosin   0.4 mg Oral Daily    Infusions:  cefTRIAXone (ROCEPHIN)  IV Stopped (08/09/24 1640)   norepinephrine (LEVOPHED) Adult infusion Stopped (08/09/24 0946)    PRN Medications: acetaminophen , Gerhardt's butt cream, nitroGLYCERIN, ondansetron  (ZOFRAN ) IV, mouth rinse, oxyCODONE     Patient Profile   Patient is a 89 y.o. male with a PMH of CKD, HTN, lung cancer s/p lobectomy who presented with chest pain and complete heart block.  Assessment/Plan  Complete heart block: Currently 2:1 AVB, 40s. Etiology of CHB is inferior MI.   - It is possible that rhythm may improve since CHB triggered by inferior MI.  - EP following, plan for possible PPM  Cardiogenic shock - In setting of MI - Echo as below - Now off vasopressors - Mildly up on exam. Give 80 mg lasix IV and assess response. Responded well yesterday while foley in place. Daily weights ordered. Strict Is/Os. - GDMT limited by BP and renal function  Inferior MI: Troponin significantly elevated at 22K on arrival and downtrending. Suspect missed MI, no actionable STEMI on EKG, previously discussed with interventional and managed medically given his worsening CKD and age. Echo showed EF 50-55%, inferior  akinesis, RV normal, PASP 57 mmHg.  Suspect RCA occlusion.   - Prn nitroglycerin for chest pain - Off heparin - Continue aspirin/plavix  - Atorvastatin 80 daily.   AKI on CKD: Suspect due to shock as well as obstruction, urine output increased after Foley placement. Baseline creatinine 1.8.  - Creatinine 3>3.7>4.1>4.2 today, still making urine - Now on flomax . Foley removed yesterday. - Reached out to nephrology today. Plan to see later today - Bladder scan Q6 - Keep SBP > 100  Hypotension - Fever and leukocytosis resolved. Empiric ceftriaxone. Cultures negative so far. - Improved. Off pressors. Now on midodrine 10 TID  PT/OT consults   CRITICAL  CARE Performed by: Beckey LITTIE Coe   Total critical care time: 14 minutes  Critical care time was exclusive of separately billable procedures and treating other patients.  Critical care was necessary to treat or prevent imminent or life-threatening deterioration.  Critical care was time spent personally by me on the following activities: development of treatment plan with patient and/or surrogate as well as nursing, discussions with consultants, evaluation of patient's response to treatment, examination of patient, obtaining history from patient or surrogate, ordering and performing treatments and interventions, ordering and review of laboratory studies, ordering and review of radiographic studies, pulse oximetry and re-evaluation of patient's condition.     Length of Stay: 5  Beckey LITTIE Coe, NP  08/10/2024, 10:55 AM  Advanced Heart Failure Team Pager [phone] (M-F; 7a - 5p)  Please contact [facility] for night-coverage after hours (5p -7a ) and weekends on [email or web address]

## 2024-08-10 NOTE — Progress Notes (Addendum)
 Patient Name: William Wells Date of Encounter: 08/10/2024  Primary Cardiologist: Vishnu P Mallipeddi, MD Electrophysiologist: None  Interval Summary   NAEON.  Sitting in chair this morning, ate more breakfast this AM.   No acute complaints at this time.   Vital Signs    Vitals:   08/10/24 0800 08/10/24 0817 08/10/24 0900 08/10/24 0910  BP: (!) 119/48 (!) 118/43 (!) 111/53   Pulse: (!) 50 (!) 51 (!) 57 (!) 55  Resp: 15 19 16  (!) 23  Temp:  99.3 F (37.4 C)    TempSrc:  Oral    SpO2: 98% 97% 97% 98%  Weight:      Height:        Intake/Output Summary (Last 24 hours) at 08/10/2024 0947 Last data filed at 08/10/2024 0645 Gross per 24 hour  Intake 702.91 ml  Output 2620 ml  Net -1917.09 ml   Filed Weights   08/05/24 1535 08/09/24 0909 08/10/24 0500  Weight: 80.7 kg 84 kg 84.4 kg    Physical Exam    GEN- NAD, alert, ill-appearing. Laying in chair. Lungs- Clear to ausculation bilaterally, normal work of breathing Cardiac- Regular irregular rate and rhythm, no murmurs GI- soft, NT, ND, + BS Extremities- no clubbing or cyanosis. No edema  Telemetry    Rate 40-60s, mostly wenckebach with periods of 2:1 and 3:1 HB (personally reviewed)  Hospital Course    William Wells is a 89 y.o. male with PMH of Wenkebach, CKD, HTN, T2DM, CKD 3b, lung Ca s/p lobectomy (2021) admitted for chest pain, CHB  Assessment & Plan    #) Mobitz 1, chronic #) high grade HB, intermittent #) cardiogenic shock #) inferior MI #) acute on chronic renal failure #) elevated LFTs  Patient remains largely in mobitz 1 with intermittent high grade AV block. Rates generally 40-60 with stable QRS morphology Continues to have worsening Cr, now with elevated LFTs Unclear whether PPM would improve clinical outcome given his heart rates are generally 50+ with narrow QRS Plavix on hold in case PPM implant Cardiogenic shock, Appreciate HF's assistance  Kelci Petrella discuss further with MD       For  questions or updates, please contact [facility] Please consult [email or web address] for contact info under     Signed, Suzann Riddle, NP  08/10/2024, 9:47 AM     William Wells was seen by me today along with Suzann Riddle. I have personally performed an evaluation on this patient.  My findings are as follows: 89 y.o. male feels weak and fatigued.  Complains of neck pain..  Data: EKG(s) and pertinent labs, studies, etc were personally reviewed and interpreted by me:  Telemetry, EKG Otherwise, I agree with data as outlined by the advanced practice provider.  Exam performed by me: Gen: No acute distress Neck: no JVD Cardiac: Regular Lungs: Clear Extremities: No edema  My Assessment and Plan:  1.  Second-degree AV block 2.  Inferior MI 3.  Acute on chronic renal failure 4.  Elevated LFTs  Patient has intermittent 2-1 AV block and Mobitz 1 block.  His LFTs are elevated and his creatinine is climbing.  His heart rate is well-controlled, mainly in the 50s to 60s, and his blood pressure is not low.  I do not think that this is the cause of his elevated LFTs.  For now, we Samera Macy continue to monitor his heart rhythm.  As his labs improved, if he remains in intermittent 2-1 AV block, pacemaker implant may be indicated.  Signed,  Cannen Dupras Gladis Norton, MD  08/10/2024 1:29 PM

## 2024-08-10 NOTE — Consult Note (Addendum)
 Reason for Consult: Renal failure Referring Physician:  Dr. Zenaida  Chief Complaint: Chest pain  Assessment/Plan: Acute renal failure on CKD3b with a baseline creatinine in the 1.9-2.1 range followed by Dr. Rachele with blood pressure which has dropped as lot as 89/51, most of his pressures are in the 106-115/50 range.  - I do not see any nephrotoxic agents, no contrast given, relative hypotension and certainly possible he could be in ATN.  - Biut will 1st check PVR bladder scan -> >250 ml -> will check in and out catheter to confirm. - If significant PVR volume then will need a foley catheter. No e/o hydropephrosis by CT without contrast.  Update - PVR -> requested foley to gravity  - Fortunately no absolute indication for dialysis at this time. - Will follow closely with you; check urinalysis and urine lytes.   -Monitor Daily I/Os, Daily weight  -Maintain MAP>65 for optimal renal perfusion.  - Avoid nephrotoxic agents such as IV contrast, NSAIDs, and phosphate containing bowel preps (FLEETS)   Cardiogenic shock - now weaned off pressor support, conversing, good appetite. . 2nd degree AV block seen by EP with no pacemaker currently recommended bec his conduction has improved.  Obstructive uropathy - repeat bladder scan PVR; if significant will need either an in&out cath or foley catheter. Cassell was just removed yesterday.  BPH on tamsulosin  T2DM - HbA1c 7.1 Hypertension COPD with emphysema   HPI: William Wells is an 89 y.o. male with a history of CKD 3B followed by Dr. Rachele, hypertension, hyperlipidemia, MGUS, lung cancer status post robotic assisted lobectomy in 2021, type 2 diabetes presenting with chest pain, weakness, fatigue.  Patient states that the pain started 10 days prior to admission lasting about 30 minutes multiple times throughout the day but intensified over the past 4 days prior to admission occurring at rest midsternal associated with chest tightness and not  exertional.  During initial evaluation chest pain increasing to 10/10 with second-degree AV block and troponin noted to be 22,000.  Echo showed EF of 50 to 55% with inferior akinesis treated with nitroglycerin as needed, initially heparin, aspirin/Plavix and a statin.  Patient also treated with dopamine and norepi during his hospitalization for cardiogenic shock which has been since weaned off.  Patient also had a Foley in place because of concerns for obstruction which was discontinued on 11//25. Denies NSAID use or any family members on dialysis.   ROS Pertinent items are noted in HPI.  Chemistry and CBC: Creatinine  Date/Time Value Ref Range Status  04/19/2024 01:30 PM 1.95 (H) 0.61 - 1.24 mg/dL Final  92/89/7979 87:99 AM 2.3 (A) 0.6 - 1.3 Final   Creatinine, Ser  Date/Time Value Ref Range Status  08/10/2024 03:36 AM 4.20 (H) 0.61 - 1.24 mg/dL Final  88/95/7974 96:74 AM 4.08 (H) 0.61 - 1.24 mg/dL Final  88/96/7974 89:55 AM 3.68 (H) 0.61 - 1.24 mg/dL Final  88/97/7974 91:52 AM 3.01 (H) 0.61 - 1.24 mg/dL Final  88/98/7974 96:79 AM 3.44 (H) 0.61 - 1.24 mg/dL Final  89/68/7974 96:56 PM 3.47 (H) 0.61 - 1.24 mg/dL Final  94/70/7974 88:93 AM 2.00 (H) 0.61 - 1.24 mg/dL Final  97/73/7974 96:75 PM 1.91 (H) 0.61 - 1.24 mg/dL Final  97/88/7974 87:94 PM 1.96 (H) 0.61 - 1.24 mg/dL Final  91/91/7975 98:57 PM 1.69 (H) 0.61 - 1.24 mg/dL Final  97/91/7975 89:63 AM 1.85 (H) 0.61 - 1.24 mg/dL Final  91/95/7976 97:57 PM 1.85 (H) 0.61 - 1.24 mg/dL Final  09/08/2021 04:29 PM 1.73 (H) 0.61 - 1.24 mg/dL Final  91/75/7977 89:89 AM 1.86 (H) 0.61 - 1.24 mg/dL Final  95/86/7977 89:44 AM 1.88 (H) 0.61 - 1.24 mg/dL Final  98/87/7977 98:81 PM 1.88 (H) 0.61 - 1.24 mg/dL Final  90/79/7978 94:64 AM 1.68 (H) 0.61 - 1.24 mg/dL Final  90/80/7978 96:59 AM 1.72 (H) 0.61 - 1.24 mg/dL Final  90/81/7978 98:88 AM 1.71 (H) 0.61 - 1.24 mg/dL Final  90/82/7978 90:45 AM 1.80 (H) 0.61 - 1.24 mg/dL Final  90/84/7978 88:72 AM 1.96  (H) 0.61 - 1.24 mg/dL Final  91/75/7978 91:58 AM 2.06 (H) 0.61 - 1.24 mg/dL Final  91/90/7978 88:67 AM 2.11 (H) 0.40 - 1.50 mg/dL Final  92/98/7978 94:53 AM 1.98 (H) 0.61 - 1.24 mg/dL Final  93/76/7978 98:67 PM 1.95 (H) 0.61 - 1.24 mg/dL Final  87/85/7979 94:98 PM 2.09 (H) 0.61 - 1.24 mg/dL Final  92/81/7990 98:89 PM 1.24  Final   Recent Labs  Lab 08/05/24 1543 08/06/24 0320 08/06/24 1050 08/06/24 2304 08/07/24 0847 08/07/24 1201 08/07/24 1428 08/08/24 1044 08/09/24 0325 08/10/24 0336  NA 135 134*  --   --  131*  --   --  129* 127* 129*  K 5.3* 5.9*   < > 4.6 4.8 4.8 4.8 4.7 4.7 3.9  CL 98 101  --   --  101  --   --  99 97* 97*  CO2 25 17*  --   --  18*  --   --  17* 18* 19*  GLUCOSE 177* 180*  --   --  241*  --   --  175* 186* 241*  BUN 46* 49*  --   --  45*  --   --  51* 63* 72*  CREATININE 3.47* 3.44*  --   --  3.01*  --   --  3.68* 4.08* 4.20*  CALCIUM  9.9 8.7*  --   --  8.3*  --   --  8.0* 7.7* 7.7*   < > = values in this interval not displayed.   Recent Labs  Lab 08/07/24 0847 08/08/24 1044 08/09/24 0325 08/10/24 0336  WBC 15.1* 11.9* 9.7 7.4  HGB 11.4* 10.5* 9.1* 9.0*  HCT 34.4* 31.6* 26.5* 26.2*  MCV 91.0 90.8 88.6 88.2  PLT 141* 144* 140* 130*   Liver Function Tests: Recent Labs  Lab 08/08/24 1044 08/09/24 0325 08/10/24 0336  AST 29 92* 216*  ALT 41 101* 218*  ALKPHOS 54 74 96  BILITOT 0.9 0.8 0.6  PROT 6.1* 5.7* 3.6*  ALBUMIN  2.2* 2.0* 1.8*   No results for input(s): LIPASE, AMYLASE in the last 168 hours. No results for input(s): AMMONIA in the last 168 hours. Cardiac Enzymes: Recent Labs  Lab 08/05/24 1741  CKTOTAL 375   Iron Studies: No results for input(s): IRON, TIBC, TRANSFERRIN, FERRITIN in the last 72 hours. PT/INR: @LABRCNTIP (inr:5)  Xrays/Other Studies: ) Results for orders placed or performed during the hospital encounter of 08/05/24 (from the past 48 hours)  Urinalysis, Routine w reflex microscopic -Urine, Clean  Catch     Status: Abnormal   Collection Time: 08/08/24  1:47 PM  Result Value Ref Range   Color, Urine YELLOW YELLOW   APPearance CLEAR CLEAR   Specific Gravity, Urine 1.025 1.005 - 1.030   pH 6.0 5.0 - 8.0   Glucose, UA NEGATIVE NEGATIVE mg/dL   Hgb urine dipstick LARGE (A) NEGATIVE   Bilirubin Urine NEGATIVE NEGATIVE   Ketones, ur NEGATIVE NEGATIVE mg/dL  Protein, ur 100 (A) NEGATIVE mg/dL   Nitrite NEGATIVE NEGATIVE   Leukocytes,Ua MODERATE (A) NEGATIVE    Comment: Performed at [facility], [address] [place], [place] [address]  Urinalysis, Microscopic (reflex)     Status: Abnormal   Collection Time: 08/08/24  1:47 PM  Result Value Ref Range   RBC / HPF 21-50 0 - 5 RBC/hpf   WBC, UA 11-20 0 - 5 WBC/hpf   Bacteria, UA FEW (A) NONE SEEN   Squamous Epithelial / HPF 0-5 0 - 5 /HPF    Comment: Performed at [facility], [address], [place], [place] [address]  Culture, blood (Routine X 2) w Reflex to ID Panel     Status: None (Preliminary result)   Collection Time: 08/08/24  2:52 PM   Specimen: BLOOD RIGHT HAND  Result Value Ref Range   Specimen Description BLOOD RIGHT HAND    Special Requests      BOTTLES DRAWN AEROBIC ONLY Blood Culture adequate volume   Culture      NO GROWTH 2 DAYS Performed at [facility], [address] [place], [place] [address]    Report Status PENDING   Culture, blood (Routine X 2) w Reflex to ID Panel     Status: None (Preliminary result)   Collection Time: 08/08/24  2:53 PM   Specimen: BLOOD  Result Value Ref Range   Specimen Description BLOOD SITE NOT SPECIFIED    Special Requests      BOTTLES DRAWN AEROBIC AND ANAEROBIC Blood Culture adequate volume   Culture      NO GROWTH 2 DAYS Performed at [facility], [address], [place], [place] [address]    Report Status PENDING   Glucose, capillary     Status: Abnormal   Collection Time: 08/08/24  4:03 PM  Result Value Ref Range   Glucose-Capillary 242 (H)  70 - 99 mg/dL    Comment: Glucose reference range applies only to samples taken after fasting for at least 8 hours.  Glucose, capillary     Status: Abnormal   Collection Time: 08/08/24  9:27 PM  Result Value Ref Range   Glucose-Capillary 279 (H) 70 - 99 mg/dL    Comment: Glucose reference range applies only to samples taken after fasting for at least 8 hours.  CBC     Status: Abnormal   Collection Time: 08/09/24  3:25 AM  Result Value Ref Range   WBC 9.7 4.0 - 10.5 K/uL   RBC 2.99 (L) 4.22 - 5.81 MIL/uL   Hemoglobin 9.1 (L) 13.0 - 17.0 g/dL   HCT 73.4 (L) 60.9 - 47.9 %   MCV 88.6 80.0 - 100.0 fL   MCH 30.4 26.0 - 34.0 pg   MCHC 34.3 30.0 - 36.0 g/dL   RDW 85.3 88.4 - 84.4 %   Platelets 140 (L) 150 - 400 K/uL   nRBC 0.0 0.0 - 0.2 %    Comment: Performed at [facility], [address] [place], [place] [address]  Comprehensive metabolic panel     Status: Abnormal   Collection Time: 08/09/24  3:25 AM  Result Value Ref Range   Sodium 127 (L) 135 - 145 mmol/L   Potassium 4.7 3.5 - 5.1 mmol/L   Chloride 97 (L) 98 - 111 mmol/L   CO2 18 (L) 22 - 32 mmol/L   Glucose, Bld 186 (H) 70 - 99 mg/dL    Comment: Glucose reference range  applies only to samples taken after fasting for at least 8 hours.   BUN 63 (H) 8 - 23 mg/dL   Creatinine, Ser 5.91 (H) 0.61 - 1.24 mg/dL   Calcium  7.7 (L) 8.9 - 10.3 mg/dL   Total Protein 5.7 (L) 6.5 - 8.1 g/dL   Albumin  2.0 (L) 3.5 - 5.0 g/dL   AST 92 (H) 15 - 41 U/L   ALT 101 (H) 0 - 44 U/L   Alkaline Phosphatase 74 38 - 126 U/L   Total Bilirubin 0.8 0.0 - 1.2 mg/dL   GFR, Estimated 13 (L) >60 mL/min    Comment: (NOTE) Calculated using the CKD-EPI Creatinine Equation (2021)    Anion gap 12 5 - 15    Comment: Performed at [facility], [address] [place], [place] [address]  Glucose, capillary     Status: Abnormal   Collection Time: 08/09/24  6:15 AM  Result Value Ref Range   Glucose-Capillary 172 (H) 70 - 99 mg/dL    Comment: Glucose  reference range applies only to samples taken after fasting for at least 8 hours.  Glucose, capillary     Status: Abnormal   Collection Time: 08/09/24 11:04 AM  Result Value Ref Range   Glucose-Capillary 282 (H) 70 - 99 mg/dL    Comment: Glucose reference range applies only to samples taken after fasting for at least 8 hours.  Glucose, capillary     Status: Abnormal   Collection Time: 08/09/24  4:37 PM  Result Value Ref Range   Glucose-Capillary 261 (H) 70 - 99 mg/dL    Comment: Glucose reference range applies only to samples taken after fasting for at least 8 hours.  Glucose, capillary     Status: Abnormal   Collection Time: 08/09/24  9:18 PM  Result Value Ref Range   Glucose-Capillary 251 (H) 70 - 99 mg/dL    Comment: Glucose reference range applies only to samples taken after fasting for at least 8 hours.  CBC     Status: Abnormal   Collection Time: 08/10/24  3:36 AM  Result Value Ref Range   WBC 7.4 4.0 - 10.5 K/uL   RBC 2.97 (L) 4.22 - 5.81 MIL/uL   Hemoglobin 9.0 (L) 13.0 - 17.0 g/dL   HCT 73.7 (L) 60.9 - 47.9 %   MCV 88.2 80.0 - 100.0 fL   MCH 30.3 26.0 - 34.0 pg   MCHC 34.4 30.0 - 36.0 g/dL   RDW 85.3 88.4 - 84.4 %   Platelets 130 (L) 150 - 400 K/uL   nRBC 0.0 0.0 - 0.2 %    Comment: Performed at [facility], [address] [place], [place] [address]  Comprehensive metabolic panel     Status: Abnormal   Collection Time: 08/10/24  3:36 AM  Result Value Ref Range   Sodium 129 (L) 135 - 145 mmol/L   Potassium 3.9 3.5 - 5.1 mmol/L   Chloride 97 (L) 98 - 111 mmol/L   CO2 19 (L) 22 - 32 mmol/L   Glucose, Bld 241 (H) 70 - 99 mg/dL    Comment: Glucose reference range applies only to samples taken after fasting for at least 8 hours.   BUN 72 (H) 8 - 23 mg/dL   Creatinine, Ser 5.79 (H) 0.61 - 1.24 mg/dL   Calcium  7.7 (L) 8.9 - 10.3 mg/dL   Total Protein 3.6 (L) 6.5 - 8.1 g/dL   Albumin  1.8 (L) 3.5 - 5.0 g/dL   AST  216 (H) 15 - 41 U/L   ALT 218 (H) 0 - 44 U/L    Alkaline Phosphatase 96 38 - 126 U/L   Total Bilirubin 0.6 0.0 - 1.2 mg/dL   GFR, Estimated 13 (L) >60 mL/min    Comment: (NOTE) Calculated using the CKD-EPI Creatinine Equation (2021)    Anion gap 13 5 - 15    Comment: Performed at [facility], [address] [place], [place] [address]  Glucose, capillary     Status: Abnormal   Collection Time: 08/10/24  6:10 AM  Result Value Ref Range   Glucose-Capillary 174 (H) 70 - 99 mg/dL    Comment: Glucose reference range applies only to samples taken after fasting for at least 8 hours.  Glucose, capillary     Status: Abnormal   Collection Time: 08/10/24 11:19 AM  Result Value Ref Range   Glucose-Capillary 381 (H) 70 - 99 mg/dL    Comment: Glucose reference range applies only to samples taken after fasting for at least 8 hours.   No results found.  PMH:   Past Medical History:  Diagnosis Date   Acid reflux disease    Arthritis    Complication of anesthesia    appendix surgery (89 year old) - passed out once patient got back to floor unit   Diabetes mellitus    Dyspnea    Family history of adverse reaction to anesthesia    daughter states that she has a difficult time waking up   Hiatal hernia    High cholesterol    Hypertension    Hypothyroidism    Lung cancer (HCC)    s/p XI robotic assisted thoracoscopy for LU lobectomy 06/22/20   Monoclonal gammopathy of unknown significance (MGUS) 03/28/2020   Normocytic anemia 03/28/2020   Renal disorder    renal insufficiency   Type 2 diabetes mellitus (HCC)     PSH:   Past Surgical History:  Procedure Laterality Date   APPENDECTOMY     ARTERY BIOPSY Right 11/23/2023   Procedure: RIGHT BIOPSY TEMPORAL ARTERY;  Surgeon: Gretta Lonni PARAS, MD;  Location: [facility] OR;  Service: Vascular;  Laterality: Right;   BRONCHIAL BIOPSY  05/29/2020   Procedure: BRONCHIAL BIOPSIES;  Surgeon: Shelah Lamar RAMAN, MD;  Location: [facility] ENDOSCOPY;  Service: Pulmonary;;   BRONCHIAL BRUSHINGS  05/29/2020    Procedure: BRONCHIAL BRUSHINGS;  Surgeon: Shelah Lamar RAMAN, MD;  Location: [facility] ENDOSCOPY;  Service: Pulmonary;;   BRONCHIAL NEEDLE ASPIRATION BIOPSY  05/29/2020   Procedure: BRONCHIAL NEEDLE ASPIRATION BIOPSIES;  Surgeon: Shelah Lamar RAMAN, MD;  Location: [facility] ENDOSCOPY;  Service: Pulmonary;;   BRONCHIAL WASHINGS  05/29/2020   Procedure: BRONCHIAL WASHINGS;  Surgeon: Shelah Lamar RAMAN, MD;  Location: [facility] ENDOSCOPY;  Service: Pulmonary;;   CATARACT EXTRACTION W/PHACO Left 01/23/2023   Procedure: CATARACT EXTRACTION PHACO AND INTRAOCULAR LENS PLACEMENT (IOC);  Surgeon: Harrie Agent, MD;  Location: [facility] ORS;  Service: Ophthalmology;  Laterality: Left;  CDE: 13.69   CATARACT EXTRACTION W/PHACO Right 02/20/2023   Procedure: CATARACT EXTRACTION PHACO AND INTRAOCULAR LENS PLACEMENT (IOC);  Surgeon: Harrie Agent, MD;  Location: [facility] ORS;  Service: Ophthalmology;  Laterality: Right;  CDE: 12.88   CERVICAL SPINE SURGERY     EYE SURGERY     strabismus   INTERCOSTAL NERVE BLOCK Left 06/22/2020   Procedure: INTERCOSTAL NERVE BLOCK;  Surgeon: Kerrin Elspeth BROCKS, MD;  Location: [facility] OR;  Service: Thoracic;  Laterality: Left;   NODE DISSECTION Left 06/22/2020   Procedure: NODE DISSECTION;  Surgeon: Kerrin Elspeth BROCKS, MD;  Location: [facility] OR;  Service: Thoracic;  Laterality: Left;   TONSILLECTOMY     VIDEO BRONCHOSCOPY WITH ENDOBRONCHIAL NAVIGATION N/A 05/29/2020   Procedure: VIDEO BRONCHOSCOPY WITH ENDOBRONCHIAL NAVIGATION;  Surgeon: Shelah Lamar RAMAN, MD;  Location: [facility] ENDOSCOPY;  Service: Pulmonary;  Laterality: N/A;    Allergies: No Known Allergies  Medications:   Prior to Admission medications   Medication Sig Start Date End Date Taking? Authorizing Provider  acetaminophen  (TYLENOL ) 500 MG tablet Take 2 tablets (1,000 mg total) by mouth every 6 (six) hours. Patient taking differently: Take 500 mg by mouth daily as needed for headache, moderate pain (pain score 4-6) or mild pain (pain score 1-3). 06/26/20  Yes  Conte, Tessa N, PA-C  albuterol  (VENTOLIN  HFA) 108 (90 Base) MCG/ACT inhaler INAHLE 2 PUFFS BY MOUTH 4 TIMES A DAY AS NEEDED FOR COUGH/WHEEZING   Yes [provider]  diclofenac Sodium (VOLTAREN) 1 % GEL Apply 1 Application topically daily as needed (pain).   Yes [provider]  docusate sodium  (COLACE) 100 MG capsule Take 1 capsule (100 mg total) by mouth 2 (two) times daily as needed for mild constipation. Patient taking differently: Take 100 mg by mouth 2 (two) times a week. 06/26/20  Yes Lucien Orren SAILOR, PA-C  ferrous sulfate 325 (65 FE) MG tablet Take 325 mg by mouth every Monday, Wednesday, and Friday.   Yes [provider]  gabapentin  (NEURONTIN ) 300 MG capsule Take 300 mg by mouth 3 (three) times daily. 12/13/21  Yes [provider]  glipiZIDE  (GLUCOTROL  XL) 2.5 MG 24 hr tablet Take 1.25 mg by mouth daily as needed (blood sugar above 160). 03/20/20  Yes [provider]  guaiFENesin (MUCINEX) 600 MG 12 hr tablet Take 600 mg by mouth 2 (two) times daily.   Yes [provider]  HYDROcodone -acetaminophen  (NORCO) 7.5-325 MG tablet Take 1 tablet by mouth every 6 (six) hours as needed for moderate pain (pain score 4-6). Patient taking differently: Take 1 tablet by mouth daily as needed for moderate pain (pain score 4-6) or severe pain (pain score 7-10). 06/08/24  Yes Jerri Kay HERO, MD  levothyroxine  (SYNTHROID ) 75 MCG tablet Take 75 mcg by mouth at bedtime. 05/23/21  Yes [provider]  LOKELMA 5 g packet Take 5 g by mouth every Monday, Wednesday, and Friday. 10/08/20  Yes [provider]  Multiple Vitamin (MULTIVITAMIN ADULT PO) Take 1 tablet by mouth at bedtime.   Yes [provider]  Multiple Vitamins-Minerals (PRESERVISION AREDS PO) Take 1 tablet by mouth in the morning and at bedtime.   Yes [provider]  pantoprazole (PROTONIX) 20 MG tablet Take 20 mg by mouth daily. 05/27/24  Yes [provider]   rosuvastatin  (CRESTOR ) 40 MG tablet Take 40 mg by mouth daily.    Yes [provider]  telmisartan (MICARDIS) 20 MG tablet Take 10 mg by mouth daily. 04/08/23  Yes [provider]  VITAMIN D  PO Take 2 capsules by mouth at bedtime.   Yes [provider]  VITAMIN E PO Take 1 capsule by mouth at bedtime.   Yes [provider]  mupirocin ointment (BACTROBAN) 2 % Apply 1 Application topically 3 (three) times daily. Patient not taking: Reported on 08/08/2024 06/30/24   [provider]  omeprazole (PRILOSEC) 20 MG capsule Take 20 mg by mouth daily. Patient not taking: Reported on 08/08/2024    [provider]    Discontinued Meds:   Medications  Discontinued During This Encounter  Medication Reason   0.9 %  sodium chloride  infusion    fentaNYL  (SUBLIMAZE ) injection 25 mcg    sodium zirconium cyclosilicate (LOKELMA) packet 5 g    ONETOUCH ULTRA test strip Patient Preference   Lancets (ONETOUCH DELICA PLUS LANCET30G) MISC Patient Preference   loratadine (CLARITIN) 10 MG tablet Patient Preference   norepinephrine (LEVOPHED) 4mg  in (0.016 mg/mL) premix infusion    DOPamine (INTROPIN) 800 mg in dextrose  5 % 250 mL (3.2 mg/mL) infusion    midodrine (PROAMATINE) tablet 5 mg    0.9 %  sodium chloride  infusion    insulin  aspart (novoLOG ) injection 0-9 Units    clopidogrel (PLAVIX) tablet 75 mg    sodium zirconium cyclosilicate (LOKELMA) packet 5 g    enoxaparin  (LOVENOX ) injection 30 mg    potassium chloride (KLOR-CON) packet 20 mEq     Social History:  reports that he quit smoking about 25 years ago. His smoking use included cigarettes. He started smoking about 72 years ago. He has a 23.5 pack-year smoking history. He has never used smokeless tobacco. He reports that he does not drink alcohol and does not use drugs.  Family History:   Family History  Problem Relation Age of Onset   Diabetes Mother    Cancer Father    Cancer Sister     Diabetes Brother    Cancer Brother    Diabetes Brother    Parkinson's disease Brother    Diabetes Brother     Blood pressure (!) 113/47, pulse (!) 57, temperature 99.3 F (37.4 C), temperature source Oral, resp. rate 18, height 5' 8 (1.727 m), weight 84.4 kg, SpO2 94%. General appearance: alert, cooperative, and appears stated age Head: Normocephalic, without obvious abnormality, atraumatic Eyes: negative Neck: no adenopathy, no carotid bruit, supple, symmetrical, trachea midline, and thyroid  not enlarged, symmetric, no tenderness/mass/nodules Back: symmetric, no curvature. ROM normal. No CVA tenderness. Resp: clear to auscultation bilaterally Cardio: bradycardic GI: soft, non-tender; bowel sounds normal; no masses,  no organomegaly Extremities: edema tr       MELIA LYNWOOD ORN, MD 08/10/2024, 12:15 PM

## 2024-08-10 NOTE — Evaluation (Signed)
 Occupational Therapy Evaluation Patient Details Name: William Wells MRN: [id number] DOB: [birth year] Today's Date: 08/10/2024   History of Present Illness   Pt is an 89 y/o M presenting to ED on 10/31 wtih chest pressure, generalized weakness, fall, CT negative, EKG with ST elevation, evidence of AV block, suspect missing MI. Plan for possible PPM. Also with AKI. PMH includes lung CA s/p robotic assisted upper lobectomy, HTN, DM2, CKD IIIB     Clinical Impressions Pt ind at baseline with ADLs, daughter assists with IADLs/meds. Pt uses rollator for mobility and lives with daughter. Pt currently needs up to mod A for ADLs, and mod +2 for transfers with RW. Pt able to walk ~24ft distance in room before fatiguing, SpO2 desatting to mid 80s with poor pleth on 2L O2, incr to 94% on 2L once seated in chair. Pt presenting with impairments listed below, will follow acutely. Patient will benefit from intensive inpatient follow-up therapy, >3 hours/day to maximize safety/ind with ADL/functional mobility.      If plan is discharge home, recommend the following:   Two people to help with walking and/or transfers;A lot of help with bathing/dressing/bathroom;Direct supervision/assist for medications management;Assist for transportation;Direct supervision/assist for financial management;Supervision due to cognitive status     Functional Status Assessment   Patient has had a recent decline in their functional status and demonstrates the ability to make significant improvements in function in a reasonable and predictable amount of time.     Equipment Recommendations   Other (comment) (defer)     Recommendations for Other Services   PT consult;Rehab consult     Precautions/Restrictions   Precautions Precautions: Fall Precaution/Restrictions Comments: watch O2 Restrictions Weight Bearing Restrictions Per Provider Order: No     Mobility Bed Mobility               General bed  mobility comments: OOB in chair upon arrival and departure    Transfers Overall transfer level: Needs assistance Equipment used: Rolling walker (2 wheels) Transfers: Sit to/from Stand Sit to Stand: Mod assist, +2 physical assistance                  Balance Overall balance assessment: Needs assistance Sitting-balance support: Feet supported Sitting balance-Leahy Scale: Good     Standing balance support: During functional activity, Reliant on assistive device for balance Standing balance-Leahy Scale: Poor                             ADL either performed or assessed with clinical judgement   ADL Overall ADL's : Needs assistance/impaired Eating/Feeding: Set up;Sitting   Grooming: Minimal assistance;Sitting   Upper Body Bathing: Moderate assistance;Sitting   Lower Body Bathing: Moderate assistance;Sitting/lateral leans   Upper Body Dressing : Moderate assistance;Sitting   Lower Body Dressing: Moderate assistance;Sitting/lateral leans   Toilet Transfer: Moderate assistance;+2 for physical assistance   Toileting- Clothing Manipulation and Hygiene: Moderate assistance       Functional mobility during ADLs: Moderate assistance;+2 for physical assistance;Rolling walker (2 wheels)       Vision   Vision Assessment?: No apparent visual deficits     Perception Perception: Not tested       Praxis Praxis: Not tested       Pertinent Vitals/Pain Pain Assessment Pain Assessment: Faces Pain Score: 6  Faces Pain Scale: Hurts even more Pain Location: L hand at IV site Pain Descriptors / Indicators: Discomfort Pain Intervention(s): Limited activity within patient's tolerance,  Monitored during session, Repositioned     Extremity/Trunk Assessment Upper Extremity Assessment Upper Extremity Assessment: Generalized weakness   Lower Extremity Assessment Lower Extremity Assessment: Defer to PT evaluation   Cervical / Trunk Assessment Cervical / Trunk  Assessment: Kyphotic   Communication Communication Communication: No apparent difficulties   Cognition Arousal: Alert Behavior During Therapy: WFL for tasks assessed/performed Cognition: No apparent impairments             OT - Cognition Comments: not formally assessed but pt alert, following commands                 Following commands: Intact       Cueing  General Comments   Cueing Techniques: Verbal cues  pt down to mid 80s on 2L O2 with poor pleth, satting 94% once seated in chair on 2L at end of session. Pt wtih HR 48bpm at lowest   Exercises     Shoulder Instructions      Home Living Family/patient expects to be discharged to:: Private residence Living Arrangements: Children (daughter) Available Help at Discharge: Family;Available PRN/intermittently Type of Home: House Home Access: Ramped entrance     Home Layout: One level     Bathroom Shower/Tub: Walk-in shower         Home Equipment: Shower seat - built in;Cane - single point;Rollator (4 wheels)          Prior Functioning/Environment Prior Level of Function : Independent/Modified Independent             Mobility Comments: Uses rollator for longer distances, prn use of cane in home ADLs Comments: Ind, daughter manages meds    OT Problem List: Decreased strength;Decreased range of motion;Decreased activity tolerance;Impaired balance (sitting and/or standing);Decreased coordination;Decreased safety awareness;Cardiopulmonary status limiting activity   OT Treatment/Interventions: Self-care/ADL training;Therapeutic exercise;Energy conservation;DME and/or AE instruction;Therapeutic activities;Patient/family education;Balance training      OT Goals(Current goals can be found in the care plan section)   Acute Rehab OT Goals Patient Stated Goal: none stated OT Goal Formulation: With patient Time For Goal Achievement: 08/24/24 Potential to Achieve Goals: Good ADL Goals Pt Will Perform  Upper Body Dressing: sitting;with contact guard assist Pt Will Perform Lower Body Dressing: sitting/lateral leans;sit to/from stand;with contact guard assist Pt Will Transfer to Toilet: with supervision;ambulating Pt Will Perform Tub/Shower Transfer: Shower transfer;with min assist;ambulating;shower seat   OT Frequency:  Min 2X/week    Co-evaluation              AM-PAC OT 6 Clicks Daily Activity     Outcome Measure Help from another person eating meals?: A Little Help from another person taking care of personal grooming?: A Little Help from another person toileting, which includes using toliet, bedpan, or urinal?: A Lot Help from another person bathing (including washing, rinsing, drying)?: A Lot Help from another person to put on and taking off regular upper body clothing?: A Lot Help from another person to put on and taking off regular lower body clothing?: A Lot 6 Click Score: 14   End of Session Equipment Utilized During Treatment: Gait belt;Rolling walker (2 wheels) Nurse Communication: Mobility status (O2)  Activity Tolerance: Patient tolerated treatment well Patient left: in chair;with call bell/phone within reach;with family/visitor present  OT Visit Diagnosis: Unsteadiness on feet (R26.81);Other abnormalities of gait and mobility (R26.89);Muscle weakness (generalized) (M62.81)                Time: 8980-8960 OT Time Calculation (min): 20 min Charges:  OT General  Charges $OT Visit: 1 Visit OT Evaluation $OT Eval Moderate Complexity: 1 Mod  Veroncia Jezek K, OTD, OTR/L SecureChat Preferred Acute Rehab [phone]   Laneta POUR Koonce 08/10/2024, 11:51 AM

## 2024-08-11 DIAGNOSIS — I2119 ST elevation (STEMI) myocardial infarction involving other coronary artery of inferior wall: Secondary | ICD-10-CM | POA: Diagnosis not present

## 2024-08-11 LAB — RENAL FUNCTION PANEL
Albumin: 2.1 g/dL — ABNORMAL LOW (ref 3.5–5.0)
Anion gap: 13 (ref 5–15)
BUN: 68 mg/dL — ABNORMAL HIGH (ref 8–23)
CO2: 22 mmol/L (ref 22–32)
Calcium: 8.2 mg/dL — ABNORMAL LOW (ref 8.9–10.3)
Chloride: 97 mmol/L — ABNORMAL LOW (ref 98–111)
Creatinine, Ser: 3.79 mg/dL — ABNORMAL HIGH (ref 0.61–1.24)
GFR, Estimated: 15 mL/min — ABNORMAL LOW (ref >60)
Glucose, Bld: 131 mg/dL — ABNORMAL HIGH (ref 70–99)
Phosphorus: 4.2 mg/dL (ref 2.5–4.6)
Potassium: 4 mmol/L (ref 3.5–5.1)
Sodium: 132 mmol/L — ABNORMAL LOW (ref 135–145)

## 2024-08-11 LAB — GLUCOSE, CAPILLARY
Glucose-Capillary: 143 mg/dL — ABNORMAL HIGH (ref 70–99)
Glucose-Capillary: 310 mg/dL — ABNORMAL HIGH (ref 70–99)
Glucose-Capillary: 260 mg/dL — ABNORMAL HIGH (ref 70–99)
Glucose-Capillary: 175 mg/dL — ABNORMAL HIGH (ref 70–99)
Glucose-Capillary: 239 mg/dL — ABNORMAL HIGH (ref 70–99)

## 2024-08-11 LAB — HEPATIC FUNCTION PANEL
ALT: 221 U/L — ABNORMAL HIGH (ref 0–44)
AST: 131 U/L — ABNORMAL HIGH (ref 15–41)
Albumin: 2 g/dL — ABNORMAL LOW (ref 3.5–5.0)
Alkaline Phosphatase: 111 U/L (ref 38–126)
Bilirubin, Direct: 0.1 mg/dL (ref 0.0–0.2)
Indirect Bilirubin: 0.5 mg/dL (ref 0.3–0.9)
Total Bilirubin: 0.6 mg/dL (ref 0.0–1.2)
Total Protein: 6.1 g/dL — ABNORMAL LOW (ref 6.5–8.1)

## 2024-08-11 NOTE — Inpatient Diabetes Management (Signed)
 Inpatient Diabetes Program Recommendations  AACE/ADA: New Consensus Statement on Inpatient Glycemic Control (2015)  Target Ranges:  Prepandial:   less than 140 mg/dL      Peak postprandial:   less than 180 mg/dL (1-2 hours)      Critically ill patients:  140 - 180 mg/dL   Lab Results  Component Value Date   GLUCAP 310 (H) 08/11/2024   HGBA1C 7.1 (H) 08/05/2024    Review of Glycemic Control  Latest Reference Range & Units 08/10/24 06:10 08/10/24 11:19 08/10/24 17:09 08/10/24 21:09 08/11/24 06:40 08/11/24 08:30  Glucose-Capillary 70 - 99 mg/dL 825 (H) 618 (H) 750 (H) 220 (H) 143 (H) 310 (H)  (H): Data is abnormally high  Diabetes history: DM2 Outpatient Diabetes medications:  Glipizide  2.5 mg QD Current orders for Inpatient glycemic control:  Novolog  0-15 units TID and 0-5 units QHS  Inpatient Diabetes Program Recommendations:    Postprandials are elevated, please consider:  Novolog  4 units TIDMC if he consumes at least 50%.  Thank you, Wyvonna Pinal, MSN, CDCES Diabetes Coordinator Inpatient Diabetes Program [phone] (team pager from 8a-5p)

## 2024-08-11 NOTE — Progress Notes (Signed)
 Inpatient Rehab Admissions Coordinator Note:   Per therapy recommendations patient was screened for CIR candidacy by Reche FORBES Lowers, PT. At this time, pt appears to be a potential candidate for CIR. I will place an order for rehab consult for full assessment, per our protocol.  Note insurance shara will be required for CIR and may not be able to get with [facility] Medicare.  Please contact me any with questions.  Reche Lowers, PT, DPT [phone] 08/11/24 2:13 PM

## 2024-08-11 NOTE — Progress Notes (Signed)
 EP brief tele note -   Tele largely in wenckebach with intermittent 2:1, rates 40-60s.  Cr improving, but still quite elevated LFTs remain elevated   EP will continue to follow clinical course. No PPM planned at this time   Anice Wilshire, NP Electrophysiology 08/11/24 10:08 AM

## 2024-08-11 NOTE — Progress Notes (Signed)
 Patient ID: William Wells, male   DOB: 09-04-1934, 89 y.o.   MRN: [id number] S: No new complaints.  Marked improvement of UOP since foley catheter placed and BUN/Cr improving. O:BP (!) 126/51   Pulse 67   Temp 98.5 F (36.9 C) (Oral)   Resp 19   Ht 5' 8 (1.727 m)   Wt 80.5 kg   SpO2 94%   BMI 26.98 kg/m   Intake/Output Summary (Last 24 hours) at 08/11/2024 0859 Last data filed at 08/11/2024 0700 Gross per 24 hour  Intake 360 ml  Output 6395 ml  Net -6035 ml   Intake/Output: I/O last 3 completed shifts: In: 600 [P.O.:600] Out: 8120 [Urine:8120]  Intake/Output this shift:  No intake/output data recorded. Weight change: -3.5 kg Gen: NAD CVS: RRR Resp:CTA Abd: +BS, soft, NT/ND Ext: no edema  Recent Labs  Lab 08/05/24 1741 08/06/24 0320 08/06/24 1050 08/07/24 0847 08/07/24 1201 08/07/24 1428 08/08/24 1044 08/09/24 0325 08/10/24 0336 08/10/24 1346 08/11/24 0245  NA  --  134*  --  131*  --   --  129* 127* 129* 129* 132*  K  --  5.9*   < > 4.8 4.8 4.8 4.7 4.7 3.9 3.7 4.0  CL  --  101  --  101  --   --  99 97* 97* 95* 97*  CO2  --  17*  --  18*  --   --  17* 18* 19* 21* 22  GLUCOSE  --  180*  --  241*  --   --  175* 186* 241* 258* 131*  BUN  --  49*  --  45*  --   --  51* 63* 72* 72* 68*  CREATININE  --  3.44*  --  3.01*  --   --  3.68* 4.08* 4.20* 3.99* 3.79*  ALBUMIN  3.2* 3.2*  --  2.5*  --   --  2.2* 2.0* 1.8*  --  2.0*  2.1*  CALCIUM   --  8.7*  --  8.3*  --   --  8.0* 7.7* 7.7* 8.1* 8.2*  PHOS  --   --   --   --   --   --   --   --   --   --  4.2  AST 65* 68*  --  31  --   --  29 92* 216*  --  131*  ALT 28 31  --  35  --   --  41 101* 218*  --  221*   < > = values in this interval not displayed.   Liver Function Tests: Recent Labs  Lab 08/09/24 0325 08/10/24 0336 08/11/24 0245  AST 92* 216* 131*  ALT 101* 218* 221*  ALKPHOS 74 96 111  BILITOT 0.8 0.6 0.6  PROT 5.7* 3.6* 6.1*  ALBUMIN  2.0* 1.8* 2.0*  2.1*   No results for input(s): LIPASE, AMYLASE  in the last 168 hours. No results for input(s): AMMONIA in the last 168 hours. CBC: Recent Labs  Lab 08/06/24 0320 08/07/24 0847 08/08/24 1044 08/09/24 0325 08/10/24 0336  WBC 15.7* 15.1* 11.9* 9.7 7.4  HGB 12.3* 11.4* 10.5* 9.1* 9.0*  HCT 37.1* 34.4* 31.6* 26.5* 26.2*  MCV 90.5 91.0 90.8 88.6 88.2  PLT 155 141* 144* 140* 130*   Cardiac Enzymes: Recent Labs  Lab 08/05/24 1741  CKTOTAL 375   CBG: Recent Labs  Lab 08/10/24 1119 08/10/24 1709 08/10/24 2109 08/11/24 0640 08/11/24 0830  GLUCAP 381* 249* 220*  143* 310*    Iron Studies: No results for input(s): IRON, TIBC, TRANSFERRIN, FERRITIN in the last 72 hours. Studies/Results: No results found.  aspirin EC  81 mg Oral Daily   atorvastatin  80 mg Oral Daily   Chlorhexidine  Gluconate Cloth  6 each Topical Daily   gabapentin   300 mg Oral BID   guaiFENesin  600 mg Oral BID   heparin injection (subcutaneous)  5,000 Units Subcutaneous Q8H   insulin  aspart  0-15 Units Subcutaneous TID WC   insulin  aspart  0-5 Units Subcutaneous QHS   levothyroxine   75 mcg Oral Q0600   midodrine  10 mg Oral TID WC   pantoprazole  40 mg Oral Daily   tamsulosin   0.4 mg Oral Daily    BMET    Component Value Date/Time   NA 132 (L) 08/11/2024 0245   NA 139 10/19/2018 0000   K 4.0 08/11/2024 0245   CL 97 (L) 08/11/2024 0245   CO2 22 08/11/2024 0245   GLUCOSE 131 (H) 08/11/2024 0245   BUN 68 (H) 08/11/2024 0245   BUN 25 (A) 04/15/2019 0000   CREATININE 3.79 (H) 08/11/2024 0245   CREATININE 1.95 (H) 04/19/2024 1330   CALCIUM  8.2 (L) 08/11/2024 0245   GFRNONAA 15 (L) 08/11/2024 0245   GFRNONAA 33 (L) 04/19/2024 1330   GFRAA 43 (L) 06/25/2020 0535   CBC    Component Value Date/Time   WBC 7.4 08/10/2024 0336   RBC 2.97 (L) 08/10/2024 0336   HGB 9.0 (L) 08/10/2024 0336   HGB 11.9 (L) 04/19/2024 1330   HCT 26.2 (L) 08/10/2024 0336   PLT 130 (L) 08/10/2024 0336   PLT 154 04/19/2024 1330   MCV 88.2 08/10/2024 0336    MCH 30.3 08/10/2024 0336   MCHC 34.4 08/10/2024 0336   RDW 14.6 08/10/2024 0336   LYMPHSABS 1.5 04/19/2024 1330   MONOABS 0.6 04/19/2024 1330   EOSABS 0.3 04/19/2024 1330   BASOSABS 0.1 04/19/2024 1330   HPI: William Wells is an 89 y.o. male with a history of CKD 3B followed by Dr. Rachele, hypertension, hyperlipidemia, MGUS, lung cancer status post robotic assisted lobectomy in 2021, type 2 diabetes presenting with chest pain, weakness, fatigue.  Patient states that the pain started 10 days prior to admission lasting about 30 minutes multiple times throughout the day but intensified over the past 4 days prior to admission occurring at rest midsternal associated with chest tightness and not exertional.  During initial evaluation chest pain increasing to 10/10 with second-degree AV block and troponin noted to be 22,000.  Echo showed EF of 50 to 55% with inferior akinesis treated with nitroglycerin as needed, initially heparin, aspirin/Plavix and a statin.  Patient also treated with dopamine and norepi during his hospitalization for cardiogenic shock which has been since weaned off.  Patient also had a Foley in place because of concerns for obstruction which was discontinued on 11//25. Denies NSAID use or any family members on dialysis.   Assessment/Plan: Acute renal failure on CKD3b with a baseline creatinine in the 1.9-2.1 range followed by Dr. Rachele with blood pressure which has dropped as lot as 89/51, most of his pressures are in the 106-115/50 range.   - AKI likely due to combination of ischemic ATN due to hypotension and BOO - Biut will 1st check PVR bladder scan -> >250 ml -> 500 mL s/p foley placement 08/10/24 with marked increase in UOP and improvement of BUN/Cr to 3.79 (from peak of 4.2)   -  Fortunately no absolute indication for dialysis at this time. - Will follow closely with you; check urinalysis and urine lytes.    -Monitor Daily I/Os, Daily weight  -Maintain MAP>65 for optimal  renal perfusion.  - Avoid nephrotoxic agents such as IV contrast, NSAIDs, and phosphate containing bowel preps (FLEETS)     Cardiogenic shock - now weaned off pressor support, conversing, good appetite. . 2nd degree AV block seen by EP with no pacemaker currently recommended bec his conduction has improved.  Bladder outlet obstruction with urinary retention - repeat bladder scan PVR increasing and s/p foley catheter placement 08/10/24 with marked improvement of UOP and Scr. Will need outpatient follow up with Urology after discharge. BPH on tamsulosin  T2DM - HbA1c 7.1 Hypertension COPD with emphysema  Fairy RONAL Sellar, MD [facility]

## 2024-08-11 NOTE — Progress Notes (Signed)
 Patient ID: William Wells, male   DOB: 07-27-1934, 89 y.o.   MRN: [id number]     Advanced Heart Failure Rounding Note  Cardiologist: Diannah SHAUNNA Maywood, MD   Chief Complaint: Heart block  Subjective:    Off all pressors. On midodrine 10 mg TID.   Leukocytosis resolved. Now afebrile. Remains on empiric ceftriaxone. Cultures with no growth so far.   Scr 3>3.68>4.08>4.2>3.99>3.79.   Resting in bed. Family at bedside.   Objective:   Echo 08/06/24: EF 50-55%, inferior akinesis, RV normal, PASP 57 mmHg.   Weight Range: 80.5 kg Body mass index is 26.98 kg/m.   Vital Signs:   Temp:  [98.1 F (36.7 C)-99.3 F (37.4 C)] 98.3 F (36.8 C) (11/06 0400) Pulse Rate:  [44-71] 67 (11/06 0600) Resp:  [13-24] 19 (11/06 0600) BP: (89-126)/(38-97) 126/51 (11/06 0600) SpO2:  [91 %-99 %] 94 % (11/06 0600) Weight:  [80.5 kg] 80.5 kg (11/06 0600) Last BM Date : 08/08/24  Weight change: Filed Weights   08/09/24 0909 08/10/24 0500 08/11/24 0600  Weight: 84 kg 84.4 kg 80.5 kg    Intake/Output:   Intake/Output Summary (Last 24 hours) at 08/11/2024 0805 Last data filed at 08/11/2024 0700 Gross per 24 hour  Intake 360 ml  Output 6395 ml  Net -6035 ml    Physical Exam    General:  elderly appearing.  No respiratory difficulty Neck: JVD ~7 cm.  Cor: Irregular rate & rhythm. No murmurs. Lungs: clear Extremities: no edema  Neuro: resting  Telemetry   Wenckeback 60s, intt 2:1 / 3:1 HB (Personally reviewed)    Medications:     Scheduled Medications:  aspirin EC  81 mg Oral Daily   atorvastatin  80 mg Oral Daily   Chlorhexidine  Gluconate Cloth  6 each Topical Daily   gabapentin   300 mg Oral BID   guaiFENesin  600 mg Oral BID   heparin injection (subcutaneous)  5,000 Units Subcutaneous Q8H   insulin  aspart  0-15 Units Subcutaneous TID WC   insulin  aspart  0-5 Units Subcutaneous QHS   levothyroxine   75 mcg Oral Q0600   midodrine  10 mg Oral TID WC   pantoprazole  40 mg Oral Daily    tamsulosin   0.4 mg Oral Daily    Infusions:  cefTRIAXone (ROCEPHIN)  IV Stopped (08/10/24 1930)   norepinephrine (LEVOPHED) Adult infusion Stopped (08/09/24 0946)    PRN Medications: acetaminophen , Gerhardt's butt cream, nitroGLYCERIN, ondansetron  (ZOFRAN ) IV, mouth rinse, oxyCODONE     Patient Profile   Patient is a 89 y.o. male with a PMH of CKD, HTN, lung cancer s/p lobectomy who presented with chest pain and complete heart block.  Assessment/Plan  Complete heart block: Currently 2:1 AVB, 40s. Etiology of CHB is inferior MI.   - It is possible that rhythm may improve since CHB triggered by inferior MI.  - EP following, plan for possible PPM  Cardiogenic shock - In setting of MI - Echo as below - Now off vasopressors - Responded well to lasix yesterday. Suspect transition to PO today.  - GDMT limited by BP and renal function  Inferior MI: Troponin significantly elevated at 22K on arrival and downtrending. Suspect missed MI, no actionable STEMI on EKG, previously discussed with interventional and managed medically given his worsening CKD and age. Echo showed EF 50-55%, inferior akinesis, RV normal, PASP 57 mmHg.  Suspect RCA occlusion.   - Prn nitroglycerin for chest pain - Off heparin - Continue aspirin/plavix  - Atorvastatin 80 daily.  AKI on CKD: Suspect due to shock as well as obstruction, urine output increased after Foley placement. Baseline creatinine 1.8.  - Creatinine 3>3.7>4.1>4.2>3.99>3.79 today.  - Now on flomax . Foley removed 11/4. Minimal UOP and high PVR. Foley replaced 11/5 - Nephrology following - Keep SBP > 100  Hypotension - Fever and leukocytosis resolved. Empiric ceftriaxone. Cultures negative so far. - Improved. Off pressors. Now on midodrine 10 TID  PT/OT following   CRITICAL CARE Performed by: Beckey LITTIE Coe   Total critical care time: 13 minutes  Critical care time was exclusive of separately billable procedures and treating other  patients.  Critical care was necessary to treat or prevent imminent or life-threatening deterioration.  Critical care was time spent personally by me on the following activities: development of treatment plan with patient and/or surrogate as well as nursing, discussions with consultants, evaluation of patient's response to treatment, examination of patient, obtaining history from patient or surrogate, ordering and performing treatments and interventions, ordering and review of laboratory studies, ordering and review of radiographic studies, pulse oximetry and re-evaluation of patient's condition.    Length of Stay: 6  Beckey LITTIE Coe, NP  08/11/2024, 8:05 AM  Advanced Heart Failure Team Pager [phone] (M-F; 7a - 5p)  Please contact [facility] for night-coverage after hours (5p -7a ) and weekends on [email or web address]

## 2024-08-11 NOTE — Progress Notes (Signed)
 Physical Therapy Treatment Patient Details Name: William Wells MRN: [id number] DOB: Feb 25, 1934 Today's Date: 08/11/2024   History of Present Illness Pt is an 89 y/o M presenting to ED on 10/31 wtih chest pressure, generalized weakness, fall, CT negative, EKG with ST elevation, evidence of AV block, suspect missing MI. Plan for possible PPM. Also with AKI. PMH includes lung CA s/p robotic assisted upper lobectomy, HTN, DM2, CKD IIIB    PT Comments  Eager to mobilize with therapy today. Pt required min assist for bed mobility and transfer, demonstrating posterior instability. Multimodal cues needed to facilitate forward weight shift and appropriate use of RW to maximize support. Pt fatigued quickly with ambulatory bout, demonstrating bil LE buckling and urgent need to sit in chair with assist. Diaphoretic and pale, but denied dizziness and BP 135/60 after sitting. Recovered within a few minutes. Moderate dyspnea with exertion on 2L SpO2 90% and greater. Educated on LE exercises. Encouraged OOB often with staff, and self pressure redistribution techniques. Daughter present and supportive during visit. Patient will benefit from intensive inpatient follow-up therapy, >3 hours/day. Patient will continue to benefit from skilled physical therapy services to further improve independence with functional mobility.    If plan is discharge home, recommend the following: A lot of help with walking and/or transfers;A lot of help with bathing/dressing/bathroom;Assistance with cooking/housework;Assist for transportation;Help with Wells or ramp for entrance   Can travel by private vehicle        Equipment Recommendations  None recommended by PT    Recommendations for Other Services Rehab consult     Precautions / Restrictions Precautions Precautions: Fall Recall of Precautions/Restrictions: Intact Precaution/Restrictions Comments: watch O2 Restrictions Weight Bearing Restrictions Per Provider Order: No      Mobility  Bed Mobility Overal bed mobility: Needs Assistance Bed Mobility: Rolling, Sidelying to Sit Rolling: Min assist, Used rails Sidelying to sit: Min assist, Used rails, HOB elevated       General bed mobility comments: Educated on technique, needs some assist to bring LEs out of bed and min assist to rise. Cues for rail use.    Transfers Overall transfer level: Needs assistance Equipment used: Rolling walker (2 wheels) Transfers: Sit to/from Stand Sit to Stand: Min assist           General transfer comment: Min assist for boost to stand from bed and standard chair with cues for hand placement and RW to steady upon standing. Pt with posterior lean, needs assist to facilitate forward weight shift.    Ambulation/Gait Ambulation/Gait assistance: Mod assist Gait Distance (Feet): 18 Feet Assistive device: Rolling walker (2 wheels) Gait Pattern/deviations: Step-through pattern, Decreased stride length, Knees buckling, Shuffle, Leaning posteriorly, Trunk flexed, Narrow base of support Gait velocity: dec Gait velocity interpretation: <1.31 ft/sec, indicative of household ambulator Pre-gait activities: static march General Gait Details: Educated on safe AD use with RW, needs assist to lean forward intermittently due to posterior instability. Moderate dyspnea with SpO2 90% and greater on 2L supplemental O2. HR into upper 60s at peak. Pts legs began to buckle as he fatigued rapidly. Required to sit in chair before completing targeted distance. Required max cues for RW use, including assist for navigating forward, turning, and mod assist for balance.   Wells             Wheelchair Mobility     Tilt Bed    Modified Rankin (Stroke Patients Only)       Balance Overall balance assessment: Needs assistance Sitting-balance support: Feet  supported, No upper extremity supported Sitting balance-Leahy Scale: Good     Standing balance support: During functional  activity, Reliant on assistive device for balance, Bilateral upper extremity supported Standing balance-Leahy Scale: Poor Standing balance comment: Posterior LOB                            Communication Communication Communication: No apparent difficulties  Cognition Arousal: Alert Behavior During Therapy: WFL for tasks assessed/performed   PT - Cognitive impairments: No apparent impairments                         Following commands: Intact      Cueing Cueing Techniques: Verbal cues  Exercises General Exercises - Lower Extremity Ankle Circles/Pumps: AROM, Both, 10 reps, Supine Quad Sets: Both, Supine, 10 reps, Strengthening Gluteal Sets: Strengthening, Both, 10 reps, Supine    General Comments General comments (skin integrity, edema, etc.): Resting HR in 50s. Up to 68 with exertion with moderate dyspnea. SPO2 90% and greater on 2L. BP 135/60 in seated position but did become pale and diaphoretic while ambulating.      Pertinent Vitals/Pain Pain Assessment Pain Assessment: No/denies pain    Home Living                          Prior Function            PT Goals (current goals can now be found in the care plan section) Acute Rehab PT Goals Patient Stated Goal: to go home PT Goal Formulation: With patient Time For Goal Achievement: 08/24/24 Potential to Achieve Goals: Fair Progress towards PT goals: Progressing toward goals    Frequency    Min 2X/week      PT Plan      Co-evaluation              AM-PAC PT 6 Clicks Mobility   Outcome Measure  Help needed turning from your back to your side while in a flat bed without using bedrails?: A Little Help needed moving from lying on your back to sitting on the side of a flat bed without using bedrails?: A Little Help needed moving to and from a bed to a chair (including a wheelchair)?: A Lot Help needed standing up from a chair using your arms (e.g., wheelchair or bedside  chair)?: A Little Help needed to walk in hospital room?: A Lot Help needed climbing 3-5 steps with a railing? : Total 6 Click Score: 14    End of Session Equipment Utilized During Treatment: Gait belt;Oxygen Activity Tolerance: Patient limited by fatigue Patient left: with call bell/phone within reach;in chair;with chair alarm set;with family/visitor present;with SCD's reapplied Nurse Communication: Mobility status (LEs buckle) PT Visit Diagnosis: Muscle weakness (generalized) (M62.81);Unsteadiness on feet (R26.81);Other abnormalities of gait and mobility (R26.89);Difficulty in walking, not elsewhere classified (R26.2)     Time: 8451-8376 PT Time Calculation (min) (ACUTE ONLY): 35 min  Charges:    $Gait Training: 8-22 mins $Therapeutic Activity: 8-22 mins PT General Charges $$ ACUTE PT VISIT: 1 Visit                     Leontine Roads, PT, DPT [facility]  Rehabilitation Services Physical Therapist Office: [phone] Website: [email or web address]    Leontine GORMAN Roads 08/11/2024, 4:52 PM

## 2024-08-11 NOTE — Plan of Care (Signed)

## 2024-08-12 DIAGNOSIS — I2119 ST elevation (STEMI) myocardial infarction involving other coronary artery of inferior wall: Secondary | ICD-10-CM | POA: Diagnosis not present

## 2024-08-12 LAB — CBC
HCT: 28.7 % — ABNORMAL LOW (ref 39.0–52.0)
Hemoglobin: 9.8 g/dL — ABNORMAL LOW (ref 13.0–17.0)
MCH: 29.7 pg (ref 26.0–34.0)
MCHC: 34.1 g/dL (ref 30.0–36.0)
MCV: 87 fL (ref 80.0–100.0)
Platelets: 215 K/uL (ref 150–400)
RBC: 3.3 MIL/uL — ABNORMAL LOW (ref 4.22–5.81)
RDW: 14.7 % (ref 11.5–15.5)
WBC: 7.8 K/uL (ref 4.0–10.5)
nRBC: 0 % (ref 0.0–0.2)

## 2024-08-12 LAB — GLUCOSE, CAPILLARY
Glucose-Capillary: 276 mg/dL — ABNORMAL HIGH (ref 70–99)
Glucose-Capillary: 225 mg/dL — ABNORMAL HIGH (ref 70–99)
Glucose-Capillary: 255 mg/dL — ABNORMAL HIGH (ref 70–99)
Glucose-Capillary: 163 mg/dL — ABNORMAL HIGH (ref 70–99)

## 2024-08-12 LAB — RENAL FUNCTION PANEL
Albumin: 2.1 g/dL — ABNORMAL LOW (ref 3.5–5.0)
Anion gap: 14 (ref 5–15)
BUN: 65 mg/dL — ABNORMAL HIGH (ref 8–23)
CO2: 21 mmol/L — ABNORMAL LOW (ref 22–32)
Calcium: 8.3 mg/dL — ABNORMAL LOW (ref 8.9–10.3)
Chloride: 100 mmol/L (ref 98–111)
Creatinine, Ser: 3.5 mg/dL — ABNORMAL HIGH (ref 0.61–1.24)
GFR, Estimated: 16 mL/min — ABNORMAL LOW (ref >60)
Glucose, Bld: 154 mg/dL — ABNORMAL HIGH (ref 70–99)
Phosphorus: 3.2 mg/dL (ref 2.5–4.6)
Potassium: 4.4 mmol/L (ref 3.5–5.1)
Sodium: 135 mmol/L (ref 135–145)

## 2024-08-12 MED ORDER — CLOPIDOGREL BISULFATE 75 MG PO TABS
75.0000 mg | ORAL_TABLET | Freq: Every day | ORAL | Status: DC
  Administered 2024-08-12 – 2024-08-17 (×6): 75 mg via ORAL
  Filled 2024-08-12 (×6): qty 1

## 2024-08-12 NOTE — Progress Notes (Signed)
 EP brief tele note   Patient remains in wenckebach with rates 50-70s.  Cr slightly improving.  No PPM indicated at this time.   Discussed with patient and daughter at beside.  If patient develops CHB or chronotropic incompetence with activity/rehab, can reconsider PPM.    EP will sign off at this time but remains available. Please reconsult if needed    Tamir Wallman, NP Electrophysiology 08/12/24 8:39 AM

## 2024-08-12 NOTE — Progress Notes (Signed)
 Inpatient Rehab Admissions Coordinator:    I met with Pt. To discuss potential CIR admit. He states that he is interested. Daughters present and confirmed they can provide 24/7 assist at d/c. I will follow for potential CIR admit pending insurance auth.   Leita Kleine, MS, CCC-SLP Rehab Admissions Coordinator  [phone] (celll) [phone] (office)

## 2024-08-12 NOTE — Consult Note (Signed)
 William Wells is a 89 y.o. year old male  who  has a past medical history of Acid reflux disease, Arthritis, Complication of anesthesia, Diabetes mellitus, Dyspnea, Family history of adverse reaction to anesthesia, Hiatal hernia, High cholesterol, Hypertension, Hypothyroidism, Lung cancer (HCC), Monoclonal gammopathy of unknown significance (MGUS) (03/28/2020), Normocytic anemia (03/28/2020), Renal disorder, and Type 2 diabetes mellitus (HCC).   He presented to [facility] on 10-31 for symptoms of exertional chest pressure lasting 30 minutes multiple times throughout the day, for approximately 10 days, progressing to symptoms at rest.  In the ER, EKG showed severe sinus bradycardia with subtle ST elevations in the inferior leads and second-degree AV block, along with troponin elevation to 22,000.  He received a 500 mL fluid bolus in the ER and cardiology was consulted, patient loaded with aspirin and Plavix and a high intensity statin, started on a heparin drip.  Noted need to avoid nitroglycerin and inferior STEMI.  Due to serum creatinine 3.47, interventional cardiology Dr. Wendel felt risks of dialysis with PCI were too high, and patient was treated medically.  He was started on norepinephrine and dopamine drips and admitted to the ICU.  He has been gradually weaned off of pressors and is now stable on midodrine 10 mg 3 times daily.  Hospitalization was complicated by complete heart block with EP following, no plan for PPM; cardiogenic shock managed with IV Lasix and pressors; inferior MI with RCA occlusion now managed with as needed nitroglycerin and off of heparin; AKI on CKD3B with nephrology following determine likely due to ATN and obstructive uropathy; urinary retention with failed DC Foley trial 11-4, likely need for long-term catheter per nephrology; hypotension; diabetes with hyperglycemia; new oxygen requirement; and fever of unknown origin managed with empiric ceftriaxone with improving  lactic leukocytosis and negative cultures.  Physical medicine and rehabilitation was consulted to evaluate for appropriateness for inpatient rehab admission.  Per chart review, patient is living in a private residence with plan to discharge with assistance from his daughter available intermittently.  He he was in a 1 level home with a ramped entrance and walked in shower, has a shower seat and uses a single-point cane and a rollator for longer distances at baseline.  Per family, he is independent of ADLs.  Currently, he is min assist for bed mobility and transfers secondary to posterior instability, ambulates 18 feet with a rolling walker at a mod assist level, fatigues quickly with ambulation attempts and desaturates to 90% on 2 L nasal cannula.  He is moderate assist for upper and lower body dressings, moderate assist +2 for toileting, and min assist to set up for grooming and feeding.   Based on the above review, patient seems to be a good candidate for inpatient rehab.  He has a good home set up with a single floor and a ramp, and intermittent family assistance available, prior to admission was operating at a modified independent independent level.  Currently, he is unable to return home due to deconditioning secondary to CAD/MI as above, complicated by renal failure and obstructive uropathy now requiring likely long-term Foley catheter.  Rehab program with focus on conditioning and ambulatory tolerance, as well as independence and management of Foley catheter and oxygen if ongoing requirement.  Additionally, he remains on medications to support his blood pressure to accommodate ongoing diuresis secondary to heart failure, which will need to be closely monitored and titrated.  Due to this, feel he meets proper medical complexity for inpatient rehab  admission, with anticipated length of stay 10 to 14 days with skilled PT/OT services.   William JAYSON Likes, DO 08/12/2024

## 2024-08-12 NOTE — Inpatient Diabetes Management (Signed)
 Inpatient Diabetes Program Recommendations  AACE/ADA: New Consensus Statement on Inpatient Glycemic Control (2015)  Target Ranges:  Prepandial:   less than 140 mg/dL      Peak postprandial:   less than 180 mg/dL (1-2 hours)      Critically ill patients:  140 - 180 mg/dL   Lab Results  Component Value Date   GLUCAP 276 (H) 08/12/2024   HGBA1C 7.1 (H) 08/05/2024    Review of Glycemic Control  Latest Reference Range & Units 08/10/24 21:09 08/11/24 06:40 08/11/24 08:30 08/11/24 11:34 08/11/24 16:28 08/11/24 21:16 08/12/24 07:55  Glucose-Capillary 70 - 99 mg/dL 779 (H) 856 (H) 689 (H) 260 (H) 175 (H) 239 (H) 276 (H)  (H): Data is abnormally high Diabetes history: DM2 Outpatient Diabetes medications:  Glipizide  2.5 mg QD Current orders for Inpatient glycemic control:  Novolog  0-15 units TID and 0-5 units QHS   Inpatient Diabetes Program Recommendations:    Consider adding Semglee 7 units at bedtime.   Thanks, Tinnie Minus, MSN, RNC-OB Diabetes Coordinator [phone] (8a-5p)

## 2024-08-12 NOTE — Progress Notes (Signed)
 Admit: 08/05/2024 LOS: 7  67M AKI on CKD 3B followed by Dr. Rachele as an outpatient, baseline creatinine 1.9-2.1 with likely ATN and obstructive uropathy.  Here with cardiogenic shock and secondary AV block followed by cardiology  Subjective:  Creatinine improved from 3.8-3.5, BUN slightly improved, K4.4 and bicarbonate to 21. 2.8 liters of urine in past 24 hours On Flomax , Foley catheter present  Blood pressures are stable. Family at bedside updated  11/06 0701 - 11/07 0700 In: 179 [IV Piggyback:179] Out: 2820 [Urine:2820]  Filed Weights   08/10/24 0500 08/11/24 0600 08/12/24 0500  Weight: 84.4 kg 80.5 kg 80.4 kg    Scheduled Meds:  aspirin EC  81 mg Oral Daily   atorvastatin  80 mg Oral Daily   Chlorhexidine  Gluconate Cloth  6 each Topical Daily   gabapentin   300 mg Oral BID   guaiFENesin  600 mg Oral BID   heparin injection (subcutaneous)  5,000 Units Subcutaneous Q8H   insulin  aspart  0-15 Units Subcutaneous TID WC   insulin  aspart  0-5 Units Subcutaneous QHS   levothyroxine   75 mcg Oral Q0600   midodrine  10 mg Oral TID WC   pantoprazole  40 mg Oral Daily   tamsulosin   0.4 mg Oral Daily   Continuous Infusions:  cefTRIAXone (ROCEPHIN)  IV 200 mL/hr at 08/11/24 1700   PRN Meds:.acetaminophen , Gerhardt's butt cream, nitroGLYCERIN, ondansetron  (ZOFRAN ) IV, mouth rinse, oxyCODONE   Current Labs: reviewed    Physical Exam:  Blood pressure 116/68, pulse 62, temperature 98.1 F (36.7 C), temperature source Oral, resp. rate 17, height 5' 8 (1.727 m), weight 80.4 kg, SpO2 93%. In chair, awake alert and eating breakfast Bradycardic, regular Clear bilaterally on anterior auscultation No peripheral edema Soft, nontender  A AKI on CKD 3 from ATN and obstructive uropathy: Slowly improving with replacement of Foley catheter and on Flomax .  Likely will need more long-term catheter as already failed Foley removal during this admission. Second-degree AV block followed by  cardiology/EP with no current plan for PPM Cardiogenic shock followed by AHF  P Continue supportive care, Foley catheter, Flomax  Anticipate further GFR improvement in time Medication Issues; Preferred narcotic agents for pain control are hydromorphone , fentanyl , and methadone. Morphine  should not be used.  Baclofen should be avoided Avoid oral sodium phosphate  and magnesium citrate based laxatives / bowel preps   Bernardino Gasman MD 08/12/2024, 7:10 AM  Recent Labs  Lab 08/10/24 1346 08/11/24 0245 08/12/24 0327  NA 129* 132* 135  K 3.7 4.0 4.4  CL 95* 97* 100  CO2 21* 22 21*  GLUCOSE 258* 131* 154*  BUN 72* 68* 65*  CREATININE 3.99* 3.79* 3.50*  CALCIUM  8.1* 8.2* 8.3*  PHOS  --  4.2 3.2   Recent Labs  Lab 08/09/24 0325 08/10/24 0336 08/12/24 0327  WBC 9.7 7.4 7.8  HGB 9.1* 9.0* 9.8*  HCT 26.5* 26.2* 28.7*  MCV 88.6 88.2 87.0  PLT 140* 130* 215

## 2024-08-12 NOTE — Progress Notes (Signed)
 Patient ID: William Wells, male   DOB: 01/30/1934, 89 y.o.   MRN: [id number]     Advanced Heart Failure Rounding Note  Cardiologist: Diannah SHAUNNA Maywood, MD   Chief Complaint: Heart block  Subjective:    Off all pressors. On midodrine 10 mg TID.   Leukocytosis resolved. Now afebrile. Remains on empiric ceftriaxone. Cultures with no growth so far.   Scr 3>3.68>4.08>4.2>3.99>3.79.   Resting in bed. Family at bedside.   Objective:   Echo 08/06/24: EF 50-55%, inferior akinesis, RV normal, PASP 57 mmHg.   Weight Range: 80.4 kg Body mass index is 26.96 kg/m.   Vital Signs:   Temp:  [97.2 F (36.2 C)-98.8 F (37.1 C)] 97.2 F (36.2 C) (11/07 0700) Pulse Rate:  [47-84] 65 (11/07 1000) Resp:  [13-25] 21 (11/07 1000) BP: (91-135)/(40-92) 108/44 (11/07 1000) SpO2:  [89 %-98 %] 96 % (11/07 1000) Weight:  [80.4 kg] 80.4 kg (11/07 0500) Last BM Date : 08/10/24  Weight change: Filed Weights   08/10/24 0500 08/11/24 0600 08/12/24 0500  Weight: 84.4 kg 80.5 kg 80.4 kg    Intake/Output:   Intake/Output Summary (Last 24 hours) at 08/12/2024 1031 Last data filed at 08/12/2024 0800 Gross per 24 hour  Intake 179.04 ml  Output 2895 ml  Net -2715.96 ml    Physical Exam    General:  elderly appearing.  No respiratory difficulty Neck: JVD ~7 cm.  Cor: Irregular rate & rhythm. No murmurs. Lungs: clear Extremities: no edema  Neuro: resting  Telemetry   Wenckeback 60s, intt 2:1 / 3:1 HB (Personally reviewed)    Medications:     Scheduled Medications:  aspirin EC  81 mg Oral Daily   atorvastatin  80 mg Oral Daily   Chlorhexidine  Gluconate Cloth  6 each Topical Daily   gabapentin   300 mg Oral BID   guaiFENesin  600 mg Oral BID   heparin injection (subcutaneous)  5,000 Units Subcutaneous Q8H   insulin  aspart  0-15 Units Subcutaneous TID WC   insulin  aspart  0-5 Units Subcutaneous QHS   levothyroxine   75 mcg Oral Q0600   midodrine  10 mg Oral TID WC   pantoprazole  40 mg  Oral Daily   tamsulosin   0.4 mg Oral Daily    Infusions:  cefTRIAXone (ROCEPHIN)  IV 200 mL/hr at 08/11/24 1700    PRN Medications: acetaminophen , Gerhardt's butt cream, nitroGLYCERIN, ondansetron  (ZOFRAN ) IV, mouth rinse, oxyCODONE     Patient Profile   Patient is a 89 y.o. male with a PMH of CKD, HTN, lung cancer s/p lobectomy who presented with chest pain and complete heart block.  Assessment/Plan  Complete heart block: Currently 2:1 AVB, 40s. Etiology of CHB is inferior MI.   - It is possible that rhythm may improve since CHB triggered by inferior MI.  - EP following, no plan for PPM at this time. Signed off 11/7  Cardiogenic shock - In setting of MI - Echo as below - Now off vasopressors - Diuresed well with IV lasix. Will hold off on additional diuretics today. Can use PRN as needed.  - GDMT limited by BP and renal function  Inferior MI: Troponin significantly elevated at 22K on arrival and downtrending. Suspect missed MI, no actionable STEMI on EKG, previously discussed with interventional and managed medically given his worsening CKD and age. Echo showed EF 50-55%, inferior akinesis, RV normal, PASP 57 mmHg.  Suspect RCA occlusion.   - Prn nitroglycerin for chest pain - Off heparin - Continue  aspirin/plavix  - Atorvastatin 80 daily.   AKI on CKD: Suspect due to shock as well as obstruction, urine output increased after Foley placement. Baseline creatinine 1.8.  - Creatinine 3>3.7>4.1>4.2>3.99>3.79>3.5 today.  - Now on flomax . Foley removed 11/4. Minimal UOP and high PVR, suspect obstructive component.  Foley replaced 11/5, good UOP.  - Nephrology following - Suspect he will need foley at discharge and OP urology f/u.  - Keep SBP > 100  Hypotension - Fever and leukocytosis resolved. Empiric ceftriaxone. Cultures negative so far. - Improved. Off pressors. Now on midodrine 10 TID  PT/OT following  Plan for floor tx today. [facility] to take over as primary and [facility] to  see in the periphery. Does not need f/u in AHF clinic.   Length of Stay: 7  Beckey LITTIE Coe, NP  08/12/2024, 10:31 AM  Advanced Heart Failure Team Pager [phone] (M-F; 7a - 5p)  Please contact [facility] for night-coverage after hours (5p -7a ) and weekends on [email or web address]

## 2024-08-12 NOTE — Progress Notes (Signed)
 Occupational Therapy Treatment Patient Details Name: William Wells MRN: [id number] DOB: 12/29/1933 Today's Date: 08/12/2024   History of present illness Pt is an 89 y/o M presenting to ED on 10/31 wtih chest pressure, generalized weakness, fall, CT negative, EKG with ST elevation, evidence of AV block, suspect missing MI. Plan for possible PPM. Also with AKI. PMH includes lung CA s/p robotic assisted upper lobectomy, HTN, DM2, CKD IIIB   OT comments  Pt pleasant and remains eager to work with therapies. Pt in high spirits with family and his dog, Jet, present at time of session. Pt requires CGA for bed mobility, Min A to stand with RW and Min A x 2 for safety with BSC transfers and subsequent mobility approx 82ft today. Consistent cues for hand placement and pacing beneficial. Continue to feel pt would make excellent progress with intensive rehab services.  HR at rest 51bpm, up to 71bpm with activity      If plan is discharge home, recommend the following:  Two people to help with walking and/or transfers;A lot of help with bathing/dressing/bathroom;Direct supervision/assist for medications management;Assist for transportation;Direct supervision/assist for financial management;Supervision due to cognitive status   Equipment Recommendations  Other (comment) (TBD pending progress)    Recommendations for Other Services Rehab consult    Precautions / Restrictions Precautions Precautions: Fall Recall of Precautions/Restrictions: Intact Precaution/Restrictions Comments: watch O2/HR Restrictions Weight Bearing Restrictions Per Provider Order: No       Mobility Bed Mobility Overal bed mobility: Needs Assistance Bed Mobility: Supine to Sit     Supine to sit: Contact guard, HOB elevated, Used rails     General bed mobility comments: CGA  to ensure truncal balance. good effort reaching over to bedrail and bring LE to EOB    Transfers Overall transfer level: Needs assistance Equipment  used: Rolling walker (2 wheels) Transfers: Sit to/from Stand, Bed to chair/wheelchair/BSC Sit to Stand: Min assist     Step pivot transfers: Min assist, +2 safety/equipment     General transfer comment: Min A to stand at bedside, +2 for safety to pivot to [employer] using RW. Consistent cues needed for hand placement during transitional movements.     Balance Overall balance assessment: Needs assistance Sitting-balance support: Feet supported, No upper extremity supported Sitting balance-Leahy Scale: Good     Standing balance support: During functional activity, Reliant on assistive device for balance, Bilateral upper extremity supported Standing balance-Leahy Scale: Poor                             ADL either performed or assessed with clinical judgement   ADL Overall ADL's : Needs assistance/impaired                 Upper Body Dressing : Minimal assistance;Sitting       Toilet Transfer: Minimal assistance;Stand-pivot;+2 for safety/equipment;BSC/3in1;Rolling walker (2 wheels) Toilet Transfer Details (indicate cue type and reason): cues for hand placement and step sequence, some unsteadiness noted         Functional mobility during ADLs: Minimal assistance;+2 for safety/equipment;Rolling walker (2 wheels);Cueing for sequencing General ADL Comments: able to walk further than prior PT session yesterday (around 30 ft today), LE still with shakiness so close chair follow needed.    Extremity/Trunk Assessment Upper Extremity Assessment Upper Extremity Assessment: Generalized weakness;Right hand dominant   Lower Extremity Assessment Lower Extremity Assessment: Defer to PT evaluation        Vision   Vision  Assessment?: No apparent visual deficits   Perception     Praxis     Communication Communication Communication: No apparent difficulties   Cognition Arousal: Alert Behavior During Therapy: WFL for tasks assessed/performed Cognition: No apparent  impairments                               Following commands: Intact        Cueing   Cueing Techniques: Verbal cues  Exercises      Shoulder Instructions       General Comments Family at bedside and had brought pt's small dog, Jet    Pertinent Vitals/ Pain       Pain Assessment Pain Assessment: No/denies pain  Home Living                                          Prior Functioning/Environment              Frequency  Min 2X/week        Progress Toward Goals  OT Goals(current goals can now be found in the care plan section)  Progress towards OT goals: Progressing toward goals  Acute Rehab OT Goals Patient Stated Goal: be able to get home with Jet OT Goal Formulation: With patient Time For Goal Achievement: 08/24/24 Potential to Achieve Goals: Good ADL Goals Pt Will Perform Upper Body Dressing: sitting;with contact guard assist Pt Will Perform Lower Body Dressing: sitting/lateral leans;sit to/from stand;with contact guard assist Pt Will Transfer to Toilet: with supervision;ambulating Pt Will Perform Tub/Shower Transfer: Shower transfer;with min assist;ambulating;shower seat  Plan      Co-evaluation                 AM-PAC OT 6 Clicks Daily Activity     Outcome Measure   Help from another person eating meals?: None Help from another person taking care of personal grooming?: A Little Help from another person toileting, which includes using toliet, bedpan, or urinal?: A Lot Help from another person bathing (including washing, rinsing, drying)?: A Lot Help from another person to put on and taking off regular upper body clothing?: A Little Help from another person to put on and taking off regular lower body clothing?: A Lot 6 Click Score: 16    End of Session Equipment Utilized During Treatment: Gait belt;Rolling walker (2 wheels);Oxygen  OT Visit Diagnosis: Unsteadiness on feet (R26.81);Other abnormalities of gait  and mobility (R26.89);Muscle weakness (generalized) (M62.81)   Activity Tolerance Patient tolerated treatment well   Patient Left in chair;with call bell/phone within reach;with chair alarm set;with family/visitor present   Nurse Communication Mobility status        Time: 8966-8893 OT Time Calculation (min): 33 min  Charges: OT General Charges $OT Visit: 1 Visit OT Treatments $Self Care/Home Management : 8-22 mins $Therapeutic Activity: 8-22 mins  Mliss NOVAK, OTR/L Acute Rehab Services Office: [phone]   Mliss Fish 08/12/2024, 11:30 AM

## 2024-08-12 NOTE — TOC Progression Note (Signed)
 Transition of Care [facility]) - Progression Note    Patient Details  Name: William Wells MRN: [id number] Date of Birth: 1934-06-22  Transition of Care [facility]) CM/SW Contact  Justina Delcia Czar, RN Phone Number: [phone] 08/12/2024, 1:50 PM  Clinical Narrative:     Pt currently lives with dtr/SIL and they assist him in the home. Takes pt to appts. Pt has a RW at home.   CIR following for IP rehab.    Chart reviewed for discharge readiness, patient not medically stable for d/c. Inpatient CM/CSW will continue to monitor pt's advancement through interdisciplinary progression rounds.    If new pt transition needs arise, MD please place a TOC consult.    Expected Discharge Plan: IP Rehab Facility Barriers to Discharge: Continued Medical Work up  Expected Discharge Plan and Services   Discharge Planning Services: CM Consult   Living arrangements for the past 2 months: Single Family Home                                       Social Drivers of Health (SDOH) Interventions SDOH Screenings   Food Insecurity: No Food Insecurity (08/06/2024)  Housing: Low Risk  (08/06/2024)  Transportation Needs: No Transportation Needs (08/06/2024)  Utilities: Not At Risk (08/06/2024)  Alcohol Screen: Low Risk  (10/25/2020)  Depression (PHQ2-9): Low Risk  (04/19/2024)  Financial Resource Strain: Low Risk  (10/25/2020)  Physical Activity: Sufficiently Active (10/25/2020)  Social Connections: Moderately Isolated (08/06/2024)  Stress: No Stress Concern Present (10/25/2020)  Tobacco Use: Medium Risk (08/06/2024)    Readmission Risk Interventions     No data to display

## 2024-08-13 DIAGNOSIS — I442 Atrioventricular block, complete: Secondary | ICD-10-CM | POA: Diagnosis not present

## 2024-08-13 DIAGNOSIS — N179 Acute kidney failure, unspecified: Secondary | ICD-10-CM | POA: Diagnosis not present

## 2024-08-13 DIAGNOSIS — N189 Chronic kidney disease, unspecified: Secondary | ICD-10-CM | POA: Diagnosis not present

## 2024-08-13 DIAGNOSIS — I959 Hypotension, unspecified: Secondary | ICD-10-CM | POA: Diagnosis not present

## 2024-08-13 LAB — RENAL FUNCTION PANEL
Albumin: 2 g/dL — ABNORMAL LOW (ref 3.5–5.0)
Anion gap: 11 (ref 5–15)
BUN: 62 mg/dL — ABNORMAL HIGH (ref 8–23)
CO2: 22 mmol/L (ref 22–32)
Calcium: 8.4 mg/dL — ABNORMAL LOW (ref 8.9–10.3)
Chloride: 100 mmol/L (ref 98–111)
Creatinine, Ser: 2.85 mg/dL — ABNORMAL HIGH (ref 0.61–1.24)
GFR, Estimated: 21 mL/min — ABNORMAL LOW (ref >60)
Glucose, Bld: 190 mg/dL — ABNORMAL HIGH (ref 70–99)
Phosphorus: 3.1 mg/dL (ref 2.5–4.6)
Potassium: 4.4 mmol/L (ref 3.5–5.1)
Sodium: 133 mmol/L — ABNORMAL LOW (ref 135–145)

## 2024-08-13 LAB — CBC
HCT: 27.6 % — ABNORMAL LOW (ref 39.0–52.0)
Hemoglobin: 9.3 g/dL — ABNORMAL LOW (ref 13.0–17.0)
MCH: 30.1 pg (ref 26.0–34.0)
MCHC: 33.7 g/dL (ref 30.0–36.0)
MCV: 89.3 fL (ref 80.0–100.0)
Platelets: 230 K/uL (ref 150–400)
RBC: 3.09 MIL/uL — ABNORMAL LOW (ref 4.22–5.81)
RDW: 14.9 % (ref 11.5–15.5)
WBC: 9.4 K/uL (ref 4.0–10.5)
nRBC: 0 % (ref 0.0–0.2)

## 2024-08-13 LAB — GLUCOSE, CAPILLARY
Glucose-Capillary: 156 mg/dL — ABNORMAL HIGH (ref 70–99)
Glucose-Capillary: 212 mg/dL — ABNORMAL HIGH (ref 70–99)
Glucose-Capillary: 234 mg/dL — ABNORMAL HIGH (ref 70–99)

## 2024-08-13 LAB — CULTURE, BLOOD (ROUTINE X 2)
Culture: NO GROWTH
Culture: NO GROWTH
Special Requests: ADEQUATE
Special Requests: ADEQUATE

## 2024-08-13 MED ORDER — MIDAZOLAM HCL 2 MG/2ML IJ SOLN
INTRAMUSCULAR | Status: AC
  Filled 2024-08-13: qty 2

## 2024-08-13 MED ORDER — FENTANYL CITRATE (PF) 50 MCG/ML IJ SOSY
PREFILLED_SYRINGE | INTRAMUSCULAR | Status: AC
  Filled 2024-08-13: qty 1

## 2024-08-13 MED ORDER — POLYETHYLENE GLYCOL 3350 17 G PO PACK
17.0000 g | PACK | Freq: Every day | ORAL | Status: DC | PRN
  Filled 2024-08-13: qty 1

## 2024-08-13 MED ORDER — INSULIN GLARGINE-YFGN 100 UNIT/ML ~~LOC~~ SOLN
7.0000 [IU] | Freq: Every day | SUBCUTANEOUS | Status: DC
  Administered 2024-08-13 – 2024-08-23 (×11): 7 [IU] via SUBCUTANEOUS
  Filled 2024-08-13 (×13): qty 0.07

## 2024-08-13 NOTE — Progress Notes (Signed)
 Patient ID: William Wells, male   DOB: [birth year], 89 y.o.   MRN: [id number]     Advanced Heart Failure Rounding Note  Cardiologist: Diannah SHAUNNA Maywood, MD   Chief Complaint: Heart block  Subjective:    Off all pressors. On midodrine 10 mg TID.   Leukocytosis resolved. Now afebrile. Remains on empiric ceftriaxone. Cultures with no growth so far.   Scr 3>3.68>4.08>4.2>3.99>3.79>2.85. Excellent UOP, I/Os net negative 2290.   Patient has type 1 2nd degree AVB alternating with 2:1 AVB, HR 50s-60s.   Resting in bed. Family at bedside.   Objective:   Echo 08/06/24: EF 50-55%, inferior akinesis, RV normal, PASP 57 mmHg.   Weight Range: 79.8 kg Body mass index is 26.75 kg/m.   Vital Signs:   Temp:  [98.1 F (36.7 C)-99.7 F (37.6 C)] 98.1 F (36.7 C) (11/08 0600) Pulse Rate:  [42-74] 50 (11/08 1000) Resp:  [14-24] 19 (11/08 1000) BP: (101-142)/(45-80) 129/66 (11/08 1000) SpO2:  [93 %-98 %] 94 % (11/08 1000) Weight:  [79.8 kg] 79.8 kg (11/08 0530) Last BM Date : 08/08/24  Weight change: Filed Weights   08/11/24 0600 08/12/24 0500 08/13/24 0530  Weight: 80.5 kg 80.4 kg 79.8 kg    Intake/Output:   Intake/Output Summary (Last 24 hours) at 08/13/2024 1201 Last data filed at 08/13/2024 0900 Gross per 24 hour  Intake 870.04 ml  Output 2760 ml  Net -1889.96 ml    Physical Exam    General: NAD Neck: No JVD, no thyromegaly or thyroid  nodule.  Lungs: Clear to auscultation bilaterally with normal respiratory effort. CV: Nondisplaced PMI.  Heart regular S1/S2, no S3/S4, no murmur.  No peripheral edema.    Abdomen: Soft, nontender, no hepatosplenomegaly, no distention.  Skin: Intact without lesions or rashes.  Neurologic: Alert and oriented x 3.  Psych: Normal affect. Extremities: No clubbing or cyanosis.  HEENT: Normal.   Telemetry   type 1 2nd degree AVB alternating with 2:1 AVB, HR 50s-60s (Personally reviewed)    Medications:     Scheduled Medications:  aspirin  EC  81 mg Oral Daily   atorvastatin  80 mg Oral Daily   Chlorhexidine  Gluconate Cloth  6 each Topical Daily   clopidogrel  75 mg Oral Daily   gabapentin   300 mg Oral BID   guaiFENesin  600 mg Oral BID   heparin injection (subcutaneous)  5,000 Units Subcutaneous Q8H   insulin  aspart  0-15 Units Subcutaneous TID WC   insulin  aspart  0-5 Units Subcutaneous QHS   levothyroxine   75 mcg Oral Q0600   midodrine  10 mg Oral TID WC   pantoprazole  40 mg Oral Daily   tamsulosin   0.4 mg Oral Daily    Infusions:  cefTRIAXone (ROCEPHIN)  IV Stopped (08/12/24 1713)    PRN Medications: acetaminophen , Gerhardt's butt cream, nitroGLYCERIN, ondansetron  (ZOFRAN ) IV, mouth rinse, oxyCODONE     Patient Profile   Patient is a 89 y.o. male with a PMH of CKD, HTN, lung cancer s/p lobectomy who presented with chest pain and complete heart block.  Assessment/Plan   Heart block: Etiology of initial CHB is inferior MI.  He is now in type 1 2nd degree AVB alternating with 2:1 AVB, HR 50s-60s. - EP following, no plan for PPM at this time as rhythm appears benign. EP signed off 11/7 - Avoid nodal blockade.  - When he is walking, will assess for chronotropic competence.   Cardiogenic shock - In setting of MI - Echo as below -  Now off vasopressors - Good UOP now, looks euvolemic.  - GDMT limited by BP and renal function  Inferior MI: Troponin significantly elevated at 22K on arrival and downtrending. Suspect missed MI, no actionable STEMI on EKG, previously discussed with interventional and managed medically given his worsening CKD and age. Echo showed EF 50-55%, inferior akinesis, RV normal, PASP 57 mmHg.  Suspect RCA occlusion.   - Prn nitroglycerin for chest pain - Off heparin - Continue aspirin/plavix  - Atorvastatin 80 daily.   AKI on CKD: Suspect due to shock as well as obstruction, urine output increased after Foley placement. Baseline creatinine 1.8.  - Creatinine  3>3.7>4.1>4.2>3.99>3.79>3.5>2.85 today.  - Now on flomax . Foley removed 11/4. Minimal UOP and high PVR, suspect obstructive component.  Foley replaced 11/5, good UOP.  - Nephrology following - Suspect he will need foley at discharge and OP urology f/u.  - Keep SBP > 100 - Ongoing excellent UOP, does not need diuretic.   Hypotension - Fever and leukocytosis resolved. Empiric ceftriaxone. Cultures negative so far.  Midodrine to finish today.  - Improved. Off pressors. Now on midodrine 10 TID  PT/OT following  Waiting for telemetry transfer. [facility] to take over as primary and [facility] to see in the periphery. Does not need f/u in AHF clinic.   Length of Stay: 8  Ezra Shuck, MD  08/13/2024, 12:01 PM  Advanced Heart Failure Team Pager [phone] (M-F; 7a - 5p)  Please contact [facility] for night-coverage after hours (5p -7a ) and weekends on [email or web address]

## 2024-08-13 NOTE — Progress Notes (Signed)
 [facility] Progress Note  Name: William Wells MRN: [id number] DOB: 01-01-1934   Subjective:  He had 3.0 liters UOP over 11/7 charted.  Spoke with his daughter at bedside.  He does not take flomax  at home.  He's been on flomax  since 11/1 here.    Review of systems:  Denies shortness of breath or chest pain  Denies n/v   Intake/Output Summary (Last 24 hours) at 08/13/2024 0654 Last data filed at 08/13/2024 0600 Gross per 24 hour  Intake 390.04 ml  Output 2955 ml  Net -2564.96 ml    Vitals:  Vitals:   08/13/24 0500 08/13/24 0530 08/13/24 0600 08/13/24 0630  BP: (!) 116/46 (!) 112/52 (!) 124/51 (!) 122/48  Pulse: (!) 48 68 66 68  Resp: 19 19 18 17   Temp:   98.1 F (36.7 C)   TempSrc:   Oral   SpO2: 97% 96% 96% 95%  Weight:  79.8 kg    Height:         Physical Exam:  General adult male in bed in no acute distress HEENT normocephalic atraumatic extraocular movements intact sclera anicteric Neck supple trachea midline Lungs clear to auscultation bilaterally normal work of breathing at rest on 2 liters oxygen Heart S1S2 no rub Abdomen soft nontender nondistended Extremities no edema  Psych normal mood and affect Neuro alert and oriented x 3 provides hx and follows commands  Medications reviewed   Labs:     Latest Ref Rng & Units 08/13/2024    2:20 AM 08/12/2024    3:27 AM 08/11/2024    2:45 AM  BMP  Glucose 70 - 99 mg/dL 809  845  868   BUN 8 - 23 mg/dL 62  65  68   Creatinine 0.61 - 1.24 mg/dL 7.14  6.49  6.20   Sodium 135 - 145 mmol/L 133  135  132   Potassium 3.5 - 5.1 mmol/L 4.4  4.4  4.0   Chloride 98 - 111 mmol/L 100  100  97   CO2 22 - 32 mmol/L 22  21  22    Calcium  8.9 - 10.3 mg/dL 8.4  8.3  8.2      Assessment/Plan:   60M AKI on CKD 3B followed by Dr. Rachele as an outpatient, baseline creatinine 1.9-2.1 with likely ATN and obstructive uropathy.  Here with cardiogenic shock and secondary AV block followed by cardiology   AKI on CKD 3  from ATN and obstructive uropathy:  Slowly improving with replacement of Foley catheter and on Flomax .   Likely will need more long-term catheter as already failed Foley removal during this admission. Continues improving with supportive care  Bladder outlet obstruction with urinary retention  Foley and flomax  as above  Second-degree AV block followed by cardiology/EP  with no current plan for PPM Cardiogenic shock  followed by advanced heart failure team  On midodrine 10 mg TID  Note hx of HTN per chartin Normocytic anemia - stabilizing  CKD stage 3  Baseline Cr 1.9 - 2.1 per charting  Disposition - see that rehab was consulted and they feel he is a good candidate    William Wells William Saba, MD 08/13/2024 7:21 AM

## 2024-08-13 NOTE — Plan of Care (Signed)

## 2024-08-14 DIAGNOSIS — I2119 ST elevation (STEMI) myocardial infarction involving other coronary artery of inferior wall: Secondary | ICD-10-CM | POA: Diagnosis not present

## 2024-08-14 DIAGNOSIS — I441 Atrioventricular block, second degree: Secondary | ICD-10-CM | POA: Diagnosis not present

## 2024-08-14 LAB — RENAL FUNCTION PANEL
Albumin: 2.1 g/dL — ABNORMAL LOW (ref 3.5–5.0)
Anion gap: 13 (ref 5–15)
BUN: 57 mg/dL — ABNORMAL HIGH (ref 8–23)
CO2: 22 mmol/L (ref 22–32)
Calcium: 8.7 mg/dL — ABNORMAL LOW (ref 8.9–10.3)
Chloride: 101 mmol/L (ref 98–111)
Creatinine, Ser: 2.39 mg/dL — ABNORMAL HIGH (ref 0.61–1.24)
GFR, Estimated: 25 mL/min — ABNORMAL LOW (ref >60)
Glucose, Bld: 141 mg/dL — ABNORMAL HIGH (ref 70–99)
Phosphorus: 3.1 mg/dL (ref 2.5–4.6)
Potassium: 4.5 mmol/L (ref 3.5–5.1)
Sodium: 136 mmol/L (ref 135–145)

## 2024-08-14 LAB — GLUCOSE, CAPILLARY
Glucose-Capillary: 128 mg/dL — ABNORMAL HIGH (ref 70–99)
Glucose-Capillary: 227 mg/dL — ABNORMAL HIGH (ref 70–99)
Glucose-Capillary: 177 mg/dL — ABNORMAL HIGH (ref 70–99)
Glucose-Capillary: 171 mg/dL — ABNORMAL HIGH (ref 70–99)
Glucose-Capillary: 157 mg/dL — ABNORMAL HIGH (ref 70–99)

## 2024-08-14 MED ORDER — DARBEPOETIN ALFA 40 MCG/0.4ML IJ SOSY
40.0000 ug | PREFILLED_SYRINGE | Freq: Once | INTRAMUSCULAR | Status: AC
  Administered 2024-08-14: 40 ug via SUBCUTANEOUS
  Filled 2024-08-14 (×2): qty 0.4

## 2024-08-14 NOTE — Progress Notes (Signed)
  Progress Note  Patient Name: William Wells Date of Encounter: 08/14/2024 [facility] Cardiologist: Vishnu P Mallipeddi, MD   Interval Summary   Transferred out of 2H to [facility] yesterday. No acute overnight events. Patient reports feeling relatively well. No new or acute complaints.   Vital Signs Vitals:   08/14/24 0042 08/14/24 0400 08/14/24 0700 08/14/24 0900  BP: (!) 132/54 (!) 113/54 (!) 130/58 (!) 122/50  Pulse: (!) 57 (!) 56 (!) 45 60  Resp: 20 20 19 18   Temp: 98 F (36.7 C) 98.3 F (36.8 C) 97.7 F (36.5 C)   TempSrc: Oral Oral Oral   SpO2: 98% 96% 96% 96%  Weight:      Height:        Intake/Output Summary (Last 24 hours) at 08/14/2024 0928 Last data filed at 08/14/2024 0600 Gross per 24 hour  Intake 240 ml  Output 1275 ml  Net -1035 ml      08/13/2024   12:24 PM 08/13/2024    5:30 AM 08/12/2024    5:00 AM  Last 3 Weights  Weight (lbs) 178 lb 2.1 oz 175 lb 14.8 oz 177 lb 4.8 oz  Weight (kg) 80.8 kg 79.8 kg 80.423 kg      Telemetry/ECG  SR with 2:1 AVB and Mobitz I - Personally Reviewed  Physical Exam  General: Well developed, in no acute distress.  Neck: No JVD.  Cardiac: Normal rate, regular rhythm.  Resp: Normal work of breathing.  Ext: No edema.  Neuro: No gross focal deficits.  Psych: Normal affect.   Assessment & Plan     Inferior MI: Troponin significantly elevated at 22K on arrival and downtrending. Suspect missed MI, no actionable STEMI on EKG, previously discussed with interventional and managed medically given his worsening CKD and age. Echo showed EF 50-55%, inferior akinesis, RV normal, PASP 57 mmHg.  Suspect RCA occlusion.   - Prn nitroglycerin for chest pain. - Off heparin. - Continue aspirin/plavix  - Atorvastatin 80 daily.   #Heart block: In setting of inferior MI. Initially CHB then progression to 2:1 and now predominantly AVWB.   - No plan for PPM at this time given recovery. Will need to assess for chronotropic competence now  that out of ICU and able to participate more freely with PT/OT.  - Avoid nodal blockade.    AKI on CKD stage 3: Suspect due to shock as well as obstruction, urine output increased after Foley placement. Baseline creatinine 1.8.  - Creatinine 3>3.7>4.1>4.2>3.99>3.79>3.5>2.85>2.39 today.  - Now on flomax . Foley removed 11/4. Minimal UOP and high PVR, suspect obstructive component.  Foley replaced 11/5, good UOP.  - Nephrology following - Suspect he will need foley at discharge and OP urology f/u.    Hypotension: Cardiogenic shock in setting of inferior MI. Also had fever and leukocytosis.  - Empiric ceftriaxone completed. Cultures negative so far.   - Continue midodrine 10mg  TID.    Signed, Fonda Kitty, MD

## 2024-08-14 NOTE — Progress Notes (Addendum)
 [facility] Progress Note  Name: William Wells MRN: [id number] DOB: 1933-12-03   Subjective:  He had 1.4 liters UOP over 11/7 charted.   He was transferred to [facility] east in the interim.  His two daughters are at bedside.  Doesn't wear oxygen at home and has been on 2 liters here.   Review of systems:    Denies shortness of breath; some chest pain last night - gone now Denies n/v   Intake/Output Summary (Last 24 hours) at 08/14/2024 1046 Last data filed at 08/14/2024 0949 Gross per 24 hour  Intake 600 ml  Output 1275 ml  Net -675 ml    Vitals:  Vitals:   08/14/24 0042 08/14/24 0400 08/14/24 0700 08/14/24 0900  BP: (!) 132/54 (!) 113/54 (!) 130/58 (!) 122/50  Pulse: (!) 57 (!) 56 (!) 45 60  Resp: 20 20 19 18   Temp: 98 F (36.7 C) 98.3 F (36.8 C) 97.7 F (36.5 C)   TempSrc: Oral Oral Oral   SpO2: 98% 96% 96% 96%  Weight:      Height:         Physical Exam:    General adult male in bed in no acute distress HEENT normocephalic atraumatic extraocular movements intact sclera anicteric Neck supple trachea midline Lungs clear to auscultation bilaterally normal work of breathing at rest on 2 liters oxygen Heart S1S2 no rub Abdomen soft nontender nondistended Extremities no edema  Psych normal mood and affect Neuro alert and oriented x 3 provides hx and follows commands  Medications reviewed   Labs:     Latest Ref Rng & Units 08/14/2024    2:26 AM 08/13/2024    2:20 AM 08/12/2024    3:27 AM  BMP  Glucose 70 - 99 mg/dL 858  809  845   BUN 8 - 23 mg/dL 57  62  65   Creatinine 0.61 - 1.24 mg/dL 7.60  7.14  6.49   Sodium 135 - 145 mmol/L 136  133  135   Potassium 3.5 - 5.1 mmol/L 4.5  4.4  4.4   Chloride 98 - 111 mmol/L 101  100  100   CO2 22 - 32 mmol/L 22  22  21    Calcium  8.9 - 10.3 mg/dL 8.7  8.4  8.3      Assessment/Plan:   86M AKI on CKD 3B followed by Dr. Rachele as an outpatient, baseline creatinine 1.9-2.1 with likely ATN and obstructive  uropathy.  Here with cardiogenic shock and secondary AV block followed by cardiology   AKI on CKD 3 from ATN and obstructive uropathy:  Slowly improving with replacement of Foley catheter and on Flomax .   Likely will need more long-term catheter as already failed Foley removal during this admission. Continues improving with supportive care  Bladder outlet obstruction with urinary retention  Foley and flomax  as above  He will need urology follow-up with [facility] outpatient  Second-degree AV block followed by cardiology/EP  with no current plan for PPM Cardiogenic shock  followed by advanced heart failure team  On midodrine 10 mg TID  Note hx of HTN per charting Normocytic anemia - stabilizing.  Will give aranesp 40 mcg once today to optimize  CKD stage 3  Baseline Cr 1.9 - 2.1 per charting  Disposition - see that rehab was consulted and they feel he is a good candidate.  Nephrology will sign off.  Please do not hesitate to contact us  with any questions regarding the patient.  He  will need follow-up with his nephrologist Dr. Rachele after discharge  William JAYSON Saba, MD 08/14/2024 11:06 AM

## 2024-08-14 NOTE — PMR Pre-admission (Shared)
 PMR Admission Coordinator Pre-Admission Assessment  Patient: William Wells is an 89 y.o., male MRN: [id number] DOB: June 30, 1934 Height: 5' 8 (172.7 cm) Weight: 80.8 kg  Insurance Information HMO: yes    PPO:      PCP:      IPA:      80/20:      OTHER:  PRIMARY:  [employer] Medicare     Policy#: [id number] Subscriber: Pt CM Name: ***      Phone#: [phone]     Fax#: *** Pre-Cert#: [id number]            Employer:  Benefits:  Phone #:      Name:  Eustacio. Date: 10/07/23     Deduct: 0      Out of Pocket Max: $3,900 [phone] met)      Life Max: n/a  CIR: $395/ day for days 1-6      SNF: $0 days 1-20 Outpatient: $15 per visit  Home Health: $0      DME: 20% Coinsurance Providers: in network SECONDARY:       Policy#:      Phone#:   Artist:       Phone#:   The Data Processing Manager" for patients in Inpatient Rehabilitation Facilities with attached "Privacy Act Statement-Health Care Records" was provided and verbally reviewed with: Patient  Emergency Contact Information Contact Information     Name Relation Home Work [place] Daughter   [phone]      Other Contacts     Name Relation Home Work Mobile   [place] Daughter   [phone]       Current Medical History  Patient Admitting Diagnosis: Heart Failure History of Present Illness: William Wells is a 89 year old M known to have lung cancer S/P robotic assisted left upper lobectomy in 2021, HTN, DM 2, CKD stage IIIb who presented to the [facility] ED 08/05/24 with chest pressure.EKG showed severe sinus bradycardia, subtle ST elevations in the inferior leads and second-degree AV block, 2:1. Hs troponin significantly elevated, 22,494>>16,320. EDP performed bedside echocardiogram that showed normal LVEF. He received 500 mL IVF bolus in the ER. cardiology was consulted, patient loaded with aspirin and Plavix and a high intensity statin, started on a heparin drip.  Noted need to avoid  nitroglycerin and inferior STEMI.  Due to serum creatinine 3.47, interventional cardiology Dr. Wendel felt risks of dialysis with PCI were too high, and patient was treated medically.  He was started on norepinephrine and dopamine drips and admitted to the ICU.  He has been gradually weaned off of pressors and is now stable on midodrine 10 mg 3 times daily.  Hospitalization was complicated by complete heart block with EP following, no plan for PPM; cardiogenic shock managed with IV Lasix and pressors; inferior MI with RCA occlusion now managed with as needed nitroglycerin and off of heparin; AKI on CKD3B with nephrology following determine likely due to ATN and obstructive uropathy; urinary retention with failed DC Foley trial 08/09/24, likely need for long-term catheter per nephrology; hypotension; diabetes with hyperglycemia; new oxygen requirement; and fever of unknown origin managed with empiric ceftriaxone with improving lactic leukocytosis and negative cultures.  He was seen by PT/OT/SLP and they recommend CIR to assist return to PLOF.     Patient's medical record from [facility] has been reviewed by the rehabilitation admission coordinator and physician.  Past Medical History  Past Medical History:  Diagnosis Date   Acid reflux  disease    Arthritis    Complication of anesthesia    appendix surgery (89 year old) - passed out once patient got back to floor unit   Diabetes mellitus    Dyspnea    Family history of adverse reaction to anesthesia    daughter states that she has a difficult time waking up   Hiatal hernia    High cholesterol    Hypertension    Hypothyroidism    Lung cancer [facility])    s/p XI robotic assisted thoracoscopy for LU lobectomy 06/22/20   Monoclonal gammopathy of unknown significance (MGUS) 03/28/2020   Normocytic anemia 03/28/2020   Renal disorder    renal insufficiency   Type 2 diabetes mellitus (HCC)     Has the patient had major surgery during  100 days prior to admission? Yes  Family History   family history includes Cancer in his brother, father, and sister; Diabetes in his brother, brother, brother, and mother; Parkinson's disease in his brother.  Current Medications  Current Facility-Administered Medications:    acetaminophen  (TYLENOL ) tablet 650 mg, 650 mg, Oral, Q4H PRN, Dunn, Dayna N, PA-C, 650 mg at 08/12/24 0336   aspirin EC tablet 81 mg, 81 mg, Oral, Daily, Mallipeddi, Vishnu P, MD, 81 mg at 08/14/24 0818   atorvastatin (LIPITOR) tablet 80 mg, 80 mg, Oral, Daily, Mallipeddi, Vishnu P, MD, 80 mg at 08/14/24 0818   Chlorhexidine  Gluconate Cloth 2 % PADS 6 each, 6 each, Topical, Daily, Mallipeddi, Vishnu P, MD, 6 each at 08/14/24 0953   clopidogrel (PLAVIX) tablet 75 mg, 75 mg, Oral, Daily, Stoner, Benjamin J, MD, 75 mg at 08/14/24 0818   Darbepoetin Alfa (ARANESP) injection 40 mcg, 40 mcg, Subcutaneous, Once, Jerrye Katheryn BROCKS, MD   gabapentin  (NEURONTIN ) capsule 300 mg, 300 mg, Oral, BID, Stoner, Benjamin J, MD, 300 mg at 08/14/24 0818   Gerhardt's butt cream, , Topical, Daily PRN, Zenaida Morene PARAS, MD, 1 Application at 08/09/24 2141   guaiFENesin (MUCINEX) 12 hr tablet 600 mg, 600 mg, Oral, BID, Stoner, Benjamin J, MD, 600 mg at 08/14/24 0818   heparin injection 5,000 Units, 5,000 Units, Subcutaneous, Q8H, Zenaida Morene PARAS, MD, 5,000 Units at 08/14/24 9361   insulin  aspart (novoLOG ) injection 0-15 Units, 0-15 Units, Subcutaneous, TID WC, Zenaida Morene PARAS, MD, 5 Units at 08/13/24 1818   insulin  aspart (novoLOG ) injection 0-5 Units, 0-5 Units, Subcutaneous, QHS, Zenaida Morene PARAS, MD, 2 Units at 08/13/24 2145   insulin  glargine-yfgn [facility]) injection 7 Units, 7 Units, Subcutaneous, Daily, Rolan Ezra RAMAN, MD, 7 Units at 08/14/24 0818   levothyroxine  (SYNTHROID ) tablet 75 mcg, 75 mcg, Oral, Q0600, Zenaida Morene PARAS, MD, 75 mcg at 08/14/24 0638   midodrine (PROAMATINE) tablet 10 mg, 10 mg, Oral, TID WC, Stoner, Benjamin  J, MD, 10 mg at 08/14/24 0818   ondansetron  (ZOFRAN ) injection 4 mg, 4 mg, Intravenous, Q6H PRN, Dunn, Dayna N, PA-C, 4 mg at 08/08/24 1749   Oral care mouth rinse, 15 mL, Mouth Rinse, PRN, Mallipeddi, Vishnu P, MD   oxyCODONE  (Oxy IR/ROXICODONE ) immediate release tablet 5 mg, 5 mg, Oral, Q8H PRN, Delgado, Vincent A, MD, 5 mg at 08/10/24 1121   pantoprazole (PROTONIX) EC tablet 40 mg, 40 mg, Oral, Daily, Dunn, Dayna N, PA-C, 40 mg at 08/14/24 0818   polyethylene glycol (MIRALAX / GLYCOLAX) packet 17 g, 17 g, Oral, Daily PRN, Salah, Husam M, MD   tamsulosin  (FLOMAX ) capsule 0.4 mg, 0.4 mg, Oral, Daily, Zenaida Morene PARAS, MD, 0.4  mg at 08/14/24 0818  Patients Current Diet:  Diet Order             Diet Heart Room service appropriate? Yes; Fluid consistency: Thin  Diet effective now                   Precautions / Restrictions Precautions Precautions: Fall Precaution/Restrictions Comments: watch O2/HR Restrictions Weight Bearing Restrictions Per Provider Order: No   Has the patient had 2 or more falls or a fall with injury in the past year? No  Prior Activity Level  Pt. Active in the community PTA  Prior Functional Level Self Care: Did the patient need help bathing, dressing, using the toilet or eating? Independent  Indoor Mobility: Did the patient need assistance with walking from room to room (with or without device)? Independent  Stairs: Did the patient need assistance with internal or external stairs (with or without device)? Independent  Functional Cognition: Did the patient need help planning regular tasks such as shopping or remembering to take medications? Independent  Patient Information Are you of Hispanic, Latino/a,or Spanish origin?: A. No, not of Hispanic, Latino/a, or Spanish origin What is your race?: A. White Do you need or want an interpreter to communicate with a doctor or health care staff?: 0. No  Patient's Response To:  Health Literacy and  Transportation Is the patient able to respond to health literacy and transportation needs?: Yes Health Literacy - How often do you need to have someone help you when you read instructions, pamphlets, or other written material from your doctor or pharmacy?: Sometimes In the past 12 months, has lack of transportation kept you from medical appointments or from getting medications?: No In the past 12 months, has lack of transportation kept you from meetings, work, or from getting things needed for daily living?: No  Journalist, Newspaper / Equipment Home Equipment: Information systems manager - built in, [place] - single point, Rollator (4 wheels)  Prior Device Use: Indicate devices/aids used by the patient prior to current illness, exacerbation or injury? None of the above  Current Functional Level Cognition  Orientation Level: Oriented to person, Oriented to place, Oriented to time, Disoriented to situation    Extremity Assessment (includes Sensation/Coordination)  Upper Extremity Assessment: Generalized weakness, Right hand dominant  Lower Extremity Assessment: Defer to PT evaluation    ADLs  Overall ADL's : Needs assistance/impaired Eating/Feeding: Set up, Sitting Grooming: Minimal assistance, Sitting Upper Body Bathing: Moderate assistance, Sitting Lower Body Bathing: Moderate assistance, Sitting/lateral leans Upper Body Dressing : Minimal assistance, Sitting Lower Body Dressing: Moderate assistance, Sitting/lateral leans Toilet Transfer: Minimal assistance, Stand-pivot, +2 for safety/equipment, BSC/3in1, Rolling walker (2 wheels) Toilet Transfer Details (indicate cue type and reason): cues for hand placement and step sequence, some unsteadiness noted Toileting- Clothing Manipulation and Hygiene: Moderate assistance Functional mobility during ADLs: Minimal assistance, +2 for safety/equipment, Rolling walker (2 wheels), Cueing for sequencing General ADL Comments: able to walk further than prior PT  session yesterday (around 30 ft today), LE still with shakiness so close chair follow needed.    Mobility  Overal bed mobility: Needs Assistance Bed Mobility: Supine to Sit Rolling: Min assist, Used rails Sidelying to sit: Min assist, Used rails, HOB elevated Supine to sit: Contact guard, HOB elevated, Used rails General bed mobility comments: CGA  to ensure truncal balance. good effort reaching over to bedrail and bring LE to EOB    Transfers  Overall transfer level: Needs assistance Equipment used: Rolling walker (2 wheels) Transfers:  Sit to/from Stand, Bed to chair/wheelchair/BSC Sit to Stand: Min assist Bed to/from chair/wheelchair/BSC transfer type:: Step pivot Step pivot transfers: Min assist, +2 safety/equipment General transfer comment: Min A to stand at bedside, +2 for safety to pivot to [facility] using RW. Consistent cues needed for hand placement during transitional movements.    Ambulation / Gait / Stairs / Wheelchair Mobility  Ambulation/Gait Ambulation/Gait assistance: Mod assist Gait Distance (Feet): 18 Feet Assistive device: Rolling walker (2 wheels) Gait Pattern/deviations: Step-through pattern, Decreased stride length, Knees buckling, Shuffle, Leaning posteriorly, Trunk flexed, Narrow base of support General Gait Details: Educated on safe AD use with RW, needs assist to lean forward intermittently due to posterior instability. Moderate dyspnea with SpO2 90% and greater on 2L supplemental O2. HR into upper 60s at peak. Pts legs began to buckle as he fatigued rapidly. Required to sit in chair before completing targeted distance. Required max cues for RW use, including assist for navigating forward, turning, and mod assist for balance. Gait velocity: dec Gait velocity interpretation: <1.31 ft/sec, indicative of household ambulator Pre-gait activities: static march    Posture / Balance Balance Overall balance assessment: Needs assistance Sitting-balance support: Feet  supported, No upper extremity supported Sitting balance-Leahy Scale: Good Standing balance support: During functional activity, Reliant on assistive device for balance, Bilateral upper extremity supported Standing balance-Leahy Scale: Poor Standing balance comment: Posterior LOB    Special considerations/life events  Skin ***   Previous Home Environment (from acute therapy documentation) Living Arrangements: Children (daughter) Available Help at Discharge: Family, Available PRN/intermittently Type of Home: House Home Layout: One level Home Access: Ramped entrance Bathroom Shower/Tub: Health Visitor: Standard Bathroom Accessibility: Yes How Accessible: Accessible via walker Home Care Services: No  Discharge Living Setting Plans for Discharge Living Setting: House Type of Home at Discharge: House Discharge Home Layout: One level Discharge Home Access: Ramped entrance Discharge Bathroom Shower/Tub: Walk-in shower Discharge Bathroom Toilet: Standard Discharge Bathroom Accessibility: Yes How Accessible: Accessible via walker Does the patient have any problems obtaining your medications?: No  Social/Family/Support Systems Patient Roles: Other (Comment) Contact Information: Merlynn Sluder (daughter) Anticipated Caregiver: [phone] Anticipated Caregiver's Contact Information: Lives with daughter who works, other daughter can come stay from out of state for a while. Caregiver Availability: 24/7 Discharge Plan Discussed with Primary Caregiver: Yes Is Caregiver In Agreement with Plan?: Yes  Goals Patient/Family Goal for Rehab: PT/OT/SLP Supervision to min A Expected length of stay: 12-14 days Pt/Family Agrees to Admission and willing to participate: Yes Program Orientation Provided & Reviewed with Pt/Caregiver Including Roles  & Responsibilities: Yes  Decrease burden of Care through IP rehab admission: Not anticipated  Possible need for SNF placement upon  discharge: Not anticipated  Patient Condition: I have reviewed medical records from [facility], spoken with CM, and patient and daughter. I met with patient at the bedside for inpatient rehabilitation assessment.  Patient will benefit from ongoing PT, OT, and SLP, can actively participate in 3 hours of therapy a day 5 days of the week, and can make measurable gains during the admission.  Patient will also benefit from the coordinated team approach during an Inpatient Acute Rehabilitation admission.  The patient will receive intensive therapy as well as Rehabilitation physician, nursing, social worker, and care management interventions.  Due to safety, skin/wound care, disease management, medication administration, pain management, and patient education the patient requires 24 hour a day rehabilitation nursing.  The patient is currently *** with mobility and basic ADLs.  Discharge  setting and therapy post discharge at home with outpatient is anticipated.  Patient has agreed to participate in the Acute Inpatient Rehabilitation Program and will admit {Time; today/tomorrow:10263}.  Preadmission Screen Completed By:  Leita KATHEE Kleine, 08/14/2024 12:41 PM ______________________________________________________________________   Discussed status with Dr. PIERRETTE on *** at *** and received approval for admission today.  Admission Coordinator:  Leita KATHEE Kleine, CCC-SLP, time PIERRETTEPattricia ***   Assessment/Plan: Diagnosis: *** Does the need for close, 24 hr/day Medical supervision in concert with the patient's rehab needs make it unreasonable for this patient to be served in a less intensive setting? {yes_no_potentially:3041433} Co-Morbidities requiring supervision/potential complications: *** Due to {due un:6958565}, does the patient require 24 hr/day rehab nursing? {yes_no_potentially:3041433} Does the patient require coordinated care of a physician, rehab nurse, PT, OT, and SLP to address physical and  functional deficits in the context of the above medical diagnosis(es)? {yes_no_potentially:3041433} Addressing deficits in the following areas: {deficits:3041436} Can the patient actively participate in an intensive therapy program of at least 3 hrs of therapy 5 days a week? {yes_no_potentially:3041433} The potential for patient to make measurable gains while on inpatient rehab is {potential:3041437} Anticipated functional outcomes upon discharge from inpatient rehab: {functional outcomes:304600100} PT, {functional outcomes:304600100} OT, {functional outcomes:304600100} SLP Estimated rehab length of stay to reach the above functional goals is: *** Anticipated discharge destination: {anticipated dc setting:21604} 10. Overall Rehab/Functional Prognosis: {potential:3041437}   MD Signature: ***

## 2024-08-14 NOTE — Plan of Care (Signed)

## 2024-08-14 NOTE — Progress Notes (Signed)
 Patient transferred out of 2H, per previous plan, I have paged [facility] service to take over as the primary team. Gen card to remain as consulting service. Awaiting call back from hospitalist service.

## 2024-08-14 NOTE — Progress Notes (Signed)
 Physical Therapy Treatment Patient Details Name: William Wells MRN: [id number] DOB: 02/10/1934 Today's Date: 08/14/2024   History of Present Illness Pt is an 89 y/o M presenting to ED on 10/31 wtih chest pressure, generalized weakness, fall, CT negative, EKG with ST elevation, evidence of AV block, suspect missing MI. Plan for possible PPM. Also with AKI. PMH includes lung CA s/p robotic assisted upper lobectomy, HTN, DM2, CKD IIIB    PT Comments  Pt is limited by reports of fatigue today. Pt is clammy and appears quite shaky in standing and with short bout of ambulation. Pt remains at a high risk for falls due to poor endurance and generalized weakness. Patient will benefit from intensive inpatient follow-up therapy, >3 hours/day.    If plan is discharge home, recommend the following: A lot of help with walking and/or transfers;A lot of help with bathing/dressing/bathroom;Assistance with cooking/housework;Assist for transportation;Help with stairs or ramp for entrance   Can travel by private vehicle        Equipment Recommendations  None recommended by PT    Recommendations for Other Services       Precautions / Restrictions Precautions Precautions: Fall Recall of Precautions/Restrictions: Intact Precaution/Restrictions Comments: watch O2/HR Restrictions Weight Bearing Restrictions Per Provider Order: No     Mobility  Bed Mobility Overal bed mobility: Needs Assistance Bed Mobility: Rolling, Sidelying to Sit Rolling: Contact guard assist Sidelying to sit: Contact guard assist, HOB elevated, Used rails            Transfers Overall transfer level: Needs assistance Equipment used: Rolling walker (2 wheels) Transfers: Sit to/from Stand, Bed to chair/wheelchair/BSC Sit to Stand: Min assist   Step pivot transfers: Min assist       General transfer comment: pt performs SPT from bed to [facility] and then to recliner    Ambulation/Gait Ambulation/Gait assistance: Min  assist Gait Distance (Feet): 6 Feet Assistive device: Rolling walker (2 wheels) Gait Pattern/deviations: Step-to pattern, Trunk flexed Gait velocity: reduced Gait velocity interpretation: <1.31 ft/sec, indicative of household ambulator   General Gait Details: pt with slowed step-to gait, PT notes shaky gait with BUE tremors at times.   Stairs             Wheelchair Mobility     Tilt Bed    Modified Rankin (Stroke Patients Only)       Balance Overall balance assessment: Needs assistance Sitting-balance support: No upper extremity supported, Feet supported Sitting balance-Leahy Scale: Fair     Standing balance support: Bilateral upper extremity supported, Reliant on assistive device for balance Standing balance-Leahy Scale: Poor                              Communication Communication Communication: No apparent difficulties  Cognition Arousal: Alert Behavior During Therapy: WFL for tasks assessed/performed   PT - Cognitive impairments: No apparent impairments                         Following commands: Intact      Cueing Cueing Techniques: Verbal cues  Exercises      General Comments General comments (skin integrity, edema, etc.): HR in 40-50s during session, pt with a large bowel movement during PT session. PT notes the pt to be clammy, with moist gown upon PT arrival. Pt does feel warm to the touch. PT reached out to RN/NT via secure chat to make them aware  Pertinent Vitals/Pain Pain Assessment Pain Assessment: Faces Faces Pain Scale: Hurts little more Pain Location: buttocks Pain Descriptors / Indicators: Sore Pain Intervention(s): Monitored during session    Home Living                          Prior Function            PT Goals (current goals can now be found in the care plan section) Acute Rehab PT Goals Patient Stated Goal: to go home Progress towards PT goals: Not progressing toward goals - comment  (limited by fatigue)    Frequency    Min 2X/week      PT Plan      Co-evaluation              AM-PAC PT 6 Clicks Mobility   Outcome Measure  Help needed turning from your back to your side while in a flat bed without using bedrails?: A Little Help needed moving from lying on your back to sitting on the side of a flat bed without using bedrails?: A Little Help needed moving to and from a bed to a chair (including a wheelchair)?: A Little Help needed standing up from a chair using your arms (e.g., wheelchair or bedside chair)?: A Little Help needed to walk in hospital room?: Total Help needed climbing 3-5 steps with a railing? : Total 6 Click Score: 14    End of Session Equipment Utilized During Treatment: Gait belt;Oxygen Activity Tolerance: Patient limited by fatigue Patient left: in chair;with call bell/phone within reach;with chair alarm set Nurse Communication: Mobility status PT Visit Diagnosis: Muscle weakness (generalized) (M62.81);Unsteadiness on feet (R26.81);Other abnormalities of gait and mobility (R26.89);Difficulty in walking, not elsewhere classified (R26.2)     Time: 8740-8670 PT Time Calculation (min) (ACUTE ONLY): 30 min  Charges:    $Therapeutic Activity: 23-37 mins PT General Charges $$ ACUTE PT VISIT: 1 Visit                     William Wells, PT, DPT Acute Rehabilitation Office [phone]    William Wells 08/14/2024, 3:42 PM

## 2024-08-14 NOTE — Consult Note (Addendum)
 Initial Consultation Note   Patient: William Wells FMW:[id number] DOB: October 18, 1933 PCP: Shona Norleen PEDLAR, MD DOA: 08/05/2024 DOS: the patient was seen and examined on 08/14/2024 Primary service: Zenaida Morene PARAS, MD  Referring physician: Scot Ford Reason for consult: AKI  Assessment/Plan: Assessment and Plan: 89M h/o lung cancer s/p LUL lobectomy in 06/2020, HTN, HLD, DM2, and GERD who p/w cardiogenic shock iso missed inferior MI c/b complete heart block and AKI.  AKI -Renal consulted; will defer to them for mgt of AKI  Cardiogenic shock Inferior MI Complete heart block -Cards following; apprec eval/recs   Physical deconditioning Accepted to IP rehab per Dr. Emeline not on 11/7 -PT/OT consulted; apprec eval/recs   TRH will continue to follow the patient.  HPI: William Wells is a 89 y.o. male with past medical history of lung cancer s/p LUL lobectomy in 06/2020, HTN, HLD, DM2, and GERD who p/w cardiogenic shock iso missed inferior MI c/b complete heart block and AKI.  The patient presented to the hospital a few days after experiencing a heart attack. Upon arrival, the patient reported difficulty breathing. Since admission, the patient's heart has been treated with medications and diuretics to remove excess fluid.    Review of Systems: As mentioned in the history of present illness. All other systems reviewed and are negative. Past Medical History:  Diagnosis Date   Acid reflux disease    Arthritis    Complication of anesthesia    appendix surgery (89 year old) - passed out once patient got back to floor unit   Diabetes mellitus    Dyspnea    Family history of adverse reaction to anesthesia    daughter states that she has a difficult time waking up   Hiatal hernia    High cholesterol    Hypertension    Hypothyroidism    Lung cancer (HCC)    s/p XI robotic assisted thoracoscopy for LU lobectomy 06/22/20   Monoclonal gammopathy of unknown significance (MGUS) 03/28/2020    Normocytic anemia 03/28/2020   Renal disorder    renal insufficiency   Type 2 diabetes mellitus (HCC)    Past Surgical History:  Procedure Laterality Date   APPENDECTOMY     ARTERY BIOPSY Right 11/23/2023   Procedure: RIGHT BIOPSY TEMPORAL ARTERY;  Surgeon: Gretta Lonni PARAS, MD;  Location: [facility] OR;  Service: Vascular;  Laterality: Right;   BRONCHIAL BIOPSY  05/29/2020   Procedure: BRONCHIAL BIOPSIES;  Surgeon: Shelah Lamar RAMAN, MD;  Location: [facility] ENDOSCOPY;  Service: Pulmonary;;   BRONCHIAL BRUSHINGS  05/29/2020   Procedure: BRONCHIAL BRUSHINGS;  Surgeon: Shelah Lamar RAMAN, MD;  Location: [facility] ENDOSCOPY;  Service: Pulmonary;;   BRONCHIAL NEEDLE ASPIRATION BIOPSY  05/29/2020   Procedure: BRONCHIAL NEEDLE ASPIRATION BIOPSIES;  Surgeon: Shelah Lamar RAMAN, MD;  Location: [facility] ENDOSCOPY;  Service: Pulmonary;;   BRONCHIAL WASHINGS  05/29/2020   Procedure: BRONCHIAL WASHINGS;  Surgeon: Shelah Lamar RAMAN, MD;  Location: [facility] ENDOSCOPY;  Service: Pulmonary;;   CATARACT EXTRACTION W/PHACO Left 01/23/2023   Procedure: CATARACT EXTRACTION PHACO AND INTRAOCULAR LENS PLACEMENT (IOC);  Surgeon: Harrie Lynwood, MD;  Location: [facility] ORS;  Service: Ophthalmology;  Laterality: Left;  CDE: 13.69   CATARACT EXTRACTION W/PHACO Right 02/20/2023   Procedure: CATARACT EXTRACTION PHACO AND INTRAOCULAR LENS PLACEMENT (IOC);  Surgeon: Harrie Lynwood, MD;  Location: [facility] ORS;  Service: Ophthalmology;  Laterality: Right;  CDE: 12.88   CERVICAL SPINE SURGERY     EYE SURGERY     strabismus   INTERCOSTAL NERVE BLOCK  Left 06/22/2020   Procedure: INTERCOSTAL NERVE BLOCK;  Surgeon: Kerrin Elspeth BROCKS, MD;  Location: [facility] OR;  Service: Thoracic;  Laterality: Left;   NODE DISSECTION Left 06/22/2020   Procedure: NODE DISSECTION;  Surgeon: Kerrin Elspeth BROCKS, MD;  Location: [facility] OR;  Service: Thoracic;  Laterality: Left;   TONSILLECTOMY     VIDEO BRONCHOSCOPY WITH ENDOBRONCHIAL NAVIGATION N/A 05/29/2020   Procedure: VIDEO BRONCHOSCOPY WITH  ENDOBRONCHIAL NAVIGATION;  Surgeon: Shelah Lamar RAMAN, MD;  Location: [facility] ENDOSCOPY;  Service: Pulmonary;  Laterality: N/A;   Social History:  reports that he quit smoking about 25 years ago. His smoking use included cigarettes. He started smoking about 72 years ago. He has a 23.5 pack-year smoking history. He has never used smokeless tobacco. He reports that he does not drink alcohol and does not use drugs.  No Known Allergies  Family History  Problem Relation Age of Onset   Diabetes Mother    Cancer Father    Cancer Sister    Diabetes Brother    Cancer Brother    Diabetes Brother    Parkinson's disease Brother    Diabetes Brother     Prior to Admission medications   Medication Sig Start Date End Date Taking? Authorizing Provider  acetaminophen  (TYLENOL ) 500 MG tablet Take 2 tablets (1,000 mg total) by mouth every 6 (six) hours. Patient taking differently: Take 500 mg by mouth daily as needed for headache, moderate pain (pain score 4-6) or mild pain (pain score 1-3). 06/26/20  Yes Conte, Tessa N, PA-C  albuterol  (VENTOLIN  HFA) 108 (90 Base) MCG/ACT inhaler INAHLE 2 PUFFS BY MOUTH 4 TIMES A DAY AS NEEDED FOR COUGH/WHEEZING   Yes [provider]  diclofenac Sodium (VOLTAREN) 1 % GEL Apply 1 Application topically daily as needed (pain).   Yes [provider]  docusate sodium  (COLACE) 100 MG capsule Take 1 capsule (100 mg total) by mouth 2 (two) times daily as needed for mild constipation. Patient taking differently: Take 100 mg by mouth 2 (two) times a week. 06/26/20  Yes Lucien Orren SAILOR, PA-C  ferrous sulfate 325 (65 FE) MG tablet Take 325 mg by mouth every Monday, Wednesday, and Friday.   Yes [provider]  gabapentin  (NEURONTIN ) 300 MG capsule Take 300 mg by mouth 3 (three) times daily. 12/13/21  Yes [provider]  glipiZIDE  (GLUCOTROL  XL) 2.5 MG 24 hr tablet Take 1.25 mg by mouth daily as needed (blood sugar above 160). 03/20/20  Yes [provider]  guaiFENesin (MUCINEX) 600 MG 12 hr tablet Take 600 mg by mouth 2 (two) times daily.   Yes [provider]  HYDROcodone -acetaminophen  (NORCO) 7.5-325 MG tablet Take 1 tablet by mouth every 6 (six) hours as needed for moderate pain (pain score 4-6). Patient taking differently: Take 1 tablet by mouth daily as needed for moderate pain (pain score 4-6) or severe pain (pain score 7-10). 06/08/24  Yes Jerri Kay HERO, MD  levothyroxine  (SYNTHROID ) 75 MCG tablet Take 75 mcg by mouth at bedtime. 05/23/21  Yes [provider]  LOKELMA 5 g packet Take 5 g by mouth every Monday, Wednesday, and Friday. 10/08/20  Yes [provider]  Multiple Vitamin (MULTIVITAMIN ADULT PO) Take 1 tablet by mouth at bedtime.   Yes [provider]  Multiple Vitamins-Minerals (PRESERVISION AREDS PO) Take 1 tablet by mouth in the morning and at bedtime.   Yes [provider]  pantoprazole (PROTONIX) 20 MG tablet Take 20 mg by mouth daily. 05/27/24  Yes [provider]  rosuvastatin  (CRESTOR ) 40 MG tablet Take 40 mg by mouth daily.    Yes [provider]  telmisartan (MICARDIS) 20 MG tablet Take 10 mg by mouth daily. 04/08/23  Yes [provider]  VITAMIN D  PO Take 2 capsules by mouth at bedtime.   Yes [provider]  VITAMIN E PO Take 1 capsule by mouth at bedtime.   Yes [provider]  mupirocin ointment (BACTROBAN) 2 % Apply 1 Application topically 3 (three) times daily. Patient not taking: Reported on 08/08/2024 06/30/24   [provider]  omeprazole (PRILOSEC) 20 MG capsule Take 20 mg by mouth daily. Patient not taking: Reported on 08/08/2024    [provider]    Physical Exam: Vitals:   08/14/24 0400 08/14/24 0700 08/14/24 0900 08/14/24 1300  BP: (!) 113/54 (!) 130/58 (!) 122/50 (!) 118/53  Pulse: (!) 56 (!) 45 60 (!) 140  Resp: 20 19 18 17   Temp: 98.3 F (36.8 C) 97.7 F (36.5 C)  97.7 F (36.5 C)  TempSrc: Oral  Oral  Oral  SpO2: 96% 96% 96% 97%  Weight:      Height:       General: Alert, oriented x3, resting comfortably in no acute distress Respiratory: Lungs clear to auscultation bilaterally with normal respiratory effort; no w/r/r Cardiovascular: Regular rate and rhythm w/o m/r/g   Data Reviewed:   Lab Results  Component Value Date   WBC 9.4 08/13/2024   HGB 9.3 (L) 08/13/2024   HCT 27.6 (L) 08/13/2024   MCV 89.3 08/13/2024   PLT 230 08/13/2024   Lab Results  Component Value Date   GLUCOSE 141 (H) 08/14/2024   CALCIUM  8.7 (L) 08/14/2024   NA 136 08/14/2024   K 4.5 08/14/2024   CO2 22 08/14/2024   CL 101 08/14/2024   BUN 57 (H) 08/14/2024   CREATININE 2.39 (H) 08/14/2024   Lab Results  Component Value Date   ALT 221 (H) 08/11/2024   AST 131 (H) 08/11/2024   ALKPHOS 111 08/11/2024   BILITOT 0.6 08/11/2024   Lab Results  Component Value Date   INR 0.9 06/20/2020   INR 1.0 05/14/2020   Radiology: No results found.   Family Communication: Daughter Primary team communication: N/A   Thank you very much for involving us  in the care of your patient.   ------- I spent 50 minutes reviewing previous notes, at the bedside counseling/discussing the treatment plan, and performing clinical documentation.  Author: Marsha Ada, MD 08/14/2024 2:17 PM  For on call review [email or web address].

## 2024-08-15 DIAGNOSIS — I2119 ST elevation (STEMI) myocardial infarction involving other coronary artery of inferior wall: Secondary | ICD-10-CM | POA: Diagnosis not present

## 2024-08-15 DIAGNOSIS — N179 Acute kidney failure, unspecified: Secondary | ICD-10-CM

## 2024-08-15 DIAGNOSIS — I441 Atrioventricular block, second degree: Secondary | ICD-10-CM | POA: Diagnosis not present

## 2024-08-15 LAB — GLUCOSE, CAPILLARY
Glucose-Capillary: 151 mg/dL — ABNORMAL HIGH (ref 70–99)
Glucose-Capillary: 211 mg/dL — ABNORMAL HIGH (ref 70–99)
Glucose-Capillary: 164 mg/dL — ABNORMAL HIGH (ref 70–99)
Glucose-Capillary: 216 mg/dL — ABNORMAL HIGH (ref 70–99)

## 2024-08-15 LAB — RENAL FUNCTION PANEL
Albumin: 2 g/dL — ABNORMAL LOW (ref 3.5–5.0)
Anion gap: 9 (ref 5–15)
BUN: 55 mg/dL — ABNORMAL HIGH (ref 8–23)
CO2: 23 mmol/L (ref 22–32)
Calcium: 8.8 mg/dL — ABNORMAL LOW (ref 8.9–10.3)
Chloride: 103 mmol/L (ref 98–111)
Creatinine, Ser: 2.62 mg/dL — ABNORMAL HIGH (ref 0.61–1.24)
GFR, Estimated: 23 mL/min — ABNORMAL LOW (ref >60)
Glucose, Bld: 173 mg/dL — ABNORMAL HIGH (ref 70–99)
Phosphorus: 3.3 mg/dL (ref 2.5–4.6)
Potassium: 4.9 mmol/L (ref 3.5–5.1)
Sodium: 135 mmol/L (ref 135–145)

## 2024-08-15 NOTE — Progress Notes (Signed)
 Inpatient Rehab Admissions Coordinator:    CIR following. Rehab MD completed peer to peer today and I await decision from his insurance.  Leita Kleine, MS, CCC-SLP Rehab Admissions Coordinator  [phone] (celll) [phone] (office)

## 2024-08-15 NOTE — H&P (Incomplete)
 Physical Medicine and Rehabilitation Admission H&P    Chief Complaint  Patient presents with   Fall   Chest Pain   Weakness  : HPI: ***  ROS Past Medical History:  Diagnosis Date   Acid reflux disease    Arthritis    Complication of anesthesia    appendix surgery (89 year old) - passed out once patient got back to floor unit   Diabetes mellitus    Dyspnea    Family history of adverse reaction to anesthesia    daughter states that she has a difficult time waking up   Hiatal hernia    High cholesterol    Hypertension    Hypothyroidism    Lung cancer (HCC)    s/p XI robotic assisted thoracoscopy for LU lobectomy 06/22/20   Monoclonal gammopathy of unknown significance (MGUS) 03/28/2020   Normocytic anemia 03/28/2020   Renal disorder    renal insufficiency   Type 2 diabetes mellitus (HCC)    Past Surgical History:  Procedure Laterality Date   APPENDECTOMY     ARTERY BIOPSY Right 11/23/2023   Procedure: RIGHT BIOPSY TEMPORAL ARTERY;  Surgeon: Gretta Lonni PARAS, MD;  Location: [facility] OR;  Service: Vascular;  Laterality: Right;   BRONCHIAL BIOPSY  05/29/2020   Procedure: BRONCHIAL BIOPSIES;  Surgeon: Shelah Lamar RAMAN, MD;  Location: [facility] ENDOSCOPY;  Service: Pulmonary;;   BRONCHIAL BRUSHINGS  05/29/2020   Procedure: BRONCHIAL BRUSHINGS;  Surgeon: Shelah Lamar RAMAN, MD;  Location: [facility] ENDOSCOPY;  Service: Pulmonary;;   BRONCHIAL NEEDLE ASPIRATION BIOPSY  05/29/2020   Procedure: BRONCHIAL NEEDLE ASPIRATION BIOPSIES;  Surgeon: Shelah Lamar RAMAN, MD;  Location: [facility] ENDOSCOPY;  Service: Pulmonary;;   BRONCHIAL WASHINGS  05/29/2020   Procedure: BRONCHIAL WASHINGS;  Surgeon: Shelah Lamar RAMAN, MD;  Location: [facility] ENDOSCOPY;  Service: Pulmonary;;   CATARACT EXTRACTION W/PHACO Left 01/23/2023   Procedure: CATARACT EXTRACTION PHACO AND INTRAOCULAR LENS PLACEMENT (IOC);  Surgeon: Harrie Agent, MD;  Location: [facility] ORS;  Service: Ophthalmology;  Laterality: Left;  CDE: 13.69   CATARACT EXTRACTION  W/PHACO Right 02/20/2023   Procedure: CATARACT EXTRACTION PHACO AND INTRAOCULAR LENS PLACEMENT (IOC);  Surgeon: Harrie Agent, MD;  Location: [facility] ORS;  Service: Ophthalmology;  Laterality: Right;  CDE: 12.88   CERVICAL SPINE SURGERY     EYE SURGERY     strabismus   INTERCOSTAL NERVE BLOCK Left 06/22/2020   Procedure: INTERCOSTAL NERVE BLOCK;  Surgeon: Kerrin Elspeth BROCKS, MD;  Location: [facility] OR;  Service: Thoracic;  Laterality: Left;   NODE DISSECTION Left 06/22/2020   Procedure: NODE DISSECTION;  Surgeon: Kerrin Elspeth BROCKS, MD;  Location: [facility] OR;  Service: Thoracic;  Laterality: Left;   TONSILLECTOMY     VIDEO BRONCHOSCOPY WITH ENDOBRONCHIAL NAVIGATION N/A 05/29/2020   Procedure: VIDEO BRONCHOSCOPY WITH ENDOBRONCHIAL NAVIGATION;  Surgeon: Shelah Lamar RAMAN, MD;  Location: [facility] ENDOSCOPY;  Service: Pulmonary;  Laterality: N/A;   Family History  Problem Relation Age of Onset   Diabetes Mother    Cancer Father    Cancer Sister    Diabetes Brother    Cancer Brother    Diabetes Brother    Parkinson's disease Brother    Diabetes Brother    Social History:  reports that he quit smoking about 25 years ago. His smoking use included cigarettes. He started smoking about 72 years ago. He has a 23.5 pack-year smoking history. He has never used smokeless tobacco. He reports that he does not drink alcohol and does not use drugs. Allergies: No  Known Allergies Medications Prior to Admission  Medication Sig Dispense Refill   acetaminophen  (TYLENOL ) 500 MG tablet Take 2 tablets (1,000 mg total) by mouth every 6 (six) hours. (Patient taking differently: Take 500 mg by mouth daily as needed for headache, moderate pain (pain score 4-6) or mild pain (pain score 1-3).) 30 tablet 0   albuterol  (VENTOLIN  HFA) 108 (90 Base) MCG/ACT inhaler INAHLE 2 PUFFS BY MOUTH 4 TIMES A DAY AS NEEDED FOR COUGH/WHEEZING     diclofenac Sodium (VOLTAREN) 1 % GEL Apply 1 Application topically daily as needed (pain).     docusate  sodium (COLACE) 100 MG capsule Take 1 capsule (100 mg total) by mouth 2 (two) times daily as needed for mild constipation. (Patient taking differently: Take 100 mg by mouth 2 (two) times a week.) 10 capsule 0   ferrous sulfate 325 (65 FE) MG tablet Take 325 mg by mouth every Monday, Wednesday, and Friday.     gabapentin  (NEURONTIN ) 300 MG capsule Take 300 mg by mouth 3 (three) times daily.     glipiZIDE  (GLUCOTROL  XL) 2.5 MG 24 hr tablet Take 1.25 mg by mouth daily as needed (blood sugar above 160).     guaiFENesin (MUCINEX) 600 MG 12 hr tablet Take 600 mg by mouth 2 (two) times daily.     HYDROcodone -acetaminophen  (NORCO) 7.5-325 MG tablet Take 1 tablet by mouth every 6 (six) hours as needed for moderate pain (pain score 4-6). (Patient taking differently: Take 1 tablet by mouth daily as needed for moderate pain (pain score 4-6) or severe pain (pain score 7-10).) 10 tablet 0   levothyroxine  (SYNTHROID ) 75 MCG tablet Take 75 mcg by mouth at bedtime.     LOKELMA 5 g packet Take 5 g by mouth every Monday, Wednesday, and Friday.     Multiple Vitamin (MULTIVITAMIN ADULT PO) Take 1 tablet by mouth at bedtime.     Multiple Vitamins-Minerals (PRESERVISION AREDS PO) Take 1 tablet by mouth in the morning and at bedtime.     pantoprazole (PROTONIX) 20 MG tablet Take 20 mg by mouth daily.     rosuvastatin  (CRESTOR ) 40 MG tablet Take 40 mg by mouth daily.      telmisartan (MICARDIS) 20 MG tablet Take 10 mg by mouth daily.     VITAMIN D  PO Take 2 capsules by mouth at bedtime.     VITAMIN E PO Take 1 capsule by mouth at bedtime.     mupirocin ointment (BACTROBAN) 2 % Apply 1 Application topically 3 (three) times daily. (Patient not taking: Reported on 08/08/2024)     omeprazole (PRILOSEC) 20 MG capsule Take 20 mg by mouth daily. (Patient not taking: Reported on 08/08/2024)        Home: Home Living Family/patient expects to be discharged to:: Private residence Living Arrangements: Children  (daughter) Available Help at Discharge: Family, Available PRN/intermittently Type of Home: House Home Access: Ramped entrance Home Layout: One level Bathroom Shower/Tub: Health Visitor: Standard Bathroom Accessibility: Yes Home Equipment: Information systems manager - built in, Globe - single point, Rollator (4 wheels)   Functional History: Prior Function Prior Level of Function : Independent/Modified Independent Mobility Comments: Uses rollator for longer distances, prn use of cane in home ADLs Comments: Ind, daughter manages meds  Functional Status:  Mobility: Bed Mobility Overal bed mobility: Needs Assistance Bed Mobility: Rolling, Sidelying to Sit Rolling: Contact guard assist Sidelying to sit: Contact guard assist, HOB elevated, Used rails Supine to sit: Contact guard, HOB elevated, Used rails  General bed mobility comments: CGA  to ensure truncal balance. good effort reaching over to bedrail and bring LE to EOB Transfers Overall transfer level: Needs assistance Equipment used: Rolling walker (2 wheels) Transfers: Sit to/from Stand, Bed to chair/wheelchair/BSC Sit to Stand: Min assist Bed to/from chair/wheelchair/BSC transfer type:: Step pivot Step pivot transfers: Min assist General transfer comment: pt performs SPT from bed to [facility] and then to recliner Ambulation/Gait Ambulation/Gait assistance: Min assist Gait Distance (Feet): 6 Feet Assistive device: Rolling walker (2 wheels) Gait Pattern/deviations: Step-to pattern, Trunk flexed General Gait Details: pt with slowed step-to gait, PT notes shaky gait with BUE tremors at times. Gait velocity: reduced Gait velocity interpretation: <1.31 ft/sec, indicative of household ambulator Pre-gait activities: static march    ADL: ADL Overall ADL's : Needs assistance/impaired Eating/Feeding: Set up, Sitting Grooming: Minimal assistance, Sitting Upper Body Bathing: Moderate assistance, Sitting Lower Body Bathing: Moderate  assistance, Sitting/lateral leans Upper Body Dressing : Minimal assistance, Sitting Lower Body Dressing: Moderate assistance, Sitting/lateral leans Toilet Transfer: Minimal assistance, Stand-pivot, +2 for safety/equipment, BSC/3in1, Rolling walker (2 wheels) Toilet Transfer Details (indicate cue type and reason): cues for hand placement and step sequence, some unsteadiness noted Toileting- Clothing Manipulation and Hygiene: Moderate assistance Functional mobility during ADLs: Minimal assistance, +2 for safety/equipment, Rolling walker (2 wheels), Cueing for sequencing General ADL Comments: able to walk further than prior PT session yesterday (around 30 ft today), LE still with shakiness so close chair follow needed.  Cognition: Cognition Orientation Level: Oriented X4 Cognition Arousal: Alert Behavior During Therapy: WFL for tasks assessed/performed  Physical Exam: Blood pressure (!) 128/45, pulse (!) 49, temperature 98 F (36.7 C), temperature source Oral, resp. rate 19, height 5' 8 (1.727 m), weight 79.1 kg, SpO2 95%. Physical Exam  Results for orders placed or performed during the hospital encounter of 08/05/24 (from the past 48 hours)  Glucose, capillary     Status: Abnormal   Collection Time: 08/13/24  4:35 PM  Result Value Ref Range   Glucose-Capillary 212 (H) 70 - 99 mg/dL    Comment: Glucose reference range applies only to samples taken after fasting for at least 8 hours.  Glucose, capillary     Status: Abnormal   Collection Time: 08/13/24  8:43 PM  Result Value Ref Range   Glucose-Capillary 234 (H) 70 - 99 mg/dL    Comment: Glucose reference range applies only to samples taken after fasting for at least 8 hours.  Renal function panel     Status: Abnormal   Collection Time: 08/14/24  2:26 AM  Result Value Ref Range   Sodium 136 135 - 145 mmol/L   Potassium 4.5 3.5 - 5.1 mmol/L   Chloride 101 98 - 111 mmol/L   CO2 22 22 - 32 mmol/L   Glucose, Bld 141 (H) 70 - 99 mg/dL     Comment: Glucose reference range applies only to samples taken after fasting for at least 8 hours.   BUN 57 (H) 8 - 23 mg/dL   Creatinine, Ser 7.60 (H) 0.61 - 1.24 mg/dL   Calcium  8.7 (L) 8.9 - 10.3 mg/dL   Phosphorus 3.1 2.5 - 4.6 mg/dL   Albumin  2.1 (L) 3.5 - 5.0 g/dL   GFR, Estimated 25 (L) >60 mL/min    Comment: (NOTE) Calculated using the CKD-EPI Creatinine Equation (2021)    Anion gap 13 5 - 15    Comment: Performed at [facility], [address] [place], [place] [address]  Glucose, capillary  Status: Abnormal   Collection Time: 08/14/24  6:17 AM  Result Value Ref Range   Glucose-Capillary 128 (H) 70 - 99 mg/dL    Comment: Glucose reference range applies only to samples taken after fasting for at least 8 hours.  Glucose, capillary     Status: Abnormal   Collection Time: 08/14/24 12:18 PM  Result Value Ref Range   Glucose-Capillary 227 (H) 70 - 99 mg/dL    Comment: Glucose reference range applies only to samples taken after fasting for at least 8 hours.   Comment 1 Notify RN    Comment 2 Document in Chart   Glucose, capillary     Status: Abnormal   Collection Time: 08/14/24  3:55 PM  Result Value Ref Range   Glucose-Capillary 177 (H) 70 - 99 mg/dL    Comment: Glucose reference range applies only to samples taken after fasting for at least 8 hours.   Comment 1 Notify RN    Comment 2 Document in Chart   Glucose, capillary     Status: Abnormal   Collection Time: 08/14/24  8:40 PM  Result Value Ref Range   Glucose-Capillary 171 (H) 70 - 99 mg/dL    Comment: Glucose reference range applies only to samples taken after fasting for at least 8 hours.  Glucose, capillary     Status: Abnormal   Collection Time: 08/14/24  9:42 PM  Result Value Ref Range   Glucose-Capillary 157 (H) 70 - 99 mg/dL    Comment: Glucose reference range applies only to samples taken after fasting for at least 8 hours.  Renal function panel     Status: Abnormal   Collection Time: 08/15/24   2:35 AM  Result Value Ref Range   Sodium 135 135 - 145 mmol/L   Potassium 4.9 3.5 - 5.1 mmol/L   Chloride 103 98 - 111 mmol/L   CO2 23 22 - 32 mmol/L   Glucose, Bld 173 (H) 70 - 99 mg/dL    Comment: Glucose reference range applies only to samples taken after fasting for at least 8 hours.   BUN 55 (H) 8 - 23 mg/dL   Creatinine, Ser 7.37 (H) 0.61 - 1.24 mg/dL   Calcium  8.8 (L) 8.9 - 10.3 mg/dL   Phosphorus 3.3 2.5 - 4.6 mg/dL   Albumin  2.0 (L) 3.5 - 5.0 g/dL   GFR, Estimated 23 (L) >60 mL/min    Comment: (NOTE) Calculated using the CKD-EPI Creatinine Equation (2021)    Anion gap 9 5 - 15    Comment: Performed at [facility], [address], [place], [place] [address]  Glucose, capillary     Status: Abnormal   Collection Time: 08/15/24  5:28 AM  Result Value Ref Range   Glucose-Capillary 151 (H) 70 - 99 mg/dL    Comment: Glucose reference range applies only to samples taken after fasting for at least 8 hours.   No results found.    Blood pressure (!) 128/45, pulse (!) 49, temperature 98 F (36.7 C), temperature source Oral, resp. rate 19, height 5' 8 (1.727 m), weight 79.1 kg, SpO2 95%.  Medical Problem List and Plan: 1. Functional deficits secondary to ***  -patient may *** shower  -ELOS/Goals: *** 2.  Antithrombotics: -DVT/anticoagulation:  {VTE PROPHYLAXIS/ANTICOAGULATION - [id number]}  -antiplatelet therapy: *** 3. Pain Management: *** 4. Mood/Behavior/Sleep: ***  -antipsychotic agents: *** 5. Neuropsych/cognition: This patient *** capable of making decisions on *** own behalf. 6. Skin/Wound Care: *** 7. Fluids/Electrolytes/Nutrition: ***     ***  Sharlet GORMAN Schmitz, PA-C 08/15/2024

## 2024-08-15 NOTE — Progress Notes (Signed)
 Occupational Therapy Treatment Patient Details Name: William Wells MRN: [id number] DOB: [birth year] Today's Date: 08/15/2024   History of present illness Pt is an 89 y/o M presenting to ED on 10/31 wtih chest pressure, generalized weakness, fall, CT negative, EKG with ST elevation, evidence of AV block, suspect missing MI. Plan for possible PPM. Also with AKI. PMH includes lung CA s/p robotic assisted upper lobectomy, HTN, DM2, CKD IIIB   OT comments  Pt c/o fatigue, but eager to participate. Pt CGA for supine to sit, increased time using rails. Pt with fair balance sitting EOB. Pt min A for trasnfer to recliner, quickly felt the urge to use bathroom, feeling he couldn't hold it and worried he would have an accident during transfer to [facility]. Instructed on taking his time, breathing, performing kegal holds for as long as possible 3-5X before standing and transferring to reduce urge, improve incontinence, and reduce risk for falls while ambulating to restroom/BSC. Pt feeling better after kegels, min A to BSC. Pt required mod/max A for hygiene standing with RW, difficulty standing upright during hygiene due to weakness/fatigue, shaky when standing. Pt motivated to improve and able to ambulate 30 feet in room with increased time/effort. Pt left in chair with all needs met, family in room, will continue to see acutely to progress as able, DC plan still appropriate to postacute intensive rehab >3hrs/day.       If plan is discharge home, recommend the following:  Two people to help with walking and/or transfers;A lot of help with bathing/dressing/bathroom;Direct supervision/assist for medications management;Assist for transportation;Direct supervision/assist for financial management;Supervision due to cognitive status   Equipment Recommendations  Other (comment) (defer)    Recommendations for Other Services      Precautions / Restrictions Precautions Precautions: Fall Recall of Precautions/Restrictions:  Intact Precaution/Restrictions Comments: watch O2/HR Restrictions Weight Bearing Restrictions Per Provider Order: No       Mobility Bed Mobility Overal bed mobility: Needs Assistance Bed Mobility: Supine to Sit     Supine to sit: Contact guard, HOB elevated, Used rails     General bed mobility comments: increased time, CGA for safety    Transfers Overall transfer level: Needs assistance Equipment used: Rolling walker (2 wheels) Transfers: Sit to/from Stand, Bed to chair/wheelchair/BSC Sit to Stand: Min assist     Step pivot transfers: Min assist     General transfer comment: min A due to BLE weakness, shaky with standing/ambulating     Balance Overall balance assessment: Needs assistance Sitting-balance support: No upper extremity supported, Feet supported Sitting balance-Leahy Scale: Fair     Standing balance support: Bilateral upper extremity supported, During functional activity, Reliant on assistive device for balance Standing balance-Leahy Scale: Poor Standing balance comment: no overt LOB, reliant on RW for support, weakness/shaking during activity                           ADL either performed or assessed with clinical judgement   ADL Overall ADL's : Needs assistance/impaired                         Toilet Transfer: Minimal assistance;Stand-pivot;BSC/3in1;Rolling walker (2 wheels)   Toileting- Clothing Manipulation and Hygiene: Moderate assistance;Maximal assistance;Sit to/from stand       Functional mobility during ADLs: Minimal assistance;+2 for safety/equipment;Rolling walker (2 wheels);Cueing for sequencing General ADL Comments: Pt transfer to [facility], min A  with RW, Pt with no overt LOB but  shaky and feeling weak with transfers. Pt needs help wtih perineal hygiene, mod/max A, difficulty standing for hygiene with RW for support due to fatigue    Extremity/Trunk Assessment Upper Extremity Assessment Upper Extremity Assessment:  Generalized weakness            Vision       Perception     Praxis     Communication Communication Communication: No apparent difficulties   Cognition Arousal: Alert Behavior During Therapy: WFL for tasks assessed/performed Cognition: No apparent impairments             OT - Cognition Comments: not formally assessed but pt alert, following commands                 Following commands: Intact        Cueing   Cueing Techniques: Verbal cues  Exercises      Shoulder Instructions       General Comments      Pertinent Vitals/ Pain       Pain Assessment Pain Assessment: Faces Faces Pain Scale: Hurts little more Pain Location: buttocks Pain Descriptors / Indicators: Sore Pain Intervention(s): Monitored during session  Home Living                                          Prior Functioning/Environment              Frequency  Min 2X/week        Progress Toward Goals  OT Goals(current goals can now be found in the care plan section)  Progress towards OT goals: Progressing toward goals  Acute Rehab OT Goals Patient Stated Goal: to improve activity tolerance OT Goal Formulation: With patient Time For Goal Achievement: 08/24/24 Potential to Achieve Goals: Good ADL Goals Pt Will Perform Upper Body Dressing: sitting;with contact guard assist Pt Will Perform Lower Body Dressing: sitting/lateral leans;sit to/from stand;with contact guard assist Pt Will Transfer to Toilet: with supervision;ambulating Pt Will Perform Tub/Shower Transfer: Shower transfer;with min assist;ambulating;shower seat  Plan      Co-evaluation                 AM-PAC OT 6 Clicks Daily Activity     Outcome Measure   Help from another person eating meals?: None Help from another person taking care of personal grooming?: A Little Help from another person toileting, which includes using toliet, bedpan, or urinal?: A Lot Help from another person  bathing (including washing, rinsing, drying)?: A Lot Help from another person to put on and taking off regular upper body clothing?: A Little Help from another person to put on and taking off regular lower body clothing?: A Lot 6 Click Score: 16    End of Session Equipment Utilized During Treatment: Gait belt;Rolling walker (2 wheels)  OT Visit Diagnosis: Unsteadiness on feet (R26.81);Other abnormalities of gait and mobility (R26.89);Muscle weakness (generalized) (M62.81)   Activity Tolerance Patient tolerated treatment well   Patient Left in chair;with call bell/phone within reach;with family/visitor present   Nurse Communication Mobility status        Time: 1340-1415 OT Time Calculation (min): 35 min  Charges: OT General Charges $OT Visit: 1 Visit OT Treatments $Self Care/Home Management : 23-37 mins  [address] OTR/L   Aimee Timmons R Teyon Odette 08/15/2024, 2:26 PM

## 2024-08-15 NOTE — Care Management Important Message (Signed)
 Important Message  Patient Details  Name: William Wells MRN: [id number] Date of Birth: [birth year]   Important Message Given:  Yes - Medicare IM     Vonzell Arrie Sharps 08/15/2024, 11:23 AM

## 2024-08-15 NOTE — Plan of Care (Signed)
   Problem: Education: Goal: Knowledge of General Education information will improve Description: Including pain rating scale, medication(s)/side effects and non-pharmacologic comfort measures Outcome: Progressing   Problem: Clinical Measurements: Goal: Diagnostic test results will improve Outcome: Progressing   Problem: Activity: Goal: Risk for activity intolerance will decrease Outcome: Progressing

## 2024-08-15 NOTE — Progress Notes (Signed)
 [facility] PROGRESS NOTE   William Wells FMW:[id number] DOB: Sep 10, 1934 DOA: 08/05/2024  PCP: Shona Norleen PEDLAR, MD  Brief History:  89 y.o. male with past medical history of lung cancer s/p LUL lobectomy in 06/2020, HTN, HLD, DM2, and GERD who p/w cardiogenic shock in the setting of inferior MI along with complete heart block and acute kidney injury.     Consultants: Cardiology.  Nephrology has signed off  Procedures: Echocardiogram    Subjective/Interval History: Patient lying comfortably on the bed.  Noted to be somewhat deconditioned.  Denies any chest pain shortness of breath.  His daughter is at the bedside.    Assessment/Plan:  NSTEMI/inferior MI/cardiogenic shock Cardiology is following.  Echocardiogram showed LVEF of 50 to 55% with inferior akinesis. Patient was treated with aspirin Plavix heparin and statin.  Currently off of heparin.  On as needed nitroglycerin for chest pain. Cardiac status seems to be stable. Patient is on midodrine.  Heart block This was in the setting of inferior wall MI.  Initially presented with complete heart block but progression to 2-1 block.  No plans for pacemaker per cardiology.  Not on any AV blocking agents currently.  Acute kidney injury on chronic kidney disease stage IIIb Followed by Dr. Rachele in the outpatient setting.  Baseline creatinine is between 1.9-2.1. It appears that he came in with a creatinine of 3.6.  Peaked at 3.79 and has been improving.  Seems to have plateaued.  Avoid nephrotoxic agents.  Not noted to be on any diuretics or ACE inhibitor or ARB currently.  No hypotensive episodes noted as well. Patient may have a new baseline.  Monitor urine output. Follow-up with his nephrologist in the outpatient setting.  Acute urinary retention Did not pass voiding trial.  Will be discharged with Foley catheter.  Will need to be seen by urology in their office after discharge. Patient noted to be on Flomax   Normocytic  anemia Hemoglobin is slightly lower than his baseline.  No overt bleeding.  Dilutional drop is suspected. Anemia panel from earlier this year showed ferritin of 66, iron of 68, TIBC 305, percent saturation 22. B12 level was normal in 2024.  Will recheck tomorrow.  Hypothyroidism Continue levothyroxine .   DVT Prophylaxis: Subcutaneous heparin Code Status: DNR Family Communication: Discussed with patient's daughters Disposition Plan: CIR is being pursued   Medications: Scheduled:  aspirin EC  81 mg Oral Daily   atorvastatin  80 mg Oral Daily   Chlorhexidine  Gluconate Cloth  6 each Topical Daily   clopidogrel  75 mg Oral Daily   gabapentin   300 mg Oral BID   guaiFENesin  600 mg Oral BID   heparin injection (subcutaneous)  5,000 Units Subcutaneous Q8H   insulin  aspart  0-15 Units Subcutaneous TID WC   insulin  aspart  0-5 Units Subcutaneous QHS   insulin  glargine-yfgn  7 Units Subcutaneous Daily   levothyroxine   75 mcg Oral Q0600   midodrine  10 mg Oral TID WC   pantoprazole  40 mg Oral Daily   tamsulosin   0.4 mg Oral Daily   Continuous: PRN:acetaminophen , Gerhardt's butt cream, ondansetron  (ZOFRAN ) IV, mouth rinse, oxyCODONE , polyethylene glycol  Antibiotics: Anti-infectives (From admission, onward)    Start     Dose/Rate Route Frequency Ordered Stop   08/08/24 1645  cefTRIAXone (ROCEPHIN) 2 g in sodium chloride  0.9 % 100 mL IVPB        2 g 200 mL/hr over 30 Minutes Intravenous Every 24 hours 08/08/24 1552 08/13/24 1655  Objective:  Vital Signs  Vitals:   08/15/24 0432 08/15/24 0500 08/15/24 0526 08/15/24 0600  BP:  (!) 116/49  120/65  Pulse:  (!) 37 (!) 43 (!) 27  Resp:      Temp:      TempSrc:      SpO2:  97% 98% 97%  Weight: 79.9 kg  79.1 kg   Height:   5' 8 (1.727 m)     Intake/Output Summary (Last 24 hours) at 08/15/2024 0907 Last data filed at 08/15/2024 0600 Gross per 24 hour  Intake 360 ml  Output 2100 ml  Net -1740 ml   Filed Weights    08/13/24 1224 08/15/24 0432 08/15/24 0526  Weight: 80.8 kg 79.9 kg 79.1 kg    General appearance: Awake alert.  In no distress Resp: Clear to auscultation bilaterally.  Normal effort Cardio: S1-S2 is normal regular.  No S3-S4.  No rubs murmurs or bruit GI: Abdomen is soft.  Nontender nondistended.  Bowel sounds are present normal.  No masses organomegaly Extremities: No edema.  Full range of motion of lower extremities.  Physical deconditioning noted Neurologic: Alert and oriented x3.  No focal neurological deficits.    Lab Results:  Data Reviewed: I have personally reviewed following labs and reports of the imaging studies  CBC: Recent Labs  Lab 08/08/24 1044 08/09/24 0325 08/10/24 0336 08/12/24 0327 08/13/24 0220  WBC 11.9* 9.7 7.4 7.8 9.4  HGB 10.5* 9.1* 9.0* 9.8* 9.3*  HCT 31.6* 26.5* 26.2* 28.7* 27.6*  MCV 90.8 88.6 88.2 87.0 89.3  PLT 144* 140* 130* 215 230    Basic Metabolic Panel: Recent Labs  Lab 08/08/24 1044 08/09/24 0325 08/11/24 0245 08/12/24 0327 08/13/24 0220 08/14/24 0226 08/15/24 0235  NA 129*   < > 132* 135 133* 136 135  K 4.7   < > 4.0 4.4 4.4 4.5 4.9  CL 99   < > 97* 100 100 101 103  CO2 17*   < > 22 21* 22 22 23   GLUCOSE 175*   < > 131* 154* 190* 141* 173*  BUN 51*   < > 68* 65* 62* 57* 55*  CREATININE 3.68*   < > 3.79* 3.50* 2.85* 2.39* 2.62*  CALCIUM  8.0*   < > 8.2* 8.3* 8.4* 8.7* 8.8*  MG 2.0  --   --   --   --   --   --   PHOS  --   --  4.2 3.2 3.1 3.1 3.3   < > = values in this interval not displayed.    GFR: Estimated Creatinine Clearance: 18.9 mL/min (A) (by C-G formula based on SCr of 2.62 mg/dL (H)).  Liver Function Tests: Recent Labs  Lab 08/08/24 1044 08/09/24 0325 08/10/24 0336 08/11/24 0245 08/12/24 0327 08/13/24 0220 08/14/24 0226 08/15/24 0235  AST 29 92* 216* 131*  --   --   --   --   ALT 41 101* 218* 221*  --   --   --   --   ALKPHOS 54 74 96 111  --   --   --   --   BILITOT 0.9 0.8 0.6 0.6  --   --   --   --    PROT 6.1* 5.7* 3.6* 6.1*  --   --   --   --   ALBUMIN  2.2* 2.0* 1.8* 2.0*  2.1* 2.1* 2.0* 2.1* 2.0*    CBG: Recent Labs  Lab 08/14/24 1218 08/14/24 1555 08/14/24 2040 08/14/24  2142 08/15/24 0528  GLUCAP 227* 177* 171* 157* 151*    Recent Results (from the past 240 hours)  MRSA Next Gen by PCR, Nasal     Status: None   Collection Time: 08/06/24  9:32 AM   Specimen: Nasal Mucosa; Nasal Swab  Result Value Ref Range Status   MRSA by PCR Next Gen NOT DETECTED NOT DETECTED Final    Comment: (NOTE) The GeneXpert MRSA Assay (FDA approved for NASAL specimens only), is one component of a comprehensive MRSA colonization surveillance program. It is not intended to diagnose MRSA infection nor to guide or monitor treatment for MRSA infections. Test performance is not FDA approved in patients less than 46 years old. Performed at [facility], [address] [place], [place] [address]   Culture, blood (Routine X 2) w Reflex to ID Panel     Status: None   Collection Time: 08/08/24  2:52 PM   Specimen: BLOOD RIGHT HAND  Result Value Ref Range Status   Specimen Description BLOOD RIGHT HAND  Final   Special Requests   Final    BOTTLES DRAWN AEROBIC ONLY Blood Culture adequate volume   Culture   Final    NO GROWTH 5 DAYS Performed at [facility], [address], [place], [place] [address]    Report Status 08/13/2024 FINAL  Final  Culture, blood (Routine X 2) w Reflex to ID Panel     Status: None   Collection Time: 08/08/24  2:53 PM   Specimen: BLOOD  Result Value Ref Range Status   Specimen Description BLOOD SITE NOT SPECIFIED  Final   Special Requests   Final    BOTTLES DRAWN AEROBIC AND ANAEROBIC Blood Culture adequate volume   Culture   Final    NO GROWTH 5 DAYS Performed at [facility], [address] [place], [place] [address]    Report Status 08/13/2024 FINAL  Final      Radiology Studies: No results found.     LOS: 10 days   Alfred Eckley  [employer] on [email or web address]  08/15/2024, 9:07 AM

## 2024-08-15 NOTE — TOC Progression Note (Signed)
 Transition of Care [facility]) - Progression Note    Patient Details  Name: William Wells MRN: [id number] Date of Birth: [birth year]  Transition of Care [facility]) CM/SW Contact  Luise JAYSON Pan, [place] Phone Number: 08/15/2024, 3:46 PM  Clinical Narrative:   CIR following patient. Please refer to Rehab Coordinators note for update.  ICM will continue to monitor patient through daily progression meetings.   Expected Discharge Plan: IP Rehab Facility Barriers to Discharge: Continued Medical Work up               Expected Discharge Plan and Services   Discharge Planning Services: CM Consult   Living arrangements for the past 2 months: Single Family Home                                       Social Drivers of Health (SDOH) Interventions SDOH Screenings   Food Insecurity: No Food Insecurity (08/06/2024)  Housing: Low Risk  (08/06/2024)  Transportation Needs: No Transportation Needs (08/06/2024)  Utilities: Not At Risk (08/06/2024)  Alcohol Screen: Low Risk  (10/25/2020)  Depression (PHQ2-9): Low Risk  (04/19/2024)  Financial Resource Strain: Low Risk  (10/25/2020)  Physical Activity: Sufficiently Active (10/25/2020)  Social Connections: Moderately Isolated (08/06/2024)  Stress: No Stress Concern Present (10/25/2020)  Tobacco Use: Medium Risk (08/06/2024)    Readmission Risk Interventions     No data to display

## 2024-08-15 NOTE — Plan of Care (Signed)

## 2024-08-15 NOTE — Progress Notes (Addendum)
 Progress Note  Patient Name: William Wells Date of Encounter: 08/15/2024 [facility] Cardiologist: Vishnu P Mallipeddi, MD  Interval Summary    Weak this morning, but no complaints. Did try to get up yesterday but had a bowel movement and getting cleaned up caused him to be very fatigued.   Vital Signs Vitals:   08/15/24 0500 08/15/24 0526 08/15/24 0600 08/15/24 0900  BP: (!) 116/49  120/65 (!) 119/49  Pulse: (!) 37 (!) 43 (!) 27 (!) 51  Resp:    17  Temp:    98.2 F (36.8 C)  TempSrc:    Oral  SpO2: 97% 98% 97% 96%  Weight:  79.1 kg    Height:  5' 8 (1.727 m)      Intake/Output Summary (Last 24 hours) at 08/15/2024 1018 Last data filed at 08/15/2024 0600 Gross per 24 hour  Intake --  Output 2100 ml  Net -2100 ml      08/15/2024    5:26 AM 08/15/2024    4:32 AM 08/13/2024   12:24 PM  Last 3 Weights  Weight (lbs) 174 lb 6.1 oz 176 lb 2.4 oz 178 lb 2.1 oz  Weight (kg) 79.1 kg 79.9 kg 80.8 kg      Telemetry/ECG  2:I AVB rates in the 40-50s - Personally Reviewed  Physical Exam  GEN: No acute distress.   Neck: No JVD Cardiac: RRR, no murmurs, rubs, or gallops.  Respiratory: Clear to auscultation bilaterally. GI: Soft, nontender, non-distended  MS: No edema  Assessment & Plan   89 y.o. male with a PMH of CKD, HTN, lung cancer s/p lobectomy who presented with chest pain and complete heart block.   Inferior MI -- per notes, felt to be possible missed MI, no actionable STEMI noted on EKG.  This was discussed on admission but he was managed medically given his worsening CKD and age. -- [place]itivity [place] at [address] -- Treated with heparin for 48 hours, aspirin, Plavix, atorvastatin 80 mg daily -- Echo 11/1 LVEF of 50-55%, g1DD, normal RV, moderately elevated PASP 56.62mmHg   Complete Heart Block -- in the setting of inferior MI, initially CHB, progressed to 2:1 AVB -- seen EP, no plans for PPM at this time -- follow on telemetry, no avoid  nodal agents   AKI on CKD stage III Acute urinary retention  -- Baseline creatinine of 1.8 -- Creatinine peaked at 4.2, now down-trending 2.62 today in the setting of heart block and inferior MI, as well as obstruction -- s/p foley removal 11/4, replaced 11/5. On flomax . Net - 11.8L -- nephrology following  Hypotension Cardiogenic shock -- as above in the setting of MI -- initially required pressors and weaned  -- on midodrine 10mg  TID, stable BPs -- GDMT: limited with soft BPs and renal function   CIR following for potential placement   For questions or updates, please contact [facility] Please consult [email or web address] for contact info under    Signed, [name], MD   Lynwood LELON Banks was seen by me today along with Manuelita Charleston, NP. I have personally performed an evaluation on this patient.  My findings are as follows: 89 y.o. male with history of CKD, HTN, lung cancer s/p lobectomy admitted with complete heart block and missed STEMI. He was not felt to be a candidate for cardiac cath given CKD and advanced age. No cardiac cath has been performed. He was managed by the Advanced Heart Failure team over the past week and  has been followed by our EP team and Nephrology. No plans for a pacemaker at this time.   Data: EKG(s) and pertinent labs, studies, etc were personally reviewed and interpreted by me:  No EKG today Telemetry reviewed by me: sinus with 2:1 AV block, rates 40-55 bpm Labs reviewed by me Otherwise, I agree with data as outlined by the advanced practice provider.  Exam performed by me: Gen: NAD, elderly male Neck: no JVD Cardiac: Irregular Lungs: clear bilaterally Extremities: No LE edema  My Assessment and Plan:  Inferior MI: Late presenting MI with cardiogenic shock. Shock has now resolved. No plans for cardiac cath. He is not a good candidate for cath given advanced age and acute on chronic renal failure. He has overall preserved LV  systolic function by echo. No chest pain. Plan to continue ASA, Plavix and statin.   Complete heart block: 2:1 AV block today. He has been followed by our EP team for 2:1 AV block over the past few days. Rates in the 40s and 50s. No plans for a pacemaker. Avoid AV nodal blocking agents. Assess HR with exercise this week.   Acute kidney injury on CKD stage III: Nephrology following  Will need rehab placement  Signed,  [name], MD  08/15/2024 10:18 AM

## 2024-08-16 DIAGNOSIS — E875 Hyperkalemia: Secondary | ICD-10-CM | POA: Diagnosis not present

## 2024-08-16 DIAGNOSIS — N179 Acute kidney failure, unspecified: Secondary | ICD-10-CM | POA: Diagnosis not present

## 2024-08-16 DIAGNOSIS — I2119 ST elevation (STEMI) myocardial infarction involving other coronary artery of inferior wall: Secondary | ICD-10-CM | POA: Diagnosis not present

## 2024-08-16 DIAGNOSIS — I441 Atrioventricular block, second degree: Secondary | ICD-10-CM | POA: Diagnosis not present

## 2024-08-16 LAB — CBC
HCT: 29 % — ABNORMAL LOW (ref 39.0–52.0)
Hemoglobin: 9.6 g/dL — ABNORMAL LOW (ref 13.0–17.0)
MCH: 29.8 pg (ref 26.0–34.0)
MCHC: 33.1 g/dL (ref 30.0–36.0)
MCV: 90.1 fL (ref 80.0–100.0)
Platelets: 298 K/uL (ref 150–400)
RBC: 3.22 MIL/uL — ABNORMAL LOW (ref 4.22–5.81)
RDW: 14.8 % (ref 11.5–15.5)
WBC: 9.6 K/uL (ref 4.0–10.5)
nRBC: 0 % (ref 0.0–0.2)

## 2024-08-16 LAB — POTASSIUM: Potassium: 4.9 mmol/L (ref 3.5–5.1)

## 2024-08-16 LAB — BASIC METABOLIC PANEL WITH GFR
Anion gap: 10 (ref 5–15)
BUN: 52 mg/dL — ABNORMAL HIGH (ref 8–23)
CO2: 23 mmol/L (ref 22–32)
Calcium: 9.3 mg/dL (ref 8.9–10.3)
Chloride: 102 mmol/L (ref 98–111)
Creatinine, Ser: 2.52 mg/dL — ABNORMAL HIGH (ref 0.61–1.24)
GFR, Estimated: 24 mL/min — ABNORMAL LOW (ref >60)
Glucose, Bld: 152 mg/dL — ABNORMAL HIGH (ref 70–99)
Potassium: 6.3 mmol/L (ref 3.5–5.1)
Sodium: 135 mmol/L (ref 135–145)

## 2024-08-16 LAB — FOLATE: Folate: 17.2 ng/mL (ref >5.9)

## 2024-08-16 LAB — GLUCOSE, CAPILLARY
Glucose-Capillary: 216 mg/dL — ABNORMAL HIGH (ref 70–99)
Glucose-Capillary: 223 mg/dL — ABNORMAL HIGH (ref 70–99)
Glucose-Capillary: 178 mg/dL — ABNORMAL HIGH (ref 70–99)
Glucose-Capillary: 338 mg/dL — ABNORMAL HIGH (ref 70–99)

## 2024-08-16 LAB — TROPONIN I (HIGH SENSITIVITY): Troponin I (High Sensitivity): 458 ng/L (ref <18)

## 2024-08-16 LAB — VITAMIN B12: Vitamin B-12: 1016 pg/mL — ABNORMAL HIGH (ref 180–914)

## 2024-08-16 MED ORDER — SODIUM ZIRCONIUM CYCLOSILICATE 10 G PO PACK
10.0000 g | PACK | Freq: Once | ORAL | Status: AC
  Administered 2024-08-16: 10 g via ORAL
  Filled 2024-08-16: qty 1

## 2024-08-16 MED ORDER — INSULIN ASPART 100 UNIT/ML IJ SOLN
5.0000 [IU] | Freq: Once | INTRAMUSCULAR | Status: AC
  Administered 2024-08-16: 5 [IU] via INTRAVENOUS
  Filled 2024-08-16: qty 5

## 2024-08-16 MED ORDER — DEXTROSE 50 % IV SOLN
1.0000 | Freq: Once | INTRAVENOUS | Status: AC
  Administered 2024-08-16: 50 mL via INTRAVENOUS
  Filled 2024-08-16: qty 50

## 2024-08-16 MED ORDER — CALCIUM GLUCONATE-NACL 1-0.675 GM/50ML-% IV SOLN
1.0000 g | Freq: Once | INTRAVENOUS | Status: AC
  Administered 2024-08-16: 1000 mg via INTRAVENOUS
  Filled 2024-08-16: qty 50

## 2024-08-16 MED ORDER — SODIUM ZIRCONIUM CYCLOSILICATE 5 G PO PACK
5.0000 g | PACK | ORAL | Status: DC
  Administered 2024-08-17 – 2024-08-22 (×3): 5 g via ORAL
  Filled 2024-08-16 (×3): qty 1

## 2024-08-16 MED ORDER — ALUM & MAG HYDROXIDE-SIMETH 200-200-20 MG/5ML PO SUSP: 30.0000 mL | ORAL | Status: DC | PRN

## 2024-08-16 NOTE — Progress Notes (Signed)
 Mobility Specialist Progress Note:    08/16/24 1330  Mobility  Activity Turned to back - supine (Ankle Pumps, Leg Lifts, Heel Slides)  Level of Assistance Standby assist, set-up cues, supervision of patient - no hands on  Assistive Device None  Range of Motion/Exercises Active Assistive  Activity Response Tolerated fair  Mobility Referral Yes  Mobility visit 1 Mobility  Mobility Specialist Start Time (ACUTE ONLY) 1330  Mobility Specialist Stop Time (ACUTE ONLY) 1338  Mobility Specialist Time Calculation (min) (ACUTE ONLY) 8 min   Receive pt laying in bed eating lunch agreeable to session. Pt able to perform movements decently once demonstrated correct technique. No c/o any symptoms but seemed tired but responsive. Left pt in bed w/ all needs met.   [name] Mobility Specialist Please Neurosurgeon or Rehab Office at [phone]

## 2024-08-16 NOTE — TOC Progression Note (Addendum)
 Transition of Care [facility]) - Progression Note    Patient Details  Name: William Wells MRN: [id number] Date of Birth: 04/28/1934  Transition of Care [facility]) CM/SW Contact  Luise JAYSON Pan, [place] Phone Number: 08/16/2024, 12:09 PM  Clinical Narrative:   Insurance denied CIR, family requesting appeal. CIR to submit for expedited appeal. Per IP rehab coordinator, family would like to have backup plan for SNF. CSW spoke with Verneita and confirmed. CSW explained SNF/insurance process. CSW to start SNF workup. Thomasine provided her email for CSW to send bed offers to [email or web address]).  3:21 PM CSW emailed Verneita the [employer].gov rating and list of accepting SNF facilities.   4:12 PM CSW provided bed offers to patient and daughter, Merlynn, at bedside. Merlynn inquired if CSW can fax offers to [facility] facilities. CSW faxed out the referrals at this time.   CSW will continue to follow.    Expected Discharge Plan: IP Rehab Facility Barriers to Discharge: Continued Medical Work up               Expected Discharge Plan and Services   Discharge Planning Services: CM Consult   Living arrangements for the past 2 months: Single Family Home                                       Social Drivers of Health (SDOH) Interventions SDOH Screenings   Food Insecurity: No Food Insecurity (08/06/2024)  Housing: Low Risk  (08/06/2024)  Transportation Needs: No Transportation Needs (08/06/2024)  Utilities: Not At Risk (08/06/2024)  Alcohol Screen: Low Risk  (10/25/2020)  Depression (PHQ2-9): Low Risk  (04/19/2024)  Financial Resource Strain: Low Risk  (10/25/2020)  Physical Activity: Sufficiently Active (10/25/2020)  Social Connections: Moderately Isolated (08/06/2024)  Stress: No Stress Concern Present (10/25/2020)  Tobacco Use: Medium Risk (08/06/2024)    Readmission Risk Interventions     No data to display

## 2024-08-16 NOTE — Progress Notes (Signed)
 [facility] PROGRESS NOTE   William Wells FMW:[id number] DOB: [birth year] DOA: 08/05/2024  PCP: Shona Norleen PEDLAR, MD  Brief History:  89 y.o. male with past medical history of lung cancer s/p LUL lobectomy in 06/2020, HTN, HLD, DM2, and GERD who p/w cardiogenic shock in the setting of inferior MI along with complete heart block and acute kidney injury.     Consultants: Cardiology.  Nephrology has signed off  Procedures: Echocardiogram    Subjective/Interval History: Upon my initial assessment patient was sitting on the bed eating his breakfast.  Did not have any complaints.  After he finished eating his breakfast he started developing pressure-like sensation in his chest.  Daughter was at the bedside.    Assessment/Plan:  NSTEMI/inferior MI/cardiogenic shock Cardiology is following.  Echocardiogram showed LVEF of 50 to 55% with inferior akinesis. Patient was treated with aspirin Plavix heparin and statin.  Currently off of heparin.  On as needed nitroglycerin for chest pain. Patient complaining of chest pain this morning after eating his breakfast.  EKG did not show any ischemic changes.  Does show AV block as seen previously.  Nursing staff to notify cardiology.  Will check troponin x 1.  Patient is already on PPI.  Can be given Maalox. Patient is on midodrine.  Heart block This was in the setting of inferior wall MI.  Initially presented with complete heart block but progression to 2-1 block.  No plans for pacemaker per cardiology.  Not on any AV blocking agents currently.  Acute kidney injury on chronic kidney disease stage IIIb Followed by Dr. Rachele in the outpatient setting.  Baseline creatinine is between 1.9-2.1. It appears that he came in with a creatinine of 3.6.  Peaked at 3.79 and has been improving.   Avoid nephrotoxic agents.  Not noted to be on any diuretics or ACE inhibitor or ARB currently.  No hypotensive episodes noted as well. Patient likely has a new  baseline.  This was explained to the patient and family. Follow-up with his nephrologist in the outpatient setting.  Hyperkalemia Noted to have a potassium of 6.3 this morning.  Patient was given calcium  gluconate along with dextrose  and insulin .  Repeat potassium is normal at 4.9. Daughter mentions that patient is supposed to be on Lokelma 3 times a week.  We will resume this.  Acute urinary retention Did not pass voiding trial.  Will be discharged with Foley catheter.  Will need to be seen by urology in their office after discharge. Patient noted to be on Flomax   Normocytic anemia Hemoglobin is slightly lower than his baseline.  No overt bleeding.  Dilutional drop is suspected. Anemia panel from earlier this year showed ferritin of 66, iron of 68, TIBC 305, percent saturation 22. Folate is 17.2.  Vitamin B12 is 1016.  Hypothyroidism Continue levothyroxine .   DVT Prophylaxis: Subcutaneous heparin Code Status: DNR Family Communication: Discussed with patient's daughters Disposition Plan: CIR is being pursued   Medications: Scheduled:  aspirin EC  81 mg Oral Daily   atorvastatin  80 mg Oral Daily   Chlorhexidine  Gluconate Cloth  6 each Topical Daily   clopidogrel  75 mg Oral Daily   gabapentin   300 mg Oral BID   guaiFENesin  600 mg Oral BID   heparin injection (subcutaneous)  5,000 Units Subcutaneous Q8H   insulin  aspart  0-15 Units Subcutaneous TID WC   insulin  aspart  0-5 Units Subcutaneous QHS   insulin  glargine-yfgn  7 Units Subcutaneous Daily  levothyroxine   75 mcg Oral Q0600   midodrine  10 mg Oral TID WC   pantoprazole  40 mg Oral Daily   tamsulosin   0.4 mg Oral Daily   Continuous: PRN:acetaminophen , alum & mag hydroxide-simeth, Gerhardt's butt cream, ondansetron  (ZOFRAN ) IV, mouth rinse, oxyCODONE , polyethylene glycol  Antibiotics: Anti-infectives (From admission, onward)    Start     Dose/Rate Route Frequency Ordered Stop   08/08/24 1645  cefTRIAXone  (ROCEPHIN) 2 g in sodium chloride  0.9 % 100 mL IVPB        2 g 200 mL/hr over 30 Minutes Intravenous Every 24 hours 08/08/24 1552 08/13/24 1655       Objective:  Vital Signs  Vitals:   08/15/24 1922 08/16/24 0017 08/16/24 0546 08/16/24 0814  BP: (!) 119/46 (!) 116/41 (!) 113/97 (!) 126/49  Pulse: (!) 42 (!) 47 (!) 58   Resp: 18 18 18    Temp: 97.6 F (36.4 C) 97.7 F (36.5 C) (!) 97.3 F (36.3 C)   TempSrc: Oral Oral Oral   SpO2: 94% 97% 99% 100%  Weight:   79.1 kg   Height:   5' 8 (1.727 m)     Intake/Output Summary (Last 24 hours) at 08/16/2024 1004 Last data filed at 08/16/2024 1000 Gross per 24 hour  Intake 498.48 ml  Output 2810 ml  Net -2311.52 ml   Filed Weights   08/15/24 0432 08/15/24 0526 08/16/24 0546  Weight: 79.9 kg 79.1 kg 79.1 kg    General appearance: Awake alert.  In no distress Resp: Clear to auscultation bilaterally.  Normal effort Cardio: S1-S2 is normal regular.  No S3-S4.  No rubs murmurs or bruit GI: Abdomen is soft.  Nontender nondistended.  Bowel sounds are present normal.  No masses organomegaly Extremities: No edema.  Full range of motion of lower extremities. Neurologic: Alert and oriented x3.  No focal neurological deficits.    Lab Results:  Data Reviewed: I have personally reviewed following labs and reports of the imaging studies  CBC: Recent Labs  Lab 08/10/24 0336 08/12/24 0327 08/13/24 0220 08/16/24 0250  WBC 7.4 7.8 9.4 9.6  HGB 9.0* 9.8* 9.3* 9.6*  HCT 26.2* 28.7* 27.6* 29.0*  MCV 88.2 87.0 89.3 90.1  PLT 130* 215 230 298    Basic Metabolic Panel: Recent Labs  Lab 08/11/24 0245 08/12/24 0327 08/13/24 0220 08/14/24 0226 08/15/24 0235 08/16/24 0250 08/16/24 0826  NA 132* 135 133* 136 135 135  --   K 4.0 4.4 4.4 4.5 4.9 6.3* 4.9  CL 97* 100 100 101 103 102  --   CO2 22 21* 22 22 23 23   --   GLUCOSE 131* 154* 190* 141* 173* 152*  --   BUN 68* 65* 62* 57* 55* 52*  --   CREATININE 3.79* 3.50* 2.85* 2.39*  2.62* 2.52*  --   CALCIUM  8.2* 8.3* 8.4* 8.7* 8.8* 9.3  --   PHOS 4.2 3.2 3.1 3.1 3.3  --   --     GFR: Estimated Creatinine Clearance: 19.6 mL/min (A) (by C-G formula based on SCr of 2.52 mg/dL (H)).  Liver Function Tests: Recent Labs  Lab 08/10/24 0336 08/11/24 0245 08/12/24 0327 08/13/24 0220 08/14/24 0226 08/15/24 0235  AST 216* 131*  --   --   --   --   ALT 218* 221*  --   --   --   --   ALKPHOS 96 111  --   --   --   --  BILITOT 0.6 0.6  --   --   --   --   PROT 3.6* 6.1*  --   --   --   --   ALBUMIN  1.8* 2.0*  2.1* 2.1* 2.0* 2.1* 2.0*    CBG: Recent Labs  Lab 08/15/24 0528 08/15/24 1147 08/15/24 1552 08/15/24 2055 08/16/24 0706  GLUCAP 151* 211* 164* 216* 216*    Recent Results (from the past 240 hours)  Culture, blood (Routine X 2) w Reflex to ID Panel     Status: None   Collection Time: 08/08/24  2:52 PM   Specimen: BLOOD RIGHT HAND  Result Value Ref Range Status   Specimen Description BLOOD RIGHT HAND  Final   Special Requests   Final    BOTTLES DRAWN AEROBIC ONLY Blood Culture adequate volume   Culture   Final    NO GROWTH 5 DAYS Performed at [facility], [address], [place], [place] [address]    Report Status 08/13/2024 FINAL  Final  Culture, blood (Routine X 2) w Reflex to ID Panel     Status: None   Collection Time: 08/08/24  2:53 PM   Specimen: BLOOD  Result Value Ref Range Status   Specimen Description BLOOD SITE NOT SPECIFIED  Final   Special Requests   Final    BOTTLES DRAWN AEROBIC AND ANAEROBIC Blood Culture adequate volume   Culture   Final    NO GROWTH 5 DAYS Performed at [facility], [address] [place], [place] [address]    Report Status 08/13/2024 FINAL  Final      Radiology Studies: No results found.     LOS: 11 days   Nafis Farnan [employer] on [email or web address]  08/16/2024, 10:04 AM

## 2024-08-16 NOTE — Progress Notes (Signed)
 Inpatient Rehab Admissions Coordinator:   Insurance denied patient's case for CIR. Family wishes to appeal. I will send his expedited appeal today.   Thank you,   Leita

## 2024-08-16 NOTE — NC FL2 (Signed)
 [place]  MEDICAID FL2 LEVEL OF CARE FORM     IDENTIFICATION  Patient Name: William Wells Birthdate: 19-Nov-1933 Sex: male Admission Date (Current Location): 08/05/2024  [facility] and [place] Number:  Producer, Television/film/video and Address:  [facility] [place], [address] [place], [place] [address]      Provider Number: [id number]  Attending Physician Name and Address:  Zenaida Morene PARAS, MD  Relative Name and Phone Number:  Orville Boyer (Daughter)  [phone]; Holley,Joyce (Daughter) [phone]    Current Level of Care: Hospital Recommended Level of Care: Skilled Nursing Facility Prior Approval Number:    Date Approved/Denied:   PASRR Number: [id number]  Discharge Plan: SNF    Current Diagnoses: Patient Active Problem List   Diagnosis Date Noted   AKI (acute kidney injury) 08/15/2024   Acute ST elevation myocardial infarction (STEMI) of inferior wall (HCC) 08/05/2024   Radiculopathy, lumbar region 06/08/2024   DOE (dyspnea on exertion) 01/05/2024   HLD (hyperlipidemia) 01/05/2024   AV block, 2nd degree 01/05/2024   Headache 11/10/2023   Adenocarcinoma of left lung, stage 1 (HCC) 06/26/2020   Status post robot-assisted surgical procedure 06/22/2020   S/P robot-assisted surgical procedure 06/22/2020   Mass of upper lobe of left lung 04/11/2020   Monoclonal gammopathy of unknown significance (MGUS) 03/28/2020   Normocytic anemia 03/28/2020   Hyperkalemia 07/15/2019   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) 07/15/2019   Benign hypertension with chronic kidney disease 07/15/2019   Sciatica, left side 02/17/2018    Orientation RESPIRATION BLADDER Height & Weight     Self, Time, Situation, Place  Normal Incontinent, Indwelling catheter Weight: 174 lb 6.1 oz (79.1 kg) Height:  5' 8 (172.7 cm)  BEHAVIORAL SYMPTOMS/MOOD NEUROLOGICAL BOWEL NUTRITION STATUS      Incontinent Diet (See discharge summary)  AMBULATORY STATUS  COMMUNICATION OF NEEDS Skin   Extensive Assist Verbally PU Stage and Appropriate Care (Pressure Injury Buttocks Right Deep Tissue Pressure Injury; Pressure Injury Coccyx Medial Stage 2)                       Personal Care Assistance Level of Assistance  Feeding, Dressing, Bathing Bathing Assistance: Limited assistance Feeding assistance: Independent Dressing Assistance: Limited assistance     Functional Limitations Info  Sight, Hearing, Speech Sight Info: Impaired Financial Trader) Hearing Info: Adequate Speech Info: Adequate    SPECIAL CARE FACTORS FREQUENCY  PT (By licensed PT), OT (By licensed OT)     PT Frequency: 5x week OT Frequency: 5x week            Contractures Contractures Info: Not present    Additional Factors Info  Code Status, Allergies, Insulin  Sliding Scale Code Status Info: DNR limited Allergies Info: NKA   Insulin  Sliding Scale Info: See discharge summary       Current Medications (08/16/2024):  This is the current hospital active medication list Current Facility-Administered Medications  Medication Dose Route Frequency Provider Last Rate Last Admin   acetaminophen  (TYLENOL ) tablet 650 mg  650 mg Oral Q4H PRN Dunn, Dayna N, PA-C   650 mg at 08/12/24 0336   alum & mag hydroxide-simeth (MAALOX/MYLANTA) 200-200-20 MG/5ML suspension 30 mL  30 mL Oral Q4H PRN Krishnan, Gokul, MD       aspirin EC tablet 81 mg  81 mg Oral Daily Mallipeddi, Vishnu P, MD   81 mg at 08/16/24 0826   atorvastatin (LIPITOR) tablet 80 mg  80 mg Oral Daily  Mallipeddi, Vishnu P, MD   80 mg at 08/16/24 9047   Chlorhexidine  Gluconate Cloth 2 % PADS 6 each  6 each Topical Daily Mallipeddi, Vishnu P, MD   6 each at 08/16/24 0954   clopidogrel (PLAVIX) tablet 75 mg  75 mg Oral Daily Stoner, Benjamin J, MD   75 mg at 08/16/24 9047   gabapentin  (NEURONTIN ) capsule 300 mg  300 mg Oral BID Stoner, Benjamin J, MD   300 mg at 08/16/24 9047   Gerhardt's butt cream   Topical Daily PRN Stoner,  Benjamin J, MD   1 Application at 08/09/24 2141   guaiFENesin (MUCINEX) 12 hr tablet 600 mg  600 mg Oral BID Stoner, Benjamin J, MD   600 mg at 08/16/24 9047   heparin injection 5,000 Units  5,000 Units Subcutaneous Q8H Zenaida Morene PARAS, MD   5,000 Units at 08/16/24 9390   insulin  aspart (novoLOG ) injection 0-15 Units  0-15 Units Subcutaneous TID WC Stoner, Benjamin J, MD   5 Units at 08/16/24 9288   insulin  aspart (novoLOG ) injection 0-5 Units  0-5 Units Subcutaneous QHS Zenaida Morene PARAS, MD   2 Units at 08/15/24 2113   insulin  glargine-yfgn [facility]) injection 7 Units  7 Units Subcutaneous Daily McLean, Dalton S, MD   7 Units at 08/16/24 9047   levothyroxine  (SYNTHROID ) tablet 75 mcg  75 mcg Oral Q0600 Stoner, Benjamin J, MD   75 mcg at 08/16/24 0609   midodrine (PROAMATINE) tablet 10 mg  10 mg Oral TID WC Stoner, Benjamin J, MD   10 mg at 08/16/24 9173   ondansetron  (ZOFRAN ) injection 4 mg  4 mg Intravenous Q6H PRN Dunn, Dayna N, PA-C   4 mg at 08/08/24 1749   Oral care mouth rinse  15 mL Mouth Rinse PRN Mallipeddi, Vishnu P, MD       oxyCODONE  (Oxy IR/ROXICODONE ) immediate release tablet 5 mg  5 mg Oral Q8H PRN Orlando Jerrell LABOR, MD   5 mg at 08/10/24 1121   pantoprazole (PROTONIX) EC tablet 40 mg  40 mg Oral Daily Dunn, Dayna N, PA-C   40 mg at 08/16/24 0952   polyethylene glycol (MIRALAX / GLYCOLAX) packet 17 g  17 g Oral Daily PRN Salah, Husam M, MD       NOREEN ON 08/17/2024] sodium zirconium cyclosilicate (LOKELMA) packet 5 g  5 g Oral Q M,W,F Krishnan, Gokul, MD       tamsulosin  (FLOMAX ) capsule 0.4 mg  0.4 mg Oral Daily Stoner, Benjamin J, MD   0.4 mg at 08/16/24 9047     Discharge Medications: Please see discharge summary for a list of discharge medications.  Relevant Imaging Results:  Relevant Lab Results:   Additional Information SSN [id number]  Luise JAYSON Pan, LCSWA

## 2024-08-16 NOTE — Progress Notes (Addendum)
 Progress Note  Patient Name: William Wells Date of Encounter: 08/16/2024 [facility] Cardiologist: Vishnu P Mallipeddi, MD   Interval Summary   Patient's daughter who lives in [place]  is accompanied with him in the room.  Reportedly had an episode of 2 out of 10 chest pain.  EKG with no acute ST-T wave changes.  Primary team had checked a troponin which was downtrending from prior.  Otherwise he is sleeping and visibly comfortable.  Remains to be in 2 1 heart block.  Daughter had multiple questions about current plan and disposition.  Reports feeling stressed with everything going on.  Vital Signs Vitals:   08/16/24 0546 08/16/24 0814 08/16/24 1021 08/16/24 1108  BP: (!) 113/97 (!) 126/49  116/60  Pulse: (!) 58  84 60  Resp: 18   16  Temp: (!) 97.3 F (36.3 C)   98.6 F (37 C)  TempSrc: Oral   Oral  SpO2: 99% 100% 97% 99%  Weight: 79.1 kg     Height: 5' 8 (1.727 m)       Intake/Output Summary (Last 24 hours) at 08/16/2024 1133 Last data filed at 08/16/2024 1000 Gross per 24 hour  Intake 258.48 ml  Output 2230 ml  Net -1971.52 ml      08/16/2024    5:46 AM 08/15/2024    5:26 AM 08/15/2024    4:32 AM  Last 3 Weights  Weight (lbs) 174 lb 6.1 oz 174 lb 6.1 oz 176 lb 2.4 oz  Weight (kg) 79.1 kg 79.1 kg 79.9 kg      Telemetry/ECG  2 1 heart block heart rate fluctuating between 40-60 but with ambulation comes up to 60s.- Personally Reviewed  Physical Exam  GEN: No acute distress.   Neck: No JVD Cardiac: Irregular rhythm. Respiratory: Clear to auscultation bilaterally. GI: Soft, nontender, non-distended  MS: No edema  Patient Profile Patient with past medical history significant for CKD, hypertension, lung cancer status post lobectomy.  Patient currently here for cardiogenic shock in the setting of inferior MI with evidence of 2 1 heart block and AKI.  Currently trying to find disposition for patient.  Assessment & Plan   Inferior MI Suspected to be  missed MI with no actionable STEMI on EKG.  Troponins had peaked at around 22,000+ but now downtrending.  Given advanced age and AKI medical management was preferred.  Echocardiogram demonstrating preserved EF 50 to 55% with inferior akinesis and normal RV.  PASP 57. Continue with aspirin and Plavix, atorvastatin 80 mg daily.  Completed IV heparin.  Cardiogenic shock Resolved.  Off vasopressors with downtrending creatinine.  Looks euvolemic.   Still requiring midodrine 10 mg 3 times daily.  Heart block Likely from RCA infarct.  Initially had complete heart block.  Now he is in 2-1 heart block with heart rates generally between 40-60.  Evaluated by electrophysiology with no plans for PPM and repeat stage. Avoid AV nodal blocking agents.  Needs to follow-up with EP to reassess patient after repeat stage if he would be a candidate for PPM.  Daughter reports prior admission he was very functional essentially independent. Could consider heart monitor at discharge to evaluate any chronotropic incompetence and extent of conduction disease.  Needs to ambulate with PT/OT here.  AKI on CKD He will follow-up with nephrology.  Creatinine improving.    For questions or updates, please contact [facility] Please consult [email or web address] for contact info under       Signed, Thom LITTIE Sluder, PA-C  William Wells was seen by me today along with Thom Sluder, PA-C. I have personally performed an evaluation on this patient.  My findings are as follows: 89 y.o. male with history of CKD, HTN, lung cancer s/p lobectomy admitted with complete heart block and missed STEMI. He was not felt to be a candidate for cardiac cath given CKD and advanced age. No cardiac cath has been performed. He was managed by the Advanced Heart Failure team over the past week and has been followed by our EP team and Nephrology. No plans for a pacemaker at this time.   Data: EKG(s) and pertinent labs, studies, etc were  personally reviewed and interpreted by me:  Tele reviewed by me: 2:1 AV block, rate 50s.  Labs reviewed by me Otherwise, I agree with data as outlined by the advanced practice provider.  Exam performed by me: Gen: NAD Neck: No JVD Cardiac: [place], irregular Lungs: clear bilaterally Extremities: no LE edema  My Assessment and Plan:  Inferior MI: Late presenting MI with cardiogenic shock. Shock has now resolved. No plans for cardiac cath. He is not a good candidate for cath given advanced age and acute on chronic renal failure. He has overall preserved LV systolic function by echo.  No chest pain suggestive of angina.  Continue ASAS, statin and Plavix.     Complete heart block: 2:1 AV block today. He has been followed by our EP team for 2:1 AV block over the last week. Rates remain in the 50s.  No plans for a pacemaker.  Avoid AV nodal blocking agents.  Assess HR with ambulation this week.     Acute kidney injury on CKD stage III: Nephrology has signed off   Will need rehab placement  Signed,  [name], MD  08/16/2024 12:08 PM

## 2024-08-16 NOTE — Progress Notes (Signed)
 Physical Therapy Treatment Patient Details Name: William Wells MRN: [id number] DOB: 1933/10/31 Today's Date: 08/16/2024   History of Present Illness 89 y.o M adm 08/05/24 with weakness, chest pain, fall, missed MI, CHB, AKI. PMH: arthritis, T2DM, HTN, hypothyroidism, lung CA s/p Lt upper lobectomy, CKD, HLD    PT Comments  Pt states recent chest pain but currently pain free. SPO2 97-99% on RA throughout session with HR 52 at rest and up to 84 with activity. Pt continues to struggle with bowel incontinence and urgency with initial mobility but able to progress to ambulation with chair follow this session. Pt educated for seated HEP and very motivated to return to independent function with supportive family. Encouraged OOB to toilet and chair throughout the day and will continue to progress gait. Patient will benefit from intensive inpatient follow-up therapy, >3 hours/day     If plan is discharge home, recommend the following: A lot of help with bathing/dressing/bathroom;Assistance with cooking/housework;Assist for transportation;Help with stairs or ramp for entrance;A little help with walking and/or transfers   Can travel by private vehicle        Equipment Recommendations  Rollator (4 wheels)    Recommendations for Other Services       Precautions / Restrictions Precautions Precautions: Fall Recall of Precautions/Restrictions: Intact Precaution/Restrictions Comments: watch O2/HR, incontinent BM     Mobility  Bed Mobility Overal bed mobility: Needs Assistance Bed Mobility: Supine to Sit     Supine to sit: Supervision, HOB elevated, Used rails     General bed mobility comments: HOB 20 degrees, use of rail and increased time, assist for lines    Transfers Overall transfer level: Needs assistance   Transfers: Sit to/from Stand, Bed to chair/wheelchair/BSC Sit to Stand: Min assist Stand pivot transfers: Min assist         General transfer comment: min assist to rise  from bed, BSC, recliner, cues for hand placement and safety. pt with urgent pivot bed to [facility] due to incontinent stool. step pivot BSC to recliner with min assist and cues for sequence    Ambulation/Gait Ambulation/Gait assistance: Min assist, +2 safety/equipment Gait Distance (Feet): 60 Feet Assistive device: Rolling walker (2 wheels) Gait Pattern/deviations: Trunk flexed, Step-through pattern   Gait velocity interpretation: <1.8 ft/sec, indicate of risk for recurrent falls   General Gait Details: cues for posture, proximity to RW and safety, pt self-regulating distance with daughter following with chair. Would benefit from rollator trial next session   Stairs             Wheelchair Mobility     Tilt Bed    Modified Rankin (Stroke Patients Only)       Balance Overall balance assessment: Needs assistance Sitting-balance support: No upper extremity supported, Feet supported Sitting balance-Leahy Scale: Fair     Standing balance support: Bilateral upper extremity supported, During functional activity, Reliant on assistive device for balance Standing balance-Leahy Scale: Poor Standing balance comment: tremor, reliant on RW for standing                            Communication Communication Communication: No apparent difficulties  Cognition Arousal: Alert Behavior During Therapy: WFL for tasks assessed/performed   PT - Cognitive impairments: No apparent impairments                         Following commands: Intact      Cueing Cueing Techniques: Verbal  cues  Exercises General Exercises - Lower Extremity Long Arc Quad: AROM, Both, 10 reps, Seated, Strengthening Hip ABduction/ADduction: AROM, Both, 10 reps, Seated, Strengthening    General Comments        Pertinent Vitals/Pain Pain Assessment Pain Assessment: No/denies pain    Home Living                          Prior Function            PT Goals (current goals can  now be found in the care plan section) Progress towards PT goals: Progressing toward goals    Frequency    Min 2X/week      PT Plan      Co-evaluation              AM-PAC PT 6 Clicks Mobility   Outcome Measure  Help needed turning from your back to your side while in a flat bed without using bedrails?: A Little Help needed moving from lying on your back to sitting on the side of a flat bed without using bedrails?: A Little Help needed moving to and from a bed to a chair (including a wheelchair)?: A Little Help needed standing up from a chair using your arms (e.g., wheelchair or bedside chair)?: A Little Help needed to walk in hospital room?: A Lot Help needed climbing 3-5 steps with a railing? : Total 6 Click Score: 15    End of Session Equipment Utilized During Treatment: Gait belt Activity Tolerance: Patient tolerated treatment well Patient left: in chair;with call bell/phone within reach;with family/visitor present Nurse Communication: Mobility status PT Visit Diagnosis: Muscle weakness (generalized) (M62.81);Unsteadiness on feet (R26.81);Other abnormalities of gait and mobility (R26.89);Difficulty in walking, not elsewhere classified (R26.2)     Time: 9147-9075 PT Time Calculation (min) (ACUTE ONLY): 32 min  Charges:    $Gait Training: 8-22 mins $Therapeutic Activity: 8-22 mins PT General Charges $$ ACUTE PT VISIT: 1 Visit                     Lenoard SQUIBB, PT Acute Rehabilitation Services Office: [phone]    Lenoard NOVAK Kordel Leavy 08/16/2024, 10:35 AM

## 2024-08-17 ENCOUNTER — Telehealth: Payer: Self-pay | Admitting: Cardiology

## 2024-08-17 DIAGNOSIS — I441 Atrioventricular block, second degree: Secondary | ICD-10-CM | POA: Diagnosis not present

## 2024-08-17 DIAGNOSIS — R570 Cardiogenic shock: Secondary | ICD-10-CM | POA: Diagnosis not present

## 2024-08-17 DIAGNOSIS — I2119 ST elevation (STEMI) myocardial infarction involving other coronary artery of inferior wall: Secondary | ICD-10-CM | POA: Diagnosis not present

## 2024-08-17 LAB — BASIC METABOLIC PANEL WITH GFR
Anion gap: 11 (ref 5–15)
BUN: 46 mg/dL — ABNORMAL HIGH (ref 8–23)
CO2: 23 mmol/L (ref 22–32)
Calcium: 9.2 mg/dL (ref 8.9–10.3)
Chloride: 104 mmol/L (ref 98–111)
Creatinine, Ser: 2.37 mg/dL — ABNORMAL HIGH (ref 0.61–1.24)
GFR, Estimated: 26 mL/min — ABNORMAL LOW (ref >60)
Glucose, Bld: 151 mg/dL — ABNORMAL HIGH (ref 70–99)
Potassium: 5.1 mmol/L (ref 3.5–5.1)
Sodium: 138 mmol/L (ref 135–145)

## 2024-08-17 LAB — GLUCOSE, CAPILLARY
Glucose-Capillary: 125 mg/dL — ABNORMAL HIGH (ref 70–99)
Glucose-Capillary: 153 mg/dL — ABNORMAL HIGH (ref 70–99)
Glucose-Capillary: 239 mg/dL — ABNORMAL HIGH (ref 70–99)
Glucose-Capillary: 202 mg/dL — ABNORMAL HIGH (ref 70–99)

## 2024-08-17 MED ORDER — SODIUM CHLORIDE 0.9% FLUSH
3.0000 mL | Freq: Two times a day (BID) | INTRAVENOUS | Status: DC
  Administered 2024-08-17 – 2024-08-23 (×11): 3 mL via INTRAVENOUS

## 2024-08-17 MED ORDER — CHLORHEXIDINE GLUCONATE 4 % EX SOLN
60.0000 mL | Freq: Once | CUTANEOUS | Status: AC
  Administered 2024-08-17: 4 via TOPICAL
  Filled 2024-08-17: qty 45

## 2024-08-17 MED ORDER — SODIUM CHLORIDE 0.9 % IV SOLN
80.0000 mg | INTRAVENOUS | Status: AC
  Filled 2024-08-17: qty 2

## 2024-08-17 MED ORDER — SODIUM CHLORIDE 0.9 % IV SOLN: INTRAVENOUS | Status: DC

## 2024-08-17 MED ORDER — MIDODRINE HCL 5 MG PO TABS
5.0000 mg | ORAL_TABLET | Freq: Three times a day (TID) | ORAL | Status: DC
  Administered 2024-08-17: 5 mg via ORAL

## 2024-08-17 MED ORDER — CEFAZOLIN SODIUM-DEXTROSE 2-4 GM/100ML-% IV SOLN
2.0000 g | INTRAVENOUS | Status: AC
Start: 2024-08-18 — End: 2024-08-18
  Filled 2024-08-17 (×2): qty 100

## 2024-08-17 MED ORDER — CHLORHEXIDINE GLUCONATE 4 % EX SOLN
60.0000 mL | Freq: Once | CUTANEOUS | Status: AC
  Administered 2024-08-18: 4 via TOPICAL
  Filled 2024-08-17: qty 15

## 2024-08-17 MED ORDER — NITROGLYCERIN 0.4 MG SL SUBL
SUBLINGUAL_TABLET | SUBLINGUAL | Status: AC
  Filled 2024-08-17: qty 1

## 2024-08-17 MED ORDER — NITROGLYCERIN 0.4 MG SL SUBL
0.4000 mg | SUBLINGUAL_TABLET | SUBLINGUAL | Status: DC | PRN
  Administered 2024-08-17: 0.4 mg via SUBLINGUAL

## 2024-08-17 MED ORDER — SODIUM CHLORIDE 0.9% FLUSH: 3.0000 mL | INTRAVENOUS | Status: DC | PRN

## 2024-08-17 NOTE — Progress Notes (Signed)
 Physical Therapy Treatment Patient Details Name: William Wells MRN: [id number] DOB: July 05, 1934 Today's Date: 08/17/2024   History of Present Illness 89 y.o M adm 08/05/24 with weakness, chest pain, fall, missed MI, CHB, AKI. PMH: arthritis, T2DM, HTN, hypothyroidism, lung CA s/p Lt upper lobectomy, CKD, HLD    PT Comments  Pt pleasant, in chair on arrival stating rough night with 2 periods of chest pain. Pt with urgent BM with standing but able to get to [facility] and tolerating increased gait distance on RA. Pt fatigued after gait and only able to perform 3 additional sit to stands from bed and no further HEP at this time. Encouraged up to chair for meals and continued mobility. Will continue to follow. Patient will benefit from intensive inpatient follow-up therapy, >3 hours/day  HR 46 at rest up to 79 with gait SPO2 98% on RA Pregait 113/61 (77) Post gait 159/51 (81)    If plan is discharge home, recommend the following: A lot of help with bathing/dressing/bathroom;Assistance with cooking/housework;Assist for transportation;Help with stairs or ramp for entrance;A little help with walking and/or transfers   Can travel by private vehicle        Equipment Recommendations  Rollator (4 wheels)    Recommendations for Other Services       Precautions / Restrictions Precautions Precautions: Fall Recall of Precautions/Restrictions: Intact Precaution/Restrictions Comments: watch O2/HR, incontinent BM     Mobility  Bed Mobility Overal bed mobility: Needs Assistance Bed Mobility: Sit to Supine           General bed mobility comments: min assist to clear legs to surface and position in midline in bed    Transfers Overall transfer level: Needs assistance   Transfers: Sit to/from Stand Sit to Stand: Min assist, Contact guard assist           General transfer comment: min assist to rise from recliner and BSc with cues for hand placement. 3 additional standing trials from bed with  CGA.    Ambulation/Gait Ambulation/Gait assistance: Min assist Gait Distance (Feet): 160 Feet Assistive device: Rollator (4 wheels) Gait Pattern/deviations: Trunk flexed, Step-through pattern   Gait velocity interpretation: <1.8 ft/sec, indicate of risk for recurrent falls   General Gait Details: cues for posture, proximity to rollator and safety. pt self-regulating distance with HR 79 and SPO2 98%   Stairs             Wheelchair Mobility     Tilt Bed    Modified Rankin (Stroke Patients Only)       Balance Overall balance assessment: Needs assistance Sitting-balance support: No upper extremity supported, Feet supported Sitting balance-Leahy Scale: Fair     Standing balance support: Bilateral upper extremity supported, During functional activity, Reliant on assistive device for balance Standing balance-Leahy Scale: Poor Standing balance comment: tremor, reliant on rollator for standing                            Communication Communication Communication: No apparent difficulties  Cognition Arousal: Alert Behavior During Therapy: WFL for tasks assessed/performed   PT - Cognitive impairments: No apparent impairments                         Following commands: Intact      Cueing Cueing Techniques: Verbal cues  Exercises      General Comments        Pertinent Vitals/Pain Pain Assessment Pain Assessment:  No/denies pain    Home Living                          Prior Function            PT Goals (current goals can now be found in the care plan section) Progress towards PT goals: Progressing toward goals    Frequency    Min 2X/week      PT Plan      Co-evaluation              AM-PAC PT 6 Clicks Mobility   Outcome Measure  Help needed turning from your back to your side while in a flat bed without using bedrails?: A Little Help needed moving from lying on your back to sitting on the side of a flat  bed without using bedrails?: A Little Help needed moving to and from a bed to a chair (including a wheelchair)?: A Little Help needed standing up from a chair using your arms (e.g., wheelchair or bedside chair)?: A Little Help needed to walk in hospital room?: A Little Help needed climbing 3-5 steps with a railing? : Total 6 Click Score: 16    End of Session Equipment Utilized During Treatment: Gait belt Activity Tolerance: Patient tolerated treatment well Patient left: in bed;with call bell/phone within reach;with family/visitor present Nurse Communication: Mobility status PT Visit Diagnosis: Muscle weakness (generalized) (M62.81);Unsteadiness on feet (R26.81);Other abnormalities of gait and mobility (R26.89);Difficulty in walking, not elsewhere classified (R26.2)     Time: 9043-8974 PT Time Calculation (min) (ACUTE ONLY): 29 min  Charges:    $Gait Training: 8-22 mins $Therapeutic Activity: 8-22 mins PT General Charges $$ ACUTE PT VISIT: 1 Visit                     Lenoard SQUIBB, PT Acute Rehabilitation Services Office: [phone]    Lenoard NOVAK Luna Audia 08/17/2024, 11:42 AM

## 2024-08-17 NOTE — Progress Notes (Addendum)
  Patient Name: William Wells Date of Encounter: 08/17/2024  Primary Cardiologist: Vishnu P Mallipeddi, MD Electrophysiologist: None   Interval Summary   Reports he woke overnight with episode of chest discomfort.   The patient is doing well today.  At this time, the patient denies chest pain, shortness of breath, or any new concerns.  Vital Signs    Vitals:   08/17/24 0812 08/17/24 0900 08/17/24 1136 08/17/24 1209  BP:  113/61 (!) 159/51 116/81  Pulse:   82 (!) 144  Resp:  (!) 24  20  Temp:  98.2 F (36.8 C)  98.1 F (36.7 C)  TempSrc:  Oral  Oral  SpO2:  100% 98% 96%  Weight: 78.9 kg     Height:        Intake/Output Summary (Last 24 hours) at 08/17/2024 1452 Last data filed at 08/17/2024 1300 Gross per 24 hour  Intake 240 ml  Output 850 ml  Net -610 ml   Filed Weights   08/15/24 0526 08/16/24 0546 08/17/24 0812  Weight: 79.1 kg 79.1 kg 78.9 kg    Physical Exam    GEN- ill appearing adult male in NAD, Alert and oriented  Lungs- Clear to ausculation bilaterally, normal work of breathing Cardiac- Regular rate and rhythm, no murmurs, rubs or gallops GI- soft, NT, ND, + BS Extremities- no clubbing or cyanosis. No edema  Telemetry    2:1 AVB 40-70 bpm  (personally reviewed)  Hospital Course    William Wells is a 89 y.o. male with PMH of lung cancer s/p lobectomy, HTN, CKD admitted 08/05/24 for late presenting MI with cardiogenic shock. He was critically ill on presentation. He initially had CHB > which improved to 2:1 AVB.    Assessment & Plan    Initial CHB > 2:1 AVB  In setting of late presenting MI with cardiogenic shock requiring vasopressor support (shock resolved).   -avoid AV nodal blockers  -tele review shows 2:1 AVB with one short episode of high grade AVB with 3:1 vs blocked PAC's lasting 2 cycles of 3:1 then back to 2:1 AVB consistently  -he ambulated with PT and HR resting was 46 bpm > increased to 79 bpm  -note the patient is on plavix    -afebrile, fever curve / WBC defervesced  -admit Cr 3.4 > peaked at 4.20 and now back to 2.37. Baseline ~1.9-2.0 -NPO after MN for planned PPM 08/18/24     For questions or updates, please contact [facility] Please consult [email or web address] for contact info under     Signed, Daphne Barrack, NP-C, AGACNP-BC  [facility] - Electrophysiology  08/17/2024, 3:06 PM ]

## 2024-08-17 NOTE — Telephone Encounter (Signed)
 Called daughter. EP provider is consulting on patient at time of call. No assistance needed

## 2024-08-17 NOTE — Progress Notes (Signed)
 Inpatient Rehab Admissions Coordinator:   Expedited appeal pending. I will follow for potential admit if we win appeal.   Leita Kleine, MS, CCC-SLP Rehab Admissions Coordinator  [phone] (celll) [phone] (office)

## 2024-08-17 NOTE — Progress Notes (Signed)
  Progress Note  Patient Name: William Wells Date of Encounter: 08/17/2024 [facility] Cardiologist: Diannah SHAUNNA Maywood, MD   Interval Summary    NO chest pain or dyspnea  Vital Signs Vitals:   08/17/24 0226 08/17/24 0447 08/17/24 0812 08/17/24 0900  BP: (!) 114/45 (!) 119/55  113/61  Pulse: (!) 33 (!) 34    Resp: 18 17  (!) 24  Temp: 98.3 F (36.8 C) 98.6 F (37 C)  98.2 F (36.8 C)  TempSrc: Oral Oral  Oral  SpO2: 100% 99%  100%  Weight:   78.9 kg   Height:        Intake/Output Summary (Last 24 hours) at 08/17/2024 0941 Last data filed at 08/17/2024 0900 Gross per 24 hour  Intake 360 ml  Output 1650 ml  Net -1290 ml      08/17/2024    8:12 AM 08/16/2024    5:46 AM 08/15/2024    5:26 AM  Last 3 Weights  Weight (lbs) 173 lb 14.4 oz 174 lb 6.1 oz 174 lb 6.1 oz  Weight (kg) 78.881 kg 79.1 kg 79.1 kg      Telemetry/ECG   High grade AV block, ? CHB.- Personally Reviewed  Physical Exam   General: Well developed, well nourished, NAD  SKIN: warm, dry. Neuro: No focal deficits  Psychiatric: Mood and affect normal  Neck: No JVD Lungs:Clear bilaterally, no wheezes, rhonci, crackles Cardiovascular: Regular rate and rhythm. No murmurs, gallops or rubs. Abdomen:Soft.  Extremities: No lower extremity edema.    Patient Profile  89 y.o. male with history of CKD, HTN, lung cancer s/p lobectomy admitted with complete heart block and missed STEMI. He was not felt to be a candidate for cardiac cath given CKD and advanced age. No cardiac cath has been performed. He was managed by the Advanced Heart Failure team over the past week and has been followed by our EP team and Nephrology. No plans for a pacemaker at this time.   My Assessment and Plan:  Inferior MI: Late presenting MI with cardiogenic shock. Shock has now resolved. No plans for cardiac cath. He is not a good candidate for cath given advanced age and acute on chronic renal failure. He has overall  preserved LV systolic function by echo.  No chest pain.  Plan for conservative management of his heart disease Continue ASA. Plavix and statin    Complete heart block: He remains in 2:1 AV block with possible CHB at times. He was seen by Dr. Kennyth over the weekend and no plans were made for a permanent pacemaker. Given his heart rates dipping into the 30s, his family is concerned.  Will ask our EP team to see him today to discuss the potential placement of a permanent pacemaker.   Avoid AV nodal blocking agents.    Awaiting rehab placement  Signed,  [name], MD  08/17/2024 9:41 AM

## 2024-08-17 NOTE — Progress Notes (Addendum)
 [facility]                                                                               William Wells, is a 89 y.o. male, DOB - 1934-04-20, [id number] Admit date - 08/05/2024    Outpatient Primary MD for the patient is Shona Norleen PEDLAR, MD  LOS - 12  days    Brief summary   Brief History:  89 y.o. male with past medical history of lung cancer s/p LUL lobectomy in 06/2020, HTN, HLD, DM2, and GERD who p/w cardiogenic shock in the setting of inferior MI along with complete heart block and acute kidney injury.   Assessment & Plan    Assessment and Plan:  NSTEMI/inferior MI/cardiogenic shock Echocardiogram showing preserved left ventricular ejection fraction with inferior akinesis patient is on aspirin, Plavix, statin and as needed nitroglycerin.  Cardiology on board.  2:1 heart block in the setting of inferior wall MI Cardiology suggest no plan for pacemaker at this time Patient is not on any AV blocking agents.    Acute on chronic stage IIIb CKD Patient follows up with Dr. Rachele in the outpatient setting Baseline creatinine is between 1.9-2.1 He came in with a creatinine of 3.6 peaked it peaked at 3.7 and slowly improving.  No hypotensive episodes at this time.  Patient is not on any ACE inhibitors or ARB's   Hyperkalemia Resolved with Lokelma.    Acute urinary retention Unfortunately failed voiding trial. Patient will be discharged with Foley catheter.  Will need outpatient follow-up with urology for voiding trial Patient was also started on Flomax .    Anemia of chronic disease/normocytic anemia hemoglobin slightly lower than baseline continue to monitor.  Hypothyroidism Continue with Synthroid /levothyroxine .     RN Pressure Injury Documentation: Wound 08/10/24 1212 Pressure Injury Buttocks Right Deep Tissue Pressure Injury - Purple or maroon localized area of discolored intact skin or blood-filled blister due to damage of underlying soft tissue  from pressure and/or shear. (Active)     Wound 08/10/24 1200 Pressure Injury Coccyx Medial Stage 2 -  Partial thickness loss of dermis presenting as a shallow open injury with a red, pink wound bed without slough. (Active)     Estimated body mass index is 26.44 kg/m as calculated from the following:   Height as of this encounter: 5' 8 (1.727 m).   Weight as of this encounter: 78.9 kg.  Code Status: DNR.  DVT Prophylaxis:  heparin injection 5,000 Units Start: 08/11/24 0600    Antimicrobials:   Anti-infectives (From admission, onward)    Start     Dose/Rate Route Frequency Ordered Stop   08/08/24 1645  cefTRIAXone (ROCEPHIN) 2 g in sodium chloride  0.9 % 100 mL IVPB        2 g 200 mL/hr over 30 Minutes Intravenous Every 24 hours 08/08/24 1552 08/13/24 1655        Medications  Scheduled Meds:  aspirin EC  81 mg Oral Daily   atorvastatin  80 mg Oral Daily   Chlorhexidine  Gluconate Cloth  6 each Topical Daily   clopidogrel  75 mg Oral Daily   gabapentin   300 mg Oral BID   guaiFENesin  600 mg Oral BID   heparin injection (subcutaneous)  5,000 Units Subcutaneous Q8H   insulin  aspart  0-15 Units Subcutaneous TID WC   insulin  aspart  0-5 Units Subcutaneous QHS   insulin  glargine-yfgn  7 Units Subcutaneous Daily   levothyroxine   75 mcg Oral Q0600   pantoprazole  40 mg Oral Daily   sodium zirconium cyclosilicate  5 g Oral Q M,W,F   tamsulosin   0.4 mg Oral Daily   Continuous Infusions: PRN Meds:.acetaminophen , alum & mag hydroxide-simeth, Gerhardt's butt cream, nitroGLYCERIN, ondansetron  (ZOFRAN ) IV, mouth rinse, oxyCODONE , polyethylene glycol    Subjective:   William Wells was seen and examined today.  Chest pain earlier this am requiring nitroglycerin.  Patient reports tht he walked 150 feet.   Objective:   Vitals:   08/17/24 0812 08/17/24 0900 08/17/24 1136 08/17/24 1209  BP:  113/61 (!) 159/51 116/81  Pulse:   82 (!) 144  Resp:  (!) 24  20  Temp:  98.2 F (36.8  C)  98.1 F (36.7 C)  TempSrc:  Oral  Oral  SpO2:  100% 98% 96%  Weight: 78.9 kg     Height:        Intake/Output Summary (Last 24 hours) at 08/17/2024 1556 Last data filed at 08/17/2024 1300 Gross per 24 hour  Intake 240 ml  Output --  Net 240 ml   Filed Weights   08/15/24 0526 08/16/24 0546 08/17/24 0812  Weight: 79.1 kg 79.1 kg 78.9 kg     Exam General exam: Appears calm and comfortable  Respiratory system: Clear to auscultation. Respiratory effort normal. Cardiovascular system: S1 & S2 heard, RRR. No JVD,  Gastrointestinal system: Abdomen is nondistended, soft and nontender.  Central nervous system: Alert and oriented.  Extremities: Symmetric 5 x 5 power. Skin: No rashes, Psychiatry: Mood & affect appropriate.    Data Reviewed:  I have personally reviewed following labs and imaging studies   CBC Lab Results  Component Value Date   WBC 9.6 08/16/2024   RBC 3.22 (L) 08/16/2024   HGB 9.6 (L) 08/16/2024   HCT 29.0 (L) 08/16/2024   MCV 90.1 08/16/2024   MCH 29.8 08/16/2024   PLT 298 08/16/2024   MCHC 33.1 08/16/2024   RDW 14.8 08/16/2024   LYMPHSABS 1.5 04/19/2024   MONOABS 0.6 04/19/2024   EOSABS 0.3 04/19/2024   BASOSABS 0.1 04/19/2024     Last metabolic panel Lab Results  Component Value Date   NA 138 08/17/2024   K 5.1 08/17/2024   CL 104 08/17/2024   CO2 23 08/17/2024   BUN 46 (H) 08/17/2024   CREATININE 2.37 (H) 08/17/2024   GLUCOSE 151 (H) 08/17/2024   GFRNONAA 26 (L) 08/17/2024   GFRAA 43 (L) 06/25/2020   CALCIUM  9.2 08/17/2024   PHOS 3.3 08/15/2024   PROT 6.1 (L) 08/11/2024   ALBUMIN  2.0 (L) 08/15/2024   LABGLOB 3.5 12/02/2023   AGRATIO 1.0 12/02/2023   BILITOT 0.6 08/11/2024   ALKPHOS 111 08/11/2024   AST 131 (H) 08/11/2024   ALT 221 (H) 08/11/2024   ANIONGAP 11 08/17/2024    CBG (last 3)  Recent Labs    08/16/24 2117 08/17/24 0619 08/17/24 1206  GLUCAP 338* 125* 153*      Coagulation Profile: No results for input(s):  INR, PROTIME in the last 168 hours.   Radiology Studies: No results found.     Elgie Butter M.D. [facility] 08/17/2024, 3:56 PM  Available via Epic secure chat 7am-7pm After 7 pm,  please refer to night coverage provider listed on amion.

## 2024-08-17 NOTE — Progress Notes (Signed)
 Mobility Specialist Progress Note:    08/17/24 1059  Mobility  Activity Turned to back - supine (Ankle Pumps, Leg lifts, heel Slides)  Level of Assistance Standby assist, set-up cues, supervision of patient - no hands on  Assistive Device None  Range of Motion/Exercises Active Assistive  Activity Response Tolerated fair  Mobility Referral Yes  Mobility visit 1 Mobility  Mobility Specialist Start Time (ACUTE ONLY) 1059  Mobility Specialist Stop Time (ACUTE ONLY) 1123  Mobility Specialist Time Calculation (min) (ACUTE ONLY) 24 min   Received pt laying in bed agreeable to session. No c/o any symptoms. Pt able to perform movements decently w/ assist. Left pt in bed w/ all needs met.   [name] Mobility Specialist Please Neurosurgeon or Rehab Office at [phone]

## 2024-08-17 NOTE — TOC Progression Note (Addendum)
 Transition of Care [facility]) - Progression Note    Patient Details  Name: William Wells MRN: [id number] Date of Birth: 1933-10-31  [facility] CM/SW Contact  Luise JAYSON Pan, [place] Phone Number: 08/17/2024, 10:20 AM  Clinical Narrative:   CSW met patient and family at bedside to provided updated list of snf bed offers. Family asked for CSW to send referral to [facility].  Verneita has FMLA paperwork at bedside and asked if MD could assist. CSW notified MD.  10:46 AM CSW informed family that MD cannot assist and family will have to go to patients PCP to complete FMLA paperwork.   CSW will continue to follow.    Expected Discharge Plan: IP Rehab Facility Barriers to Discharge: Continued Medical Work up               Expected Discharge Plan and Services   Discharge Planning Services: CM Consult   Living arrangements for the past 2 months: Single Family Home                                       Social Drivers of Health (SDOH) Interventions SDOH Screenings   Food Insecurity: No Food Insecurity (08/06/2024)  Housing: Low Risk  (08/06/2024)  Transportation Needs: No Transportation Needs (08/06/2024)  Utilities: Not At Risk (08/06/2024)  Alcohol Screen: Low Risk  (10/25/2020)  Depression (PHQ2-9): Low Risk  (04/19/2024)  Financial Resource Strain: Low Risk  (10/25/2020)  Physical Activity: Sufficiently Active (10/25/2020)  Social Connections: Moderately Isolated (08/06/2024)  Stress: No Stress Concern Present (10/25/2020)  Tobacco Use: Medium Risk (08/06/2024)    Readmission Risk Interventions     No data to display

## 2024-08-17 NOTE — Plan of Care (Signed)
 Patient had challenges sleeping night before, with a chest pain in late evening, and in mid hours of the night a second chest pain, where given nitroglycerin to bring relief. Patient as scheduled pacer placement tomorrow.

## 2024-08-17 NOTE — Telephone Encounter (Signed)
 Daughter is requesting that Dr. Inocencio or another EP dr stop by to see the patient in the hospital today. Please advise

## 2024-08-17 NOTE — Progress Notes (Signed)
 2208: pt is complaining of chest pain as something heavy on it. EKG is done. On-call cardiologist notified. No new orders.  0130: pt brady 32 and a pause of 2.49 and after few minutes brady to 28 with a pause 2.58 pause. On-call cardiologist notified.   0216: Pt C/O CP. Sublingual Nitroglycerin tablet is given. On-call cardiologist notified.  0230: C/P resolved.

## 2024-08-18 ENCOUNTER — Encounter (HOSPITAL_COMMUNITY)
Admission: EM | Disposition: A | Discharge status: Skilled Nursing Facility | Payer: Self-pay | Source: Home / Self Care | Attending: Cardiology

## 2024-08-18 DIAGNOSIS — I2119 ST elevation (STEMI) myocardial infarction involving other coronary artery of inferior wall: Secondary | ICD-10-CM | POA: Diagnosis not present

## 2024-08-18 DIAGNOSIS — I441 Atrioventricular block, second degree: Secondary | ICD-10-CM | POA: Diagnosis not present

## 2024-08-18 DIAGNOSIS — L8990 Pressure ulcer of unspecified site, unspecified stage: Secondary | ICD-10-CM | POA: Insufficient documentation

## 2024-08-18 HISTORY — PX: PACEMAKER IMPLANT: EP1218

## 2024-08-18 LAB — BASIC METABOLIC PANEL WITH GFR
Anion gap: 10 (ref 5–15)
BUN: 39 mg/dL — ABNORMAL HIGH (ref 8–23)
CO2: 24 mmol/L (ref 22–32)
Calcium: 9.3 mg/dL (ref 8.9–10.3)
Chloride: 97 mmol/L — ABNORMAL LOW (ref 98–111)
Creatinine, Ser: 2.34 mg/dL — ABNORMAL HIGH (ref 0.61–1.24)
GFR, Estimated: 26 mL/min — ABNORMAL LOW (ref >60)
Glucose, Bld: 192 mg/dL — ABNORMAL HIGH (ref 70–99)
Potassium: 4.9 mmol/L (ref 3.5–5.1)
Sodium: 131 mmol/L — ABNORMAL LOW (ref 135–145)

## 2024-08-18 LAB — SURGICAL PCR SCREEN
MRSA, PCR: NEGATIVE
Staphylococcus aureus: NEGATIVE

## 2024-08-18 LAB — GLUCOSE, CAPILLARY
Glucose-Capillary: 138 mg/dL — ABNORMAL HIGH (ref 70–99)
Glucose-Capillary: 110 mg/dL — ABNORMAL HIGH (ref 70–99)
Glucose-Capillary: 288 mg/dL — ABNORMAL HIGH (ref 70–99)

## 2024-08-18 MED ORDER — MIDAZOLAM HCL 2 MG/2ML IJ SOLN
INTRAMUSCULAR | Status: AC
  Filled 2024-08-18: qty 2

## 2024-08-18 MED ORDER — FENTANYL CITRATE (PF) 100 MCG/2ML IJ SOLN
INTRAMUSCULAR | Status: AC
  Filled 2024-08-18: qty 2

## 2024-08-18 MED ORDER — HEPARIN (PORCINE) IN NACL 1000-0.9 UT/500ML-% IV SOLN
INTRAVENOUS | Status: DC | PRN
  Administered 2024-08-18: 500 mL

## 2024-08-18 MED ORDER — CEFAZOLIN SODIUM-DEXTROSE 2-4 GM/100ML-% IV SOLN
INTRAVENOUS | Status: AC
  Administered 2024-08-18: 2 g via INTRAVENOUS
  Filled 2024-08-18: qty 100

## 2024-08-18 MED ORDER — LIDOCAINE HCL (PF) 1 % IJ SOLN
INTRAMUSCULAR | Status: DC | PRN
  Administered 2024-08-18: 30 mL

## 2024-08-18 MED ORDER — CLOPIDOGREL BISULFATE 75 MG PO TABS
75.0000 mg | ORAL_TABLET | Freq: Every day | ORAL | Status: DC
  Administered 2024-08-19 – 2024-08-23 (×5): 75 mg via ORAL
  Filled 2024-08-18 (×6): qty 1

## 2024-08-18 MED ORDER — SODIUM CHLORIDE 0.9 % IV SOLN
INTRAVENOUS | Status: AC
  Administered 2024-08-18: 80 mg
  Filled 2024-08-18: qty 2

## 2024-08-18 MED ORDER — CLOPIDOGREL BISULFATE 75 MG PO TABS: 75.0000 mg | ORAL_TABLET | Freq: Every day | ORAL | Status: DC

## 2024-08-18 MED ORDER — LIDOCAINE HCL (PF) 1 % IJ SOLN
INTRAMUSCULAR | Status: AC
Start: 2024-08-18 — End: 2024-08-18
  Filled 2024-08-18: qty 60

## 2024-08-18 NOTE — Progress Notes (Signed)
 [facility]                                                                               William Wells, is a 89 y.o. male, DOB - 1933-12-12, FMW:[id number] Admit date - 08/05/2024    Outpatient Primary MD for the patient is Shona Norleen PEDLAR, MD  LOS - 13  days    Brief summary   Brief History:  89 y.o. male with past medical history of lung cancer s/p LUL lobectomy in 06/2020, HTN, HLD, DM2, and GERD who p/w cardiogenic shock in the setting of inferior MI along with complete heart block and acute kidney injury.   Assessment & Plan    Assessment and Plan:  NSTEMI/inferior MI/cardiogenic shock Echocardiogram showing preserved left ventricular ejection fraction with inferior akinesis patient is on aspirin, Plavix, statin and as needed nitroglycerin.  Cardiology on board.  2:1 heart block in the setting of inferior wall MI Plan for PPM today.  Patient is not on any AV blocking agents.    Acute on chronic stage IIIb CKD Patient follows up with Dr. Rachele in the outpatient setting Baseline creatinine is between 1.9-2.1 He came in with a creatinine of 3.6 peaked it peaked at 3.7 and slowly improving. Creatinine stabilized at 2.3 No hypotensive episodes at this time.  Patient is not on any ACE inhibitors or ARB's   Hyperkalemia Resolved with Lokelma.    Acute urinary retention Unfortunately failed voiding trial. Patient will be discharged with Foley catheter.  Will need outpatient follow-up with urology for voiding trial Patient was also started on Flomax .    Anemia of chronic disease/normocytic anemia hemoglobin slightly lower than baseline continue to monitor.  Hypothyroidism Continue with Synthroid /levothyroxine .   Hyponatremia New. Recheck in am. If still low. Will work up for SIADH.   RN Pressure Injury Documentation: Wound 08/10/24 1212 Pressure Injury Buttocks Right Deep Tissue Pressure Injury - Purple or maroon localized area of discolored intact  skin or blood-filled blister due to damage of underlying soft tissue from pressure and/or shear. (Active)     Wound 08/10/24 1200 Pressure Injury Coccyx Medial Stage 2 -  Partial thickness loss of dermis presenting as a shallow open injury with a red, pink wound bed without slough. (Active)     Estimated body mass index is 26.44 kg/m as calculated from the following:   Height as of this encounter: 5' 8 (1.727 m).   Weight as of this encounter: 78.9 kg.  Code Status: DNR.  DVT Prophylaxis:  heparin injection 5,000 Units Start: 08/11/24 0600    Antimicrobials:   Anti-infectives (From admission, onward)    Start     Dose/Rate Route Frequency Ordered Stop   08/18/24 0800  gentamicin (GARAMYCIN) 80 mg in sodium chloride  0.9 % 500 mL irrigation        80 mg Irrigation On call 08/17/24 2210 08/19/24 0800   08/18/24 0800  ceFAZolin  (ANCEF ) IVPB 2g/100 mL premix        2 g 200 mL/hr over 30 Minutes Intravenous On call 08/17/24 2210 08/19/24 0800   08/08/24 1645  cefTRIAXone (ROCEPHIN) 2 g in sodium chloride  0.9 % 100 mL IVPB  2 g 200 mL/hr over 30 Minutes Intravenous Every 24 hours 08/08/24 1552 08/13/24 1655        Medications  Scheduled Meds:  aspirin EC  81 mg Oral Daily   atorvastatin  80 mg Oral Daily   chlorhexidine   60 mL Topical Once   Chlorhexidine  Gluconate Cloth  6 each Topical Daily   clopidogrel  75 mg Oral Daily   gabapentin   300 mg Oral BID   gentamicin (GARAMYCIN) 80 mg in sodium chloride  0.9 % 500 mL irrigation  80 mg Irrigation On Call   guaiFENesin  600 mg Oral BID   heparin injection (subcutaneous)  5,000 Units Subcutaneous Q8H   insulin  aspart  0-15 Units Subcutaneous TID WC   insulin  aspart  0-5 Units Subcutaneous QHS   insulin  glargine-yfgn  7 Units Subcutaneous Daily   levothyroxine   75 mcg Oral Q0600   pantoprazole  40 mg Oral Daily   sodium chloride  flush  3 mL Intravenous Q12H   sodium zirconium cyclosilicate  5 g Oral Q M,W,F   tamsulosin    0.4 mg Oral Daily   Continuous Infusions:  sodium chloride  50 mL/hr at 08/18/24 9342   sodium chloride       ceFAZolin  (ANCEF ) IV     PRN Meds:.acetaminophen , alum & mag hydroxide-simeth, Gerhardt's butt cream, nitroGLYCERIN, ondansetron  (ZOFRAN ) IV, mouth rinse, oxyCODONE , polyethylene glycol, sodium chloride  flush    Subjective:   William Wells was seen and examined today.  No chest pain overnight. Family concerned that he had hypoxia overnight, will check for nocturnal oxygen levels to see if he needs to go home on oxygen at night.   Objective:   Vitals:   08/17/24 1811 08/17/24 1909 08/18/24 0532 08/18/24 0725  BP: (!) 102/48 (!) 108/45 (!) 128/55 125/61  Pulse: 67 (!) 37 (!) 51 (!) 29  Resp: 19 18 18 20   Temp: 98.1 F (36.7 C) 98.2 F (36.8 C) 98.2 F (36.8 C) 98.1 F (36.7 C)  TempSrc: Oral Oral Oral Oral  SpO2: 96% 97% 99% 97%  Weight:      Height:        Intake/Output Summary (Last 24 hours) at 08/18/2024 1028 Last data filed at 08/18/2024 0917 Gross per 24 hour  Intake 480 ml  Output 3100 ml  Net -2620 ml   Filed Weights   08/15/24 0526 08/16/24 0546 08/17/24 0812  Weight: 79.1 kg 79.1 kg 78.9 kg     Exam General exam: Appears calm and comfortable  Respiratory system: Clear to auscultation. Respiratory effort normal. Cardiovascular system: S1 & S2 heard, bradycardic.  Gastrointestinal system: Abdomen is nondistended, soft and nontender. Central nervous system: Alert and oriented.  Extremities: Symmetric 5 x 5 power. Skin: No rashes,  Psychiatry: Mood & affect appropriate.     Data Reviewed:  I have personally reviewed following labs and imaging studies   CBC Lab Results  Component Value Date   WBC 9.6 08/16/2024   RBC 3.22 (L) 08/16/2024   HGB 9.6 (L) 08/16/2024   HCT 29.0 (L) 08/16/2024   MCV 90.1 08/16/2024   MCH 29.8 08/16/2024   PLT 298 08/16/2024   MCHC 33.1 08/16/2024   RDW 14.8 08/16/2024   LYMPHSABS 1.5 04/19/2024   MONOABS 0.6  04/19/2024   EOSABS 0.3 04/19/2024   BASOSABS 0.1 04/19/2024     Last metabolic panel Lab Results  Component Value Date   NA 131 (L) 08/18/2024   K 4.9 08/18/2024   CL 97 (L) 08/18/2024   CO2  24 08/18/2024   BUN 39 (H) 08/18/2024   CREATININE 2.34 (H) 08/18/2024   GLUCOSE 192 (H) 08/18/2024   GFRNONAA 26 (L) 08/18/2024   GFRAA 43 (L) 06/25/2020   CALCIUM  9.3 08/18/2024   PHOS 3.3 08/15/2024   PROT 6.1 (L) 08/11/2024   ALBUMIN  2.0 (L) 08/15/2024   LABGLOB 3.5 12/02/2023   AGRATIO 1.0 12/02/2023   BILITOT 0.6 08/11/2024   ALKPHOS 111 08/11/2024   AST 131 (H) 08/11/2024   ALT 221 (H) 08/11/2024   ANIONGAP 10 08/18/2024    CBG (last 3)  Recent Labs    08/17/24 1642 08/17/24 2101 08/18/24 0530  GLUCAP 239* 202* 138*      Coagulation Profile: No results for input(s): INR, PROTIME in the last 168 hours.   Radiology Studies: No results found.     Elgie Butter M.D. [facility] 08/18/2024, 10:28 AM  Available via Epic secure chat 7am-7pm After 7 pm, please refer to night coverage provider listed on amion.

## 2024-08-18 NOTE — Progress Notes (Addendum)
 Patient Name: William Wells Date of Encounter: 08/18/2024  Primary Cardiologist: Vishnu P Mallipeddi, MD Electrophysiologist: None  Interval Summary   The patient is doing well today.  At this time, the patient denies chest pain, shortness of breath, or any new concerns.  Vital Signs    Vitals:   08/17/24 1209 08/17/24 1811 08/17/24 1909 08/18/24 0532  BP: 116/81 (!) 102/48 (!) 108/45 (!) 128/55  Pulse: (!) 144 67 (!) 37 (!) 51  Resp: 20 19 18 18   Temp: 98.1 F (36.7 C) 98.1 F (36.7 C) 98.2 F (36.8 C) 98.2 F (36.8 C)  TempSrc: Oral Oral Oral Oral  SpO2: 96% 96% 97% 99%  Weight:      Height:        Intake/Output Summary (Last 24 hours) at 08/18/2024 0747 Last data filed at 08/18/2024 0538 Gross per 24 hour  Intake 480 ml  Output 2700 ml  Net -2220 ml   Filed Weights   08/15/24 0526 08/16/24 0546 08/17/24 0812  Weight: 79.1 kg 79.1 kg 78.9 kg    Physical Exam    GEN- NAD, Alert and oriented  Lungs- Clear to ausculation bilaterally, normal work of breathing Cardiac- Regular rate and rhythm, no murmurs, rubs or gallops GI- soft, NT, ND, + BS Extremities- no clubbing or cyanosis. No edema  Telemetry    2:1 AVB 30-50's (personally reviewed)  Hospital Course    BRADLEY BOSTELMAN is a 89 y.o. male PMH of lung cancer s/p lobectomy, HTN, CKD admitted 08/05/24 for late presenting MI with cardiogenic shock. He was critically ill on presentation. He initially had CHB > which improved to 2:1 AVB.    Assessment & Plan    Initial CHB > 2:1 AVB  1AVB High Degree AVB   In setting of late presenting MI with cardiogenic shock requiring vasopressor support (shock resolved). Difficult to correlate symptoms of chest pain with heart rhythm.   -avoid AV nodal blockers  -note patient on plavix + ASA post MI  -NPO for planned PPM > if necessary, we can add dextrose  to IVF in setting of DM  -risks of procedure reviewed in detail with patient and family at bedside who wish to  proceed with PPM implantation      For questions or updates, please contact [facility] Please consult [email or web address] for contact info under     Signed, Daphne Barrack, NP-C, AGACNP-BC [facility] - Electrophysiology  08/18/2024, 8:46 AM    Lynwood LELON Banks was seen by me today along with Daphne Barrack. I have personally performed an evaluation on this patient.  My findings are as follows: 89 y.o. male no acute distress.  Continues to be fatigued..  Data: EKG(s) and pertinent labs, studies, etc were personally reviewed and interpreted by me:  Telemetry, labs Otherwise, I agree with data as outlined by the advanced practice provider.  Exam performed by me: Gen: No acute distress Neck: No JVD Cardiac: Bradycardic, irregular Lungs: Normal work of breathing Extremities: No edema  My Assessment and Plan:  1.  Second-degree AV block 2.  Inferior MI  Patient continues to have intermittent 2-1 and Mobitz 1 AV block.  He would benefit from pacemaker implant.  Risks and benefits have been discussed.  The patient understands the risks and has agreed to the procedure.  Explained risks, benefits, and alternatives to PPM implantation, including but not limited to bleeding, infection, pneumothorax, pericardial effusion, lead dislodgement, heart attack, stroke, or death.  Pt verbalized understanding and  agrees to proceed.   Signed,  Kelsey Edman Gladis Norton, MD  08/18/2024 9:18 AM

## 2024-08-18 NOTE — TOC Progression Note (Signed)
 Transition of Care [facility]) - Progression Note    Patient Details  Name: William Wells MRN: [id number] Date of Birth: 10-Feb-1934  Transition of [facility] CM/SW Contact  Luise JAYSON Pan, [place] Phone Number: 08/18/2024, 11:32 AM  Clinical Narrative:  CSW informed Verneita that [facility] has accepted patient referral. CSW informed Verneita that CIR is still awaiting appeal determination. Verneita stated the family would really like for patient to go to CIR.   CSW will continue to follow.    Expected Discharge Plan: IP Rehab Facility Barriers to Discharge: Continued Medical Work up               Expected Discharge Plan and Services   Discharge Planning Services: CM Consult   Living arrangements for the past 2 months: Single Family Home                                       Social Drivers of Health (SDOH) Interventions SDOH Screenings   Food Insecurity: No Food Insecurity (08/06/2024)  Housing: Low Risk  (08/06/2024)  Transportation Needs: No Transportation Needs (08/06/2024)  Utilities: Not At Risk (08/06/2024)  Alcohol Screen: Low Risk  (10/25/2020)  Depression (PHQ2-9): Low Risk  (04/19/2024)  Financial Resource Strain: Low Risk  (10/25/2020)  Physical Activity: Sufficiently Active (10/25/2020)  Social Connections: Moderately Isolated (08/06/2024)  Stress: No Stress Concern Present (10/25/2020)  Tobacco Use: Medium Risk (08/06/2024)    Readmission Risk Interventions     No data to display

## 2024-08-18 NOTE — Progress Notes (Addendum)
 Progress Note  Patient Name: William Wells Date of Encounter: 08/18/2024 [facility] Cardiologist: Vishnu P Mallipeddi, MD   Interval Summary    Sitting up in the chair. Family at the bedside.   Vital Signs Vitals:   08/17/24 1811 08/17/24 1909 08/18/24 0532 08/18/24 0725  BP: (!) 102/48 (!) 108/45 (!) 128/55 125/61  Pulse: 67 (!) 37 (!) 51 (!) 29  Resp: 19 18 18 20   Temp: 98.1 F (36.7 C) 98.2 F (36.8 C) 98.2 F (36.8 C) 98.1 F (36.7 C)  TempSrc: Oral Oral Oral Oral  SpO2: 96% 97% 99% 97%  Weight:      Height:        Intake/Output Summary (Last 24 hours) at 08/18/2024 0927 Last data filed at 08/18/2024 9082 Gross per 24 hour  Intake 480 ml  Output 2700 ml  Net -2220 ml      08/17/2024    8:12 AM 08/16/2024    5:46 AM 08/15/2024    5:26 AM  Last 3 Weights  Weight (lbs) 173 lb 14.4 oz 174 lb 6.1 oz 174 lb 6.1 oz  Weight (kg) 78.881 kg 79.1 kg 79.1 kg      Telemetry/ECG   2:1 AVB, rates 30-50s - Personally Reviewed  Physical Exam  GEN: No acute distress.   Neck: No JVD Cardiac: RRR, no murmurs, rubs, or gallops.  Respiratory: Clear to auscultation bilaterally. GI: Soft, nontender, non-distended  MS: No edema  Assessment & Plan   89 y.o. male with a PMH of CKD, HTN, lung cancer s/p lobectomy who presented with chest pain and complete heart block.    Inferior MI -- per notes, felt to be possible missed MI, no actionable STEMI noted on EKG.  This was discussed on admission but he was managed medically given his worsening CKD and age. -- High-sensitivity troponin peaked at 224[address] -- Treated with heparin for 48 hours, aspirin, Plavix, atorvastatin 80 mg daily. Continue medical management. -- Echo 11/1 LVEF of 50-55%, g1DD, normal RV, moderately elevated PASP 56.31mmHg    Complete Heart Block -- in the setting of inferior MI, initially CHB, progressed to 2:1 AVB -- remains in 2:1 AVB, shock resolved. Has episode of chest pain with rates in the  20s evening of 11/11 -- seen again by EP yesterday, plans for PPM today    AKI on CKD stage III Acute urinary retention  -- Baseline creatinine of 1.8 -- Creatinine peaked at 4.2, now down-trending 2.37  in the setting of heart block and inferior MI, as well as obstruction -- s/p foley removal 11/4, replaced 11/5. On flomax . Net - 17.4L -- nephrology signed off   Hypotension Cardiogenic shock -- as above in the setting of MI -- initially required pressors and weaned  -- midodrine stopped yesterday, stable BPs -- GDMT: limited with soft BPs and renal function    Insurance initially declined CIR, appealed by family.    For questions or updates, please contact [facility] Please consult [email or web address] for contact info under   Signed, Manuelita Rummer, NP   Lynwood LELON Banks was seen by me today along with Manuelita Rummer, NP. I have personally performed an evaluation on this patient.  My findings are as follows:  89 y.o. male with history of CKD, HTN, lung cancer s/p lobectomy admitted with complete heart block and missed STEMI. He was not felt to be a candidate for cardiac cath given CKD and advanced age. No cardiac cath has been performed. He was  managed by the Advanced Heart Failure team over the past week and has been followed by our EP team and Nephrology. He has continued to have high grade AV block and planning is underway for a permanent pacemaker today  Data: EKG(s) and pertinent labs, studies, etc were personally reviewed and interpreted by me:  No EKG today I have personally reviewed the telemetry: 2:1 AV block, heart rate 50s I have personally reviewed the lab data Otherwise, I agree with data as outlined by the advanced practice provider.  Exam performed by me:  Gen: NAD Neck: No JVD Cardiac: [place] without a murmur Lungs: clear bilaterally Extremities: no LE edema  My Assessment and Plan:  Inferior MI: Late presenting MI with cardiogenic shock. Shock has  now resolved. No plans for cardiac cath. He is not a good candidate for cath given advanced age and acute on chronic renal failure. He has overall preserved LV systolic function by echo.  No chest pain today.  Continue conservative management of presumed CAD.  Continue ASA, Plavix and statin.    High grade AV block: He remains in high grade AV block. Appreciate input of EP team. Will plan permanent pacemaker today.    Signed,  [name], MD  08/18/2024 10:26 AM

## 2024-08-18 NOTE — Progress Notes (Signed)
 Inpatient Rehab Admissions Coordinator:    I continue to await auth for CIR. No word yet on appeal.   Leita Kleine, MS, CCC-SLP Rehab Admissions Coordinator  [phone] (celll) [phone] (office)

## 2024-08-18 NOTE — Progress Notes (Signed)
 Occupational Therapy Treatment Patient Details Name: William Wells MRN: [id number] DOB: 05/18/1934 Today's Date: 08/18/2024   History of present illness 89 y.o M adm 08/05/24 with weakness, chest pain, fall, missed MI, CHB, AKI. PMH: arthritis, T2DM, HTN, hypothyroidism, lung CA s/p Lt upper lobectomy, CKD, HLD   OT comments  Pt progressing toward goals this session, overall needs CGA for UB ADL and toilet transfer. Pt supervision for  bed mobility. Spo2 with inconsistent pleth, in low 90s with mobility when reading well. Began education on PPM precautions as pt with plan for PPM placement this PM. Pt presenting with impairments listed below, will follow acutely. Patient will benefit from intensive inpatient follow-up therapy, >3 hours/day to maximize safety/ind with ADL/functional mobility.       If plan is discharge home, recommend the following:  Two people to help with walking and/or transfers;A lot of help with bathing/dressing/bathroom;Direct supervision/assist for medications management;Assist for transportation;Direct supervision/assist for financial management;Supervision due to cognitive status   Equipment Recommendations  Other (comment) (defer)    Recommendations for Other Services Rehab consult    Precautions / Restrictions Precautions Precautions: Fall Recall of Precautions/Restrictions: Intact Precaution/Restrictions Comments: watch O2/HR, incontinent BM Restrictions Weight Bearing Restrictions Per Provider Order: No       Mobility Bed Mobility Overal bed mobility: Needs Assistance Bed Mobility: Supine to Sit     Supine to sit: Supervision          Transfers Overall transfer level: Needs assistance Equipment used: Rolling walker (2 wheels) Transfers: Sit to/from Stand Sit to Stand: Contact guard assist                 Balance Overall balance assessment: Needs assistance Sitting-balance support: No upper extremity supported, Feet  supported Sitting balance-Leahy Scale: Fair     Standing balance support: Bilateral upper extremity supported, During functional activity, Reliant on assistive device for balance Standing balance-Leahy Scale: Poor                             ADL either performed or assessed with clinical judgement   ADL Overall ADL's : Needs assistance/impaired                 Upper Body Dressing : Contact guard assist;Sitting       Toilet Transfer: Contact guard assist;Ambulation;Rolling walker (2 wheels);BSC/3in1           Functional mobility during ADLs: Contact guard assist;Rolling walker (2 wheels)      Extremity/Trunk Assessment Upper Extremity Assessment Upper Extremity Assessment: Generalized weakness   Lower Extremity Assessment Lower Extremity Assessment: Defer to PT evaluation        Vision   Vision Assessment?: No apparent visual deficits   Perception Perception Perception: Not tested   Praxis Praxis Praxis: Not tested   Communication Communication Communication: No apparent difficulties   Cognition Arousal: Alert Behavior During Therapy: WFL for tasks assessed/performed Cognition: No apparent impairments                               Following commands: Intact        Cueing   Cueing Techniques: Verbal cues  Exercises Exercises: Other exercises    Shoulder Instructions       General Comments VSS on RA; began discussing PPM precautions as pt going for PPM placement    Pertinent Vitals/ Pain  Pain Assessment Pain Assessment: No/denies pain  Home Living                                          Prior Functioning/Environment              Frequency  Min 2X/week        Progress Toward Goals  OT Goals(current goals can now be found in the care plan section)  Progress towards OT goals: Progressing toward goals  Acute Rehab OT Goals Patient Stated Goal: none stated OT Goal  Formulation: With patient Time For Goal Achievement: 08/24/24 Potential to Achieve Goals: Good ADL Goals Pt Will Perform Upper Body Dressing: sitting;with contact guard assist Pt Will Perform Lower Body Dressing: sitting/lateral leans;sit to/from stand;with contact guard assist Pt Will Transfer to Toilet: with supervision;ambulating Pt Will Perform Tub/Shower Transfer: Shower transfer;with min assist;ambulating;shower seat  Plan      Co-evaluation                 AM-PAC OT 6 Clicks Daily Activity     Outcome Measure   Help from another person eating meals?: None Help from another person taking care of personal grooming?: A Little Help from another person toileting, which includes using toliet, bedpan, or urinal?: A Little Help from another person bathing (including washing, rinsing, drying)?: A Lot Help from another person to put on and taking off regular upper body clothing?: A Little Help from another person to put on and taking off regular lower body clothing?: A Lot 6 Click Score: 17    End of Session Equipment Utilized During Treatment: Gait belt;Rolling walker (2 wheels)  OT Visit Diagnosis: Unsteadiness on feet (R26.81);Other abnormalities of gait and mobility (R26.89);Muscle weakness (generalized) (M62.81)   Activity Tolerance Patient tolerated treatment well   Patient Left in chair;with call bell/phone within reach;with chair alarm set;with family/visitor present   Nurse Communication Mobility status        Time: 9195-9168 OT Time Calculation (min): 27 min  Charges: OT General Charges $OT Visit: 1 Visit OT Treatments $Self Care/Home Management : 8-22 mins $Therapeutic Activity: 8-22 mins  Zia Najera K, OTD, OTR/L SecureChat Preferred Acute Rehab [phone]   Laneta POUR Koonce 08/18/2024, 12:22 PM

## 2024-08-18 NOTE — Progress Notes (Signed)
 Mobility Specialist Progress Note:    08/18/24 1051  Mobility  Activity Turned to back - supine (Ankle Pump, leg lifts, heel slides, hip ab/ad duction)  Level of Assistance Standby assist, set-up cues, supervision of patient - no hands on  Assistive Device None  Range of Motion/Exercises Active  Activity Response Tolerated well  Mobility Referral Yes  Mobility visit 1 Mobility  Mobility Specialist Start Time (ACUTE ONLY) 1051  Mobility Specialist Stop Time (ACUTE ONLY) 1057  Mobility Specialist Time Calculation (min) (ACUTE ONLY) 6 min   Received pt laying in bed agreeable to session. No c/o any symptoms. Pt willing to do bed mobility since ambulating earlier in the morning w/ staff. Pt able to perform movements more efficiently and more reps. Left pt in room w/ family and all needs met.   [name] Mobility Specialist Please Neurosurgeon or Rehab Office at [phone]

## 2024-08-19 ENCOUNTER — Inpatient Hospital Stay (HOSPITAL_COMMUNITY)

## 2024-08-19 ENCOUNTER — Encounter (HOSPITAL_COMMUNITY): Payer: Self-pay | Admitting: Cardiology

## 2024-08-19 DIAGNOSIS — R7989 Other specified abnormal findings of blood chemistry: Secondary | ICD-10-CM | POA: Diagnosis not present

## 2024-08-19 DIAGNOSIS — N179 Acute kidney failure, unspecified: Secondary | ICD-10-CM | POA: Diagnosis not present

## 2024-08-19 LAB — CBC
HCT: 31.2 % — ABNORMAL LOW (ref 39.0–52.0)
Hemoglobin: 10.2 g/dL — ABNORMAL LOW (ref 13.0–17.0)
MCH: 29.9 pg (ref 26.0–34.0)
MCHC: 32.7 g/dL (ref 30.0–36.0)
MCV: 91.5 fL (ref 80.0–100.0)
Platelets: 294 K/uL (ref 150–400)
RBC: 3.41 MIL/uL — ABNORMAL LOW (ref 4.22–5.81)
RDW: 15.3 % (ref 11.5–15.5)
WBC: 9.9 K/uL (ref 4.0–10.5)
nRBC: 0 % (ref 0.0–0.2)

## 2024-08-19 LAB — BASIC METABOLIC PANEL WITH GFR
Anion gap: 11 (ref 5–15)
BUN: 37 mg/dL — ABNORMAL HIGH (ref 8–23)
CO2: 20 mmol/L — ABNORMAL LOW (ref 22–32)
Calcium: 9.2 mg/dL (ref 8.9–10.3)
Chloride: 103 mmol/L (ref 98–111)
Creatinine, Ser: 2.19 mg/dL — ABNORMAL HIGH (ref 0.61–1.24)
GFR, Estimated: 28 mL/min — ABNORMAL LOW (ref >60)
Glucose, Bld: 105 mg/dL — ABNORMAL HIGH (ref 70–99)
Potassium: 5.6 mmol/L — ABNORMAL HIGH (ref 3.5–5.1)
Sodium: 134 mmol/L — ABNORMAL LOW (ref 135–145)

## 2024-08-19 LAB — GLUCOSE, CAPILLARY
Glucose-Capillary: 108 mg/dL — ABNORMAL HIGH (ref 70–99)
Glucose-Capillary: 324 mg/dL — ABNORMAL HIGH (ref 70–99)
Glucose-Capillary: 159 mg/dL — ABNORMAL HIGH (ref 70–99)
Glucose-Capillary: 233 mg/dL — ABNORMAL HIGH (ref 70–99)

## 2024-08-19 MED ORDER — OXYCODONE HCL 5 MG PO TABS
5.0000 mg | ORAL_TABLET | Freq: Once | ORAL | Status: AC
  Administered 2024-08-19: 5 mg via ORAL
  Filled 2024-08-19: qty 1

## 2024-08-19 MED ORDER — SODIUM ZIRCONIUM CYCLOSILICATE 10 G PO PACK
10.0000 g | PACK | Freq: Once | ORAL | Status: AC
  Administered 2024-08-19: 10 g via ORAL
  Filled 2024-08-19: qty 1

## 2024-08-19 NOTE — Progress Notes (Signed)
 Occupational Therapy Treatment Patient Details Name: William Wells MRN: [id number] DOB: 09-06-1934 Today's Date: 08/19/2024   History of present illness 89 y.o M adm 08/05/24 with weakness, chest pain, fall, missed MI, CHB, AKI. 11/13 PPM implant. PMH: arthritis, T2DM, HTN, hypothyroidism, lung CA s/p Lt upper lobectomy, CKD, HLD   OT comments  Pt limited by 10/10 pain in LUE s/p PPM placement. Required mod assist to come EOB, and mod assist for STS with lateral steps. Provided and reviewed PPM precautions with pt and family member. Pt continues to be motivated to make progress, continue to recommend>3 hours of skilled rehab daily. Goals updated, will continue to follow acutely.      If plan is discharge home, recommend the following:  Two people to help with walking and/or transfers;A lot of help with bathing/dressing/bathroom;Direct supervision/assist for medications management;Assist for transportation;Direct supervision/assist for financial management;Supervision due to cognitive status   Equipment Recommendations  Other (comment) (Defer to next venue)    Recommendations for Other Services Rehab consult    Precautions / Restrictions Precautions Precautions: Fall;ICD/Pacemaker Recall of Precautions/Restrictions: Intact Precaution/Restrictions Comments: watch O2/HR, incontinent BM Restrictions Weight Bearing Restrictions Per Provider Order: No       Mobility Bed Mobility Overal bed mobility: Needs Assistance Bed Mobility: Supine to Sit, Sit to Sidelying, Rolling Rolling: Mod assist Sidelying to sit: Mod assist     Sit to sidelying: Mod assist General bed mobility comments: Pt able to walk legs off bed, but required mod assist for trunk. completed rolling to return to bed, assist for legs    Transfers Overall transfer level: Needs assistance Equipment used: Rolling walker (2 wheels) Transfers: Sit to/from Stand Sit to Stand: Mod assist           General transfer  comment: Mod assist to stand, limited by pain     Balance Overall balance assessment: Needs assistance Sitting-balance support: No upper extremity supported, Feet supported Sitting balance-Leahy Scale: Fair     Standing balance support: Bilateral upper extremity supported, During functional activity, Reliant on assistive device for balance Standing balance-Leahy Scale: Poor Standing balance comment: Reliant on RW and external support         ADL either performed or assessed with clinical judgement   ADL Overall ADL's : Needs assistance/impaired     Lower Body Dressing: Moderate assistance;Sit to/from stand   Toilet Transfer: Moderate assistance;Rolling walker (2 wheels)           Functional mobility during ADLs: Moderate assistance;Rolling walker (2 wheels) General ADL Comments: Pt limited by pain in LUE    Extremity/Trunk Assessment Upper Extremity Assessment Upper Extremity Assessment: Generalized weakness   Lower Extremity Assessment Lower Extremity Assessment: Defer to PT evaluation        Vision   Vision Assessment?: No apparent visual deficits         Communication Communication Communication: No apparent difficulties   Cognition Arousal: Alert Behavior During Therapy: WFL for tasks assessed/performed Cognition: No apparent impairments       Following commands: Intact        Cueing   Cueing Techniques: Verbal cues        General Comments VSS on RA    Pertinent Vitals/ Pain       Pain Assessment Pain Assessment: 0-10 Pain Score: 10-Worst pain ever Pain Location: L shoulder Pain Descriptors / Indicators: Moaning, Restless, Sharp, Shooting Pain Intervention(s): Limited activity within patient's tolerance, RN gave pain meds during session   Frequency  Min 2X/week  Progress Toward Goals  OT Goals(current goals can now be found in the care plan section)  Progress towards OT goals: Progressing toward goals  Acute Rehab OT  Goals Patient Stated Goal: To get better OT Goal Formulation: With patient Time For Goal Achievement: 09/02/24 Potential to Achieve Goals: Good ADL Goals Pt Will Perform Upper Body Dressing: sitting;with contact guard assist Pt Will Perform Lower Body Dressing: sitting/lateral leans;sit to/from stand;with contact guard assist Pt Will Transfer to Toilet: with supervision;ambulating Pt Will Perform Tub/Shower Transfer: Shower transfer;with min assist;ambulating;shower seat  Plan         AM-PAC OT 6 Clicks Daily Activity     Outcome Measure   Help from another person eating meals?: None Help from another person taking care of personal grooming?: A Little Help from another person toileting, which includes using toliet, bedpan, or urinal?: A Little Help from another person bathing (including washing, rinsing, drying)?: A Lot Help from another person to put on and taking off regular upper body clothing?: A Little Help from another person to put on and taking off regular lower body clothing?: A Lot 6 Click Score: 17    End of Session Equipment Utilized During Treatment: Rolling walker (2 wheels)  OT Visit Diagnosis: Unsteadiness on feet (R26.81);Other abnormalities of gait and mobility (R26.89);Muscle weakness (generalized) (M62.81)   Activity Tolerance Patient tolerated treatment well   Patient Left in bed;with call bell/phone within reach;with bed alarm set;with family/visitor present   Nurse Communication Mobility status        Time: 8598-8576 OT Time Calculation (min): 22 min  Charges: OT General Charges $OT Visit: 1 Visit OT Treatments $Self Care/Home Management : 8-22 mins  Adrianne BROCKS, OT  Acute Rehabilitation Services Office [phone] Secure chat preferred   Adrianne GORMAN Savers 08/19/2024, 4:06 PM

## 2024-08-19 NOTE — Progress Notes (Signed)
 Inpatient Rehab Admissions Coordinator:  Awaiting insurance decision regarding expedited appeal. Will continue to follow.   Tinnie Yvone Cohens, MS, CCC-SLP Admissions Coordinator [phone]

## 2024-08-19 NOTE — Progress Notes (Signed)
  Patient Name: William Wells Date of Encounter: 08/19/2024  Primary Cardiologist: Vishnu P Mallipeddi, MD Electrophysiologist: None  Interval Summary   The patient is doing well today.  At this time, the patient denies chest pain, shortness of breath, or any new concerns.  Vital Signs    Vitals:   08/18/24 1546 08/18/24 2046 08/19/24 0057 08/19/24 0507  BP: 122/63 (!) 117/52 (!) 120/59 (!) 124/54  Pulse: 74 78  75  Resp: (!) 25 16 18 16   Temp:  97.8 F (36.6 C) 98 F (36.7 C) 98 F (36.7 C)  TempSrc:  Oral Oral Oral  SpO2: 100% 97% 96% 96%  Weight:      Height:        Intake/Output Summary (Last 24 hours) at 08/19/2024 0639 Last data filed at 08/19/2024 0600 Gross per 24 hour  Intake 896.15 ml  Output 2200 ml  Net -1303.85 ml   Filed Weights   08/15/24 0526 08/16/24 0546 08/17/24 0812  Weight: 79.1 kg 79.1 kg 78.9 kg    Physical Exam    GEN- NAD, Alert and oriented  Lungs- Clear to ausculation bilaterally, normal work of breathing Cardiac- Regular rate and rhythm, no murmurs, rubs or gallops GI- soft, NT, ND, + BS Extremities- no clubbing or cyanosis. No edema  Telemetry    VP 70-80's (personally reviewed)  Hospital Course    William Wells is a 89 y.o. male PMH of lung cancer s/p lobectomy, HTN, CKD admitted 08/05/24 for late presenting MI with cardiogenic shock. He was critically ill on presentation. He initially had CHB > which improved to 2:1 AVB.   Assessment & Plan    Initial CHB > 2:1 AVB  1AVB High Degree AVB    In setting of late presenting MI with cardiogenic shock requiring vasopressor support (shock resolved). Difficult to correlate symptoms of chest pain with heart rhythm.   -discharge arm restrictions / teaching completed with patient and family  -written instructions in AVS  -pt has follow up with EP requested for device clinic and 90d visit   -outer dressing to be removed at discharge  CAD  -no a revascularization candidate, late  presenting  -Plavix & ASA per Cardiology        For questions or updates, please contact [facility] Please consult [email or web address] for contact info under     Signed, Daphne Barrack, NP-C, AGACNP-BC [facility] - Electrophysiology  08/19/2024, 6:39 AM

## 2024-08-19 NOTE — Discharge Instructions (Addendum)
 After Your Pacemaker   You have a Medtronic Pacemaker  You have a Medtronic device, plug in your home monitor once you get home, and no manual interaction is required.    If you were set up for monitoring using an app on your phone, make sure the app remains open in the background and the Bluetooth remains on.  ACTIVITY Do not lift your arm above shoulder height for 1 week after your procedure. After 7 days, you may progress as below.  You should remove your sling 24 hours after your procedure, unless otherwise instructed by your provider.     Friday August 26, 2024  Saturday August 27, 2024 Sunday August 28, 2024 Monday August 29, 2024   Do not lift, push, pull, or carry anything over 10 pounds with the affected arm until 6 weeks (Friday September 30, 2024 ) after your procedure.   You may drive AFTER your wound check, unless you have been told otherwise by your provider.   Ask your healthcare provider when you can go back to work   INCISION/Dressing If you are on a blood thinner such as Coumadin, Xarelto, Eliquis, Plavix, or Pradaxa please confirm with your provider when this should be resumed.    If large square, outer bandage is left in place, this can be removed after 24 hours from your procedure. Do not remove steri-strips or glue as below.   If a PRESSURE DRESSING (a bulky dressing that usually goes up over your shoulder) was applied or left in place, please follow instructions given by your provider on when to return to have this removed.   Monitor your Pacemaker site for redness, swelling, and drainage. Call the device clinic at [phone] if you experience these symptoms or fever/chills.  If your incision is sealed with Steri-strips or staples, you may shower 7 days after your procedure or when told by your provider. Do not remove the steri-strips or let the shower hit directly on your site. You may wash around your site with soap and water .    If you were  discharged in a sling, please do not wear this during the day more than 48 hours after your surgery unless otherwise instructed. This may increase the risk of stiffness and soreness in your shoulder.   Avoid lotions, ointments, or perfumes over your incision until it is well-healed.  You may use a hot tub or a pool AFTER your wound check appointment if the incision is completely closed.  Pacemaker Alerts:  Some alerts are vibratory and others beep. These are NOT emergencies. Please call our office to let us  know. If this occurs at night or on weekends, it can wait until the next business day. Send a remote transmission.  If your device is capable of reading fluid status (for heart failure), you will be offered monthly monitoring to review this with you.   DEVICE MANAGEMENT Remote monitoring is used to monitor your pacemaker from home. This monitoring is scheduled every 91 days by our office. It allows us  to keep an eye on the functioning of your device to ensure it is working properly. You will routinely see your Electrophysiologist annually (more often if necessary).  This will appear as a REMOTE check on your MyChart schedule. These are automatic and there is nothing for you to manually do unless otherwise instructed.  You should receive your ID card for your new device in 4-8 weeks. Keep this card with you at all times once received. Consider wearing a  medical alert bracelet or necklace.  Your Pacemaker may be MRI compatible. This will be discussed at your next office visit/wound check.  You should avoid contact with strong electric or magnetic fields.   Do not use amateur (ham) radio equipment or electric (arc) welding torches. MP3 player headphones with magnets should not be used. Some devices are safe to use if held at least 12 inches (30 cm) from your Pacemaker. These include power tools, lawn mowers, and speakers. If you are unsure if something is safe to use, ask your health care  provider.  When using your cell phone, hold it to the ear that is on the opposite side from the Pacemaker. Do not leave your cell phone in a pocket over the Pacemaker.  You may safely use electric blankets, heating pads, computers, and microwave ovens.  Call the office right away if: You have chest pain. You feel more short of breath than you have felt before. You feel more light-headed than you have felt before. Your incision starts to open up.  This information is not intended to replace advice given to you by your health care provider. Make sure you discuss any questions you have with your health care provider.

## 2024-08-19 NOTE — Plan of Care (Signed)
  Problem: Education: Goal: Ability to describe self-care measures that may prevent or decrease complications (Diabetes Survival Skills Education) will improve Outcome: Progressing   Problem: Coping: Goal: Ability to adjust to condition or change in health will improve Outcome: Progressing

## 2024-08-19 NOTE — Progress Notes (Signed)
 [facility]                                                                               William Wells, is a 89 y.o. male, DOB - [birth year], FMW:[id number] Admit date - 08/05/2024    Outpatient Primary MD for the patient is Shona Norleen PEDLAR, MD  LOS - 14  days    Brief summary   Brief History:  89 y.o. male with past medical history of lung cancer s/p LUL lobectomy in 06/2020, HTN, HLD, DM2, and GERD who p/w cardiogenic shock in the setting of inferior MI along with complete heart block and acute kidney injury.   Assessment & Plan    Assessment and Plan:  NSTEMI/inferior MI/cardiogenic shock Echocardiogram showing preserved left ventricular ejection fraction with inferior akinesis patient is on aspirin, Plavix, statin and as needed nitroglycerin.  Cardiology on board.  2:1 heart block in the setting of inferior wall MI Plan for PPM today.  Patient is not on any AV blocking agents.    Acute on chronic stage IIIb CKD Patient follows up with Dr. Rachele in the outpatient setting Baseline creatinine is between 1.9-2.1 He came in with a creatinine of 3.6 peaked it peaked at 3.7 and slowly improving. Creatinine improved to 2.1.  No hypotensive episodes at this time.  Patient is not on any ACE inhibitors or ARB's   Hyperkalemia Lokelma ordered, repeat potassium in the a.m.    Acute urinary retention Unfortunately failed voiding trial. Patient will be discharged with Foley catheter.  Will need outpatient follow-up with urology for voiding trial Patient was also started on Flomax .    Anemia of chronic disease/normocytic anemia  Hemoglobin around 10 and stable  Hypothyroidism Continue with Synthroid /levothyroxine .   Hyponatremia Improved  RN Pressure Injury Documentation: Wound 08/10/24 1212 Pressure Injury Buttocks Right Deep Tissue Pressure Injury - Purple or maroon localized area of discolored intact skin or blood-filled blister due to damage of underlying  soft tissue from pressure and/or shear. (Active)     Wound 08/10/24 1200 Pressure Injury Coccyx Medial Stage 2 -  Partial thickness loss of dermis presenting as a shallow open injury with a red, pink wound bed without slough. (Active)     Estimated body mass index is 26.82 kg/m as calculated from the following:   Height as of this encounter: 5' 8 (1.727 m).   Weight as of this encounter: 80 kg.  Code Status: DNR.  DVT Prophylaxis:  Place and maintain sequential compression device Start: 08/19/24 1008 SCDs Start: 08/18/24 1609    Antimicrobials:   Anti-infectives (From admission, onward)    Start     Dose/Rate Route Frequency Ordered Stop   08/18/24 0800  gentamicin (GARAMYCIN) 80 mg in sodium chloride  0.9 % 500 mL irrigation        80 mg Irrigation On call 08/17/24 2210 08/18/24 1531   08/18/24 0800  ceFAZolin  (ANCEF ) IVPB 2g/100 mL premix        2 g 200 mL/hr over 30 Minutes Intravenous On call 08/17/24 2210 08/18/24 1520   08/08/24 1645  cefTRIAXone (ROCEPHIN) 2 g in sodium chloride  0.9 % 100 mL IVPB  2 g 200 mL/hr over 30 Minutes Intravenous Every 24 hours 08/08/24 1552 08/13/24 1655        Medications  Scheduled Meds:  aspirin EC  81 mg Oral Daily   atorvastatin  80 mg Oral Daily   Chlorhexidine  Gluconate Cloth  6 each Topical Daily   clopidogrel  75 mg Oral Daily   gabapentin   300 mg Oral BID   guaiFENesin  600 mg Oral BID   insulin  aspart  0-15 Units Subcutaneous TID WC   insulin  aspart  0-5 Units Subcutaneous QHS   insulin  glargine-yfgn  7 Units Subcutaneous Daily   levothyroxine   75 mcg Oral Q0600   pantoprazole  40 mg Oral Daily   sodium chloride  flush  3 mL Intravenous Q12H   sodium zirconium cyclosilicate  10 g Oral Once   sodium zirconium cyclosilicate  5 g Oral Q M,W,F   tamsulosin   0.4 mg Oral Daily   Continuous Infusions:   PRN Meds:.acetaminophen , alum & mag hydroxide-simeth, Gerhardt's butt cream, nitroGLYCERIN, ondansetron  (ZOFRAN ) IV,  mouth rinse, oxyCODONE , polyethylene glycol    Subjective:   William Wells was seen and examined today.  Patient reports that he did not sleep well last night. No chest pressure or palpitations at this time  Objective:   Vitals:   08/19/24 0507 08/19/24 0744 08/19/24 1105 08/19/24 1622  BP: (!) 124/54 117/66 (!) 114/52 (!) 118/56  Pulse: 75 74 74 81  Resp: 16 18 18 18   Temp: 98 F (36.7 C) 98.2 F (36.8 C) 98.2 F (36.8 C) 97.7 F (36.5 C)  TempSrc: Oral Oral Oral Oral  SpO2: 96% 96% 97% 93%  Weight:      Height:        Intake/Output Summary (Last 24 hours) at 08/19/2024 1709 Last data filed at 08/19/2024 1625 Gross per 24 hour  Intake 680 ml  Output 2400 ml  Net -1720 ml   Filed Weights   08/16/24 0546 08/17/24 0812 08/19/24 0500  Weight: 79.1 kg 78.9 kg 80 kg     Exam General exam: Appears calm and comfortable  Respiratory system: Clear to auscultation. Respiratory effort normal. Cardiovascular system: S1 & S2 heard, RRR.  Gastrointestinal system: Abdomen is nondistended, soft and nontender.  Central nervous system: Alert and oriented. Extremities: Symmetric 5 x 5 power. Skin: No rashes, Psychiatry:Mood & affect appropriate.      Data Reviewed:  I have personally reviewed following labs and imaging studies   CBC Lab Results  Component Value Date   WBC 9.9 08/19/2024   RBC 3.41 (L) 08/19/2024   HGB 10.2 (L) 08/19/2024   HCT 31.2 (L) 08/19/2024   MCV 91.5 08/19/2024   MCH 29.9 08/19/2024   PLT 294 08/19/2024   MCHC 32.7 08/19/2024   RDW 15.3 08/19/2024   LYMPHSABS 1.5 04/19/2024   MONOABS 0.6 04/19/2024   EOSABS 0.3 04/19/2024   BASOSABS 0.1 04/19/2024     Last metabolic panel Lab Results  Component Value Date   NA 134 (L) 08/19/2024   K 5.6 (H) 08/19/2024   CL 103 08/19/2024   CO2 20 (L) 08/19/2024   BUN 37 (H) 08/19/2024   CREATININE 2.19 (H) 08/19/2024   GLUCOSE 105 (H) 08/19/2024   GFRNONAA 28 (L) 08/19/2024   GFRAA 43 (L)  06/25/2020   CALCIUM  9.2 08/19/2024   PHOS 3.3 08/15/2024   PROT 6.1 (L) 08/11/2024   ALBUMIN  2.0 (L) 08/15/2024   LABGLOB 3.5 12/02/2023   AGRATIO 1.0 12/02/2023   BILITOT 0.6  08/11/2024   ALKPHOS 111 08/11/2024   AST 131 (H) 08/11/2024   ALT 221 (H) 08/11/2024   ANIONGAP 11 08/19/2024    CBG (last 3)  Recent Labs    08/19/24 0635 08/19/24 1104 08/19/24 1624  GLUCAP 108* 324* 159*      Coagulation Profile: No results for input(s): INR, PROTIME in the last 168 hours.   Radiology Studies: DG Chest 2 View Result Date: 08/19/2024 CLINICAL DATA:  730013 Cardiac device in situ, other [id number] EXAM: CHEST - 2 VIEW COMPARISON:  Radiographs 08/05/2024 and 06/18/2021.  CT 08/05/2024. FINDINGS: New left subclavian pacemaker leads project over the right atrium and right ventricle. The heart size and mediastinal contours are stable with aortic atherosclerosis and mediastinal shift to the left. There is stable volume loss in the left hemithorax with perihilar scarring and pleural thickening attributed to previous left upper lobectomy. The right lung appears clear. No evidence of pneumothorax or significant pleural effusion. The bones appear unchanged. IMPRESSION: Interval pacemaker placement as described. No evidence of pneumothorax or other acute cardiopulmonary process. Stable postsurgical changes in the left hemithorax. Electronically Signed   By: Elsie Perone M.D.   On: 08/19/2024 11:36   EP PPM/ICD IMPLANT Result Date: 08/18/2024 SURGEON:  Soyla Norton, MD   PREPROCEDURE DIAGNOSIS:  second degree AV block   POSTPROCEDURE DIAGNOSIS:  second degree AV block    PROCEDURES:  1. Pacemaker implantation.   INTRODUCTION:  MERVILLE HIJAZI is a 89 y.o. male with a history of bradycardia who presents today for pacemaker implantation.  The patient reports intermittent episodes of dizziness over the past few months.  No reversible causes have been identified.  The patient therefore presents today  for pacemaker implantation.   DESCRIPTION OF PROCEDURE:  Informed written consent was obtained, and  the patient was brought to the electrophysiology lab in a fasting state.  The patient required no sedation for the procedure today.  The patients left chest was prepped and draped in the usual sterile fashion by the EP lab staff. The skin overlying the left deltopectoral region was infiltrated with lidocaine  for local analgesia.  A 4-cm incision was made over the left deltopectoral region.  A left subcutaneous pacemaker pocket was fashioned using a combination of sharp and blunt dissection. Electrocautery was required to assure hemostasis.  RA/RV Lead Placement: The left axillary vein was therefore cannulated.  Through the left axillary vein, a Medtronic CapSureFix Novus [id number]  (serial number  [id number]) right atrial lead and an Medtronic SelectSure [id number] (serial number  [id number]) right ventricular lead were advanced with fluoroscopic visualization into the right atrial appendage and left bundle area positions respectively.  Initial atrial lead P- waves measured 2.1 mV with impedance of 625 ohms and a threshold of 0.9 V at 0.5 msec.  Right ventricular lead R-waves measured 8.1 mV with an impedance of 882 ohms and a threshold of 0.9 V at 0.5 msec.  Both leads were secured to the pectoralis fascia using #2-0 silk over the suture sleeves. Device Placement:  The leads were then connected to an Medtronic Azure XT DR [id number]   (serial number  [id number] ) pacemaker.  The pocket was irrigated with copious gentamicin solution.  The pacemaker was then placed into the pocket.  The pocket was then closed in 3 layers with 2.0 and 3.0 V-Loc suture for the subcutaneous layers and 3.0 Vicryl suture for the subcuticular layers.  Steri-  Strips and a sterile dressing were then applied. EBL<58ml.  There were  no early apparent complications.   CONCLUSIONS:  1. Successful implantation of a Medtronic Azure XT DR [id number]  dual-chamber  pacemaker for symptomatic bradycardia  2. No early apparent complications.   Soyla Norton, MD 08/18/2024 3:45 PM      Elgie Butter M.D. [facility] 08/19/2024, 5:09 PM  Available via Epic secure chat 7am-7pm After 7 pm, please refer to night coverage provider listed on amion.

## 2024-08-19 NOTE — Progress Notes (Signed)
 Patient pending CIR admission. Please let cardiology team know when patient being DC from CIR so we can arrange general cariology follow-up.

## 2024-08-19 NOTE — Progress Notes (Signed)
  Progress Note  Patient Name: William Wells Date of Encounter: 08/19/2024 [facility] Cardiologist: Diannah SHAUNNA Maywood, MD   Interval Summary    No chest pain or dyspnea  Vital Signs Vitals:   08/19/24 0057 08/19/24 0500 08/19/24 0507 08/19/24 0744  BP: (!) 120/59  (!) 124/54 117/66  Pulse:   75 74  Resp: 18  16 18   Temp: 98 F (36.7 C)  98 F (36.7 C) 98.2 F (36.8 C)  TempSrc: Oral  Oral Oral  SpO2: 96%  96% 96%  Weight:  80 kg    Height:        Intake/Output Summary (Last 24 hours) at 08/19/2024 1009 Last data filed at 08/19/2024 0600 Gross per 24 hour  Intake 776.15 ml  Output 2250 ml  Net -1473.85 ml      08/19/2024    5:00 AM 08/17/2024    8:12 AM 08/16/2024    5:46 AM  Last 3 Weights  Weight (lbs) 176 lb 5.9 oz 173 lb 14.4 oz 174 lb 6.1 oz  Weight (kg) 80 kg 78.881 kg 79.1 kg      Telemetry/ECG   Tele: Paced - Personally Reviewed ZXH:[id number] reviewed  Physical Exam   General: Well developed, well nourished, NAD  SKIN: warm, dry. Neuro: No focal deficits  Psychiatric: Mood and affect normal  Neck: No JVD Lungs:Clear bilaterally, no wheezes, rhonci, crackles Cardiovascular: Regular rate and rhythm. No murmurs, gallops or rubs. Abdomen:Soft.  Extremities: No lower extremity edema.    Assessment & Plan   89 y.o. male with history of CKD, HTN, lung cancer s/p lobectomy admitted with complete heart block and missed STEMI. He was not felt to be a candidate for cardiac cath given CKD and advanced age. No cardiac cath has been performed. He was managed by the Advanced Heart Failure team over the past week and has been followed by our EP team. He continued to have evidence of low heart rates with high grade AV block. Pacemaker placed 11/13/125.   My Assessment and Plan:  Inferior MI: Late presenting MI with cardiogenic shock. Shock has now resolved.  He is not a good candidate for cath given advanced age and acute on chronic renal  failure. He has overall preserved LV systolic function by echo. No plans for cardiac cath. No chest pain.  Continue conservative management of presumed CAD.  Continue ASA, Plavix and statin.    High grade AV block: He is now post pacemaker placement and doing well.   Awaiting placement for rehab  Signed,  [name], MD  08/19/2024 10:09 AM

## 2024-08-19 NOTE — Progress Notes (Signed)
 PT Cancellation Note  Patient Details Name: William Wells MRN: [id number] DOB: Oct 22, 1933   Cancelled Treatment:    Reason Eval/Treat Not Completed: Patient declined, no reason specified (pt stating intense pain LUE, no sleep and unable to tolerate movement at this time, educated for precautions and benefit of mobility)   Tyronda Vizcarrondo B Keonna Raether 08/19/2024, 7:28 AM Lenoard SQUIBB, PT Acute Rehabilitation Services Office: [phone]

## 2024-08-20 DIAGNOSIS — N179 Acute kidney failure, unspecified: Secondary | ICD-10-CM | POA: Diagnosis not present

## 2024-08-20 DIAGNOSIS — I2119 ST elevation (STEMI) myocardial infarction involving other coronary artery of inferior wall: Secondary | ICD-10-CM | POA: Diagnosis not present

## 2024-08-20 DIAGNOSIS — I441 Atrioventricular block, second degree: Secondary | ICD-10-CM | POA: Diagnosis not present

## 2024-08-20 LAB — BASIC METABOLIC PANEL WITH GFR
Anion gap: 10 (ref 5–15)
BUN: 37 mg/dL — ABNORMAL HIGH (ref 8–23)
CO2: 24 mmol/L (ref 22–32)
Calcium: 9.2 mg/dL (ref 8.9–10.3)
Chloride: 99 mmol/L (ref 98–111)
Creatinine, Ser: 2.32 mg/dL — ABNORMAL HIGH (ref 0.61–1.24)
GFR, Estimated: 26 mL/min — ABNORMAL LOW (ref >60)
Glucose, Bld: 153 mg/dL — ABNORMAL HIGH (ref 70–99)
Potassium: 4.9 mmol/L (ref 3.5–5.1)
Sodium: 133 mmol/L — ABNORMAL LOW (ref 135–145)

## 2024-08-20 LAB — GLUCOSE, CAPILLARY
Glucose-Capillary: 125 mg/dL — ABNORMAL HIGH (ref 70–99)
Glucose-Capillary: 194 mg/dL — ABNORMAL HIGH (ref 70–99)
Glucose-Capillary: 150 mg/dL — ABNORMAL HIGH (ref 70–99)
Glucose-Capillary: 274 mg/dL — ABNORMAL HIGH (ref 70–99)

## 2024-08-20 LAB — CBC
HCT: 30.4 % — ABNORMAL LOW (ref 39.0–52.0)
Hemoglobin: 10.2 g/dL — ABNORMAL LOW (ref 13.0–17.0)
MCH: 30.2 pg (ref 26.0–34.0)
MCHC: 33.6 g/dL (ref 30.0–36.0)
MCV: 89.9 fL (ref 80.0–100.0)
Platelets: 279 K/uL (ref 150–400)
RBC: 3.38 MIL/uL — ABNORMAL LOW (ref 4.22–5.81)
RDW: 15.3 % (ref 11.5–15.5)
WBC: 12.3 K/uL — ABNORMAL HIGH (ref 4.0–10.5)
nRBC: 0 % (ref 0.0–0.2)

## 2024-08-20 MED ORDER — GUAIFENESIN ER 600 MG PO TB12
600.0000 mg | ORAL_TABLET | Freq: Two times a day (BID) | ORAL | Status: DC | PRN
  Administered 2024-08-21 – 2024-08-23 (×2): 600 mg via ORAL
  Filled 2024-08-20 (×2): qty 1

## 2024-08-20 NOTE — Progress Notes (Signed)
 Physical Therapy Treatment Patient Details Name: William Wells MRN: [id number] DOB: 07/14/1934 Today's Date: 08/20/2024   History of Present Illness 89 y.o M adm 08/05/24 with weakness, chest pain, fall, missed MI, CHB, AKI. 11/13 LUE PPM implant. PMH: arthritis, T2DM, HTN, hypothyroidism, lung CA s/p Lt upper lobectomy, CKD, HLD    PT Comments  Pt received in recliner, agreeable to therapy session, plan for seated/standing exercises and gait trial however pt with bowel/urinary urgency (has foley) and needed to sit on [facility] for >5 mins after performing seated exercises. Pt needing heavy minA +2 to light modA for sit<>stand from cushioned chair height when compliant with LUE PPM precs. Handout given to reinforce precs, daughter/pt receptive. Pt with good effort for seated BLE exercises, family requesting PTA return later after pt using toilet to work more on standing/gait trial; PTA agreeable to return if time. Patient will benefit from intensive inpatient follow-up therapy, >3 hours/day.    If plan is discharge home, recommend the following: A lot of help with bathing/dressing/bathroom;Assistance with cooking/housework;Assist for transportation;Help with stairs or ramp for entrance;A little help with walking and/or transfers   Can travel by private vehicle        Equipment Recommendations  Rollator (4 wheels)    Recommendations for Other Services       Precautions / Restrictions Precautions Precautions: Fall;ICD/Pacemaker Recall of Precautions/Restrictions: Intact Precaution/Restrictions Comments: watch O2/HR, incontinent BM helpful to sit on BSC before walking in hallway Restrictions Weight Bearing Restrictions Per Provider Order: No     Mobility  Bed Mobility Overal bed mobility: Needs Assistance             General bed mobility comments: pt received up in chair    Transfers Overall transfer level: Needs assistance Equipment used: Rolling walker (2 wheels) Transfers: Sit  to/from Stand, Bed to chair/wheelchair/BSC Sit to Stand: Min assist, +2 safety/equipment   Step pivot transfers: Min assist, +2 safety/equipment       General transfer comment: from chair>RW, heavy minA to stand initially; daughter standing by for safety on other side. Pivotal steps toward [facility] on his L side, pt requesting >10 mins for BM at end of session so RN notified as PTA needing to find him a gait belt for pt/staff safety to keep in his room.    Ambulation/Gait                   Stairs             Wheelchair Mobility     Tilt Bed    Modified Rankin (Stroke Patients Only)       Balance Overall balance assessment: Needs assistance Sitting-balance support: No upper extremity supported, Feet supported Sitting balance-Leahy Scale: Fair     Standing balance support: Bilateral upper extremity supported, During functional activity, Reliant on assistive device for balance Standing balance-Leahy Scale: Poor Standing balance comment: Reliant on RW and external support, trunk flexed in standing                            Communication Communication Communication: No apparent difficulties  Cognition Arousal: Alert Behavior During Therapy: WFL for tasks assessed/performed   PT - Cognitive impairments: Awareness, Sequencing, Memory                       PT - Cognition Comments: Pt with decreased carryover of safe LUE placement with transfers between sessions, needs mod cues  for technique/plan prior to standing/sitting, some multimodal cues. Following commands: Intact      Cueing Cueing Techniques: Verbal cues, Gestural cues, Tactile cues  Exercises General Exercises - Lower Extremity Ankle Circles/Pumps: AROM, Both, 10 reps, Supine Long Arc Quad: AROM, Both, Seated, Strengthening, 15 reps Hip ABduction/ADduction: AROM, Both, 10 reps, Seated, Strengthening Hip Flexion/Marching: AROM, Both, 10 reps, Seated Other Exercises Other  Exercises: STS x1 for BLE strengthening, then pt needing to use BSC so defer reciprocal reps as previously planned    General Comments General comments (skin integrity, edema, etc.): SpO2 WFL on RA when pt encouraged to maintain upright posture and stick your chest out like a rooster and forward gaze; HR WFL to pivot to BSC; audible gas sounds on BSC, daughter standing by for pt safety while he is on [facility].      Pertinent Vitals/Pain Pain Assessment Pain Assessment: 0-10 Pain Score: 3  Pain Location: L shoulder; also c/o burning/urinary urgency sensation from foley site although catheter appears intact, RN notified Pain Descriptors / Indicators: Sharp, Grimacing, Guarding, Sore, Burning Pain Intervention(s): Limited activity within patient's tolerance, Monitored during session, Repositioned    Home Living                          Prior Function            PT Goals (current goals can now be found in the care plan section) Acute Rehab PT Goals Patient Stated Goal: to go home PT Goal Formulation: With patient Time For Goal Achievement: 08/24/24 Progress towards PT goals: Progressing toward goals    Frequency    Min 2X/week      PT Plan      Co-evaluation              AM-PAC PT 6 Clicks Mobility   Outcome Measure  Help needed turning from your back to your side while in a flat bed without using bedrails?: A Little Help needed moving from lying on your back to sitting on the side of a flat bed without using bedrails?: A Little Help needed moving to and from a bed to a chair (including a wheelchair)?: A Little Help needed standing up from a chair using your arms (e.g., wheelchair or bedside chair)?: A Lot (mod cues needed to initiate) Help needed to walk in hospital room?: A Lot (rec chair follow-safety) Help needed climbing 3-5 steps with a railing? : Total 6 Click Score: 14    End of Session Equipment Utilized During Treatment: Gait belt Activity  Tolerance: Patient tolerated treatment well;Other (comment) (need for [facility]) Patient left: with call bell/phone within reach;with family/visitor present;Other (comment) (on [facility], daughter in room with him for pt safety) Nurse Communication: Mobility status;Other (comment) (urinary urgency/burning sensation around catheter site) PT Visit Diagnosis: Muscle weakness (generalized) (M62.81);Unsteadiness on feet (R26.81);Other abnormalities of gait and mobility (R26.89);Difficulty in walking, not elsewhere classified (R26.2)     Time: 8399-8390 PT Time Calculation (min) (ACUTE ONLY): 9 min  Charges:    $Therapeutic Exercise: 8-22 mins PT General Charges $$ ACUTE PT VISIT: 1 Visit                     Red Mandt P., PTA Acute Rehabilitation Services Secure Chat Preferred 9a-5:30pm Office: [phone]    Connell HERO [facility] 08/20/2024, 5:24 PM

## 2024-08-20 NOTE — Progress Notes (Signed)
 [facility]                                                                               William Wells, is a 89 y.o. male, DOB - 03-31-1934, FMW:[id number] Admit date - 08/05/2024    Outpatient Primary MD for the patient is Shona Norleen PEDLAR, MD  LOS - 15  days    Brief summary   Brief History:  89 y.o. male with past medical history of lung cancer s/p LUL lobectomy in 06/2020, HTN, HLD, DM2, and GERD who p/w cardiogenic shock in the setting of inferior MI along with complete heart block and acute kidney injury.   Assessment & Plan    Assessment and Plan:  NSTEMI/inferior MI/cardiogenic shock Echocardiogram showing preserved left ventricular ejection fraction with inferior akinesis patient is on aspirin, Plavix, statin and as needed nitroglycerin.  Cardiology on board.  2:1 heart block in the setting of inferior wall MI Underwent PPM.  No  new complaints of chest pain.     Acute on chronic stage IIIb CKD Patient follows up with Dr. Rachele in the outpatient setting Baseline creatinine is between 1.9-2.1 He came in with a creatinine of 3.6 peaked it peaked at 3.7 and slowly improving. Creatinine improved to 2.3.  No hypotensive episodes at this time.  Patient is not on any ACE inhibitors or ARB's   Hyperkalemia Resolved.     Acute urinary retention Unfortunately failed voiding trial. Patient will be discharged with Foley catheter.  Will need outpatient follow-up with urology for voiding trial Patient was also started on Flomax .    Anemia of chronic disease/normocytic anemia  Hemoglobin around 10 and stable  Hypothyroidism Continue with Synthroid /levothyroxine .   Hyponatremia Sodium of 133.    Mild leukocytosis Suspect reactive.  Monitor.   RN Pressure Injury Documentation: Wound 08/10/24 1212 Pressure Injury Buttocks Right Deep Tissue Pressure Injury - Purple or maroon localized area of discolored intact skin or blood-filled blister due to damage  of underlying soft tissue from pressure and/or shear. (Active)     Wound 08/10/24 1200 Pressure Injury Coccyx Medial Stage 2 -  Partial thickness loss of dermis presenting as a shallow open injury with a red, pink wound bed without slough. (Active)  Local wound care.    Estimated body mass index is 27.35 kg/m as calculated from the following:   Height as of this encounter: 5' 8 (1.727 m).   Weight as of this encounter: 81.6 kg.  Code Status: DNR.  DVT Prophylaxis:  Place and maintain sequential compression device Start: 08/19/24 1008 SCDs Start: 08/18/24 1609    Antimicrobials:   Anti-infectives (From admission, onward)    Start     Dose/Rate Route Frequency Ordered Stop   08/18/24 0800  gentamicin (GARAMYCIN) 80 mg in sodium chloride  0.9 % 500 mL irrigation        80 mg Irrigation On call 08/17/24 2210 08/18/24 1531   08/18/24 0800  ceFAZolin  (ANCEF ) IVPB 2g/100 mL premix        2 g 200 mL/hr over 30 Minutes Intravenous On call 08/17/24 2210 08/18/24 1520   08/08/24 1645  cefTRIAXone (ROCEPHIN) 2 g in sodium chloride  0.9 %  100 mL IVPB        2 g 200 mL/hr over 30 Minutes Intravenous Every 24 hours 08/08/24 1552 08/13/24 1655        Medications  Scheduled Meds:  aspirin EC  81 mg Oral Daily   atorvastatin  80 mg Oral Daily   Chlorhexidine  Gluconate Cloth  6 each Topical Daily   clopidogrel  75 mg Oral Daily   gabapentin   300 mg Oral BID   insulin  aspart  0-15 Units Subcutaneous TID WC   insulin  aspart  0-5 Units Subcutaneous QHS   insulin  glargine-yfgn  7 Units Subcutaneous Daily   levothyroxine   75 mcg Oral Q0600   pantoprazole  40 mg Oral Daily   sodium chloride  flush  3 mL Intravenous Q12H   sodium zirconium cyclosilicate  5 g Oral Q M,W,F   tamsulosin   0.4 mg Oral Daily   Continuous Infusions:   PRN Meds:.acetaminophen , alum & mag hydroxide-simeth, Gerhardt's butt cream, guaiFENesin, nitroGLYCERIN, ondansetron  (ZOFRAN ) IV, mouth rinse, oxyCODONE ,  polyethylene glycol    Subjective:   William Wells was seen and examined today. No new complaints overnight. No chest pain.   Objective:   Vitals:   08/19/24 2335 08/20/24 0405 08/20/24 0533 08/20/24 0932  BP: (!) 120/53 112/61  121/61  Pulse: 79 81  84  Resp: 17 16  18   Temp: 98.5 F (36.9 C) 98.1 F (36.7 C)  98.1 F (36.7 C)  TempSrc: Oral Oral  Oral  SpO2: 96% 95%  96%  Weight:   81.6 kg   Height:        Intake/Output Summary (Last 24 hours) at 08/20/2024 1534 Last data filed at 08/20/2024 0900 Gross per 24 hour  Intake 440 ml  Output 2600 ml  Net -2160 ml   Filed Weights   08/17/24 0812 08/19/24 0500 08/20/24 0533  Weight: 78.9 kg 80 kg 81.6 kg     Exam General exam: Appears calm and comfortable  Respiratory system: Clear to auscultation. Respiratory effort normal. Cardiovascular system: S1 & S2 heard, RRR.  Gastrointestinal system: Abdomen is nondistended, soft and nontender.  Central nervous system: Alert and oriented.  Extremities: no edema.  Skin: No rashes, Psychiatry:  Mood & affect appropriate.      Data Reviewed:  I have personally reviewed following labs and imaging studies   CBC Lab Results  Component Value Date   WBC 12.3 (H) 08/20/2024   RBC 3.38 (L) 08/20/2024   HGB 10.2 (L) 08/20/2024   HCT 30.4 (L) 08/20/2024   MCV 89.9 08/20/2024   MCH 30.2 08/20/2024   PLT 279 08/20/2024   MCHC 33.6 08/20/2024   RDW 15.3 08/20/2024   LYMPHSABS 1.5 04/19/2024   MONOABS 0.6 04/19/2024   EOSABS 0.3 04/19/2024   BASOSABS 0.1 04/19/2024     Last metabolic panel Lab Results  Component Value Date   NA 133 (L) 08/20/2024   K 4.9 08/20/2024   CL 99 08/20/2024   CO2 24 08/20/2024   BUN 37 (H) 08/20/2024   CREATININE 2.32 (H) 08/20/2024   GLUCOSE 153 (H) 08/20/2024   GFRNONAA 26 (L) 08/20/2024   GFRAA 43 (L) 06/25/2020   CALCIUM  9.2 08/20/2024   PHOS 3.3 08/15/2024   PROT 6.1 (L) 08/11/2024   ALBUMIN  2.0 (L) 08/15/2024   LABGLOB 3.5  12/02/2023   AGRATIO 1.0 12/02/2023   BILITOT 0.6 08/11/2024   ALKPHOS 111 08/11/2024   AST 131 (H) 08/11/2024   ALT 221 (H) 08/11/2024   ANIONGAP 10  08/20/2024    CBG (last 3)  Recent Labs    08/19/24 2126 08/20/24 0618 08/20/24 1055  GLUCAP 233* 125* 194*      Coagulation Profile: No results for input(s): INR, PROTIME in the last 168 hours.   Radiology Studies: DG Chest 2 View Result Date: 08/19/2024 CLINICAL DATA:  730013 Cardiac device in situ, other [id number] EXAM: CHEST - 2 VIEW COMPARISON:  Radiographs 08/05/2024 and 06/18/2021.  CT 08/05/2024. FINDINGS: New left subclavian pacemaker leads project over the right atrium and right ventricle. The heart size and mediastinal contours are stable with aortic atherosclerosis and mediastinal shift to the left. There is stable volume loss in the left hemithorax with perihilar scarring and pleural thickening attributed to previous left upper lobectomy. The right lung appears clear. No evidence of pneumothorax or significant pleural effusion. The bones appear unchanged. IMPRESSION: Interval pacemaker placement as described. No evidence of pneumothorax or other acute cardiopulmonary process. Stable postsurgical changes in the left hemithorax. Electronically Signed   By: Elsie Perone M.D.   On: 08/19/2024 11:36       Elgie Butter M.D. [facility] 08/20/2024, 3:34 PM  Available via Epic secure chat 7am-7pm After 7 pm, please refer to night coverage provider listed on amion.

## 2024-08-20 NOTE — Progress Notes (Signed)
 Physical Therapy Treatment Patient Details Name: William Wells MRN: [id number] DOB: 1934/09/27 Today's Date: 08/20/2024   History of Present Illness 89 y.o M adm 08/05/24 with weakness, chest pain, fall, missed MI, CHB, AKI. 11/13 LUE PPM implant. PMH: arthritis, T2DM, HTN, hypothyroidism, lung CA s/p Lt upper lobectomy, CKD, HLD    PT Comments  Pt received up on [facility], daughter present, RN not able to return to assist pt off BSC, pt did not have BM but did have audible gas. Daughter requesting PTA return to assist him off [facility] [place] for safety since staff busy. Pt also agreeable to gait trial now that he's done on BSC. Pt needing up to minA (+2 safety) to perform gait trial with increased assist needed to manage RW around turns, and close chair follow for safety. Pt needs dense cues for posture, RW proximity, activity pacing and pt noted to be very tremulous in all extremities as he fatigues. HR to 127 bpm with exertion, mostly ~115 bpm, SpO2 WFL on RA. Pt bed in supine at end of session, pillows for LUE support and to float heels, daughter present. RN notified pt c/o burning around foley site. Patient will benefit from intensive inpatient follow-up therapy, >3 hours/day.    If plan is discharge home, recommend the following: A lot of help with bathing/dressing/bathroom;Assistance with cooking/housework;Assist for transportation;Help with stairs or ramp for entrance;A lot of help with walking and/or transfers   Can travel by private vehicle        Equipment Recommendations  Rollator (4 wheels) (pending progress)    Recommendations for Other Services       Precautions / Restrictions Precautions Precautions: Fall;ICD/Pacemaker Recall of Precautions/Restrictions: Intact Precaution/Restrictions Comments: watch O2/HR, incontinent BM helpful to sit on BSC before walking in hallway Restrictions Weight Bearing Restrictions Per Provider Order: No     Mobility  Bed Mobility Overal bed mobility: Needs  Assistance Bed Mobility: Sit to Sidelying         Sit to sidelying: +2 for safety/equipment, Min assist General bed mobility comments: Cues for LUE PPM precs, needs assist to lift/guide LE over EOB, daughter guarding at his R shoulder PRN for pt safety    Transfers Overall transfer level: Needs assistance Equipment used: Rolling walker (2 wheels) Transfers: Sit to/from Stand Sit to Stand: Min assist, +2 safety/equipment, From elevated surface         General transfer comment: from elevated BSC>RW, then RW>EOB, cues for LUE PPM precs and to reach back with RUE each rep.    Ambulation/Gait Ambulation/Gait assistance: Min assist, +2 safety/equipment Gait Distance (Feet): 75 Feet Assistive device: Rolling walker (2 wheels) Gait Pattern/deviations: Trunk flexed, Step-through pattern, Decreased stride length       General Gait Details: cues for posture, proximity to RW and safety. pt needs cues for energy conservation, activity pacing, forward gaze, to relax shoulders. SpO2 93-100% on RA with exertion, HR 90's bpm initially and increased as high as 127 bpm with exertion, but mostly 110-118 bpm as he fatigues. Close chair follow for safety, pt demos increased UE and BLE tremors as he fatigues. Increased assist needed to manage RW as he turns.   Stairs Stairs:  (defer, HR elevated with exertion and pt fatigued)           Wheelchair Mobility     Tilt Bed    Modified Rankin (Stroke Patients Only)       Balance Overall balance assessment: Needs assistance Sitting-balance support: No upper extremity supported, Feet supported  Sitting balance-Leahy Scale: Fair     Standing balance support: Bilateral upper extremity supported, During functional activity, Reliant on assistive device for balance Standing balance-Leahy Scale: Poor Standing balance comment: Reliant on RW and external support, trunk flexed in standing                            Communication  Communication Communication: No apparent difficulties  Cognition Arousal: Alert Behavior During Therapy: WFL for tasks assessed/performed   PT - Cognitive impairments: Awareness, Sequencing, Memory                       PT - Cognition Comments: Pt with decreased carryover of safe LUE placement with transfers between sessions, needs mod cues for technique/plan prior to standing/sitting, some multimodal cues. Following commands: Intact      Cueing Cueing Techniques: Verbal cues, Gestural cues, Tactile cues     General Comments General comments (skin integrity, edema, etc.): SpO2 WFL on RA when pt encouraged to maintain upright posture and stick your chest out like a rooster and forward gaze; HR max 127 bpm, mostly ~115 bpm      Pertinent Vitals/Pain Pain Assessment Pain Assessment: 0-10 Pain Score: 3  Pain Location: L shoulder; also c/o burning/urinary urgency sensation from foley site although catheter appears intact, RN notified Pain Descriptors / Indicators: Sharp, Grimacing, Guarding, Sore, Burning Pain Intervention(s): Limited activity within patient's tolerance, Monitored during session, Repositioned    Home Living                          Prior Function            PT Goals (current goals can now be found in the care plan section) Acute Rehab PT Goals Patient Stated Goal: to go home PT Goal Formulation: With patient Time For Goal Achievement: 08/24/24 Progress towards PT goals: Progressing toward goals    Frequency    Min 2X/week      PT Plan      Co-evaluation              AM-PAC PT 6 Clicks Mobility   Outcome Measure  Help needed turning from your back to your side while in a flat bed without using bedrails?: A Little Help needed moving from lying on your back to sitting on the side of a flat bed without using bedrails?: A Little Help needed moving to and from a bed to a chair (including a wheelchair)?: A Little Help needed  standing up from a chair using your arms (e.g., wheelchair or bedside chair)?: A Lot (mod cues needed to initiate) Help needed to walk in hospital room?: A Lot (rec chair follow-safety) Help needed climbing 3-5 steps with a railing? : Total 6 Click Score: 14    End of Session Equipment Utilized During Treatment: Gait belt Activity Tolerance: Patient tolerated treatment well Patient left: with call bell/phone within reach;with family/visitor present;in bed;with bed alarm set;Other (comment) (heels floated, pillow under L elbow for support, HOB >30 deg) Nurse Communication: Mobility status;Other (comment) (urinary urgency/burning sensation around catheter site) PT Visit Diagnosis: Muscle weakness (generalized) (M62.81);Unsteadiness on feet (R26.81);Other abnormalities of gait and mobility (R26.89);Difficulty in walking, not elsewhere classified (R26.2)     Time: 8377-8363 PT Time Calculation (min) (ACUTE ONLY): 14 min  Charges:    $Gait Training: 8-22 mins PT General Charges $$ ACUTE PT VISIT: 1 Visit  Connell SQUIBB., PTA Acute Rehabilitation Services Secure Chat Preferred 9a-5:30pm Office: [phone]    Connell CHRISTELLA Blue 08/20/2024, 5:33 PM

## 2024-08-20 NOTE — Progress Notes (Signed)
  Progress Note  Patient Name: William Wells Date of Encounter: 08/20/2024 [facility] Cardiologist: Vishnu P Mallipeddi, MD   Interval Summary    Creatinine stable at 2.3.  BP 121/61.  Denies any chest pain or dyspnea  Vital Signs Vitals:   08/19/24 2335 08/20/24 0405 08/20/24 0533 08/20/24 0932  BP: (!) 120/53 112/61  121/61  Pulse: 79 81  84  Resp: 17 16  18   Temp: 98.5 F (36.9 C) 98.1 F (36.7 C)  98.1 F (36.7 C)  TempSrc: Oral Oral  Oral  SpO2: 96% 95%  96%  Weight:   81.6 kg   Height:        Intake/Output Summary (Last 24 hours) at 08/20/2024 0946 Last data filed at 08/20/2024 0900 Gross per 24 hour  Intake 560 ml  Output 2600 ml  Net -2040 ml      08/20/2024    5:33 AM 08/19/2024    5:00 AM 08/17/2024    8:12 AM  Last 3 Weights  Weight (lbs) 179 lb 14.3 oz 176 lb 5.9 oz 173 lb 14.4 oz  Weight (kg) 81.6 kg 80 kg 78.881 kg      Telemetry/ECG   Tele: Atrial sensed, ventricular paced- Personally Reviewed EKG: No new ECG  Physical Exam   General: Well developed, well nourished, NAD  SKIN: warm, dry. Neuro: No focal deficits  Psychiatric: Mood and affect normal  Neck: No JVD Lungs:Clear bilaterally, no wheezes, rhonci, crackles Cardiovascular: Regular rate and rhythm. No murmurs, gallops or rubs. Abdomen:Soft.  Extremities: No lower extremity edema.    Assessment & Plan   89 y.o. male with history of CKD, HTN, lung cancer s/p lobectomy admitted with complete heart block and missed STEMI. He was not felt to be a candidate for cardiac cath given CKD and advanced age. No cardiac cath has been performed. He was managed by the Advanced Heart Failure team over the past week and has been followed by our EP team. He continued to have evidence of low heart rates with high grade AV block. Pacemaker placed 11/13/125.    Inferior MI: Late presenting MI with cardiogenic shock. Shock has now resolved.  He is not a good candidate for cath given advanced  age and acute on chronic renal failure. He has overall preserved LV systolic function by echo. No plans for cardiac cath. No chest pain.  Continue conservative management of presumed CAD.  Continue ASA, Plavix and statin.    High grade AV block: He is now post pacemaker placement and doing well.   AKI on CKD: Presented with creatinine 3.6, has been slowly improving,  2.3 today   Awaiting placement for rehab  Signed,  Lonni LITTIE Nanas, MD  08/20/2024 9:46 AM

## 2024-08-20 NOTE — Plan of Care (Signed)
  Problem: Activity: Goal: Ability to tolerate increased activity will improve 08/20/2024 1810 by Connell Lucrecia SQUIBB, RN Outcome: Progressing 08/20/2024 1810 by Connell Lucrecia SQUIBB, RN Outcome: Progressing   Problem: Activity: Goal: Ability to tolerate increased activity will improve 08/20/2024 1810 by Connell Lucrecia SQUIBB, RN Outcome: Progressing 08/20/2024 1810 by Connell Lucrecia SQUIBB, RN Outcome: Progressing   Problem: Activity: Goal: Ability to tolerate increased activity will improve 08/20/2024 1810 by Connell Lucrecia SQUIBB, RN Outcome: Progressing 08/20/2024 1810 by Connell Lucrecia SQUIBB, RN Outcome: Progressing   Problem: Activity: Goal: Ability to tolerate increased activity will improve 08/20/2024 1810 by Connell Lucrecia SQUIBB, RN Outcome: Progressing 08/20/2024 1810 by Connell Lucrecia SQUIBB, RN Outcome: Progressing   Problem: Activity: Goal: Ability to tolerate increased activity will improve 08/20/2024 1810 by Connell Lucrecia SQUIBB, RN Outcome: Progressing 08/20/2024 1810 by Connell Lucrecia SQUIBB, RN Outcome: Progressing   Problem: Activity: Goal: Ability to tolerate increased activity will improve 08/20/2024 1810 by Connell Lucrecia SQUIBB, RN Outcome: Progressing 08/20/2024 1810 by Connell Lucrecia SQUIBB, RN Outcome: Progressing

## 2024-08-20 NOTE — Plan of Care (Signed)
   Problem: Education: Goal: Ability to describe self-care measures that may prevent or decrease complications (Diabetes Survival Skills Education) will improve Outcome: Progressing   Problem: Coping: Goal: Ability to adjust to condition or change in health will improve Outcome: Progressing   Problem: Fluid Volume: Goal: Ability to maintain a balanced intake and output will improve Outcome: Progressing

## 2024-08-21 DIAGNOSIS — N179 Acute kidney failure, unspecified: Secondary | ICD-10-CM | POA: Diagnosis not present

## 2024-08-21 DIAGNOSIS — I441 Atrioventricular block, second degree: Secondary | ICD-10-CM | POA: Diagnosis not present

## 2024-08-21 DIAGNOSIS — I2119 ST elevation (STEMI) myocardial infarction involving other coronary artery of inferior wall: Secondary | ICD-10-CM | POA: Diagnosis not present

## 2024-08-21 LAB — CBC
HCT: 30.7 % — ABNORMAL LOW (ref 39.0–52.0)
Hemoglobin: 10 g/dL — ABNORMAL LOW (ref 13.0–17.0)
MCH: 29.5 pg (ref 26.0–34.0)
MCHC: 32.6 g/dL (ref 30.0–36.0)
MCV: 90.6 fL (ref 80.0–100.0)
Platelets: 242 K/uL (ref 150–400)
RBC: 3.39 MIL/uL — ABNORMAL LOW (ref 4.22–5.81)
RDW: 15.4 % (ref 11.5–15.5)
WBC: 9.5 K/uL (ref 4.0–10.5)
nRBC: 0 % (ref 0.0–0.2)

## 2024-08-21 LAB — BASIC METABOLIC PANEL WITH GFR
Anion gap: 10 (ref 5–15)
BUN: 37 mg/dL — ABNORMAL HIGH (ref 8–23)
CO2: 24 mmol/L (ref 22–32)
Calcium: 9.3 mg/dL (ref 8.9–10.3)
Chloride: 101 mmol/L (ref 98–111)
Creatinine, Ser: 2.29 mg/dL — ABNORMAL HIGH (ref 0.61–1.24)
GFR, Estimated: 27 mL/min — ABNORMAL LOW (ref >60)
Glucose, Bld: 127 mg/dL — ABNORMAL HIGH (ref 70–99)
Potassium: 4.1 mmol/L (ref 3.5–5.1)
Sodium: 135 mmol/L (ref 135–145)

## 2024-08-21 LAB — GLUCOSE, CAPILLARY
Glucose-Capillary: 100 mg/dL — ABNORMAL HIGH (ref 70–99)
Glucose-Capillary: 317 mg/dL — ABNORMAL HIGH (ref 70–99)
Glucose-Capillary: 144 mg/dL — ABNORMAL HIGH (ref 70–99)
Glucose-Capillary: 177 mg/dL — ABNORMAL HIGH (ref 70–99)

## 2024-08-21 MED ORDER — INSULIN ASPART 100 UNIT/ML IJ SOLN
3.0000 [IU] | Freq: Three times a day (TID) | INTRAMUSCULAR | Status: DC
  Administered 2024-08-21 – 2024-08-23 (×6): 3 [IU] via SUBCUTANEOUS
  Filled 2024-08-21 (×6): qty 3

## 2024-08-21 NOTE — Progress Notes (Signed)
  Progress Note  Patient Name: William Wells Date of Encounter: 08/21/2024 [facility] Cardiologist: Vishnu P Mallipeddi, MD   Interval Summary    Creatinine stable at 2.3.  BP 103/74.  Denies any chest pain or dyspnea or lightheadedness  Vital Signs Vitals:   08/21/24 0536 08/21/24 0537 08/21/24 0538 08/21/24 0539  BP:      Pulse: 71 66 72 72  Resp:      Temp:      TempSrc:      SpO2: 96% 95% 96% 95%  Weight:    11.8 kg  Height:        Intake/Output Summary (Last 24 hours) at 08/21/2024 1155 Last data filed at 08/21/2024 0830 Gross per 24 hour  Intake 810 ml  Output 1450 ml  Net -640 ml      08/21/2024    5:39 AM 08/20/2024    5:33 AM 08/19/2024    5:00 AM  Last 3 Weights  Weight (lbs) 26 lb 1.3 oz 179 lb 14.3 oz 176 lb 5.9 oz  Weight (kg) 11.83 kg 81.6 kg 80 kg      Telemetry/ECG   Tele: Atrial sensed, ventricular paced- Personally Reviewed EKG: No new ECG  Physical Exam   General: Well developed, well nourished, NAD  SKIN: warm, dry. Neuro: No focal deficits  Psychiatric: Mood and affect normal  Neck: No JVD Lungs:Clear bilaterally, no wheezes, rhonci, crackles Cardiovascular: Regular rate and rhythm. No murmurs, gallops or rubs. Abdomen:Soft.  Extremities: No lower extremity edema.    Assessment & Plan   89 y.o. male with history of CKD, HTN, lung cancer s/p lobectomy admitted with complete heart block and missed STEMI. He was not felt to be a candidate for cardiac cath given CKD and advanced age. No cardiac cath has been performed. He was managed by the Advanced Heart Failure team over the past week and has been followed by our EP team. He continued to have evidence of low heart rates with high grade AV block. Pacemaker placed 11/13/125.    Inferior MI: Late presenting MI with cardiogenic shock. Shock has now resolved.  He is not a good candidate for cath given advanced age and acute on chronic renal failure. He has overall preserved LV  systolic function by echo. No plans for cardiac cath. No chest pain.  Continue conservative management of presumed CAD.  Continue ASA, Plavix and statin.    High grade AV block: He is now post pacemaker placement and doing well.   AKI on CKD: Presented with creatinine 3.6, has been slowly improving,  2.3 today   Awaiting placement for rehab  Signed,  Lonni LITTIE Nanas, MD  08/21/2024 11:55 AM

## 2024-08-21 NOTE — Plan of Care (Signed)
  Problem: Education: Goal: Ability to describe self-care measures that may prevent or decrease complications (Diabetes Survival Skills Education) will improve Outcome: Progressing   Problem: Education: Goal: Understanding of cardiac disease, CV risk reduction, and recovery process will improve Outcome: Progressing   Problem: Activity: Goal: Ability to tolerate increased activity will improve Outcome: Progressing   Problem: Cardiac: Goal: Ability to achieve and maintain adequate cardiovascular perfusion will improve Outcome: Progressing   Problem: Education: Goal: Ability to describe self-care measures that may prevent or decrease complications (Diabetes Survival Skills Education) will improve Outcome: Progressing   Problem: Education: Goal: Understanding of cardiac disease, CV risk reduction, and recovery process will improve Outcome: Progressing   Problem: Activity: Goal: Ability to tolerate increased activity will improve Outcome: Progressing   Problem: Cardiac: Goal: Ability to achieve and maintain adequate cardiovascular perfusion will improve Outcome: Progressing   Problem: Education: Goal: Ability to describe self-care measures that may prevent or decrease complications (Diabetes Survival Skills Education) will improve Outcome: Progressing   Problem: Education: Goal: Understanding of cardiac disease, CV risk reduction, and recovery process will improve Outcome: Progressing   Problem: Activity: Goal: Ability to tolerate increased activity will improve Outcome: Progressing   Problem: Cardiac: Goal: Ability to achieve and maintain adequate cardiovascular perfusion will improve Outcome: Progressing   Problem: Education: Goal: Ability to describe self-care measures that may prevent or decrease complications (Diabetes Survival Skills Education) will improve Outcome: Progressing   Problem: Education: Goal: Understanding of cardiac disease, CV risk reduction, and  recovery process will improve Outcome: Progressing   Problem: Activity: Goal: Ability to tolerate increased activity will improve Outcome: Progressing   Problem: Cardiac: Goal: Ability to achieve and maintain adequate cardiovascular perfusion will improve Outcome: Progressing   Problem: Education: Goal: Ability to describe self-care measures that may prevent or decrease complications (Diabetes Survival Skills Education) will improve Outcome: Progressing   Problem: Education: Goal: Understanding of cardiac disease, CV risk reduction, and recovery process will improve Outcome: Progressing   Problem: Activity: Goal: Ability to tolerate increased activity will improve Outcome: Progressing   Problem: Cardiac: Goal: Ability to achieve and maintain adequate cardiovascular perfusion will improve Outcome: Progressing   Problem: Education: Goal: Ability to describe self-care measures that may prevent or decrease complications (Diabetes Survival Skills Education) will improve Outcome: Progressing   Problem: Education: Goal: Understanding of cardiac disease, CV risk reduction, and recovery process will improve Outcome: Progressing   Problem: Activity: Goal: Ability to tolerate increased activity will improve Outcome: Progressing   Problem: Cardiac: Goal: Ability to achieve and maintain adequate cardiovascular perfusion will improve Outcome: Progressing

## 2024-08-21 NOTE — Progress Notes (Signed)
 [facility]                                                                               William Wells, is a 89 y.o. male, DOB - 10-Apr-1934, FMW:[id number] Admit date - 08/05/2024    Outpatient Primary MD for the patient is Shona Norleen PEDLAR, MD  LOS - 16  days    Brief summary   Brief History:  89 y.o. male with past medical history of lung cancer s/p LUL lobectomy in 06/2020, HTN, HLD, DM2, and GERD who p/w cardiogenic shock in the setting of inferior MI along with complete heart block and acute kidney injury.  Patient underwent PPM placement.  No new events.   Assessment & Plan    Assessment and Plan:  NSTEMI/inferior MI/cardiogenic shock Echocardiogram showing preserved left ventricular ejection fraction with inferior akinesis patient is on aspirin, Plavix, statin and as needed nitroglycerin.  Cardiology on board.  2:1 heart block in the setting of inferior wall MI Underwent PPM.  No  new complaints of chest pain.     Acute on chronic stage IIIb CKD Patient follows up with Dr. Rachele in the outpatient setting Baseline creatinine is between 1.9-2.1 He came in with a creatinine of 3.6 peaked it peaked at 3.7 and slowly improving. Creatinine improved to 2.3.  No hypotensive episodes at this time.  Patient is not on any ACE inhibitors or ARB's   Hyperkalemia Resolved.     Acute urinary retention Unfortunately failed voiding trial. Patient will be discharged with Foley catheter.  Will need outpatient follow-up with urology for voiding trial Patient was also started on Flomax .    Anemia of chronic disease/normocytic anemia  Hemoglobin around 10 and stable  Hypothyroidism Continue with Synthroid /levothyroxine .   Hyponatremia Sodium of 133.    Mild leukocytosis Resolved.    Hyponatremia Resolved.    Type 2 DM  CBG (last 3)  Recent Labs    08/20/24 2148 08/21/24 0555 08/21/24 1131  GLUCAP 274* 100* 317*   Will add 3 units TIDAC.,  continue with Semglee 7 units daily.  A1c is 7.1%    Please check nocturnal oxygen levels to see if needs oxygen at night.    RN Pressure Injury Documentation: Wound 08/10/24 1212 Pressure Injury Buttocks Right Deep Tissue Pressure Injury - Purple or maroon localized area of discolored intact skin or blood-filled blister due to damage of underlying soft tissue from pressure and/or shear. (Active)     Wound 08/10/24 1200 Pressure Injury Coccyx Medial Stage 2 -  Partial thickness loss of dermis presenting as a shallow open injury with a red, pink wound bed without slough. (Active)  Local wound care.    Estimated body mass index is 3.97 kg/m as calculated from the following:   Height as of this encounter: 5' 8 (1.727 m).   Weight as of this encounter: 11.8 kg.  Code Status: DNR.  DVT Prophylaxis:  Place and maintain sequential compression device Start: 08/19/24 1008 SCDs Start: 08/18/24 1609    Antimicrobials:   Anti-infectives (From admission, onward)    Start     Dose/Rate Route Frequency Ordered Stop   08/18/24 0800  gentamicin (GARAMYCIN)  80 mg in sodium chloride  0.9 % 500 mL irrigation        80 mg Irrigation On call 08/17/24 2210 08/18/24 1531   08/18/24 0800  ceFAZolin  (ANCEF ) IVPB 2g/100 mL premix        2 g 200 mL/hr over 30 Minutes Intravenous On call 08/17/24 2210 08/18/24 1520   08/08/24 1645  cefTRIAXone (ROCEPHIN) 2 g in sodium chloride  0.9 % 100 mL IVPB        2 g 200 mL/hr over 30 Minutes Intravenous Every 24 hours 08/08/24 1552 08/13/24 1655        Medications  Scheduled Meds:  aspirin EC  81 mg Oral Daily   atorvastatin  80 mg Oral Daily   Chlorhexidine  Gluconate Cloth  6 each Topical Daily   clopidogrel  75 mg Oral Daily   gabapentin   300 mg Oral BID   insulin  aspart  0-15 Units Subcutaneous TID WC   insulin  aspart  0-5 Units Subcutaneous QHS   insulin  glargine-yfgn  7 Units Subcutaneous Daily   levothyroxine   75 mcg Oral Q0600   pantoprazole   40 mg Oral Daily   sodium chloride  flush  3 mL Intravenous Q12H   sodium zirconium cyclosilicate  5 g Oral Q M,W,F   tamsulosin   0.4 mg Oral Daily   Continuous Infusions:   PRN Meds:.acetaminophen , alum & mag hydroxide-simeth, Gerhardt's butt cream, guaiFENesin, nitroGLYCERIN, ondansetron  (ZOFRAN ) IV, mouth rinse, oxyCODONE , polyethylene glycol    Subjective:   William Wells was seen and examined today. No new complaints.   Objective:   Vitals:   08/21/24 0537 08/21/24 0538 08/21/24 0539 08/21/24 1200  BP:    115/64  Pulse: 66 72 72 76  Resp:    17  Temp:    97.7 F (36.5 C)  TempSrc:    Oral  SpO2: 95% 96% 95% 96%  Weight:   11.8 kg   Height:        Intake/Output Summary (Last 24 hours) at 08/21/2024 1412 Last data filed at 08/21/2024 0830 Gross per 24 hour  Intake 810 ml  Output 1450 ml  Net -640 ml   Filed Weights   08/19/24 0500 08/20/24 0533 08/21/24 0539  Weight: 80 kg 81.6 kg 11.8 kg     Exam General exam: Appears calm and comfortable  Respiratory system: Clear to auscultation. Respiratory effort normal. Cardiovascular system: S1 & S2 heard, RRR. No JVD,  Gastrointestinal system: Abdomen is nondistended, soft and nontender.  Central nervous system: Alert and oriented.  Extremities: Symmetric 5 x 5 power. Skin: No rashes,  Psychiatry:Mood & affect appropriate.       Data Reviewed:  I have personally reviewed following labs and imaging studies   CBC Lab Results  Component Value Date   WBC 9.5 08/21/2024   RBC 3.39 (L) 08/21/2024   HGB 10.0 (L) 08/21/2024   HCT 30.7 (L) 08/21/2024   MCV 90.6 08/21/2024   MCH 29.5 08/21/2024   PLT 242 08/21/2024   MCHC 32.6 08/21/2024   RDW 15.4 08/21/2024   LYMPHSABS 1.5 04/19/2024   MONOABS 0.6 04/19/2024   EOSABS 0.3 04/19/2024   BASOSABS 0.1 04/19/2024     Last metabolic panel Lab Results  Component Value Date   NA 135 08/21/2024   K 4.1 08/21/2024   CL 101 08/21/2024   CO2 24 08/21/2024    BUN 37 (H) 08/21/2024   CREATININE 2.29 (H) 08/21/2024   GLUCOSE 127 (H) 08/21/2024   GFRNONAA 27 (L) 08/21/2024  GFRAA 43 (L) 06/25/2020   CALCIUM  9.3 08/21/2024   PHOS 3.3 08/15/2024   PROT 6.1 (L) 08/11/2024   ALBUMIN  2.0 (L) 08/15/2024   LABGLOB 3.5 12/02/2023   AGRATIO 1.0 12/02/2023   BILITOT 0.6 08/11/2024   ALKPHOS 111 08/11/2024   AST 131 (H) 08/11/2024   ALT 221 (H) 08/11/2024   ANIONGAP 10 08/21/2024    CBG (last 3)  Recent Labs    08/20/24 2148 08/21/24 0555 08/21/24 1131  GLUCAP 274* 100* 317*      Coagulation Profile: No results for input(s): INR, PROTIME in the last 168 hours.   Radiology Studies: No results found.      Elgie Butter M.D. [facility] 08/21/2024, 2:12 PM  Available via Epic secure chat 7am-7pm After 7 pm, please refer to night coverage provider listed on amion.

## 2024-08-22 DIAGNOSIS — I441 Atrioventricular block, second degree: Secondary | ICD-10-CM | POA: Diagnosis not present

## 2024-08-22 DIAGNOSIS — I2119 ST elevation (STEMI) myocardial infarction involving other coronary artery of inferior wall: Secondary | ICD-10-CM | POA: Diagnosis not present

## 2024-08-22 DIAGNOSIS — R627 Adult failure to thrive: Secondary | ICD-10-CM

## 2024-08-22 DIAGNOSIS — N179 Acute kidney failure, unspecified: Secondary | ICD-10-CM | POA: Diagnosis not present

## 2024-08-22 LAB — CBC
HCT: 29.7 % — ABNORMAL LOW (ref 39.0–52.0)
Hemoglobin: 9.8 g/dL — ABNORMAL LOW (ref 13.0–17.0)
MCH: 29.9 pg (ref 26.0–34.0)
MCHC: 33 g/dL (ref 30.0–36.0)
MCV: 90.5 fL (ref 80.0–100.0)
Platelets: 244 K/uL (ref 150–400)
RBC: 3.28 MIL/uL — ABNORMAL LOW (ref 4.22–5.81)
RDW: 15.3 % (ref 11.5–15.5)
WBC: 10.1 K/uL (ref 4.0–10.5)
nRBC: 0 % (ref 0.0–0.2)

## 2024-08-22 LAB — GLUCOSE, CAPILLARY
Glucose-Capillary: 118 mg/dL — ABNORMAL HIGH (ref 70–99)
Glucose-Capillary: 200 mg/dL — ABNORMAL HIGH (ref 70–99)
Glucose-Capillary: 186 mg/dL — ABNORMAL HIGH (ref 70–99)
Glucose-Capillary: 173 mg/dL — ABNORMAL HIGH (ref 70–99)

## 2024-08-22 LAB — BASIC METABOLIC PANEL WITH GFR
Anion gap: 10 (ref 5–15)
BUN: 41 mg/dL — ABNORMAL HIGH (ref 8–23)
CO2: 21 mmol/L — ABNORMAL LOW (ref 22–32)
Calcium: 9.2 mg/dL (ref 8.9–10.3)
Chloride: 103 mmol/L (ref 98–111)
Creatinine, Ser: 2.32 mg/dL — ABNORMAL HIGH (ref 0.61–1.24)
GFR, Estimated: 26 mL/min — ABNORMAL LOW (ref >60)
Glucose, Bld: 134 mg/dL — ABNORMAL HIGH (ref 70–99)
Potassium: 4.6 mmol/L (ref 3.5–5.1)
Sodium: 134 mmol/L — ABNORMAL LOW (ref 135–145)

## 2024-08-22 NOTE — Progress Notes (Signed)
 Subjective:  He is presently doing well and awaiting rehab placement and prior authorization from the insurance company.  Denies any chest pain or dyspnea.  Patient's daughter is present at the bedside.  Objective:  Vital Signs in the last 24 hours: Temp:  [97.7 F (36.5 C)-98.5 F (36.9 C)] 98.2 F (36.8 C) (11/17 0820) Pulse Rate:  [70-78] 78 (11/17 0820) Resp:  [17-18] 18 (11/17 0820) BP: (108-126)/(52-64) 126/59 (11/17 0820) SpO2:  [95 %-97 %] 97 % (11/17 0820) Weight:  [76.6 kg] 76.6 kg (11/17 0420)  Blood pressure (!) 126/59, pulse 78, temperature 98.2 F (36.8 C), temperature source Oral, resp. rate 18, height 5' 8 (1.727 m), weight 76.6 kg, SpO2 97%.  Intake/Output from previous day: 11/16 0701 - 11/17 0700 In: 830 [P.O.:830] Out: 1900 [Urine:1900] Intake/Output from this shift: Total I/O In: 360 [P.O.:360] Out: -   Physical Exam Neck:     Vascular: No carotid bruit or JVD.  Cardiovascular:     Rate and Rhythm: Normal rate and regular rhythm.     Pulses: Intact distal pulses.     Heart sounds: Normal heart sounds. No murmur heard.    No gallop.  Pulmonary:     Effort: Pulmonary effort is normal.     Breath sounds: Normal breath sounds.  Abdominal:     General: Bowel sounds are normal.     Palpations: Abdomen is soft.  Musculoskeletal:     Right lower leg: No edema.     Left lower leg: No edema.      Lab Results: Recent Labs    08/21/24 0244 08/22/24 0238  WBC 9.5 10.1  HGB 10.0* 9.8*  PLT 242 244   Recent Labs    08/21/24 0244 08/22/24 0238  NA 135 134*  K 4.1 4.6  CL 101 103  CO2 24 21*  GLUCOSE 127* 134*  BUN 37* 41*  CREATININE 2.29* 2.32*   Imaging: Imaging results have been reviewed  Cardiac Studies:  Echocardiogram 08/06/2024:  1. Left ventricular ejection fraction, by estimation, is 50 to 55%. The  left ventricle has low normal function. The left ventricle demonstrates  regional wall motion abnormalities (see scoring  diagram/findings for  description). Left ventricular diastolic   parameters are consistent with Grade I diastolic dysfunction (impaired  relaxation).   2. Right ventricular systolic function is normal. The right ventricular  size is normal. There is moderately elevated pulmonary artery systolic  pressure. The estimated right ventricular systolic pressure is 56.6 mmHg.   Assessment/Plan:  1.  Complete heart block SP permanent dual-chamber pacemaker implantation. 08/18/2024: Med[employer]onic Azure XT DR [id number]  dual-chamber pacemaker for symptomatic bradycardia  Telemetry reviewed, atrially paced rhythm.  No other significant arrhythmias. 2.  Acute inferior STEMI, recommended medical therapy in view of advanced age and acute on chronic kidney disease and late presentation. 3.  Chronic kidney disease stage IV, acute on chronic kidney failure resolved. 4.  Failure to thrive in adult with advanced age, awaiting rehab placement. He did walk with help of OT today within the room with help, still unstable on his feet to be discharged home as there is no one at home during the daytime as his daughter works.  Patient not safe to be discharged home.  He remains stable from a cardiac standpoint, I did not make any changes to his medications.    LOS: 17 days    William Wells 08/22/2024, 10:38 AM

## 2024-08-22 NOTE — Progress Notes (Signed)
 Pt. Placed on overnight pulse ox. No issues at this time.

## 2024-08-22 NOTE — Progress Notes (Signed)
 Occupational Therapy Treatment Patient Details Name: William Wells MRN: [id number] DOB: 05-23-1934 Today's Date: 08/22/2024   History of present illness 89 y.o M adm 08/05/24 with weakness, chest pain, fall, missed MI, CHB, AKI. 11/13 LUE PPM implant. PMH: arthritis, T2DM, HTN, hypothyroidism, lung CA s/p Lt upper lobectomy, CKD, HLD   OT comments  Pt presented in bed and motivated to participate in session. He was able to complete bed mobility with HOB elevated with CGA and cues on how to complete. He then required light min assist to CGA for sit to stand transfers and step pivoted to chair with RW with CGA. He then required mod assist for peri care for thoroughness and then ambulated to chair for CGA. Pt then was able to complete hallway ambulation with RW and close CGA with min-mod cues on positioning. Patient will benefit from intensive inpatient follow-up therapy, >3 hours/day.       If plan is discharge home, recommend the following:  A lot of help with walking and/or transfers;A lot of help with bathing/dressing/bathroom;Assistance with cooking/housework;Direct supervision/assist for medications management;Direct supervision/assist for financial management;Assist for transportation   Equipment Recommendations   (TBD at next venue)    Recommendations for Other Services Rehab consult    Precautions / Restrictions Precautions Precautions: Fall;ICD/Pacemaker Recall of Precautions/Restrictions: Intact Precaution/Restrictions Comments: watch O2/HR, incontinent BM helpful to sit on BSC before walking in hallway Restrictions Weight Bearing Restrictions Per Provider Order: No       Mobility Bed Mobility Overal bed mobility: Needs Assistance Bed Mobility: Supine to Sit     Supine to sit: Contact guard     General bed mobility comments: cues on how to sequence    Transfers Overall transfer level: Needs assistance Equipment used: Rolling walker (2 wheels) Transfers: Sit to/from  Stand Sit to Stand: Contact guard assist, Min assist           General transfer comment: pt needs cues on hand placement and steading as slight posterior tilt when intially standing     Balance Overall balance assessment: Needs assistance Sitting-balance support: No upper extremity supported, Feet supported Sitting balance-Leahy Scale: Fair     Standing balance support: Bilateral upper extremity supported, During functional activity, Reliant on assistive device for balance Standing balance-Leahy Scale: Poor                             ADL either performed or assessed with clinical judgement   ADL Overall ADL's : Needs assistance/impaired Eating/Feeding: Set up;Sitting   Grooming: Set up;Sitting   Upper Body Bathing: Minimal assistance;Sitting   Lower Body Bathing: Moderate assistance;Sit to/from stand;Sitting/lateral leans   Upper Body Dressing : Minimal assistance;Sitting;Contact guard assist   Lower Body Dressing: Moderate assistance;Sitting/lateral leans;Sit to/from stand   Toilet Transfer: Contact guard assist;Cueing for safety;Cueing for sequencing;Rolling walker (2 wheels)   Toileting- Clothing Manipulation and Hygiene: Moderate assistance;Sit to/from stand Toileting - Clothing Manipulation Details (indicate cue type and reason): pt intially started to complete but need assist to ensure throughness     Functional mobility during ADLs: Contact guard assist;Rolling walker (2 wheels);Cueing for sequencing;Cueing for safety      Extremity/Trunk Assessment Upper Extremity Assessment Upper Extremity Assessment: Generalized weakness   Lower Extremity Assessment Lower Extremity Assessment: Defer to PT evaluation        Vision       Perception     Praxis     Communication Communication Communication: No apparent  difficulties   Cognition Arousal: Alert Behavior During Therapy: WFL for tasks assessed/performed Cognition: No apparent  impairments                               Following commands: Intact        Cueing   Cueing Techniques: Verbal cues, Gestural cues, Tactile cues  Exercises      Shoulder Instructions       General Comments      Pertinent Vitals/ Pain       Pain Assessment Pain Assessment: No/denies pain Pain Score: 0-No pain  Home Living                                          Prior Functioning/Environment              Frequency  Min 2X/week        Progress Toward Goals  OT Goals(current goals can now be found in the care plan section)  Progress towards OT goals: Progressing toward goals  Acute Rehab OT Goals Patient Stated Goal: to get home OT Goal Formulation: With patient Time For Goal Achievement: 09/02/24 Potential to Achieve Goals: Good ADL Goals Pt Will Perform Upper Body Dressing: sitting;with contact guard assist Pt Will Perform Lower Body Dressing: sitting/lateral leans;sit to/from stand;with contact guard assist Pt Will Transfer to Toilet: with supervision;ambulating Pt Will Perform Tub/Shower Transfer: Shower transfer;with min assist;ambulating;shower seat  Plan      Co-evaluation                 AM-PAC OT 6 Clicks Daily Activity     Outcome Measure   Help from another person eating meals?: None Help from another person taking care of personal grooming?: A Little Help from another person toileting, which includes using toliet, bedpan, or urinal?: A Lot Help from another person bathing (including washing, rinsing, drying)?: A Little Help from another person to put on and taking off regular upper body clothing?: A Little Help from another person to put on and taking off regular lower body clothing?: A Lot 6 Click Score: 17    End of Session Equipment Utilized During Treatment: Gait belt;Rolling walker (2 wheels)  OT Visit Diagnosis: Unsteadiness on feet (R26.81);Other abnormalities of gait and mobility  (R26.89);Muscle weakness (generalized) (M62.81)   Activity Tolerance Patient tolerated treatment well   Patient Left in chair;with call bell/phone within reach;with chair alarm set;with family/visitor present   Nurse Communication Mobility status        Time: 9182-9144 OT Time Calculation (min): 38 min  Charges: OT General Charges $OT Visit: 1 Visit OT Treatments $Self Care/Home Management : 38-52 mins  William Wells  Acute Rehab Services  [phone] office number   William Berber 08/22/2024, 10:15 AM

## 2024-08-22 NOTE — Progress Notes (Signed)
 [facility]                                                                               William Wells, is a 89 y.o. male, DOB - 09-Nov-1933, FMW:[id number] Admit date - 08/05/2024    Outpatient Primary MD for the patient is Shona Norleen PEDLAR, MD  LOS - 17  days    Brief summary   Brief History:  89 y.o. male with past medical history of lung cancer s/p LUL lobectomy in 06/2020, HTN, HLD, DM2, and GERD who p/w cardiogenic shock in the setting of inferior MI along with complete heart block and acute kidney injury.  Patient underwent PPM placement.    Assessment & Plan    Assessment and Plan:  NSTEMI/inferior MI/cardiogenic shock Echocardiogram showing preserved left ventricular ejection fraction with inferior akinesis patient is on aspirin, Plavix, statin and as needed nitroglycerin.  Cardiology on board.  2:1 heart block in the setting of inferior wall MI Underwent PPM.  No  new complaints of chest pain.    Acute on chronic stage IIIb CKD Patient follows up with Dr. Rachele in the outpatient setting Baseline creatinine is between 1.9-2.1 He came in with a creatinine of 3.6 peaked it peaked at 3.7 and slowly improving. Creatinine improved to 2.3.  No hypotensive episodes at this time.  Patient is not on any ACE inhibitors or ARB's   Hyperkalemia Resolved.   Acute urinary retention Unfortunately failed voiding trial. Patient will be discharged with Foley catheter.  Will need outpatient follow-up with urology for voiding trial Patient was also started on Flomax .   Anemia of chronic disease/normocytic anemia  Hemoglobin around 10 and stable  Hypothyroidism Continue with Synthroid /levothyroxine .   Hyponatremia Asymptomatic, mild.    Mild leukocytosis Resolved.    Type 2 DM with hyperglycemia CBG (last 3)  Recent Labs    08/22/24 0627 08/22/24 1146 08/22/24 1601  GLUCAP 118* 200* 186*   Will add 3 units TIDAC., continue with Semglee 7 units daily.   A1c is 7.1%    Please check nocturnal oxygen levels to see if needs oxygen at night on discharge.    RN Pressure Injury Documentation: Wound 08/10/24 1212 Pressure Injury Buttocks Right Deep Tissue Pressure Injury - Purple or maroon localized area of discolored intact skin or blood-filled blister due to damage of underlying soft tissue from pressure and/or shear. (Active)     Wound 08/10/24 1200 Pressure Injury Coccyx Medial Stage 2 -  Partial thickness loss of dermis presenting as a shallow open injury with a red, pink wound bed without slough. (Active)  Local wound care.    Estimated body mass index is 25.68 kg/m as calculated from the following:   Height as of this encounter: 5' 8 (1.727 m).   Weight as of this encounter: 76.6 kg.  Code Status: DNR.  DVT Prophylaxis:  Place and maintain sequential compression device Start: 08/19/24 1008 SCDs Start: 08/18/24 1609    Antimicrobials:   Anti-infectives (From admission, onward)    Start     Dose/Rate Route Frequency Ordered Stop   08/18/24 0800  gentamicin (GARAMYCIN) 80 mg in sodium chloride  0.9 % 500 mL irrigation  80 mg Irrigation On call 08/17/24 2210 08/18/24 1531   08/18/24 0800  ceFAZolin  (ANCEF ) IVPB 2g/100 mL premix        2 g 200 mL/hr over 30 Minutes Intravenous On call 08/17/24 2210 08/18/24 1520   08/08/24 1645  cefTRIAXone (ROCEPHIN) 2 g in sodium chloride  0.9 % 100 mL IVPB        2 g 200 mL/hr over 30 Minutes Intravenous Every 24 hours 08/08/24 1552 08/13/24 1655        Medications  Scheduled Meds:  aspirin EC  81 mg Oral Daily   atorvastatin  80 mg Oral Daily   Chlorhexidine  Gluconate Cloth  6 each Topical Daily   clopidogrel  75 mg Oral Daily   gabapentin   300 mg Oral BID   insulin  aspart  0-15 Units Subcutaneous TID WC   insulin  aspart  0-5 Units Subcutaneous QHS   insulin  aspart  3 Units Subcutaneous TID WC   insulin  glargine-yfgn  7 Units Subcutaneous Daily   levothyroxine   75 mcg Oral  Q0600   pantoprazole  40 mg Oral Daily   sodium chloride  flush  3 mL Intravenous Q12H   sodium zirconium cyclosilicate  5 g Oral Q M,W,F   tamsulosin   0.4 mg Oral Daily   Continuous Infusions:   PRN Meds:.acetaminophen , alum & mag hydroxide-simeth, Gerhardt's butt cream, guaiFENesin, nitroGLYCERIN, ondansetron  (ZOFRAN ) IV, mouth rinse, oxyCODONE , polyethylene glycol    Subjective:   William Wells was seen and examined today.  No chest pain or sob.   Objective:   Vitals:   08/22/24 0420 08/22/24 0820 08/22/24 1319 08/22/24 1603  BP: (!) 111/59 (!) 126/59 117/64 123/65  Pulse: 70 78 78 77  Resp: 18 18 17 18   Temp: 98.2 F (36.8 C) 98.2 F (36.8 C) 98.1 F (36.7 C) 98 F (36.7 C)  TempSrc: Oral Oral Oral Oral  SpO2: 97% 97% 95% 96%  Weight: 76.6 kg     Height:        Intake/Output Summary (Last 24 hours) at 08/22/2024 1614 Last data filed at 08/22/2024 1317 Gross per 24 hour  Intake 1190 ml  Output 2225 ml  Net -1035 ml   Filed Weights   08/20/24 0533 08/21/24 0539 08/22/24 0420  Weight: 81.6 kg 11.8 kg 76.6 kg     Exam General exam: Appears calm and comfortable  Respiratory system: Clear to auscultation. Respiratory effort normal. Cardiovascular system: S1 & S2 heard, RRR. No JVD,  Gastrointestinal system: Abdomen is nondistended, soft and nontender.  Central nervous system: Alert and oriented.  Extremities: no pedal edema.  Skin: No rashes,  Psychiatry:  Mood & affect appropriate.       Data Reviewed:  I have personally reviewed following labs and imaging studies   CBC Lab Results  Component Value Date   WBC 10.1 08/22/2024   RBC 3.28 (L) 08/22/2024   HGB 9.8 (L) 08/22/2024   HCT 29.7 (L) 08/22/2024   MCV 90.5 08/22/2024   MCH 29.9 08/22/2024   PLT 244 08/22/2024   MCHC 33.0 08/22/2024   RDW 15.3 08/22/2024   LYMPHSABS 1.5 04/19/2024   MONOABS 0.6 04/19/2024   EOSABS 0.3 04/19/2024   BASOSABS 0.1 04/19/2024     Last metabolic panel Lab  Results  Component Value Date   NA 134 (L) 08/22/2024   K 4.6 08/22/2024   CL 103 08/22/2024   CO2 21 (L) 08/22/2024   BUN 41 (H) 08/22/2024   CREATININE 2.32 (H) 08/22/2024   GLUCOSE  134 (H) 08/22/2024   GFRNONAA 26 (L) 08/22/2024   GFRAA 43 (L) 06/25/2020   CALCIUM  9.2 08/22/2024   PHOS 3.3 08/15/2024   PROT 6.1 (L) 08/11/2024   ALBUMIN  2.0 (L) 08/15/2024   LABGLOB 3.5 12/02/2023   AGRATIO 1.0 12/02/2023   BILITOT 0.6 08/11/2024   ALKPHOS 111 08/11/2024   AST 131 (H) 08/11/2024   ALT 221 (H) 08/11/2024   ANIONGAP 10 08/22/2024    CBG (last 3)  Recent Labs    08/22/24 0627 08/22/24 1146 08/22/24 1601  GLUCAP 118* 200* 186*      Coagulation Profile: No results for input(s): INR, PROTIME in the last 168 hours.   Radiology Studies: No results found.      Elgie Butter M.D. [facility] 08/22/2024, 4:14 PM  Available via Epic secure chat 7am-7pm After 7 pm, please refer to night coverage provider listed on amion.

## 2024-08-22 NOTE — Plan of Care (Signed)
  Problem: Education: Goal: Ability to describe self-care measures that may prevent or decrease complications (Diabetes Survival Skills Education) will improve Outcome: Progressing   Problem: Coping: Goal: Ability to adjust to condition or change in health will improve Outcome: Progressing   Problem: Fluid Volume: Goal: Ability to maintain a balanced intake and output will improve Outcome: Progressing   Problem: Health Behavior/Discharge Planning: Goal: Ability to identify and utilize available resources and services will improve Outcome: Progressing   Problem: Health Behavior/Discharge Planning: Goal: Ability to manage health-related needs will improve Outcome: Progressing   Problem: Metabolic: Goal: Ability to maintain appropriate glucose levels will improve Outcome: Progressing   Problem: Education: Goal: Understanding of cardiac disease, CV risk reduction, and recovery process will improve Outcome: Progressing

## 2024-08-22 NOTE — Progress Notes (Signed)
 Inpatient Rehab Admissions Coordinator:   Pt.s denial for CIR was upheld on appeal. CIR will sign off. Family prefers [facility].   Leita Kleine, MS, CCC-SLP Rehab Admissions Coordinator  [phone] (celll) [phone] (office)

## 2024-08-22 NOTE — TOC Progression Note (Addendum)
 Transition of Care [facility]) - Progression Note    Patient Details  Name: William Wells MRN: [id number] Date of Birth: 09/25/1934  Transition of [facility]) CM/SW Contact  William Wells, William Wells Phone Number: 08/22/2024, 11:40 AM  Clinical Narrative:   Awaiting decision from insurance on appeal for CIR.   12:26 PM William Wells called CSW to inform that CIR appeal has been denied at this time. William Wells stated she is going to go tour [facility] and Rehab at [facility] and will let CSW know if that is the SNF family is choosing. CSW reviewed insurance authorization process with William Wells (pts dtr).   12:42 PM William Wells called CSW and stated William Wells would like for patient to discharge to a SNF in [place] instead as it is closer to [employer] job and William Wells is going back home in [place] . William Wells stated she is going to tour [place] and [employer].   3:34 PM Family chose [facility] at this time. CSW submitted for insurance authorization. CSW unable to see auth details and called Nav[employer] w/ [employer]. Per William Wells is pending for review; auth id [id number].   3:55 PM CSW asked facility about foley that patient is discharging with. Facility stated patient can d/c to them with the foley.   CSW will continue to follow.    Expected Discharge Plan: IP Rehab Facility Barriers to Discharge: Continued Medical Work up               Expected Discharge Plan and Services   Discharge Planning Services: CM Consult   Living arrangements for the past 2 months: Single Family Home                                       Social Drivers of Health (SDOH) Interventions SDOH Screenings   Food Insecurity: Wells Food Insecurity (08/06/2024)  Housing: Low Risk  (08/06/2024)  Transportation Needs: Wells Transportation Needs (08/06/2024)  Utilities: Not At Risk (08/06/2024)  Alcohol Screen: Low Risk  (10/25/2020)  Depression (PHQ2-9): Low Risk  (04/19/2024)  Financial Resource Strain: Low Risk  (10/25/2020)  Physical Activity: Sufficiently Active  (10/25/2020)  Social Connections: Moderately Isolated (08/06/2024)  Stress: Wells Stress Concern Present (10/25/2020)  Tobacco Use: Medium Risk (08/06/2024)    Readmission Risk Interventions     Wells data to display

## 2024-08-22 NOTE — Progress Notes (Signed)
 Mobility Specialist Progress Note:    08/22/24 1154  Mobility  Activity Respositioned in chair (heel/toe raises, leg Ext, Knee Lifts)  Level of Assistance Standby assist, set-up cues, supervision of patient - no hands on  Assistive Device None  Range of Motion/Exercises Left leg;Right leg;Active  Activity Response Tolerated fair  Mobility Referral Yes  Mobility visit 1 Mobility  Mobility Specialist Start Time (ACUTE ONLY) 1154  Mobility Specialist Stop Time (ACUTE ONLY) 1200  Mobility Specialist Time Calculation (min) (ACUTE ONLY) 6 min   Received pt in recliner agreeable to session. No c/o any symptoms but pt seemed somewhat fatigued. Pt able to perform movements decently w/ intermittent breaks. Left pt in recliner w/ all needs met.   [name] Mobility Specialist Please Neurosurgeon or Rehab Office at [phone]

## 2024-08-23 DIAGNOSIS — I441 Atrioventricular block, second degree: Secondary | ICD-10-CM | POA: Diagnosis not present

## 2024-08-23 DIAGNOSIS — E118 Type 2 diabetes mellitus with unspecified complications: Secondary | ICD-10-CM

## 2024-08-23 DIAGNOSIS — R570 Cardiogenic shock: Secondary | ICD-10-CM | POA: Insufficient documentation

## 2024-08-23 DIAGNOSIS — R338 Other retention of urine: Secondary | ICD-10-CM | POA: Insufficient documentation

## 2024-08-23 DIAGNOSIS — I2119 ST elevation (STEMI) myocardial infarction involving other coronary artery of inferior wall: Secondary | ICD-10-CM | POA: Diagnosis not present

## 2024-08-23 DIAGNOSIS — E039 Hypothyroidism, unspecified: Secondary | ICD-10-CM | POA: Insufficient documentation

## 2024-08-23 DIAGNOSIS — N179 Acute kidney failure, unspecified: Secondary | ICD-10-CM | POA: Diagnosis not present

## 2024-08-23 DIAGNOSIS — I213 ST elevation (STEMI) myocardial infarction of unspecified site: Secondary | ICD-10-CM

## 2024-08-23 LAB — CBC
HCT: 29.6 % — ABNORMAL LOW (ref 39.0–52.0)
Hemoglobin: 9.8 g/dL — ABNORMAL LOW (ref 13.0–17.0)
MCH: 30.1 pg (ref 26.0–34.0)
MCHC: 33.1 g/dL (ref 30.0–36.0)
MCV: 90.8 fL (ref 80.0–100.0)
Platelets: 242 K/uL (ref 150–400)
RBC: 3.26 MIL/uL — ABNORMAL LOW (ref 4.22–5.81)
RDW: 15.2 % (ref 11.5–15.5)
WBC: 8.8 K/uL (ref 4.0–10.5)
nRBC: 0 % (ref 0.0–0.2)

## 2024-08-23 LAB — GLUCOSE, CAPILLARY
Glucose-Capillary: 129 mg/dL — ABNORMAL HIGH (ref 70–99)
Glucose-Capillary: 162 mg/dL — ABNORMAL HIGH (ref 70–99)

## 2024-08-23 MED ORDER — INSULIN GLARGINE-YFGN 100 UNIT/ML ~~LOC~~ SOLN: 8.0000 [IU] | Freq: Every day | SUBCUTANEOUS | 11 refills | Status: DC

## 2024-08-23 MED ORDER — ASPIRIN 81 MG PO TBEC: 81.0000 mg | DELAYED_RELEASE_TABLET | Freq: Every day | ORAL | Status: AC

## 2024-08-23 MED ORDER — NITROGLYCERIN 0.4 MG SL SUBL: 0.4000 mg | SUBLINGUAL_TABLET | SUBLINGUAL | Status: DC | PRN

## 2024-08-23 MED ORDER — TAMSULOSIN HCL 0.4 MG PO CAPS: 0.4000 mg | ORAL_CAPSULE | Freq: Every day | ORAL | 1 refills | Status: AC

## 2024-08-23 MED ORDER — POLYETHYLENE GLYCOL 3350 17 G PO PACK: 17.0000 g | PACK | Freq: Every day | ORAL | 0 refills | Status: AC | PRN

## 2024-08-23 MED ORDER — GABAPENTIN 300 MG PO CAPS: 300.0000 mg | ORAL_CAPSULE | Freq: Two times a day (BID) | ORAL | 2 refills | Status: AC

## 2024-08-23 MED ORDER — ATORVASTATIN CALCIUM 80 MG PO TABS: 80.0000 mg | ORAL_TABLET | Freq: Every day | ORAL | Status: DC

## 2024-08-23 MED ORDER — INSULIN ASPART 100 UNIT/ML IJ SOLN: INTRAMUSCULAR | 11 refills | Status: AC

## 2024-08-23 MED ORDER — CLOPIDOGREL BISULFATE 75 MG PO TABS: 75.0000 mg | ORAL_TABLET | Freq: Every day | ORAL | Status: DC

## 2024-08-23 NOTE — Plan of Care (Signed)
  Problem: Fluid Volume: Goal: Ability to maintain a balanced intake and output will improve Outcome: Progressing   Problem: Tissue Perfusion: Goal: Adequacy of tissue perfusion will improve Outcome: Progressing   Problem: Activity: Goal: Ability to tolerate increased activity will improve Outcome: Progressing   Problem: Clinical Measurements: Goal: Will remain free from infection Outcome: Progressing Goal: Respiratory complications will improve Outcome: Progressing Goal: Cardiovascular complication will be avoided Outcome: Progressing   Problem: Coping: Goal: Level of anxiety will decrease Outcome: Progressing   Problem: Elimination: Goal: Will not experience complications related to urinary retention Outcome: Progressing   Problem: Safety: Goal: Ability to remain free from injury will improve Outcome: Progressing   Problem: Cardiac: Goal: Ability to achieve and maintain adequate cardiopulmonary perfusion will improve Outcome: Progressing

## 2024-08-23 NOTE — Progress Notes (Signed)
 Physical Therapy Treatment Patient Details Name: William Wells MRN: [id number] DOB: July 14, 1934 Today's Date: 08/23/2024   History of Present Illness 89 y.o M adm 08/05/24 with weakness, chest pain, fall, missed MI, CHB, AKI. 11/13 PPM implant. PMH: arthritis, T2DM, HTN, hypothyroidism, lung CA s/p Lt upper lobectomy, CKD, HLD    PT Comments  Pt pleasant and very willing to participate. Pt able to state PPM precautions and requires min cues to maintain throughout mobility. Pt with increased gait and HEP tolerance this session with continued need for assist with transfers and gait. Patient will benefit from continued inpatient follow up therapy, <3 hours/day     If plan is discharge home, recommend the following: A lot of help with bathing/dressing/bathroom;Assistance with cooking/housework;Assist for transportation;Help with stairs or ramp for entrance;A little help with walking and/or transfers   Can travel by private vehicle     Yes  Equipment Recommendations  Rollator (4 wheels)    Recommendations for Other Services       Precautions / Restrictions Precautions Precautions: Fall;ICD/Pacemaker Recall of Precautions/Restrictions: Intact     Mobility  Bed Mobility               General bed mobility comments: in chair on arrival end of session    Transfers Overall transfer level: Needs assistance   Transfers: Sit to/from Stand Sit to Stand: Min assist, Contact guard assist           General transfer comment: initial min assist to rise from recliner and BSC to prevent pt pushing on left elbow, cues for safety and LUE positioning. Pt performed 5 repeated STS from recliner with CGA    Ambulation/Gait Ambulation/Gait assistance: Contact guard assist Gait Distance (Feet): 200 Feet Assistive device: Rollator (4 wheels) Gait Pattern/deviations: Trunk flexed, Step-through pattern, Decreased stride length   Gait velocity interpretation: <1.8 ft/sec, indicate of risk for  recurrent falls   General Gait Details: cues for posture, proximity to rollator and looking up with pt able to self regulate distance. HR 100 with gait, slight tremor with fatigue   Stairs             Wheelchair Mobility     Tilt Bed    Modified Rankin (Stroke Patients Only)       Balance Overall balance assessment: Needs assistance Sitting-balance support: No upper extremity supported, Feet supported Sitting balance-Leahy Scale: Good     Standing balance support: Bilateral upper extremity supported, During functional activity, Reliant on assistive device for balance Standing balance-Leahy Scale: Poor Standing balance comment: Reliant on rollator and external support, trunk flexed in standing                            Communication Communication Communication: No apparent difficulties  Cognition Arousal: Alert Behavior During Therapy: WFL for tasks assessed/performed   PT - Cognitive impairments: Safety/Judgement                       PT - Cognition Comments: needs cues to maintain precautions with all transfers Following commands: Intact      Cueing Cueing Techniques: Verbal cues  Exercises General Exercises - Lower Extremity Long Arc Quad: AROM, Both, Seated, Strengthening, 20 reps Hip Flexion/Marching: AROM, Both, Seated, 20 reps, Strengthening    General Comments        Pertinent Vitals/Pain Pain Assessment Pain Score: 3  Pain Location: Lt shoulder if any movement Pain Descriptors / Indicators:  Sore, Aching Pain Intervention(s): Limited activity within patient's tolerance, Monitored during session, Repositioned    Home Living                          Prior Function            PT Goals (current goals can now be found in the care plan section) Progress towards PT goals: Progressing toward goals    Frequency    Min 2X/week      PT Plan      Co-evaluation              AM-PAC PT 6 Clicks  Mobility   Outcome Measure  Help needed turning from your back to your side while in a flat bed without using bedrails?: A Little Help needed moving from lying on your back to sitting on the side of a flat bed without using bedrails?: A Little Help needed moving to and from a bed to a chair (including a wheelchair)?: A Little Help needed standing up from a chair using your arms (e.g., wheelchair or bedside chair)?: A Little Help needed to walk in hospital room?: A Little Help needed climbing 3-5 steps with a railing? : Total 6 Click Score: 16    End of Session   Activity Tolerance: Patient tolerated treatment well Patient left: with call bell/phone within reach;with family/visitor present;with chair alarm set;in chair Nurse Communication: Mobility status;Other (comment) ([facility] at [facility]) PT Visit Diagnosis: Muscle weakness (generalized) (M62.81);Unsteadiness on feet (R26.81);Other abnormalities of gait and mobility (R26.89);Difficulty in walking, not elsewhere classified (R26.2)     Time: 8769-8743 PT Time Calculation (min) (ACUTE ONLY): 26 min  Charges:    $Gait Training: 8-22 mins $Therapeutic Exercise: 8-22 mins PT General Charges $$ ACUTE PT VISIT: 1 Visit                     William Wells, PT Acute Rehabilitation Services Office: [phone]    William Wells 08/23/2024, 1:44 PM

## 2024-08-23 NOTE — Progress Notes (Signed)
 [facility]                                                                               William Wells, is a 89 y.o. male, DOB - 1934-01-23, FMW:[id number] Admit date - 08/05/2024    Outpatient Primary MD for the patient is Shona Norleen PEDLAR, MD  LOS - 18  days    Brief summary   Brief History:  89 y.o. male with past medical history of lung cancer s/p LUL lobectomy in 06/2020, HTN, HLD, DM2, and GERD who p/w cardiogenic shock in the setting of inferior MI along with complete heart block and acute kidney injury.  Patient underwent PPM placement.    Assessment & Plan    Assessment and Plan:  NSTEMI/inferior MI/cardiogenic shock Echocardiogram showing preserved left ventricular ejection fraction with inferior akinesis patient is on aspirin, Plavix, statin and as needed nitroglycerin.  Cardiology on board.  2:1 heart block in the setting of inferior wall MI Underwent PPM.  No  new complaints of chest pain.    Acute on chronic stage IIIb CKD Patient follows up with Dr. Rachele in the outpatient setting Baseline creatinine is between 1.9-2.1 He came in with a creatinine of 3.6 peaked it peaked at 3.7 and slowly improving. Creatinine improved to 2.3.  No hypotensive episodes at this time.  Patient is not on any ACE inhibitors or ARB's   Hyperkalemia Resolved.   Acute urinary retention Unfortunately failed voiding trial. Patient will be discharged with Foley catheter.  Will need outpatient follow-up with urology for voiding trial Patient was also started on Flomax .   Anemia of chronic disease/normocytic anemia  Hemoglobin around 10 and stable  Hypothyroidism Continue with Synthroid /levothyroxine .   Hyponatremia Asymptomatic, mild.    Mild leukocytosis Resolved.    Type 2 DM with hyperglycemia CBG (last 3)  Recent Labs    08/22/24 2144 08/23/24 0629 08/23/24 1123  GLUCAP 173* 129* 162*  Recommend discharging patient on 8 units of Semglee on  discharge.  Recommend to continue with sliding scale insulin .  Hold home glipizide  on discharge.     Nocturnal oxygen levels have been well over 90%.   RN Pressure Injury Documentation: Wound 08/10/24 1212 Pressure Injury Buttocks Right Deep Tissue Pressure Injury - Purple or maroon localized area of discolored intact skin or blood-filled blister due to damage of underlying soft tissue from pressure and/or shear. (Active)     Wound 08/10/24 1200 Pressure Injury Coccyx Medial Stage 2 -  Partial thickness loss of dermis presenting as a shallow open injury with a red, pink wound bed without slough. (Active)  Local wound care.    Estimated body mass index is 25.71 kg/m as calculated from the following:   Height as of this encounter: 5' 8 (1.727 m).   Weight as of this encounter: 76.7 kg.  Code Status: DNR.  DVT Prophylaxis:  Place and maintain sequential compression device Start: 08/19/24 1008 SCDs Start: 08/18/24 1609    Antimicrobials:   Anti-infectives (From admission, onward)    Start     Dose/Rate Route Frequency Ordered Stop   08/18/24 0800  gentamicin (GARAMYCIN) 80 mg in sodium chloride  0.9 % 500  mL irrigation        80 mg Irrigation On call 08/17/24 2210 08/18/24 1531   08/18/24 0800  ceFAZolin  (ANCEF ) IVPB 2g/100 mL premix        2 g 200 mL/hr over 30 Minutes Intravenous On call 08/17/24 2210 08/18/24 1520   08/08/24 1645  cefTRIAXone (ROCEPHIN) 2 g in sodium chloride  0.9 % 100 mL IVPB        2 g 200 mL/hr over 30 Minutes Intravenous Every 24 hours 08/08/24 1552 08/13/24 1655        Medications  Scheduled Meds:  aspirin EC  81 mg Oral Daily   atorvastatin  80 mg Oral Daily   Chlorhexidine  Gluconate Cloth  6 each Topical Daily   clopidogrel  75 mg Oral Daily   gabapentin   300 mg Oral BID   insulin  aspart  0-15 Units Subcutaneous TID WC   insulin  aspart  0-5 Units Subcutaneous QHS   insulin  aspart  3 Units Subcutaneous TID WC   insulin  glargine-yfgn  7  Units Subcutaneous Daily   levothyroxine   75 mcg Oral Q0600   pantoprazole  40 mg Oral Daily   sodium chloride  flush  3 mL Intravenous Q12H   sodium zirconium cyclosilicate  5 g Oral Q M,W,F   tamsulosin   0.4 mg Oral Daily   Continuous Infusions:   PRN Meds:.acetaminophen , alum & mag hydroxide-simeth, Gerhardt's butt cream, guaiFENesin, nitroGLYCERIN, ondansetron  (ZOFRAN ) IV, mouth rinse, oxyCODONE , polyethylene glycol    Subjective:   William Wells was seen and examined today. No events overnight.   Objective:   Vitals:   08/23/24 0809 08/23/24 0830 08/23/24 1116 08/23/24 1337  BP: 118/65 (!) 126/57 121/65   Pulse: 74 73 68 100  Resp: 18 18 18    Temp: 97.8 F (36.6 C)  (!) 97.4 F (36.3 C)   TempSrc: Oral  Oral   SpO2: 96% 94% 100%   Weight:      Height:        Intake/Output Summary (Last 24 hours) at 08/23/2024 1340 Last data filed at 08/23/2024 1258 Gross per 24 hour  Intake 780 ml  Output 1700 ml  Net -920 ml   Filed Weights   08/21/24 0539 08/22/24 0420 08/23/24 0359  Weight: 11.8 kg 76.6 kg 76.7 kg     Exam General exam: Appears calm and comfortable  Respiratory system: Clear to auscultation. Respiratory effort normal. Cardiovascular system: S1 & S2 heard, RRR.  Gastrointestinal system: Abdomen is nondistended, soft and nontender.  Central nervous system: Alert and oriented.  Extremities: Symmetric 5 x 5 power. Skin: No rashes, Psychiatry: Mood & affect appropriate.        Data Reviewed:  I have personally reviewed following labs and imaging studies   CBC Lab Results  Component Value Date   WBC 8.8 08/23/2024   RBC 3.26 (L) 08/23/2024   HGB 9.8 (L) 08/23/2024   HCT 29.6 (L) 08/23/2024   MCV 90.8 08/23/2024   MCH 30.1 08/23/2024   PLT 242 08/23/2024   MCHC 33.1 08/23/2024   RDW 15.2 08/23/2024   LYMPHSABS 1.5 04/19/2024   MONOABS 0.6 04/19/2024   EOSABS 0.3 04/19/2024   BASOSABS 0.1 04/19/2024     Last metabolic panel Lab Results   Component Value Date   NA 134 (L) 08/22/2024   K 4.6 08/22/2024   CL 103 08/22/2024   CO2 21 (L) 08/22/2024   BUN 41 (H) 08/22/2024   CREATININE 2.32 (H) 08/22/2024   GLUCOSE 134 (H) 08/22/2024  GFRNONAA 26 (L) 08/22/2024   GFRAA 43 (L) 06/25/2020   CALCIUM  9.2 08/22/2024   PHOS 3.3 08/15/2024   PROT 6.1 (L) 08/11/2024   ALBUMIN  2.0 (L) 08/15/2024   LABGLOB 3.5 12/02/2023   AGRATIO 1.0 12/02/2023   BILITOT 0.6 08/11/2024   ALKPHOS 111 08/11/2024   AST 131 (H) 08/11/2024   ALT 221 (H) 08/11/2024   ANIONGAP 10 08/22/2024    CBG (last 3)  Recent Labs    08/22/24 2144 08/23/24 0629 08/23/24 1123  GLUCAP 173* 129* 162*      Coagulation Profile: No results for input(s): INR, PROTIME in the last 168 hours.   Radiology Studies: No results found.      Elgie Butter M.D. [facility] 08/23/2024, 1:40 PM  Available via Epic secure chat 7am-7pm After 7 pm, please refer to night coverage provider listed on amion.

## 2024-08-23 NOTE — Discharge Summary (Addendum)
 Discharge Summary   Patient ID: William Wells MRN: [id number]; DOB: 1934/02/16  Admit date: 08/05/2024 Discharge date: 08/23/2024  PCP:  Shona Norleen PEDLAR, MD   [facility] Providers Cardiologist:  Diannah SHAUNNA Maywood, MD    Discharge Diagnoses  Principal Problem:   Acute ST elevation myocardial infarction (STEMI) of inferior wall [facility]) Active Problems:   AV block, Mobitz 2   AKI (acute kidney injury)   Pressure injury of skin   Cardiogenic shock (HCC)   Type 2 diabetes mellitus with complication, with long-term current use of insulin  (HCC)   Hypothyroidism   Acute urinary retention   Diagnostic Studies/Procedures   Echo: 08/06/2024  IMPRESSIONS     1. Left ventricular ejection fraction, by estimation, is 50 to 55%. The  left ventricle has low normal function. The left ventricle demonstrates  regional wall motion abnormalities (see scoring diagram/findings for  description). Left ventricular diastolic   parameters are consistent with Grade I diastolic dysfunction (impaired  relaxation).   2. Right ventricular systolic function is normal. The right ventricular  size is normal. There is moderately elevated pulmonary artery systolic  pressure. The estimated right ventricular systolic pressure is 56.6 mmHg.   3. The mitral valve is normal in structure. Mild mitral valve  regurgitation. No evidence of mitral stenosis.   4. The aortic valve is normal in structure. There is mild calcification  of the aortic valve. Aortic valve regurgitation is not visualized. Aortic  valve sclerosis is present, with no evidence of aortic valve stenosis.   5. The inferior vena cava is normal in size with greater than 50%  respiratory variability, suggesting right atrial pressure of 3 mmHg.   FINDINGS   Left Ventricle: Left ventricular ejection fraction, by estimation, is 50  to 55%. The left ventricle has low normal function. The left ventricle  demonstrates regional wall motion  abnormalities. The left ventricular  internal cavity size was normal in  size. There is no left ventricular hypertrophy. Left ventricular diastolic  parameters are consistent with Grade I diastolic dysfunction (impaired  relaxation).     LV Wall Scoring:  The inferior wall is akinetic.   Right Ventricle: The right ventricular size is normal. No increase in  right ventricular wall thickness. Right ventricular systolic function is  normal. There is moderately elevated pulmonary artery systolic pressure.  The tricuspid regurgitant velocity is  3.66 m/s, and with an assumed right atrial pressure of 3 mmHg, the  estimated right ventricular systolic pressure is 56.6 mmHg.   Left Atrium: Left atrial size was normal in size.   Right Atrium: Right atrial size was normal in size.   Pericardium: There is no evidence of pericardial effusion.   Mitral Valve: The mitral valve is normal in structure. Mild mitral valve  regurgitation. No evidence of mitral valve stenosis.   Tricuspid Valve: The tricuspid valve is normal in structure. Tricuspid  valve regurgitation is trivial. No evidence of tricuspid stenosis.   Aortic Valve: The aortic valve is normal in structure. There is mild  calcification of the aortic valve. Aortic valve regurgitation is not  visualized. Aortic valve sclerosis is present, with no evidence of aortic  valve stenosis.   Pulmonic Valve: The pulmonic valve was normal in structure. Pulmonic valve  regurgitation is trivial. No evidence of pulmonic stenosis.   Aorta: The aortic root is normal in size and structure.   Venous: The inferior vena cava is normal in size with greater than 50%  respiratory variability, suggesting  right atrial pressure of 3 mmHg.   IAS/Shunts: No atrial level shunt detected by color flow Doppler.   _____________   History of Present Illness   William Wells is a 89 y.o. male with past medical history of lung cancer status post robotic assisted  left upper lobectomy '21, hypertension, diabetes, CKD stage IIIb who presented to the ER with chest pain.  Reported that chest pain lasted for about 30 minutes multiple times throughout the prior day and up to 10 days prior to admission.  However 4 days before presentation chest pain worsened to the point that it was recurring with rest.  In the ED he was complaining of 4 out of 10 chest pain and EKG showed second-degree AV block 2-1 with subtle ST elevation in inferior leads.  Initial troponin noted at 22,494.    Hospital Course   Consultants: CHF, IM, EP, Nephrology    Inferior MI -- His case was discussed with on-call interventional cardiology, Dr. Wendel and felt to have been a late presenting MI, no actionable STEMI noted on EKG.  He was managed medically given his worsening CKD and age. -- High-sensitivity troponin [place] at 224[address] -- Treated with heparin for 48 hours, aspirin, Plavix, atorvastatin 80 mg daily. Continue medical management. -- Echo 11/1 LVEF of 50-55%, g1DD, normal RV, moderately elevated PASP 56.66mmHg    Complete Heart Block -- in the setting of inferior MI, initially CHB, progressed to 2:1 AVB -- remains in 2:1 AVB, shock resolved. Has episode of chest pain with rates in the 20s evening of 11/11 -- seen again by EP, underwent permanent dual-chamber pacemaker implantation on 11/13   AKI on CKD stage III Acute urinary retention  -- Baseline creatinine of 1.8 -- Creatinine peaked at 4.2, then down-trending 2.32  in the setting of heart block and inferior MI, as well as obstruction -- s/p foley removal 11/4, replaced 11/5. On flomax . Net - 27.4L -- nephrology signed off -- continue flomax , will need outpatient follow up with urology to consideration of foley removal   Hypotension Cardiogenic shock -- as above in the setting of MI -- initially required pressors and weaned. Required midodrine, but able to wean prior to discharge  -- GDMT: limited with soft BPs and  renal function   DM -- treated with SSI while inpatient -- rec's for semglee 8 units on discharge, home glipizide  resumed  Hypothyroidism -- Continue with Synthroid /levothyroxine     Did the patient have an acute coronary syndrome (MI, NSTEMI, STEMI, etc) this admission?:  Yes                               AHA/ACC ACS Clinical Performance & Quality Measures: Aspirin prescribed? - Yes ADP Receptor Inhibitor (Plavix/Clopidogrel, Brilinta/Ticagrelor or Effient/Prasugrel) prescribed (includes medically managed patients)? - Yes Beta Blocker prescribed? - No - soft BP High Intensity Statin (Lipitor 40-80mg  or Crestor  20-40mg ) prescribed? - Yes EF assessed during THIS hospitalization? - Yes For EF <40%, was ACEI/ARB prescribed? - Not Applicable (EF >/= 40%) For EF <40%, Aldosterone Antagonist (Spironolactone or Eplerenone) prescribed? - Not Applicable (EF >/= 40%) Cardiac Rehab Phase II ordered (including medically managed patients)? - Yes   The patient will be scheduled for a TOC follow up appointment in 10-14 days.  A message has been sent to the [facility] and Scheduling Pool at the office where the patient should be seen for follow up.  _____________  Discharge Vitals  Blood pressure 121/65, pulse 100, temperature (!) 97.4 F (36.3 C), temperature source Oral, resp. rate 18, height 5' 8 (1.727 m), weight 76.7 kg, SpO2 100%.  Filed Weights   08/21/24 0539 08/22/24 0420 08/23/24 0359  Weight: 11.8 kg 76.6 kg 76.7 kg    Labs & Radiologic Studies  CBC Recent Labs    08/22/24 0238 08/23/24 0315  WBC 10.1 8.8  HGB 9.8* 9.8*  HCT 29.7* 29.6*  MCV 90.5 90.8  PLT 244 242   Basic Metabolic Panel Recent Labs    88/83/74 0244 08/22/24 0238  NA 135 134*  K 4.1 4.6  CL 101 103  CO2 24 21*  GLUCOSE 127* 134*  BUN 37* 41*  CREATININE 2.29* 2.32*  CALCIUM  9.3 9.2   Liver Function Tests No results for input(s): AST, ALT, ALKPHOS, BILITOT, PROT, ALBUMIN  in the last 72  hours. No results for input(s): LIPASE, AMYLASE in the last 72 hours. High Sensitivity Troponin:   Recent Labs  Lab 08/05/24 1543 08/05/24 1741 08/16/24 0826  TROPONINIHS 22,494* 16,320* 458*    No results for input(s): TRNPT in the last 720 hours.  BNP Invalid input(s): POCBNP No results for input(s): PROBNP in the last 72 hours.  No results for input(s): BNP in the last 72 hours.  D-Dimer No results for input(s): DDIMER in the last 72 hours. Hemoglobin A1C No results for input(s): HGBA1C in the last 72 hours. Fasting Lipid Panel No results for input(s): CHOL, HDL, LDLCALC, TRIG, CHOLHDL, LDLDIRECT in the last 72 hours. Lipoprotein (a)  Date/Time Value Ref Range Status  08/06/2024 03:20 AM 45.9 (H) <75.0 nmol/L Final    Comment:    (NOTE) This test was developed and its performance characteristics determined by [facility]. It has not been cleared or approved by the Food and Drug Administration. Note:  Values greater than or equal to 75.0 nmol/L may       indicate an independent risk factor for CHD,       but must be evaluated with caution when applied       to non-Caucasian populations due to the       influence of genetic factors on Lp(a) across       ethnicities. Performed At: [facility] [address] [place], [place] [id number] Jennette Shorter MD Ey:[id number]     Thyroid  Function Tests No results for input(s): TSH, T4TOTAL, T3FREE, THYROIDAB in the last 72 hours.  Invalid input(s): FREET3 _____________  DG Chest 2 View Result Date: 08/19/2024 CLINICAL DATA:  730013 Cardiac device in situ, other [id number] EXAM: CHEST - 2 VIEW COMPARISON:  Radiographs 08/05/2024 and 06/18/2021.  CT 08/05/2024. FINDINGS: New left subclavian pacemaker leads project over the right atrium and right ventricle. The heart size and mediastinal contours are stable with aortic atherosclerosis and mediastinal shift to the left. There is stable volume  loss in the left hemithorax with perihilar scarring and pleural thickening attributed to previous left upper lobectomy. The right lung appears clear. No evidence of pneumothorax or significant pleural effusion. The bones appear unchanged. IMPRESSION: Interval pacemaker placement as described. No evidence of pneumothorax or other acute cardiopulmonary process. Stable postsurgical changes in the left hemithorax. Electronically Signed   By: Elsie Perone M.D.   On: 08/19/2024 11:36   EP PPM/ICD IMPLANT Result Date: 08/18/2024 SURGEON:  Soyla Norton, MD   PREPROCEDURE DIAGNOSIS:  second degree AV block   POSTPROCEDURE DIAGNOSIS:  second degree AV block    PROCEDURES:  1. Pacemaker implantation.  INTRODUCTION:  William Wells is a 89 y.o. male with a history of bradycardia who presents today for pacemaker implantation.  The patient reports intermittent episodes of dizziness over the past few months.  No reversible causes have been identified.  The patient therefore presents today for pacemaker implantation.   DESCRIPTION OF PROCEDURE:  Informed written consent was obtained, and  the patient was brought to the electrophysiology lab in a fasting state.  The patient required no sedation for the procedure today.  The patients left chest was prepped and draped in the usual sterile fashion by the EP lab staff. The skin overlying the left deltopectoral region was infiltrated with lidocaine  for local analgesia.  A 4-cm incision was made over the left deltopectoral region.  A left subcutaneous pacemaker pocket was fashioned using a combination of sharp and blunt dissection. Electrocautery was required to assure hemostasis.  RA/RV Lead Placement: The left axillary vein was therefore cannulated.  Through the left axillary vein, a Medtronic CapSureFix [id number] [id number]  (serial number  [id number]) right atrial lead and an Medtronic SelectSure [id number] (serial number  [id number]) right ventricular lead were advanced with fluoroscopic  visualization into the right atrial appendage and left bundle area positions respectively.  Initial atrial lead P- waves measured 2.1 mV with impedance of 625 ohms and a threshold of 0.9 V at 0.5 msec.  Right ventricular lead R-waves measured 8.1 mV with an impedance of 882 ohms and a threshold of 0.9 V at 0.5 msec.  Both leads were secured to the pectoralis fascia using #2-0 silk over the suture sleeves. Device Placement:  The leads were then connected to an Medtronic Azure XT DR [id number]   (serial number  [id number] ) pacemaker.  The pocket was irrigated with copious gentamicin solution.  The pacemaker was then placed into the pocket.  The pocket was then closed in 3 layers with 2.0 and 3.0 V-Loc suture for the subcutaneous layers and 3.0 Vicryl suture for the subcuticular layers.  Steri-  Strips and a sterile dressing were then applied. EBL<5ml.  There were no early apparent complications.   CONCLUSIONS:  1. Successful implantation of a Medtronic Azure XT DR [id number]  dual-chamber pacemaker for symptomatic bradycardia  2. No early apparent complications.   Will Inocencio, MD 08/18/2024 3:45 PM  ECHOCARDIOGRAM COMPLETE Result Date: 08/06/2024    ECHOCARDIOGRAM REPORT   Patient Name:   William Wells Date of Exam: 08/06/2024 Medical Rec #:  [id number]     Height:       68.0 in Accession #:    [id number]    Weight:       178.0 lb Date of Birth:  09/02/1934      BSA:          1.945 m Patient Age:    89 years      BP:           96/51 mmHg Patient Gender: M             HR:           48 bpm. Exam Location:  Inpatient Procedure: 2D Echo (Both Spectral and Color Flow Doppler were utilized during            procedure). Indications:    myocardial infarct  History:        Patient has prior history of Echocardiogram examinations, most                 recent 09/17/2023. Chronic kidney disease,  Arrythmias:second                 degree av block; Risk Factors:Dyslipidemia.  Sonographer:    Tinnie Barefoot RDCS Referring Phys: [id number]  VISHNU P MALLIPEDDI  Sonographer Comments: Image acquisition challenging due to respiratory motion. IMPRESSIONS  1. Left ventricular ejection fraction, by estimation, is 50 to 55%. The left ventricle has low normal function. The left ventricle demonstrates regional wall motion abnormalities (see scoring diagram/findings for description). Left ventricular diastolic  parameters are consistent with Grade I diastolic dysfunction (impaired relaxation).  2. Right ventricular systolic function is normal. The right ventricular size is normal. There is moderately elevated pulmonary artery systolic pressure. The estimated right ventricular systolic pressure is 56.6 mmHg.  3. The mitral valve is normal in structure. Mild mitral valve regurgitation. No evidence of mitral stenosis.  4. The aortic valve is normal in structure. There is mild calcification of the aortic valve. Aortic valve regurgitation is not visualized. Aortic valve sclerosis is present, with no evidence of aortic valve stenosis.  5. The inferior vena cava is normal in size with greater than 50% respiratory variability, suggesting right atrial pressure of 3 mmHg. FINDINGS  Left Ventricle: Left ventricular ejection fraction, by estimation, is 50 to 55%. The left ventricle has low normal function. The left ventricle demonstrates regional wall motion abnormalities. The left ventricular internal cavity size was normal in size. There is no left ventricular hypertrophy. Left ventricular diastolic parameters are consistent with Grade I diastolic dysfunction (impaired relaxation).  LV Wall Scoring: The inferior wall is akinetic. Right Ventricle: The right ventricular size is normal. No increase in right ventricular wall thickness. Right ventricular systolic function is normal. There is moderately elevated pulmonary artery systolic pressure. The tricuspid regurgitant velocity is 3.66 m/s, and with an assumed right atrial pressure of 3 mmHg, the estimated right ventricular  systolic pressure is 56.6 mmHg. Left Atrium: Left atrial size was normal in size. Right Atrium: Right atrial size was normal in size. Pericardium: There is no evidence of pericardial effusion. Mitral Valve: The mitral valve is normal in structure. Mild mitral valve regurgitation. No evidence of mitral valve stenosis. Tricuspid Valve: The tricuspid valve is normal in structure. Tricuspid valve regurgitation is trivial. No evidence of tricuspid stenosis. Aortic Valve: The aortic valve is normal in structure. There is mild calcification of the aortic valve. Aortic valve regurgitation is not visualized. Aortic valve sclerosis is present, with no evidence of aortic valve stenosis. Pulmonic Valve: The pulmonic valve was normal in structure. Pulmonic valve regurgitation is trivial. No evidence of pulmonic stenosis. Aorta: The aortic root is normal in size and structure. Venous: The inferior vena cava is normal in size with greater than 50% respiratory variability, suggesting right atrial pressure of 3 mmHg. IAS/Shunts: No atrial level shunt detected by color flow Doppler.  LEFT VENTRICLE PLAX 2D LVIDd:         4.90 cm   Diastology LVIDs:         3.40 cm   LV e' medial:    8.49 cm/s LV PW:         0.80 cm   LV E/e' medial:  19.8 LV IVS:        1.20 cm   LV e' lateral:   9.36 cm/s LVOT diam:     2.00 cm   LV E/e' lateral: 17.9 LVOT Area:     3.14 cm LV IVRT:       359 msec  RIGHT VENTRICLE  IVC RV Basal diam:  2.90 cm     IVC diam: 2.30 cm RV S prime:     19.60 cm/s TAPSE (M-mode): 2.5 cm LEFT ATRIUM             Index        RIGHT ATRIUM           Index LA diam:        4.30 cm 2.21 cm/m   RA Area:     16.30 cm LA Vol (A2C):   54.9 ml 28.23 ml/m  RA Volume:   41.80 ml  21.49 ml/m LA Vol (A4C):   61.9 ml 31.83 ml/m LA Biplane Vol: 61.0 ml 31.36 ml/m   AORTA Ao Root diam: 3.30 cm Ao Asc diam:  3.40 cm MITRAL VALVE                  TRICUSPID VALVE MV Area (PHT): 3.20 cm       TR Peak grad:   53.6 mmHg MV Decel  Time: 237 msec       TR Vmax:        366.00 cm/s MR Peak grad:    105.7 mmHg MR Mean grad:    66.0 mmHg    SHUNTS MR Vmax:         514.00 cm/s  Systemic Diam: 2.00 cm MR Vmean:        377.0 cm/s MR PISA:         3.08 cm MR PISA Eff ROA: 23 mm MR PISA Radius:  0.70 cm MV E velocity: 168.00 cm/s MV A velocity: 141.00 cm/s MV E/A ratio:  1.19 Oneil Parchment MD Electronically signed by Oneil Parchment MD Signature Date/Time: 08/06/2024/5:03:57 PM    Final    US  EKG SITE RITE Result Date: 08/06/2024 If Site Rite image not attached, placement could not be confirmed due to current cardiac rhythm.  CT Cervical Spine Wo Contrast Result Date: 08/05/2024 CLINICAL DATA:  Clemens out of bed, weakness EXAM: CT CERVICAL SPINE WITHOUT CONTRAST TECHNIQUE: Multidetector CT imaging of the cervical spine was performed without intravenous contrast. Multiplanar CT image reconstructions were also generated. RADIATION DOSE REDUCTION: This exam was performed according to the departmental dose-optimization program which includes automated exposure control, adjustment of the mA and/or kV according to patient size and/or use of iterative reconstruction technique. COMPARISON:  04/05/2020 FINDINGS: Alignment: Mild degenerative anterolisthesis of C6 on C7. Skull base and vertebrae: No acute fracture. No primary bone lesion or focal pathologic process. Soft tissues and spinal canal: No prevertebral fluid or swelling. No visible canal hematoma. Disc levels: Previous laminectomy spanning C3 through C6. There is bony fusion across the C3-4 and C4-5 disc spaces. There is severe spondylosis at C5-6 and C6-7. Upper chest: Airway is patent. Emphysematous changes are seen at the lung apices. Other: Reconstructed images demonstrate no additional findings. IMPRESSION: 1. No acute cervical spine fracture. 2. Multilevel postsurgical and degenerative changes as above. Electronically Signed   By: Ozell Daring M.D.   On: 08/05/2024 19:44   CT CHEST ABDOMEN  PELVIS WO CONTRAST Result Date: 08/05/2024 CLINICAL DATA:  Chest trauma fall left lower quadrant pain EXAM: CT CHEST, ABDOMEN AND PELVIS WITHOUT CONTRAST TECHNIQUE: Multidetector CT imaging of the chest, abdomen and pelvis was performed following the standard protocol without IV contrast. RADIATION DOSE REDUCTION: This exam was performed according to the departmental dose-optimization program which includes automated exposure control, adjustment of the mA and/or kV according to patient size and/or use  of iterative reconstruction technique. COMPARISON:  CT 03/03/2024, 12/02/2023, PET CT 04/09/2020 FINDINGS: CT CHEST FINDINGS Cardiovascular: Limited assessment without intravenous contrast. Advanced aortic atherosclerosis without aneurysm. Multi vessel coronary vascular calcification. Normal cardiac size. No pericardial effusion Mediastinum/Nodes: Patent trachea. No thyroid  mass. No suspicious lymph nodes. Esophagus shows small hiatal hernia Lungs/Pleura: Advanced emphysema. Status post left upper lobectomy. No acute airspace disease. Volume loss left hemithorax. Small chronic thick-walled pleural collection. Questionable nodular slightly dense area within the left pleural collection measuring 12 mm but there is artifact. Musculoskeletal: Sternum appears intact. There are degenerative changes. No acute osseous abnormality CT ABDOMEN PELVIS FINDINGS Hepatobiliary: No focal liver abnormality is seen. No gallstones, gallbladder wall thickening, or biliary dilatation. Pancreas: Unremarkable. No pancreatic ductal dilatation or surrounding inflammatory changes. Spleen: Normal in size without focal abnormality. Adrenals/Urinary Tract: Adrenal glands are normal. No hydronephrosis. Renal cysts for which no imaging follow-up is recommended. Slightly thick-walled appearance of the urinary bladder. Stomach/Bowel: Decompressed stomach. Gastric wall thickening up to 4.3 cm. No dilated small bowel. Diverticular disease of the colon  without acute inflammation. Vascular/Lymphatic: Aortic atherosclerosis. No enlarged abdominal or pelvic lymph nodes. Reproductive: Enlarged prostate Other: Negative for pelvic effusion or free air. Small fat containing right inguinal hernia Musculoskeletal: Multilevel degenerative changes. No acute osseous abnormality. IMPRESSION: 1. No CT evidence for acute intrathoracic, intra-abdominal, or intrapelvic abnormality allowing for absence of contrast. 2. Status post left upper lobectomy. Small chronic thick-walled left pleural collection. Questionable nodular slightly dense area within the left pleural collection measuring 12 mm but there is artifact. Short-term follow-up chest CT with contrast is suggested to ascertain if this finding persists. 3. Advanced emphysema. 4. Diffuse gastric wall thickening up to 4.3 cm maximum, recommend correlation with endoscopy. 5. 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 6. Aortic atherosclerosis. Aortic Atherosclerosis (ICD10-I70.0) and Emphysema (ICD10-J43.9). Electronically Signed   By: Luke Bun M.D.   On: 08/05/2024 18:47   CT Head Wo Contrast Result Date: 08/05/2024 EXAM: CT HEAD WITHOUT CONTRAST 08/05/2024 06:22:00 PM TECHNIQUE: CT of the head was performed without the administration of intravenous contrast. Automated exposure control, iterative reconstruction, and/or weight based adjustment of the mA/kV was utilized to reduce the radiation dose to as low as reasonably achievable. COMPARISON: 09/06/2021 CLINICAL HISTORY: Polytrauma, blunt. FINDINGS: BRAIN AND VENTRICLES: Mild age-related volume loss. No acute hemorrhage. No evidence of acute infarct. No hydrocephalus. No extra-axial collection. No mass effect or midline shift. ORBITS: No acute abnormality. SINUSES: No acute abnormality. SOFT TISSUES AND SKULL: No acute soft tissue abnormality. No skull fracture. IMPRESSION: 1. No acute intracranial abnormality. Electronically signed by: Franky Crease  MD 08/05/2024 06:26 PM EDT RP Workstation: [id number]   DG Chest Portable 1 View Result Date: 08/05/2024 EXAM: 1 VIEW(S) XRAY OF THE CHEST 08/05/2024 04:32:00 PM COMPARISON: Comparison 06/18/2021. CLINICAL HISTORY: infectious workup FINDINGS: LUNGS AND PLEURA: Stable postoperative changes are noted in the left lung base. No focal pulmonary opacity. No pulmonary edema. No pleural effusion. No pneumothorax. HEART AND MEDIASTINUM: Stable mediastinal shift to the left. No acute abnormality of the cardiac silhouette. BONES AND SOFT TISSUES: No acute osseous abnormality. IMPRESSION: 1. Stable postoperative changes in the left lung base with persistent mediastinal shift to the left. 2. No acute cardiopulmonary abnormality identified. Electronically signed by: Lynwood Seip MD 08/05/2024 04:56 PM EDT RP Workstation: [id number]    Disposition Pt is being discharged home today in good condition.  Follow-up Plans & Appointments  Contact information for after-discharge care     Destination  [place] [facility] [place], [place] .   Service: Skilled Nursing Contact information: [address] [place]  [address] [phone]                    Discharge Instructions     Diet - low sodium heart healthy   Complete by: As directed    Increase activity slowly   Complete by: As directed    No wound care   Complete by: As directed        Discharge Medications Allergies as of 08/23/2024   No Known Allergies      Medication List     STOP taking these medications    HYDROcodone -acetaminophen  7.5-325 MG tablet Commonly known as: NORCO   omeprazole 20 MG capsule Commonly known as: PRILOSEC   pantoprazole 20 MG tablet Commonly known as: PROTONIX   rosuvastatin  40 MG tablet Commonly known as: CRESTOR    telmisartan 20 MG tablet Commonly known as: MICARDIS       TAKE these medications    acetaminophen  500 MG tablet Commonly known as: TYLENOL  Take 2 tablets  (1,000 mg total) by mouth every 6 (six) hours. What changed:  how much to take when to take this reasons to take this   albuterol  108 (90 Base) MCG/ACT inhaler Commonly known as: VENTOLIN  HFA INAHLE 2 PUFFS BY MOUTH 4 TIMES A DAY AS NEEDED FOR COUGH/WHEEZING   aspirin EC 81 MG tablet Take 1 tablet (81 mg total) by mouth daily. Swallow whole. Start taking on: August 24, 2024   atorvastatin 80 MG tablet Commonly known as: LIPITOR Take 1 tablet (80 mg total) by mouth daily. Start taking on: August 24, 2024   clopidogrel 75 MG tablet Commonly known as: PLAVIX Take 1 tablet (75 mg total) by mouth daily. Start taking on: August 24, 2024   docusate sodium  100 MG capsule Commonly known as: COLACE Take 1 capsule (100 mg total) by mouth 2 (two) times daily as needed for mild constipation. What changed: when to take this   ferrous sulfate 325 (65 FE) MG tablet Take 325 mg by mouth every Monday, Wednesday, and Friday.   gabapentin  300 MG capsule Commonly known as: NEURONTIN  Take 1 capsule (300 mg total) by mouth 2 (two) times daily. What changed: when to take this   glipiZIDE  2.5 MG 24 hr tablet Commonly known as: GLUCOTROL  XL Take 1.25 mg by mouth daily as needed (blood sugar above 160).   guaiFENesin 600 MG 12 hr tablet Commonly known as: MUCINEX Take 600 mg by mouth 2 (two) times daily.   insulin  aspart 100 UNIT/ML injection Commonly known as: novoLOG  CBG 70 - 120: 0 units  CBG 121 - 150: 2 units  CBG 151 - 200: 3 units  CBG 201 - 250: 5 units  CBG 251 - 300: 8 units  CBG 301 - 350: 11 units  CBG 351 - 400: 15 units   insulin  glargine-yfgn 100 UNIT/ML injection Commonly known as: SEMGLEE Inject 0.08 mLs (8 Units total) into the skin daily. Start taking on: August 24, 2024   levothyroxine  75 MCG tablet Commonly known as: SYNTHROID  Take 75 mcg by mouth at bedtime.   Lokelma 5 g packet Generic drug: sodium zirconium cyclosilicate Take 5 g by mouth every  Monday, Wednesday, and Friday.   MULTIVITAMIN ADULT PO Take 1 tablet by mouth at bedtime.   mupirocin ointment 2 % Commonly known as: BACTROBAN Apply 1 Application topically 3 (three) times daily.   nitroGLYCERIN 0.4  MG SL tablet Commonly known as: NITROSTAT Place 1 tablet (0.4 mg total) under the tongue every 5 (five) minutes as needed for chest pain.   polyethylene glycol 17 g packet Commonly known as: MIRALAX / GLYCOLAX Take 17 g by mouth daily as needed (Constipation).   PRESERVISION AREDS PO Take 1 tablet by mouth in the morning and at bedtime.   tamsulosin  0.4 MG Caps capsule Commonly known as: FLOMAX  Take 1 capsule (0.4 mg total) by mouth daily. Start taking on: August 24, 2024   VITAMIN D  PO Take 2 capsules by mouth at bedtime.   VITAMIN E PO Take 1 capsule by mouth at bedtime.   Voltaren 1 % Gel Generic drug: diclofenac Sodium Apply 1 Application topically daily as needed (pain).         Outstanding Labs/Studies  BMET/CBC at follow up  Duration of Discharge Encounter: APP Time: 35 minutes   Signed, Manuelita Rummer, NP 08/23/2024, 2:05 PM

## 2024-08-23 NOTE — Progress Notes (Signed)
 Subjective:  He is presently doing well and awaiting rehab placement.  Denies any chest pain or dyspnea.  Patient's daughter is present at the bedside.  Objective:  Vital Signs in the last 24 hours: Temp:  [97.4 F (36.3 C)-98.1 F (36.7 C)] 97.4 F (36.3 C) (11/18 1116) Pulse Rate:  [63-83] 68 (11/18 1116) Resp:  [16-19] 18 (11/18 1116) BP: (113-126)/(57-69) 121/65 (11/18 1116) SpO2:  [94 %-100 %] 100 % (11/18 1116) Weight:  [76.7 kg] 76.7 kg (11/18 0359)  Blood pressure 121/65, pulse 68, temperature (!) 97.4 F (36.3 C), temperature source Oral, resp. rate 18, height 5' 8 (1.727 m), weight 76.7 kg, SpO2 100%.  Intake/Output from previous day: 11/17 0701 - 11/18 0700 In: 1020 [P.O.:1020] Out: 1775 [Urine:1775] Intake/Output from this shift: Total I/O In: 120 [P.O.:120] Out: 500 [Urine:500]  Physical Exam Neck:     Vascular: No carotid bruit or JVD.  Cardiovascular:     Rate and Rhythm: Normal rate and regular rhythm.     Pulses: Intact distal pulses.     Heart sounds: Normal heart sounds. No murmur heard.    No gallop.  Pulmonary:     Effort: Pulmonary effort is normal.     Breath sounds: Normal breath sounds.  Abdominal:     General: Bowel sounds are normal.     Palpations: Abdomen is soft.  Musculoskeletal:     Right lower leg: No edema.     Left lower leg: No edema.    Lab Results: Recent Labs    08/22/24 0238 08/23/24 0315  WBC 10.1 8.8  HGB 9.8* 9.8*  PLT 244 242   Recent Labs    08/21/24 0244 08/22/24 0238  NA 135 134*  K 4.1 4.6  CL 101 103  CO2 24 21*  GLUCOSE 127* 134*  BUN 37* 41*  CREATININE 2.29* 2.32*   Imaging: Imaging results have been reviewed  Cardiac Studies:  Echocardiogram 08/06/2024:  1. Left ventricular ejection fraction, by estimation, is 50 to 55%. The  left ventricle has low normal function. The left ventricle demonstrates  regional wall motion abnormalities (see scoring diagram/findings for  description). Left  ventricular diastolic   parameters are consistent with Grade I diastolic dysfunction (impaired  relaxation).   2. Right ventricular systolic function is normal. The right ventricular  size is normal. There is moderately elevated pulmonary artery systolic  pressure. The estimated right ventricular systolic pressure is 56.6 mmHg.   Assessment/Plan:  1.  Complete heart block SP permanent dual-chamber pacemaker implantation. 08/18/2024: Medtronic Azure XT DR [id number]  dual-chamber pacemaker for symptomatic bradycardia  Telemetry reviewed, atrially paced rhythm.  No other significant arrhythmias. 2.  Acute inferior STEMI, recommended medical therapy in view of advanced age and acute on chronic kidney disease and late presentation. 3.  Chronic kidney disease stage IV, acute on chronic kidney failure resolved. 4.  Failure to thrive in adult with advanced age, awaiting rehab placement.  He remains stable from a cardiac standpoint, I did not make any changes to his medications.    LOS: 18 days    William Wells 08/23/2024, 11:34 AM

## 2024-08-23 NOTE — TOC Transition Note (Addendum)
 Transition of Care [facility]) - Discharge Note   Patient Details  Name: William Wells MRN: [id number] Date of Birth: 1933-12-23  [facility]) CM/SW Contact:  Luise JAYSON Pan, LCSWA Phone Number: 08/23/2024, 2:13 PM   Clinical Narrative:   Patient will DC to: [facility]  Anticipated DC date: 08/23/24  Family notified: Verneita [phone]) and Merlynn [phone]) Transport by: Verneita and Merlynn (daughters)    Per MD patient ready for DC to [facility]. RN to call report prior to discharge [phone]). RN, patient, patient's family, and facility notified of DC. Discharge Summary and FL2 sent to facility.   2:29 PM Per Glenys with [facility], they can take patient at 3:30. CSW notified family and bedside RN.   CSW will sign off for now as social work intervention is no longer needed. Please consult us  again if new needs arise.      Final next level of care: Skilled Nursing Facility Barriers to Discharge: Barriers Resolved   Patient Goals and CMS Choice Patient states their goals for this hospitalization and ongoing recovery are:: to get better CMS Medicare.gov Compare Post Acute Care list provided to:: Patient Choice offered to / list presented to : Patient, Adult Children [place] ownership interest in [facility].provided to:: Patient    Discharge Placement PASRR number recieved: 08/16/24            Patient chooses bed at: [facility] Patient to be transferred to facility by: Daughters Thomasine and Merlynn) Name of family member notified: Daughters Thomasine and Merlynn) Patient and family notified of of transfer: 08/23/24  Discharge Plan and Services Additional resources added to the After Visit Summary for     Discharge Planning Services: CM Consult                                 Social Drivers of Health (SDOH) Interventions SDOH Screenings   Food Insecurity: No Food Insecurity (08/06/2024)  Housing: Low Risk   (08/06/2024)  Transportation Needs: No Transportation Needs (08/06/2024)  Utilities: Not At Risk (08/06/2024)  Alcohol Screen: Low Risk  (10/25/2020)  Depression (PHQ2-9): Low Risk  (04/19/2024)  Financial Resource Strain: Low Risk  (10/25/2020)  Physical Activity: Sufficiently Active (10/25/2020)  Social Connections: Moderately Isolated (08/06/2024)  Stress: No Stress Concern Present (10/25/2020)  Tobacco Use: Medium Risk (08/06/2024)     Readmission Risk Interventions     No data to display

## 2024-08-23 NOTE — TOC Progression Note (Addendum)
 Transition of Care [facility]) - Progression Note    Patient Details  Name: William Wells MRN: [id number] Date of Birth: December 14, 1933  Transition of Care [facility]) CM/SW Contact  Luise JAYSON Pan, [place] Phone Number: 08/23/2024, 8:37 AM  Clinical Narrative:   CSW called [employer] with [employer] to check on pending auth status. Per [facility] Nav[employer] rep, shara is pending for review.   12:52 PM CSW followed up on pending auth. Auth approved for [facility] id [id number], approval dates 11/18-11/20; plan id is [id number]. CSW notified facility and inquired about patient discharging today, awaiting response.    1:42 PM Per [facility], patient can discharge to facility today. CSW notified treatment team and Merlynn.  CSW will continue to follow.    Expected Discharge Plan: IP Rehab Facility Barriers to Discharge: Continued Medical Work up               Expected Discharge Plan and Services   Discharge Planning Services: CM Consult   Living arrangements for the past 2 months: Single Family Home                                       Social Drivers of Health (SDOH) Interventions SDOH Screenings   Food Insecurity: No Food Insecurity (08/06/2024)  Housing: Low Risk  (08/06/2024)  Transportation Needs: No Transportation Needs (08/06/2024)  Utilities: Not At Risk (08/06/2024)  Alcohol Screen: Low Risk  (10/25/2020)  Depression (PHQ2-9): Low Risk  (04/19/2024)  Financial Resource Strain: Low Risk  (10/25/2020)  Physical Activity: Sufficiently Active (10/25/2020)  Social Connections: Moderately Isolated (08/06/2024)  Stress: No Stress Concern Present (10/25/2020)  Tobacco Use: Medium Risk (08/06/2024)    Readmission Risk Interventions     No data to display

## 2024-08-23 NOTE — Progress Notes (Signed)
 Gauze dressing remove by EP NP, Patient d/c to [facility] with sterri stripes dressing over surgical incision site to left chest. Transported by his daughters to facility. RN called report to facility but no answer.

## 2024-08-24 ENCOUNTER — Encounter: Payer: Self-pay | Admitting: Emergency Medicine

## 2024-08-24 ENCOUNTER — Telehealth: Payer: Self-pay

## 2024-08-24 NOTE — Telephone Encounter (Signed)
 Follow-up after same day discharge: Implant date: 08/18/2024 MD: Soyla Norton, MD Device: PPM Location: l chest    Wound check visit: 08/31/2024 90 day MD follow-up: 11/30/2024  Remote Transmission received:yes  Dressing/sling removed:   Confirm OAC restart on: LVM on daughter phone  Please continue to monitor your cardiac device site for redness, swelling, and drainage. Call the device clinic at [phone] if you experience these symptoms, fever/chills, or have questions about your device.   Remote monitoring is used to monitor your cardiac device from home. This monitoring is scheduled every 91 days by our office. It allows us  to keep an eye on the functioning of your device to ensure it is working properly.

## 2024-08-31 ENCOUNTER — Ambulatory Visit: Attending: Internal Medicine | Admitting: *Deleted

## 2024-08-31 DIAGNOSIS — I441 Atrioventricular block, second degree: Secondary | ICD-10-CM

## 2024-08-31 LAB — CUP PACEART INCLINIC DEVICE CHECK
Date Time Interrogation Session: 20251126164759
Implantable Lead Connection Status: 753985
Implantable Lead Connection Status: 753985
Implantable Lead Implant Date: 20251113
Implantable Lead Implant Date: 20251113
Implantable Lead Location: 753859
Implantable Lead Location: 753860
Implantable Lead Model: 3830
Implantable Lead Model: 5076
Implantable Pulse Generator Implant Date: 20251113

## 2024-08-31 NOTE — Patient Instructions (Signed)
   After Your Pacemaker   Monitor your pacemaker site for redness, swelling, and drainage. Call the device clinic at [phone] if you experience these symptoms or fever/chills.  Your incision was closed with Steri-strips or staples:  You may shower 7 days after today's visit with soap and water . Avoid lotions, ointments, or perfumes over your incision until it is well-healed.  You may use a hot tub or a pool after your wound check appointment if the incision is completely closed.  Do not lift, push or pull greater than 10 pounds with the affected arm until 6 weeks after your procedure. There are no other restrictions in arm movement after your wound check appointment.  You may drive, unless driving has been restricted by your healthcare providers.  Remote monitoring is used to monitor your pacemaker from home. This monitoring is scheduled every 91 days by our office. It allows us  to keep an eye on the functioning of your device to ensure it is working properly. You will routinely see your Electrophysiologist annually (more often if necessary).

## 2024-08-31 NOTE — Progress Notes (Signed)
 Normal dual chamber pacemaker wound check. Presenting rhythm: AS-AP/VS-VP. Wound well healed. Routine testing performed. Thresholds, sensing, and impedance consistent with implant measurements and at 3.5V safety margin/auto capture until 3 month visit. NSVT x1 on 11/18 that lasted 1 second and 5 beats. Reviewed arm restrictions to continue for 6 weeks total post op.  Pt enrolled in remote follow-up.

## 2024-09-06 NOTE — Progress Notes (Unsigned)
 Cardiology Clinic Note   Patient Name: William Wells Date of Encounter: 09/08/2024  Primary Care Provider:  Shona Norleen PEDLAR, MD Primary Cardiologist:  Vishnu P Mallipeddi, MD  Patient Profile    William Wells 89 year old male presents to the clinic today for follow-up evaluation of his STEMI and CHB.  Past Medical History    Past Medical History:  Diagnosis Date   Acid reflux disease    Arthritis    Complication of anesthesia    appendix surgery (89 year old) - passed out once patient got back to floor unit   Diabetes mellitus    Dyspnea    Family history of adverse reaction to anesthesia    daughter states that she has a difficult time waking up   Hiatal hernia    High cholesterol    Hypertension    Hypothyroidism    Lung cancer (HCC)    s/p XI robotic assisted thoracoscopy for LU lobectomy 06/22/20   Monoclonal gammopathy of unknown significance (MGUS) 03/28/2020   Normocytic anemia 03/28/2020   Renal disorder    renal insufficiency   Type 2 diabetes mellitus (HCC)    Past Surgical History:  Procedure Laterality Date   APPENDECTOMY     ARTERY BIOPSY Right 11/23/2023   Procedure: RIGHT BIOPSY TEMPORAL ARTERY;  Surgeon: Gretta Lonni PARAS, MD;  Location: [facility] OR;  Service: Vascular;  Laterality: Right;   BRONCHIAL BIOPSY  05/29/2020   Procedure: BRONCHIAL BIOPSIES;  Surgeon: Shelah Lamar RAMAN, MD;  Location: [facility] ENDOSCOPY;  Service: Pulmonary;;   BRONCHIAL BRUSHINGS  05/29/2020   Procedure: BRONCHIAL BRUSHINGS;  Surgeon: Shelah Lamar RAMAN, MD;  Location: [facility] ENDOSCOPY;  Service: Pulmonary;;   BRONCHIAL NEEDLE ASPIRATION BIOPSY  05/29/2020   Procedure: BRONCHIAL NEEDLE ASPIRATION BIOPSIES;  Surgeon: Shelah Lamar RAMAN, MD;  Location: [facility] ENDOSCOPY;  Service: Pulmonary;;   BRONCHIAL WASHINGS  05/29/2020   Procedure: BRONCHIAL WASHINGS;  Surgeon: Shelah Lamar RAMAN, MD;  Location: [facility] ENDOSCOPY;  Service: Pulmonary;;   CATARACT EXTRACTION W/PHACO Left 01/23/2023   Procedure: CATARACT  EXTRACTION PHACO AND INTRAOCULAR LENS PLACEMENT (IOC);  Surgeon: Harrie Lynwood, MD;  Location: [facility] ORS;  Service: Ophthalmology;  Laterality: Left;  CDE: 13.69   CATARACT EXTRACTION W/PHACO Right 02/20/2023   Procedure: CATARACT EXTRACTION PHACO AND INTRAOCULAR LENS PLACEMENT (IOC);  Surgeon: Harrie Lynwood, MD;  Location: [facility] ORS;  Service: Ophthalmology;  Laterality: Right;  CDE: 12.88   CERVICAL SPINE SURGERY     EYE SURGERY     strabismus   INTERCOSTAL NERVE BLOCK Left 06/22/2020   Procedure: INTERCOSTAL NERVE BLOCK;  Surgeon: Kerrin Elspeth BROCKS, MD;  Location: [facility] OR;  Service: Thoracic;  Laterality: Left;   NODE DISSECTION Left 06/22/2020   Procedure: NODE DISSECTION;  Surgeon: Kerrin Elspeth BROCKS, MD;  Location: [facility] OR;  Service: Thoracic;  Laterality: Left;   PACEMAKER IMPLANT N/A 08/18/2024   Procedure: PACEMAKER IMPLANT;  Surgeon: Inocencio Soyla Lunger, MD;  Location: [facility] INVASIVE CV LAB;  Service: Cardiovascular;  Laterality: N/A;   TONSILLECTOMY     VIDEO BRONCHOSCOPY WITH ENDOBRONCHIAL NAVIGATION N/A 05/29/2020   Procedure: VIDEO BRONCHOSCOPY WITH ENDOBRONCHIAL NAVIGATION;  Surgeon: Shelah Lamar RAMAN, MD;  Location: [facility] ENDOSCOPY;  Service: Pulmonary;  Laterality: N/A;    Allergies  No Known Allergies  History of Present Illness    William Wells has a PMH of lung cancer s/p robotic assisted left upper lobectomy 2021, HTN, diabetes, CKD stage IIIb, and hyperlipidemia.  He presented to the hospital on  08/05/2024 and was discharged on 08/23/2024.  He presented to the emergency department with chest pain.  He reported his chest pain had lasted for about 30 minutes.  He had his pain multiple times through the prior day and up to 10 days prior to admission.  He did note that 4 days prior to his presentation his chest discomfort was presenting/recurring at rest.  In the emergency department he graded his chest pain as a 4 out of 10.  His EKG showed second-degree AV block with 2-1 subtle  ST elevation in inferior leads.  His initial troponin was over 22,000.  Cardiology was consulted.  He was treated with heparin  for 48 hours as well as aspirin .  He was felt to be a late presenting MI and no actionable STEMI was noted on EKG.  Medical management was recommended.  He was treated with Plavix  and a atorvastatin .  His echocardiogram 11 1 showed an LVEF of 50-55%, G1 DD, normal RV function, moderately elevated PASP pressure.  He was seen and evaluated by EP for his 2-1 AV block he underwent PPM placement on 11/13.  He had improvement with his chest discomfort.  He initially required vasopressors.  These were weaned.  He did require midodrine  which was able to be weaned prior to discharge.  He was limited in his GDMT due to blood pressure.  He was continued on levothyroxine  for hypothyroidism.  He presents to the clinic today for follow-up evaluation and states he is physically active at rehab facility.  He reports that he walked around the whole facility before his office visit today.  He denied chest pain.  We reviewed his hospitalization.  He presents with his daughter.  He notes that he has moved in with her.  He wishes to transition care to [facility].  His blood pressure today is 106/64.  We reviewed his medications.  I will order a BMP today, continue current medication regimen, have him increase his physical activity as tolerated and plan follow-up in 3 to 4 months..  Today he denies chest pain, shortness of breath, lower extremity edema, fatigue, palpitations, melena, hematuria, hemoptysis, diaphoresis, weakness, presyncope, syncope, orthopnea, and PND.   Home Medications    Prior to Admission medications   Medication Sig Start Date End Date Taking? Authorizing Provider  acetaminophen  (TYLENOL ) 500 MG tablet Take 2 tablets (1,000 mg total) by mouth every 6 (six) hours. Patient taking differently: Take 500 mg by mouth daily as needed for headache, moderate pain (pain score 4-6) or  mild pain (pain score 1-3). 06/26/20   Lucien Orren SAILOR, PA-C  albuterol  (VENTOLIN  HFA) 108 (90 Base) MCG/ACT inhaler INAHLE 2 PUFFS BY MOUTH 4 TIMES A DAY AS NEEDED FOR COUGH/WHEEZING    [provider]  aspirin  EC 81 MG tablet Take 1 tablet (81 mg total) by mouth daily. Swallow whole. 08/24/24   Henry Manuelita NOVAK, NP  atorvastatin  (LIPITOR ) 80 MG tablet Take 1 tablet (80 mg total) by mouth daily. 08/24/24   Henry Manuelita NOVAK, NP  clopidogrel  (PLAVIX ) 75 MG tablet Take 1 tablet (75 mg total) by mouth daily. 08/24/24   Henry Manuelita NOVAK, NP  diclofenac Sodium (VOLTAREN) 1 % GEL Apply 1 Application topically daily as needed (pain).    [provider]  docusate sodium  (COLACE) 100 MG capsule Take 1 capsule (100 mg total) by mouth 2 (two) times daily as needed for mild constipation. Patient taking differently: Take 100 mg by mouth 2 (two) times a week. 06/26/20  Lucien Orren SAILOR, PA-C  ferrous sulfate 325 (65 FE) MG tablet Take 325 mg by mouth every Monday, Wednesday, and Friday.    [provider]  gabapentin  (NEURONTIN ) 300 MG capsule Take 1 capsule (300 mg total) by mouth 2 (two) times daily. 08/23/24   Akula, Vijaya, MD  glipiZIDE  (GLUCOTROL  XL) 2.5 MG 24 hr tablet Take 1.25 mg by mouth daily as needed (blood sugar above 160). 03/20/20   [provider]  guaiFENesin  (MUCINEX ) 600 MG 12 hr tablet Take 600 mg by mouth 2 (two) times daily.    [provider]  insulin  aspart (NOVOLOG ) 100 UNIT/ML injection CBG 70 - 120: 0 units  CBG 121 - 150: 2 units  CBG 151 - 200: 3 units  CBG 201 - 250: 5 units  CBG 251 - 300: 8 units  CBG 301 - 350: 11 units  CBG 351 - 400: 15 units 08/23/24   Akula, Vijaya, MD  insulin  glargine-yfgn (SEMGLEE ) 100 UNIT/ML injection Inject 0.08 mLs (8 Units total) into the skin daily. 08/24/24   Akula, Vijaya, MD  levothyroxine  (SYNTHROID ) 75 MCG tablet Take 75 mcg by mouth at bedtime. 05/23/21   [provider]  LOKELMA  5 g  packet Take 5 g by mouth every Monday, Wednesday, and Friday. 10/08/20   [provider]  Multiple Vitamin (MULTIVITAMIN ADULT PO) Take 1 tablet by mouth at bedtime.    [provider]  Multiple Vitamins-Minerals (PRESERVISION AREDS PO) Take 1 tablet by mouth in the morning and at bedtime.    [provider]  mupirocin ointment (BACTROBAN) 2 % Apply 1 Application topically 3 (three) times daily. Patient not taking: Reported on 08/08/2024 06/30/24   [provider]  nitroGLYCERIN  (NITROSTAT ) 0.4 MG SL tablet Place 1 tablet (0.4 mg total) under the tongue every 5 (five) minutes as needed for chest pain. 08/23/24   Henry Manuelita NOVAK, NP  polyethylene glycol (MIRALAX  / GLYCOLAX ) 17 g packet Take 17 g by mouth daily as needed (Constipation). 08/23/24   Akula, Vijaya, MD  tamsulosin  (FLOMAX ) 0.4 MG CAPS capsule Take 1 capsule (0.4 mg total) by mouth daily. 08/24/24   Cherlyn Labella, MD  VITAMIN D  PO Take 2 capsules by mouth at bedtime.    [provider]  VITAMIN E PO Take 1 capsule by mouth at bedtime.    [provider]    Family History    Family History  Problem Relation Age of Onset   Diabetes Mother    Cancer Father    Cancer Sister    Diabetes Brother    Cancer Brother    Diabetes Brother    Parkinson's disease Brother    Diabetes Brother    He indicated that his mother is deceased. He indicated that his father is deceased. He indicated that both of his sisters are deceased. He indicated that two of his four brothers are alive. He indicated that his maternal grandmother is deceased. He indicated that his maternal grandfather is deceased. He indicated that his paternal grandmother is deceased. He indicated that his paternal grandfather is deceased. He indicated that both of his daughters are alive. He indicated that only one of his two sons is alive.  Social History    Social History   Socioeconomic History   Marital status: Widowed     Spouse name: Not on file   Number of children: 4   Years of education: Not on file   Highest education level: Not on file  Occupational History   Occupation: RETIRED  Tobacco Use   Smoking status: Former    Current packs/day: 0.00    Average packs/day: 0.5 packs/day for 47.0 years (23.5 ttl pk-yrs)    Types: Cigarettes    Start date: 17    Quit date: 2000    Years since quitting: 25.9   Smokeless tobacco: Never  Vaping Use   Vaping status: Never Used  Substance and Sexual Activity   Alcohol use: No   Drug use: No   Sexual activity: Not Currently  Other Topics Concern   Not on file  Social History Narrative   Not on file   Social Drivers of Health   Financial Resource Strain: Low Risk  (10/25/2020)   Overall Financial Resource Strain (CARDIA)    Difficulty of Paying Living Expenses: Not hard at all  Food Insecurity: No Food Insecurity (08/06/2024)   Hunger Vital Sign    Worried About Running Out of Food in the Last Year: Never true    Ran Out of Food in the Last Year: Never true  Transportation Needs: No Transportation Needs (08/06/2024)   PRAPARE - Administrator, Civil Service (Medical): No    Lack of Transportation (Non-Medical): No  Physical Activity: Sufficiently Active (10/25/2020)   Exercise Vital Sign    Days of Exercise per Week: 7 days    Minutes of Exercise per Session: 30 min  Stress: No Stress Concern Present (10/25/2020)   [employer] of Occupational Health - Occupational Stress Questionnaire    Feeling of Stress : Only a little  Social Connections: Moderately Isolated (08/06/2024)   Social Connection and Isolation Panel    Frequency of Communication with Friends and Family: More than three times a week    Frequency of Social Gatherings with Friends and Family: More than three times a week    Attends Religious Services: More than 4 times per year    Active Member of [employer] or Organizations: No    Attends Banker Meetings:  Patient declined    Marital Status: Widowed  Intimate Partner Violence: Not At Risk (08/06/2024)   Humiliation, Afraid, Rape, and Kick questionnaire    Fear of Current or Ex-Partner: No    Emotionally Abused: No    Physically Abused: No    Sexually Abused: No     Review of Systems    General:  No chills, fever, night sweats or weight changes.  Cardiovascular:  No chest pain, dyspnea on exertion, edema, orthopnea, palpitations, paroxysmal nocturnal dyspnea. Dermatological: No rash, lesions/masses Respiratory: No cough, dyspnea Urologic: No hematuria, dysuria Abdominal:   No nausea, vomiting, diarrhea, bright red blood per rectum, melena, or hematemesis Neurologic:  No visual changes, wkns, changes in mental status. All other systems reviewed and are otherwise negative except as noted above.  Physical Exam    VS:  BP 106/64 (BP Location: Left Arm, Patient Position: Sitting, Cuff Size: Large)   Pulse 96   Ht 5' 8 (1.727 m)   Wt 172 lb (78 kg)   BMI 26.15 kg/m  , BMI Body mass index is 26.15 kg/m. GEN: Well nourished, well developed, in no acute distress. HEENT: normal. Neck: Supple, no JVD, carotid bruits, or masses. Cardiac: RRR, no murmurs, rubs, or gallops. No clubbing, cyanosis, edema.  Radials/DP/PT 2+ and equal bilaterally.  Respiratory:  Respirations regular and unlabored, clear to auscultation bilaterally. GI: Soft, nontender, nondistended, BS + x 4. MS: no deformity or atrophy. Skin: warm and dry, no  rash. Neuro:  Strength and sensation are intact. Psych: Normal affect.  Accessory Clinical Findings    Recent Labs: 08/05/2024: TSH 1.137 08/08/2024: Magnesium  2.0 08/11/2024: ALT 221 08/22/2024: BUN 41; Creatinine, Ser 2.32; Potassium 4.6; Sodium 134 08/23/2024: Hemoglobin 9.8; Platelets 242   Recent Lipid Panel    Component Value Date/Time   CHOL 92 08/06/2024 0320   TRIG 51 08/06/2024 0320   HDL 52 08/06/2024 0320   CHOLHDL 1.8 08/06/2024 0320   VLDL 10  08/06/2024 0320   LDLCALC 30 08/06/2024 0320         ECG personally reviewed by me today- EKG Interpretation Date/Time:  Thursday September 08 2024 14:47:39 EST Ventricular Rate:  96 PR Interval:  174 QRS Duration:  130 QT Interval:  370 QTC Calculation: 467 R Axis:   22  Text Interpretation: Atrial-sensed ventricular-paced rhythm with occasional Premature ventricular complexes When compared with ECG of 19-Aug-2024 05:17, Premature ventricular complexes are now Present Vent. rate has increased BY  24 BPM Confirmed by Emelia Hazy [phone]) on 09/08/2024 2:54:32 PM   Echocardiogram 08/06/2024  IMPRESSIONS     1. Left ventricular ejection fraction, by estimation, is 50 to 55%. The  left ventricle has low normal function. The left ventricle demonstrates  regional wall motion abnormalities (see scoring diagram/findings for  description). Left ventricular diastolic   parameters are consistent with Grade I diastolic dysfunction (impaired  relaxation).   2. Right ventricular systolic function is normal. The right ventricular  size is normal. There is moderately elevated pulmonary artery systolic  pressure. The estimated right ventricular systolic pressure is 56.6 mmHg.   3. The mitral valve is normal in structure. Mild mitral valve  regurgitation. No evidence of mitral stenosis.   4. The aortic valve is normal in structure. There is mild calcification  of the aortic valve. Aortic valve regurgitation is not visualized. Aortic  valve sclerosis is present, with no evidence of aortic valve stenosis.   5. The inferior vena cava is normal in size with greater than 50%  respiratory variability, suggesting right atrial pressure of 3 mmHg.   FINDINGS   Left Ventricle: Left ventricular ejection fraction, by estimation, is 50  to 55%. The left ventricle has low normal function. The left ventricle  demonstrates regional wall motion abnormalities. The left ventricular  internal cavity size was  normal in  size. There is no left ventricular hypertrophy. Left ventricular diastolic  parameters are consistent with Grade I diastolic dysfunction (impaired  relaxation).     LV Wall Scoring:  The inferior wall is akinetic.   Right Ventricle: The right ventricular size is normal. No increase in  right ventricular wall thickness. Right ventricular systolic function is  normal. There is moderately elevated pulmonary artery systolic pressure.  The tricuspid regurgitant velocity is  3.66 m/s, and with an assumed right atrial pressure of 3 mmHg, the  estimated right ventricular systolic pressure is 56.6 mmHg.   Left Atrium: Left atrial size was normal in size.   Right Atrium: Right atrial size was normal in size.   Pericardium: There is no evidence of pericardial effusion.   Mitral Valve: The mitral valve is normal in structure. Mild mitral valve  regurgitation. No evidence of mitral valve stenosis.   Tricuspid Valve: The tricuspid valve is normal in structure. Tricuspid  valve regurgitation is trivial. No evidence of tricuspid stenosis.   Aortic Valve: The aortic valve is normal in structure. There is mild  calcification of the aortic valve. Aortic valve regurgitation  is not  visualized. Aortic valve sclerosis is present, with no evidence of aortic  valve stenosis.   Pulmonic Valve: The pulmonic valve was normal in structure. Pulmonic valve  regurgitation is trivial. No evidence of pulmonic stenosis.   Aorta: The aortic root is normal in size and structure.   Venous: The inferior vena cava is normal in size with greater than 50%  respiratory variability, suggesting right atrial pressure of 3 mmHg.   IAS/Shunts: No atrial level shunt detected by color flow Doppler.       Assessment & Plan   1.  Inferior MI-no chest pain today.  He was late presentation and medical management was recommended.  Somewhat physically active at rehab facility.  Is discharging to home today.  He  will continue to live with his daughter. Heart healthy low-sodium diet Increase physical activity as tolerated Continue aspirin , Plavix , atorvastatin   Complete heart block-heart rate today 96 bpm.  Had PPM placement on 08/18/2024. Follows with EP  Hypotension, cardiogenic shock-BP today 106/64.  This was initially in the setting of MI.  Blood pressure is well-controlled at home.  Initially unable to initiate/uptitrate GDMT due to soft blood pressure and renal function. Heart healthy low-sodium diet Daily weights Fluid restriction Elevate lower extremities when not active  Hyperlipidemia-LDL 30 on 08/06/2024. High-fiber diet Increase physical activity as tolerated Continue aspirin , atorvastatin   Disposition: Follow-up with Dr. Ladona or me in 3-4 months.   Josefa HERO. Abbigayle Toole NP-C     09/08/2024, 3:25 PM [facility] [address] [place], [place] [address] Office [phone]    Notice: This dictation was prepared with Dragon dictation along with smaller phrase technology. Any transcriptional errors that result from this process are unintentional and may not be corrected upon review.   I spent 14 minutes examining this patient, reviewing medications, and using patient centered shared decision making involving their cardiac care.   I spent  20 minutes reviewing past medical history,  medications, and prior cardiac tests.

## 2024-09-08 ENCOUNTER — Ambulatory Visit: Attending: General Practice | Admitting: General Practice

## 2024-09-08 ENCOUNTER — Ambulatory Visit: Payer: Self-pay | Admitting: Cardiology

## 2024-09-08 ENCOUNTER — Encounter: Payer: Self-pay | Admitting: General Practice

## 2024-09-08 VITALS — BP 106/64 | HR 96 | Ht 68.0 in | Wt 172.0 lb

## 2024-09-08 DIAGNOSIS — I959 Hypotension, unspecified: Secondary | ICD-10-CM

## 2024-09-08 DIAGNOSIS — E782 Mixed hyperlipidemia: Secondary | ICD-10-CM

## 2024-09-08 DIAGNOSIS — I2119 ST elevation (STEMI) myocardial infarction involving other coronary artery of inferior wall: Secondary | ICD-10-CM

## 2024-09-08 DIAGNOSIS — I442 Atrioventricular block, complete: Secondary | ICD-10-CM

## 2024-09-08 DIAGNOSIS — R0602 Shortness of breath: Secondary | ICD-10-CM

## 2024-09-08 MED ORDER — ATORVASTATIN CALCIUM 80 MG PO TABS: 80.0000 mg | ORAL_TABLET | Freq: Every day | ORAL | 3 refills | Status: AC

## 2024-09-08 MED ORDER — CLOPIDOGREL BISULFATE 75 MG PO TABS: 75.0000 mg | ORAL_TABLET | Freq: Every day | ORAL | 3 refills | Status: AC

## 2024-09-08 NOTE — Patient Instructions (Signed)
 Medication Instructions:  NO CHANGES  Lab Work: BMET TO BE DONE TODAY.  Testing/Procedures: NONE  Follow-Up: At [facility], you and your health needs are our priority.  As part of our continuing mission to provide you with exceptional heart care, our providers are all part of one team.  This team includes your primary Cardiologist (physician) and Advanced Practice Providers or APPs (Physician Assistants and Nurse Practitioners) who all work together to provide you with the care you need, when you need it.  Your next appointment:   3 4 MONTHS  Provider:   DR. GORDY BERGAMO, MD OR JOSEFA BEAUVAIS, NP

## 2024-09-09 ENCOUNTER — Ambulatory Visit: Payer: Self-pay | Admitting: General Practice

## 2024-09-09 LAB — BASIC METABOLIC PANEL WITH GFR
BUN/Creatinine Ratio: 19 (ref 10–24)
BUN: 37 mg/dL — ABNORMAL HIGH (ref 8–27)
CO2: 23 mmol/L (ref 20–29)
Calcium: 10 mg/dL (ref 8.6–10.2)
Chloride: 101 mmol/L (ref 96–106)
Creatinine, Ser: 1.95 mg/dL — ABNORMAL HIGH (ref 0.76–1.27)
Glucose: 187 mg/dL — ABNORMAL HIGH (ref 70–99)
Potassium: 5.2 mmol/L (ref 3.5–5.2)
Sodium: 139 mmol/L (ref 134–144)
eGFR: 32 mL/min/1.73 — ABNORMAL LOW (ref >59)

## 2024-09-14 ENCOUNTER — Other Ambulatory Visit: Payer: Self-pay

## 2024-09-14 ENCOUNTER — Emergency Department (HOSPITAL_COMMUNITY)
Admission: EM | Admit: 2024-09-14 | Discharge: 2024-09-14 | Disposition: A | Discharge status: Home / Self Care | Attending: Emergency Medicine | Admitting: Emergency Medicine

## 2024-09-14 ENCOUNTER — Emergency Department (HOSPITAL_COMMUNITY)

## 2024-09-14 DIAGNOSIS — Z794 Long term (current) use of insulin: Secondary | ICD-10-CM | POA: Diagnosis not present

## 2024-09-14 DIAGNOSIS — Z950 Presence of cardiac pacemaker: Secondary | ICD-10-CM | POA: Diagnosis not present

## 2024-09-14 DIAGNOSIS — E871 Hypo-osmolality and hyponatremia: Secondary | ICD-10-CM | POA: Insufficient documentation

## 2024-09-14 DIAGNOSIS — R0789 Other chest pain: Secondary | ICD-10-CM | POA: Insufficient documentation

## 2024-09-14 DIAGNOSIS — Z7982 Long term (current) use of aspirin: Secondary | ICD-10-CM | POA: Diagnosis not present

## 2024-09-14 LAB — CBC WITH DIFFERENTIAL/PLATELET
Abs Immature Granulocytes: 0.05 K/uL (ref 0.00–0.07)
Basophils Absolute: 0.1 K/uL (ref 0.0–0.1)
Basophils Relative: 1 %
Eosinophils Absolute: 0.3 K/uL (ref 0.0–0.5)
Eosinophils Relative: 3 %
HCT: 32.8 % — ABNORMAL LOW (ref 39.0–52.0)
Hemoglobin: 11 g/dL — ABNORMAL LOW (ref 13.0–17.0)
Immature Granulocytes: 1 %
Lymphocytes Relative: 16 %
Lymphs Abs: 1.6 K/uL (ref 0.7–4.0)
MCH: 30.5 pg (ref 26.0–34.0)
MCHC: 33.5 g/dL (ref 30.0–36.0)
MCV: 90.9 fL (ref 80.0–100.0)
Monocytes Absolute: 0.8 K/uL (ref 0.1–1.0)
Monocytes Relative: 8 %
Neutro Abs: 7.6 K/uL (ref 1.7–7.7)
Neutrophils Relative %: 71 %
Platelets: 194 K/uL (ref 150–400)
RBC: 3.61 MIL/uL — ABNORMAL LOW (ref 4.22–5.81)
RDW: 15.1 % (ref 11.5–15.5)
WBC: 10.4 K/uL (ref 4.0–10.5)
nRBC: 0 % (ref 0.0–0.2)

## 2024-09-14 LAB — COMPREHENSIVE METABOLIC PANEL WITH GFR
ALT: 40 U/L (ref 0–44)
AST: 27 U/L (ref 15–41)
Albumin: 2.7 g/dL — ABNORMAL LOW (ref 3.5–5.0)
Alkaline Phosphatase: 70 U/L (ref 38–126)
Anion gap: 7 (ref 5–15)
BUN: 21 mg/dL (ref 8–23)
CO2: 23 mmol/L (ref 22–32)
Calcium: 8.6 mg/dL — ABNORMAL LOW (ref 8.9–10.3)
Chloride: 104 mmol/L (ref 98–111)
Creatinine, Ser: 1.84 mg/dL — ABNORMAL HIGH (ref 0.61–1.24)
GFR, Estimated: 35 mL/min — ABNORMAL LOW (ref >60)
Glucose, Bld: 200 mg/dL — ABNORMAL HIGH (ref 70–99)
Potassium: 3.9 mmol/L (ref 3.5–5.1)
Sodium: 134 mmol/L — ABNORMAL LOW (ref 135–145)
Total Bilirubin: 0.8 mg/dL (ref 0.0–1.2)
Total Protein: 6.6 g/dL (ref 6.5–8.1)

## 2024-09-14 LAB — TROPONIN I (HIGH SENSITIVITY)
Troponin I (High Sensitivity): 19 ng/L — ABNORMAL HIGH (ref <18)
Troponin I (High Sensitivity): 19 ng/L — ABNORMAL HIGH (ref <18)

## 2024-09-14 LAB — CBG MONITORING, ED: Glucose-Capillary: 176 mg/dL — ABNORMAL HIGH (ref 70–99)

## 2024-09-14 MED ORDER — SODIUM CHLORIDE 0.9 % IV SOLN: INTRAVENOUS | Status: DC

## 2024-09-14 NOTE — ED Triage Notes (Signed)
 Patient arrives via [facility] for CP that began last night, not currently present. Patient with two episodes of chest pain approximately 5 min each at rest, no radiation, aching in nature, MI in November. Transferred to [facility] for rehab and was released 5 days ago. Catheter in place supposed to be removed tomorrow at urology. 324 ASA, no nitroglycerin  given. 400 cc fluid in 18G.   EMS vitals 142/68 HR 90 98.2 temp 98 on room air CBG 272

## 2024-09-14 NOTE — ED Notes (Signed)
 Patient discharged by RN. Patient verbalizes understanding of instructions. In wheelchair to daughters car.

## 2024-09-14 NOTE — ED Provider Notes (Signed)
 [facility] AT [facility] Provider Note   CSN: [id number] Arrival date & time: 09/14/24  9070     Patient presents with: Chest Pain   William Wells is a 89 y.o. male.   HPI Patient presents via EMS after 2 episodes of chest pain.  Currently no symptoms, EMS reports no symptoms and route, no hemodynamic instability. History is notable for hospitalization within the past month with cardiac event, placement of pacemaker.  Patient was well until the past 24 hours.    Prior to Admission medications   Medication Sig Start Date End Date Taking? Authorizing Provider  acetaminophen  (TYLENOL ) 500 MG tablet Take 2 tablets (1,000 mg total) by mouth every 6 (six) hours. Patient taking differently: Take 500 mg by mouth daily as needed for headache, moderate pain (pain score 4-6) or mild pain (pain score 1-3). 06/26/20   Lucien Orren SAILOR, PA-C  albuterol  (VENTOLIN  HFA) 108 (90 Base) MCG/ACT inhaler INAHLE 2 PUFFS BY MOUTH 4 TIMES A DAY AS NEEDED FOR COUGH/WHEEZING    [provider]  aspirin  EC 81 MG tablet Take 1 tablet (81 mg total) by mouth daily. Swallow whole. 08/24/24   Henry Manuelita NOVAK, NP  atorvastatin  (LIPITOR ) 80 MG tablet Take 1 tablet (80 mg total) by mouth daily. 09/08/24   Emelia Josefa HERO, NP  clopidogrel  (PLAVIX ) 75 MG tablet Take 1 tablet (75 mg total) by mouth daily. 09/08/24   Emelia Josefa HERO, NP  diclofenac Sodium (VOLTAREN) 1 % GEL Apply 1 Application topically daily as needed (pain).    [provider]  docusate sodium  (COLACE) 100 MG capsule Take 1 capsule (100 mg total) by mouth 2 (two) times daily as needed for mild constipation. Patient taking differently: Take 100 mg by mouth 2 (two) times a week. 06/26/20   Conte, Tessa N, PA-C  ferrous sulfate 325 (65 FE) MG tablet Take 325 mg by mouth every Monday, Wednesday, and Friday.    [provider]  gabapentin  (NEURONTIN ) 300 MG capsule Take 1 capsule (300 mg total) by mouth  2 (two) times daily. 08/23/24   Akula, Vijaya, MD  glipiZIDE  (GLUCOTROL  XL) 2.5 MG 24 hr tablet Take 1.25 mg by mouth daily as needed (blood sugar above 160). 03/20/20   [provider]  guaiFENesin  (MUCINEX ) 600 MG 12 hr tablet Take 600 mg by mouth 2 (two) times daily.    [provider]  insulin  aspart (NOVOLOG ) 100 UNIT/ML injection CBG 70 - 120: 0 units  CBG 121 - 150: 2 units  CBG 151 - 200: 3 units  CBG 201 - 250: 5 units  CBG 251 - 300: 8 units  CBG 301 - 350: 11 units  CBG 351 - 400: 15 units 08/23/24   Akula, Vijaya, MD  levothyroxine  (SYNTHROID ) 75 MCG tablet Take 75 mcg by mouth at bedtime. 05/23/21   [provider]  LOKELMA  5 g packet Take 5 g by mouth every Monday, Wednesday, and Friday. 10/08/20   [provider]  Multiple Vitamin (MULTIVITAMIN ADULT PO) Take 1 tablet by mouth at bedtime.    [provider]  Multiple Vitamins-Minerals (PRESERVISION AREDS PO) Take 1 tablet by mouth in the morning and at bedtime.    [provider]  mupirocin ointment (BACTROBAN) 2 % Apply 1 Application topically 3 (three) times daily. 06/30/24   [provider]  nitroGLYCERIN  (NITROSTAT ) 0.4 MG SL tablet Place 1 tablet (0.4 mg total) under the tongue every 5 (five) minutes  as needed for chest pain. 08/23/24   Henry Manuelita NOVAK, NP  polyethylene glycol (MIRALAX  / GLYCOLAX ) 17 g packet Take 17 g by mouth daily as needed (Constipation). 08/23/24   Akula, Vijaya, MD  tamsulosin  (FLOMAX ) 0.4 MG CAPS capsule Take 1 capsule (0.4 mg total) by mouth daily. 08/24/24   Cherlyn Labella, MD  VITAMIN D  PO Take 2 capsules by mouth at bedtime.    [provider]  VITAMIN E PO Take 1 capsule by mouth at bedtime.    [provider]    Allergies: Patient has no known allergies.    Review of Systems  Updated Vital Signs BP 114/64   Pulse 75   Temp 98 F (36.7 C) (Oral)   Resp (!) 23   Ht 1.727 m (5' 8)   Wt 78 kg   SpO2 100%    BMI 26.15 kg/m   Physical Exam Vitals and nursing note reviewed.  Constitutional:      General: He is not in acute distress.    Appearance: He is well-developed.  HENT:     Head: Normocephalic and atraumatic.  Eyes:     Conjunctiva/sclera: Conjunctivae normal.  Cardiovascular:     Rate and Rhythm: Normal rate and regular rhythm.  Pulmonary:     Effort: Pulmonary effort is normal. No respiratory distress.     Breath sounds: No stridor.  Abdominal:     General: There is no distension.  Skin:    General: Skin is warm and dry.  Neurological:     Mental Status: He is alert and oriented to person, place, and time.     (all labs ordered are listed, but only abnormal results are displayed) Labs Reviewed  COMPREHENSIVE METABOLIC PANEL WITH GFR - Abnormal; Notable for the following components:      Result Value   Sodium 134 (*)    Glucose, Bld 200 (*)    Creatinine, Ser 1.84 (*)    Calcium  8.6 (*)    Albumin  2.7 (*)    GFR, Estimated 35 (*)    All other components within normal limits  CBC WITH DIFFERENTIAL/PLATELET - Abnormal; Notable for the following components:   RBC 3.61 (*)    Hemoglobin 11.0 (*)    HCT 32.8 (*)    All other components within normal limits  CBG MONITORING, ED - Abnormal; Notable for the following components:   Glucose-Capillary 176 (*)    All other components within normal limits  TROPONIN I (HIGH SENSITIVITY) - Abnormal; Notable for the following components:   Troponin I (High Sensitivity) 19 (*)    All other components within normal limits  TROPONIN I (HIGH SENSITIVITY) - Abnormal; Notable for the following components:   Troponin I (High Sensitivity) 19 (*)    All other components within normal limits    EKG: None  Radiology: DG Chest Port 1 View Result Date: 09/14/2024 EXAM: 1 VIEW(S) XRAY OF THE CHEST 09/14/2024 09:55:00 AM COMPARISON: Comparison 26 days ago (08/19/2024). CLINICAL HISTORY: LH? FINDINGS: LINES, TUBES AND DEVICES: Stable  left-sided pacemaker. LUNGS AND PLEURA: Stable left apical thickening. Stable postsurgical changes seen in the left lung consistent with prior lobectomy. The right lung is unremarkable. No pleural effusion. No pneumothorax. HEART AND MEDIASTINUM: No acute abnormality of the cardiac and mediastinal silhouettes. BONES AND SOFT TISSUES: No acute osseous abnormality. IMPRESSION: 1. Stable left-sided pacemaker, left apical thickening, and postsurgical changes in the left lung consistent with prior lobectomy. 2. Right lung is unremarkable. Electronically signed by:  Lynwood Seip MD 09/14/2024 10:28 AM EST RP Workstation: [id number]     Procedures   Medications Ordered in the ED  0.9 %  sodium chloride  infusion ( Intravenous New Bag/Given 09/14/24 9047)                                    Medical Decision Making Elderly male with recent STEMI, pacemaker placement, thus substantial cardiac risk William Wells presents after 2 episodes of chest pain.  Broad differential including spasm, pacemaker dysfunction, ACS, less likely infection given the absence of ongoing complaints, fever, hemodynamic instability.  Amount and/or Complexity of Data Reviewed Independent Historian: EMS External Data Reviewed: notes.    Details: Recent hospitalization Labs: ordered. Decision-making details documented in ED Course. Radiology: ordered and independent interpretation performed. Decision-making details documented in ED Course. ECG/medicine tests: ordered and independent interpretation performed. Decision-making details documented in ED Course.  Risk Prescription drug management. Decision regarding hospitalization. Diagnosis or treatment significantly limited by social determinants of health.  Update: Patient accompanied by wife, niece, pastor. Initial labs reassuring 1:35 PM Patient with no additional episodes of chest pain, has been monitored for hours with no changes. I reviewed his interrogation of the pacemaker,  without events since my last interrogation last month, no arrhythmia this morning. Second troponin normal, patient is reportedly compliant with his medication, and though he does have risk profile given his recent cardiac procedures, with nonischemic EKG, unremarkable troponins, generally noncontributory labs, we discussed admission versus close outpatient follow-up as well as return precautions and the patient was discharged in stable condition.     Final diagnoses:  Atypical chest pain    ED Discharge Orders     None          Garrick Charleston, MD 09/14/24 1335

## 2024-09-14 NOTE — Discharge Instructions (Signed)
 As discussed, your evaluation today has been largely reassuring.  But, it is important that you monitor your condition carefully, and do not hesitate to return to the ED if you develop new, or concerning changes in your condition. ? ?Otherwise, please follow-up with your physician for appropriate ongoing care. ? ?

## 2024-09-16 ENCOUNTER — Telehealth: Payer: Self-pay | Admitting: Internal Medicine

## 2024-09-16 DIAGNOSIS — I25118 Atherosclerotic heart disease of native coronary artery with other forms of angina pectoris: Secondary | ICD-10-CM

## 2024-09-16 MED ORDER — NITROGLYCERIN 0.4 MG SL SUBL: 0.4000 mg | SUBLINGUAL_TABLET | SUBLINGUAL | 3 refills | Status: AC | PRN

## 2024-09-16 NOTE — Telephone Encounter (Signed)
 ICD-10-CM   1. Coronary artery disease of native artery of native heart with stable angina pectoris  I25.118 nitroGLYCERIN  (NITROSTAT ) 0.4 MG SL tablet     No orders of the defined types were placed in this encounter.   Meds ordered this encounter  Medications   nitroGLYCERIN  (NITROSTAT ) 0.4 MG SL tablet    Sig: Place 1 tablet (0.4 mg total) under the tongue every 5 (five) minutes as needed for chest pain.    Dispense:  25 tablet    Refill:  3

## 2024-09-16 NOTE — Telephone Encounter (Signed)
°*  STAT* If patient is at the pharmacy, call can be transferred to refill team.   1. Which medications need to be refilled? (please list name of each medication and dose if known) nitroGLYCERIN  (NITROSTAT ) 0.4 MG SL tablet   2. Which pharmacy/location (including street and city if local pharmacy) is medication to be sent to?  [id number] - [place], [place] - [address]      3. Do they need a 30 day or 90 day supply? 90 day    Pt was seen in ED recently and was told a prescription for this medication would be called in but it never was. [facility] nurse Daphne would like to know if our office can call it in for the pt. If any questions she stated someone can call her at [phone] and leave a message if need be since her signal is no good sometimes.

## 2024-09-26 ENCOUNTER — Telehealth: Payer: Self-pay | Admitting: Physical Medicine and Rehabilitation

## 2024-09-26 NOTE — Telephone Encounter (Signed)
 Pt having severe back pain and request an appointment urgently. Please call pt's daughter Verneita [phone]

## 2024-09-27 ENCOUNTER — Ambulatory Visit: Admitting: Physical Medicine and Rehabilitation

## 2024-09-27 ENCOUNTER — Encounter: Payer: Self-pay | Admitting: Physical Medicine and Rehabilitation

## 2024-09-27 DIAGNOSIS — M47816 Spondylosis without myelopathy or radiculopathy, lumbar region: Secondary | ICD-10-CM | POA: Diagnosis not present

## 2024-09-27 DIAGNOSIS — M5441 Lumbago with sciatica, right side: Secondary | ICD-10-CM

## 2024-09-27 DIAGNOSIS — G8929 Other chronic pain: Secondary | ICD-10-CM

## 2024-09-27 DIAGNOSIS — M48062 Spinal stenosis, lumbar region with neurogenic claudication: Secondary | ICD-10-CM

## 2024-09-27 DIAGNOSIS — M5442 Lumbago with sciatica, left side: Secondary | ICD-10-CM | POA: Diagnosis not present

## 2024-09-27 MED ORDER — HYDROCODONE-ACETAMINOPHEN 5-325 MG PO TABS: 1.0000 | ORAL_TABLET | Freq: Three times a day (TID) | ORAL | 0 refills | Status: AC | PRN

## 2024-09-27 NOTE — Progress Notes (Signed)
 "  William Wells - 89 y.o. male MRN [id number]  Date of birth: 04-20-1934  Office Visit Note: Visit Date: 09/27/2024 PCP: Shona Norleen PEDLAR, MD Referred by: Shona Norleen PEDLAR, MD  Subjective: Chief Complaint  Patient presents with   Lower Back - Pain   HPI: William Wells is a 89 y.o. male who comes in today for evaluation of chronic, worsening and severe bilateral lower back pain. Daughter is accompanying him during our visit today. Pain ongoing for several years, worsens with prolonged standing and walking. He describes pain as ripping and sore sensation, currently rates as 8 out of 10. Sitting seems to alleviate his pain. Some relief of pain with home exercise regimen, rest and use of medications. Lumbar MRI imaging from September of 2025 shows severe spinal canal stenosis at L4-L5 secondary to disc bulging and facet arthropathy. He underwent bilateral L4 transforaminal epidural steroid injection in our office on 07/05/2024. He reports greater than 80% relief of pain for over 2 months. Daughter reports increased functional ability post injection. Unfortunately, he suffered with heart attack and had cardiac pacemaker implanted on 08/18/2024. He is doing well and is now taking Plavix . Patient denies focal weakness. No recent trauma or falls. He is currently using cane to assist with ambulation.   Patients course is complicated by STEMI, pacemaker insertion, diabetes mellitus and chronic kidney disease.       Review of Systems  Musculoskeletal:  Positive for back pain.  Neurological:  Negative for tingling, sensory change, focal weakness and weakness.  All other systems reviewed and are negative.  Otherwise per HPI.  Assessment & Plan: Visit Diagnoses:    ICD-10-CM   1. Chronic bilateral low back pain with bilateral sciatica  M54.42 Ambulatory referral to Physical Medicine Rehab   M54.41    G89.29     2. Spinal stenosis of lumbar region with neurogenic claudication  M48.062 Ambulatory referral to  Physical Medicine Rehab    3. Facet arthropathy, lumbar  M47.816 Ambulatory referral to Physical Medicine Rehab       Plan: Findings:  Chronic, worsening and severe bilateral lower back pain. Patient continues to have severe pain despite good conservative therapies such as home exercise regimen, rest and use of medications. Patients clinical presentation and exam are consistent with neurogenic claudication as a result of spinal canal stenosis. There is severe spinal canal stenosis at the level of L4-L5 on recent lumbar MRI imaging. He did well with previous lumbar epidural steroid injections. Next step is to perform diagnostic and hopefully therapeutic bilateral L4 transforaminal epidural steroid injection under fluoroscopic guidance. We also discussed medication management, I prescribed short course of Norco for him to take while we are waiting on injection procedure. His exam today is non focal, good strength noted to bilateral lower extremities. No myelopathic symptoms noted.     Meds & Orders:  Meds ordered this encounter  Medications   HYDROcodone -acetaminophen  (NORCO/VICODIN) 5-325 MG tablet    Sig: Take 1 tablet by mouth every 8 (eight) hours as needed.    Dispense:  30 tablet    Refill:  0    Orders Placed This Encounter  Procedures   Ambulatory referral to Physical Medicine Rehab    Follow-up: Return for Bilateral L4 transforaminal epidural steroid injection.   Procedures: No procedures performed      Clinical History: CLINICAL DATA:  Worsening left leg pain   EXAM: MRI LUMBAR SPINE WITHOUT AND WITH CONTRAST   TECHNIQUE: Multiplanar and multiecho pulse  sequences of the lumbar spine were obtained without and with intravenous contrast.   CONTRAST:  8 mL of vueway  IV   COMPARISON:  MRI of the lumbar spine dated 02/25/2018   FINDINGS: Segmentation: Standard.   Alignment: Mild retrolisthesis of L1 on L2, L2 on L3, and L3 on L4. Grade 1 anterolisthesis of L4 on L5.  Levocurvature of the thoracolumbar spine.   Vertebrae: Degenerative endplate marrow changes at a few levels. No compression fractures.   Conus medullaris and cauda equina: The conus medullaris terminates at the level of L1-L2. The distal spinal cord signal intensity is normal. No suspicious enhancement.   Paraspinal and other soft tissues: Right renal cysts. The visualized aorta is normal.   Disc levels:   L1-L2: Disc bulge. Moderate bilateral facet arthropathy. Moderate right and mild left neuroforaminal stenosis. Mild spinal canal stenosis.   L2-L3: Disc bulge. Moderate bilateral facet arthropathy. Severe left and moderate right neuroforaminal stenosis. Moderate to severe spinal canal stenosis.   L3-L4: Disc bulge. Severe bilateral facet arthropathy. Severe left and moderate right neuroforaminal stenosis. Moderate spinal canal stenosis.   L4-L5: Disc bulge. Severe bilateral facet arthropathy. Severe bilateral neuroforaminal stenosis. Severe spinal canal stenosis.   L5-S1: Disc bulge. Severe bilateral facet arthropathy. Severe right and moderate left neuroforaminal stenosis. Mild spinal canal stenosis.   IMPRESSION: 1. Severe spinal canal stenosis at L4-L5 secondary to disc bulging and facet arthropathy. Moderate to severe spinal canal stenosis at L2-L3. 2. Severe neuroforaminal stenoses at L2-L3, L3-L4, L4-L5, and L5-S1 secondary to disc bulging and facet arthropathy.     Electronically Signed   By: Clem Savory M.D.   On: 06/14/2024 10:31   He reports that he quit smoking about 25 years ago. His smoking use included cigarettes. He started smoking about 73 years ago. He has a 23.5 pack-year smoking history. He has never used smokeless tobacco.  Recent Labs    08/05/24 1543  HGBA1C 7.1*    Objective:  VS:  HT:    WT:   BMI:     BP:   HR: bpm  TEMP: ( )  RESP:  Physical Exam Vitals and nursing note reviewed.  HENT:     Head: Normocephalic and atraumatic.      Right Ear: External ear normal.     Left Ear: External ear normal.     Nose: Nose normal.     Mouth/Throat:     Mouth: Mucous membranes are moist.  Eyes:     Extraocular Movements: Extraocular movements intact.  Cardiovascular:     Rate and Rhythm: Normal rate.     Pulses: Normal pulses.  Pulmonary:     Effort: Pulmonary effort is normal.  Abdominal:     General: Abdomen is flat. There is no distension.  Musculoskeletal:        General: Tenderness present.     Cervical back: Normal range of motion.     Comments: Patient is slow to rise from seated position to standing. Good lumbar range of motion. No pain noted with facet loading. 5/5 strength noted with bilateral hip flexion, knee flexion/extension, ankle dorsiflexion/plantarflexion and EHL. No clonus noted bilaterally. No pain upon palpation of greater trochanters. No pain with internal/external rotation of bilateral hips. Sensation intact bilaterally. Negative slump test bilaterally. Ambulates with cane, gait slow and unsteady.   Skin:    General: Skin is warm and dry.     Capillary Refill: Capillary refill takes less than 2 seconds.  Neurological:  Mental Status: He is alert and oriented to person, place, and time.     Gait: Gait abnormal.  Psychiatric:        Mood and Affect: Mood normal.        Behavior: Behavior normal.     Ortho Exam  Imaging: No results found.  Past Medical/Family/Surgical/Social History: Medications & Allergies reviewed per EMR, new medications updated. Patient Active Problem List   Diagnosis Date Noted   Cardiogenic shock (HCC) 08/23/2024   Type 2 diabetes mellitus with complication, with long-term current use of insulin  (HCC) 08/23/2024   Hypothyroidism 08/23/2024   Acute urinary retention 08/23/2024   Pressure injury of skin 08/18/2024   AKI (acute kidney injury) 08/15/2024   Acute ST elevation myocardial infarction (STEMI) of inferior wall (HCC) 08/05/2024   Radiculopathy, lumbar  region 06/08/2024   DOE (dyspnea on exertion) 01/05/2024   HLD (hyperlipidemia) 01/05/2024   AV block, Mobitz 2 01/05/2024   Headache 11/10/2023   Adenocarcinoma of left lung, stage 1 (HCC) 06/26/2020   Status post robot-assisted surgical procedure 06/22/2020   S/P robot-assisted surgical procedure 06/22/2020   Mass of upper lobe of left lung 04/11/2020   Monoclonal gammopathy of unknown significance (MGUS) 03/28/2020   Normocytic anemia 03/28/2020   Hyperkalemia 07/15/2019   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) 07/15/2019   Benign hypertension with chronic kidney disease 07/15/2019   Sciatica, left side 02/17/2018   Past Medical History:  Diagnosis Date   Acid reflux disease    Arthritis    Complication of anesthesia    appendix surgery (89 year old) - passed out once patient got back to floor unit   Diabetes mellitus    Dyspnea    Family history of adverse reaction to anesthesia    daughter states that she has a difficult time waking up   Hiatal hernia    High cholesterol    Hypertension    Hypothyroidism    Lung cancer (HCC)    s/p XI robotic assisted thoracoscopy for LU lobectomy 06/22/20   Monoclonal gammopathy of unknown significance (MGUS) 03/28/2020   Normocytic anemia 03/28/2020   Renal disorder    renal insufficiency   Type 2 diabetes mellitus (HCC)    Family History  Problem Relation Age of Onset   Diabetes Mother    Cancer Father    Cancer Sister    Diabetes Brother    Cancer Brother    Diabetes Brother    Parkinson's disease Brother    Diabetes Brother    Past Surgical History:  Procedure Laterality Date   APPENDECTOMY     ARTERY BIOPSY Right 11/23/2023   Procedure: RIGHT BIOPSY TEMPORAL ARTERY;  Surgeon: Gretta Lonni PARAS, MD;  [facility] OR;  Service: Vascular;  Laterality: Right;   BRONCHIAL BIOPSY  05/29/2020   Procedure: BRONCHIAL BIOPSIES;  Surgeon: Shelah Lamar RAMAN, MD;  Location: [facility] ENDOSCOPY;  Service: Pulmonary;;    BRONCHIAL BRUSHINGS  05/29/2020   Procedure: BRONCHIAL BRUSHINGS;  Surgeon: Shelah Lamar RAMAN, MD;  Location: [facility] ENDOSCOPY;  Service: Pulmonary;;   BRONCHIAL NEEDLE ASPIRATION BIOPSY  05/29/2020   Procedure: BRONCHIAL NEEDLE ASPIRATION BIOPSIES;  Surgeon: Shelah Lamar RAMAN, MD;  Location: [facility] ENDOSCOPY;  Service: Pulmonary;;   BRONCHIAL WASHINGS  05/29/2020   Procedure: BRONCHIAL WASHINGS;  Surgeon: Shelah Lamar RAMAN, MD;  Location: [facility] ENDOSCOPY;  Service: Pulmonary;;   CATARACT EXTRACTION W/PHACO Left 01/23/2023   Procedure: CATARACT EXTRACTION PHACO AND INTRAOCULAR LENS PLACEMENT (IOC);  Surgeon: Harrie,  Lynwood, MD;  Location: [facility] ORS;  Service: Ophthalmology;  Laterality: Left;  CDE: 13.69   CATARACT EXTRACTION W/PHACO Right 02/20/2023   Procedure: CATARACT EXTRACTION PHACO AND INTRAOCULAR LENS PLACEMENT (IOC);  Surgeon: Harrie Lynwood, MD;  Location: [facility] ORS;  Service: Ophthalmology;  Laterality: Right;  CDE: 12.88   CERVICAL SPINE SURGERY     EYE SURGERY     strabismus   INTERCOSTAL NERVE BLOCK Left 06/22/2020   Procedure: INTERCOSTAL NERVE BLOCK;  Surgeon: Kerrin Elspeth BROCKS, MD;  Location: [facility] OR;  Service: Thoracic;  Laterality: Left;   NODE DISSECTION Left 06/22/2020   Procedure: NODE DISSECTION;  Surgeon: Kerrin Elspeth BROCKS, MD;  Location: [facility] OR;  Service: Thoracic;  Laterality: Left;   PACEMAKER IMPLANT N/A 08/18/2024   Procedure: PACEMAKER IMPLANT;  Surgeon: Inocencio Soyla Lunger, MD;  Location: [facility] INVASIVE CV LAB;  Service: Cardiovascular;  Laterality: N/A;   TONSILLECTOMY     VIDEO BRONCHOSCOPY WITH ENDOBRONCHIAL NAVIGATION N/A 05/29/2020   Procedure: VIDEO BRONCHOSCOPY WITH ENDOBRONCHIAL NAVIGATION;  Surgeon: Shelah Lamar RAMAN, MD;  Location: [facility] ENDOSCOPY;  Service: Pulmonary;  Laterality: N/A;   Social History   Occupational History   Occupation: RETIRED  Tobacco Use   Smoking status: Former    Current packs/day: 0.00    Average packs/day: 0.5 packs/day for 47.0 years (23.5  ttl pk-yrs)    Types: Cigarettes    Start date: 81    Quit date: 2000    Years since quitting: 25.9   Smokeless tobacco: Never  Vaping Use   Vaping status: Never Used  Substance and Sexual Activity   Alcohol use: No   Drug use: No   Sexual activity: Not Currently    "

## 2024-09-27 NOTE — Progress Notes (Signed)
 Pain Scale   Average Pain 8 Patient advising he has lower back pain and pain is severe when getting up out of bed. Patient advising he has had several falls , patient also advising that the last injection in September helped with his back pain, Patient advising he now is on Plavix  and has a pacemaker after his Heart attack.        +Driver, -BT, -Dye Allergies.

## 2024-09-29 ENCOUNTER — Ambulatory Visit

## 2024-09-29 DIAGNOSIS — R0602 Shortness of breath: Secondary | ICD-10-CM | POA: Diagnosis not present

## 2024-09-30 ENCOUNTER — Ambulatory Visit: Payer: Self-pay | Admitting: Cardiology

## 2024-09-30 LAB — CUP PACEART REMOTE DEVICE CHECK
Battery Remaining Longevity: 138 mo
Battery Voltage: 3.2 V
Brady Statistic AP VP Percent: 1.77 %
Brady Statistic AP VS Percent: 0.01 %
Brady Statistic AS VP Percent: 94.74 %
Brady Statistic AS VS Percent: 3.49 %
Brady Statistic RA Percent Paced: 2.32 %
Brady Statistic RV Percent Paced: 96.5 %
Date Time Interrogation Session: 20251225024942
Implantable Lead Connection Status: 753985
Implantable Lead Connection Status: 753985
Implantable Lead Implant Date: 20251113
Implantable Lead Implant Date: 20251113
Implantable Lead Location: 753859
Implantable Lead Location: 753860
Implantable Lead Model: 3830
Implantable Lead Model: 5076
Implantable Pulse Generator Implant Date: 20251113
Lead Channel Impedance Value: 285 Ohm
Lead Channel Impedance Value: 361 Ohm
Lead Channel Impedance Value: 399 Ohm
Lead Channel Impedance Value: 551 Ohm
Lead Channel Pacing Threshold Amplitude: 0.5 V
Lead Channel Pacing Threshold Amplitude: 0.875 V
Lead Channel Pacing Threshold Pulse Width: 0.4 ms
Lead Channel Pacing Threshold Pulse Width: 0.4 ms
Lead Channel Sensing Intrinsic Amplitude: 17 mV
Lead Channel Sensing Intrinsic Amplitude: 17 mV
Lead Channel Sensing Intrinsic Amplitude: 2.875 mV
Lead Channel Sensing Intrinsic Amplitude: 2.875 mV
Lead Channel Setting Pacing Amplitude: 3.5 V
Lead Channel Setting Pacing Amplitude: 3.5 V
Lead Channel Setting Pacing Pulse Width: 0.4 ms
Lead Channel Setting Sensing Sensitivity: 0.9 mV
Zone Setting Status: 755011
Zone Setting Status: 755011

## 2024-09-30 NOTE — Progress Notes (Signed)
 Remote PPM Transmission

## 2024-10-14 ENCOUNTER — Ambulatory Visit: Admitting: Physical Medicine and Rehabilitation

## 2024-10-14 ENCOUNTER — Other Ambulatory Visit: Payer: Self-pay

## 2024-10-14 VITALS — BP 145/75 | HR 97

## 2024-10-14 DIAGNOSIS — M5416 Radiculopathy, lumbar region: Secondary | ICD-10-CM

## 2024-10-14 MED ORDER — METHYLPREDNISOLONE ACETATE 40 MG/ML IJ SUSP
40.0000 mg | Freq: Once | INTRAMUSCULAR | Status: AC
  Administered 2024-10-14: 40 mg

## 2024-10-14 NOTE — Progress Notes (Signed)
 Pain Scale   Average Pain 8 Patient advising he has chronic lower back pain that radiates bilaterally to legs pain is constant        +Driver, -BT, -Dye Allergies.

## 2024-10-14 NOTE — Progress Notes (Signed)
 "  William Wells - 89 y.o. male MRN [id number]  Date of birth: 06/17/1934  Office Visit Note: Visit Date: 10/14/2024 PCP: Shona Norleen PEDLAR, MD Referred by: Shona Norleen PEDLAR, MD  Subjective: Chief Complaint  Patient presents with   Lower Back - Pain   HPI:  William Wells is a 89 y.o. male who comes in today at the request of Duwaine Pouch, FNP for planned Bilateral L4-5 Lumbar Transforaminal epidural steroid injection with fluoroscopic guidance.  The patient has failed conservative care including home exercise, medications, time and activity modification.  This injection will be diagnostic and hopefully therapeutic.  Please see requesting physician notes for further details and justification.   ROS Otherwise per HPI.  Assessment & Plan: Visit Diagnoses:    ICD-10-CM   1. Lumbar radiculopathy  M54.16 XR C-ARM NO REPORT    Epidural Steroid injection    methylPREDNISolone  acetate (DEPO-MEDROL ) injection 40 mg      Plan: No additional findings.   Meds & Orders:  Meds ordered this encounter  Medications   methylPREDNISolone  acetate (DEPO-MEDROL ) injection 40 mg    Orders Placed This Encounter  Procedures   XR C-ARM NO REPORT   Epidural Steroid injection    Follow-up: Return for visit to requesting provider as needed.   Procedures: No procedures performed  Lumbosacral Transforaminal Epidural Steroid Injection - Sub-Pedicular Approach with Fluoroscopic Guidance  Patient: William Wells      Date of Birth: [birth year] MRN: [id number] PCP: Shona Norleen PEDLAR, MD      Visit Date: 10/14/2024   Universal Protocol:    Date/Time: 10/14/2024  Consent Given By: the patient  Position: PRONE  Additional Comments: Vital signs were monitored before and after the procedure. Patient was prepped and draped in the usual sterile fashion. The correct patient, procedure, and site was verified.   Injection Procedure Details:   Procedure diagnoses: Lumbar radiculopathy [M54.16]    Meds Administered:   Meds ordered this encounter  Medications   methylPREDNISolone  acetate (DEPO-MEDROL ) injection 40 mg    Laterality: Bilateral  Location/Site: L4  Needle:5.0 in., 22 ga.  Short bevel or Quincke spinal needle  Needle Placement: Transforaminal  Findings:    -Comments: Excellent flow of contrast along the nerve, nerve root and into the epidural space.  Procedure Details: After squaring off the end-plates to get a true AP view, the C-arm was positioned so that an oblique view of the foramen as noted above was visualized. The target area is just inferior to the nose of the scotty dog or sub pedicular. The soft tissues overlying this structure were infiltrated with 2-3 ml. of 1% Lidocaine  without Epinephrine .  The spinal needle was inserted toward the target using a trajectory view along the fluoroscope beam.  Under AP and lateral visualization, the needle was advanced so it did not puncture dura and was located close the 6 O'Clock position of the pedical in AP tracterory. Biplanar projections were used to confirm position. Aspiration was confirmed to be negative for CSF and/or blood. A 1-2 ml. volume of Isovue -250 was injected and flow of contrast was noted at each level. Radiographs were obtained for documentation purposes.   After attaining the desired flow of contrast documented above, a 0.5 to 1.0 ml test dose of 0.25% Marcaine  was injected into each respective transforaminal space.  The patient was observed for 90 seconds post injection.  After no sensory deficits were reported, and normal lower extremity motor function was noted,   the above  injectate was administered so that equal amounts of the injectate were placed at each foramen (level) into the transforaminal epidural space.   Additional Comments:  The patient tolerated the procedure well Dressing: 2 x 2 sterile gauze and Band-Aid    Post-procedure details: Patient was observed during the procedure. Post-procedure  instructions were reviewed.  Patient left the clinic in stable condition.    Clinical History: CLINICAL DATA:  Worsening left leg pain   EXAM: MRI LUMBAR SPINE WITHOUT AND WITH CONTRAST   TECHNIQUE: Multiplanar and multiecho pulse sequences of the lumbar spine were obtained without and with intravenous contrast.   CONTRAST:  8 mL of vueway  IV   COMPARISON:  MRI of the lumbar spine dated 02/25/2018   FINDINGS: Segmentation: Standard.   Alignment: Mild retrolisthesis of L1 on L2, L2 on L3, and L3 on L4. Grade 1 anterolisthesis of L4 on L5. Levocurvature of the thoracolumbar spine.   Vertebrae: Degenerative endplate marrow changes at a few levels. No compression fractures.   Conus medullaris and cauda equina: The conus medullaris terminates at the level of L1-L2. The distal spinal cord signal intensity is normal. No suspicious enhancement.   Paraspinal and other soft tissues: Right renal cysts. The visualized aorta is normal.   Disc levels:   L1-L2: Disc bulge. Moderate bilateral facet arthropathy. Moderate right and mild left neuroforaminal stenosis. Mild spinal canal stenosis.   L2-L3: Disc bulge. Moderate bilateral facet arthropathy. Severe left and moderate right neuroforaminal stenosis. Moderate to severe spinal canal stenosis.   L3-L4: Disc bulge. Severe bilateral facet arthropathy. Severe left and moderate right neuroforaminal stenosis. Moderate spinal canal stenosis.   L4-L5: Disc bulge. Severe bilateral facet arthropathy. Severe bilateral neuroforaminal stenosis. Severe spinal canal stenosis.   L5-S1: Disc bulge. Severe bilateral facet arthropathy. Severe right and moderate left neuroforaminal stenosis. Mild spinal canal stenosis.   IMPRESSION: 1. Severe spinal canal stenosis at L4-L5 secondary to disc bulging and facet arthropathy. Moderate to severe spinal canal stenosis at L2-L3. 2. Severe neuroforaminal stenoses at L2-L3, L3-L4, L4-L5, and  L5-S1 secondary to disc bulging and facet arthropathy.     Electronically Signed   By: Clem Savory M.D.   On: 06/14/2024 10:31     Objective:  VS:  HT:    WT:   BMI:     BP:(!) 145/75  HR:97bpm  TEMP: ( )  RESP:  Physical Exam Vitals and nursing note reviewed.  Constitutional:      General: He is not in acute distress.    Appearance: Normal appearance. He is not ill-appearing.  HENT:     Head: Normocephalic and atraumatic.     Right Ear: External ear normal.     Left Ear: External ear normal.     Nose: No congestion.  Eyes:     Extraocular Movements: Extraocular movements intact.  Cardiovascular:     Rate and Rhythm: Normal rate.     Pulses: Normal pulses.  Pulmonary:     Effort: Pulmonary effort is normal. No respiratory distress.  Abdominal:     General: There is no distension.     Palpations: Abdomen is soft.  Musculoskeletal:        General: No tenderness or signs of injury.     Cervical back: Neck supple.     Right lower leg: No edema.     Left lower leg: No edema.     Comments: Patient has good distal strength without clonus.  Skin:    Findings: No erythema or  rash.  Neurological:     General: No focal deficit present.     Mental Status: He is alert and oriented to person, place, and time.     Sensory: No sensory deficit.     Motor: No weakness or abnormal muscle tone.     Coordination: Coordination normal.  Psychiatric:        Mood and Affect: Mood normal.        Behavior: Behavior normal.      Imaging: No results found. "

## 2024-10-14 NOTE — Procedures (Signed)
 Lumbosacral Transforaminal Epidural Steroid Injection - Sub-Pedicular Approach with Fluoroscopic Guidance  Patient: William Wells      Date of Birth: [birth year] MRN: [id number] PCP: Shona Norleen PEDLAR, MD      Visit Date: 10/14/2024   Universal Protocol:    Date/Time: 10/14/2024  Consent Given By: the patient  Position: PRONE  Additional Comments: Vital signs were monitored before and after the procedure. Patient was prepped and draped in the usual sterile fashion. The correct patient, procedure, and site was verified.   Injection Procedure Details:   Procedure diagnoses: Lumbar radiculopathy [M54.16]    Meds Administered:  Meds ordered this encounter  Medications   methylPREDNISolone  acetate (DEPO-MEDROL ) injection 40 mg    Laterality: Bilateral  Location/Site: L4  Needle:5.0 in., 22 ga.  Short bevel or Quincke spinal needle  Needle Placement: Transforaminal  Findings:    -Comments: Excellent flow of contrast along the nerve, nerve root and into the epidural space.  Procedure Details: After squaring off the end-plates to get a true AP view, the C-arm was positioned so that an oblique view of the foramen as noted above was visualized. The target area is just inferior to the nose of the scotty dog or sub pedicular. The soft tissues overlying this structure were infiltrated with 2-3 ml. of 1% Lidocaine  without Epinephrine .  The spinal needle was inserted toward the target using a trajectory view along the fluoroscope beam.  Under AP and lateral visualization, the needle was advanced so it did not puncture dura and was located close the 6 O'Clock position of the pedical in AP tracterory. Biplanar projections were used to confirm position. Aspiration was confirmed to be negative for CSF and/or blood. A 1-2 ml. volume of Isovue -250 was injected and flow of contrast was noted at each level. Radiographs were obtained for documentation purposes.   After attaining the desired flow  of contrast documented above, a 0.5 to 1.0 ml test dose of 0.25% Marcaine  was injected into each respective transforaminal space.  The patient was observed for 90 seconds post injection.  After no sensory deficits were reported, and normal lower extremity motor function was noted,   the above injectate was administered so that equal amounts of the injectate were placed at each foramen (level) into the transforaminal epidural space.   Additional Comments:  The patient tolerated the procedure well Dressing: 2 x 2 sterile gauze and Band-Aid    Post-procedure details: Patient was observed during the procedure. Post-procedure instructions were reviewed.  Patient left the clinic in stable condition.

## 2024-10-28 ENCOUNTER — Encounter (HOSPITAL_COMMUNITY): Payer: Self-pay

## 2024-10-28 ENCOUNTER — Inpatient Hospital Stay (HOSPITAL_COMMUNITY)
Admission: EM | Admit: 2024-10-28 | Discharge: 2024-10-31 | DRG: 194 | Disposition: A | Attending: Internal Medicine | Admitting: Internal Medicine

## 2024-10-28 ENCOUNTER — Emergency Department (HOSPITAL_COMMUNITY)

## 2024-10-28 ENCOUNTER — Other Ambulatory Visit: Payer: Self-pay

## 2024-10-28 DIAGNOSIS — I129 Hypertensive chronic kidney disease with stage 1 through stage 4 chronic kidney disease, or unspecified chronic kidney disease: Secondary | ICD-10-CM | POA: Diagnosis present

## 2024-10-28 DIAGNOSIS — K449 Diaphragmatic hernia without obstruction or gangrene: Secondary | ICD-10-CM | POA: Diagnosis present

## 2024-10-28 DIAGNOSIS — Z833 Family history of diabetes mellitus: Secondary | ICD-10-CM | POA: Diagnosis not present

## 2024-10-28 DIAGNOSIS — N1832 Chronic kidney disease, stage 3b: Secondary | ICD-10-CM | POA: Diagnosis present

## 2024-10-28 DIAGNOSIS — E039 Hypothyroidism, unspecified: Secondary | ICD-10-CM | POA: Diagnosis present

## 2024-10-28 DIAGNOSIS — Z79890 Hormone replacement therapy (postmenopausal): Secondary | ICD-10-CM

## 2024-10-28 DIAGNOSIS — C3492 Malignant neoplasm of unspecified part of left bronchus or lung: Secondary | ICD-10-CM | POA: Diagnosis present

## 2024-10-28 DIAGNOSIS — D631 Anemia in chronic kidney disease: Secondary | ICD-10-CM | POA: Diagnosis present

## 2024-10-28 DIAGNOSIS — M7989 Other specified soft tissue disorders: Secondary | ICD-10-CM | POA: Diagnosis not present

## 2024-10-28 DIAGNOSIS — Z7902 Long term (current) use of antithrombotics/antiplatelets: Secondary | ICD-10-CM | POA: Diagnosis not present

## 2024-10-28 DIAGNOSIS — J439 Emphysema, unspecified: Secondary | ICD-10-CM | POA: Diagnosis present

## 2024-10-28 DIAGNOSIS — Z902 Acquired absence of lung [part of]: Secondary | ICD-10-CM | POA: Diagnosis not present

## 2024-10-28 DIAGNOSIS — Z79899 Other long term (current) drug therapy: Secondary | ICD-10-CM

## 2024-10-28 DIAGNOSIS — E118 Type 2 diabetes mellitus with unspecified complications: Secondary | ICD-10-CM | POA: Diagnosis not present

## 2024-10-28 DIAGNOSIS — L89321 Pressure ulcer of left buttock, stage 1: Secondary | ICD-10-CM | POA: Diagnosis present

## 2024-10-28 DIAGNOSIS — Z66 Do not resuscitate: Secondary | ICD-10-CM | POA: Diagnosis present

## 2024-10-28 DIAGNOSIS — Z794 Long term (current) use of insulin: Secondary | ICD-10-CM

## 2024-10-28 DIAGNOSIS — Z85118 Personal history of other malignant neoplasm of bronchus and lung: Secondary | ICD-10-CM

## 2024-10-28 DIAGNOSIS — B9789 Other viral agents as the cause of diseases classified elsewhere: Secondary | ICD-10-CM | POA: Diagnosis present

## 2024-10-28 DIAGNOSIS — Z8249 Family history of ischemic heart disease and other diseases of the circulatory system: Secondary | ICD-10-CM | POA: Diagnosis not present

## 2024-10-28 DIAGNOSIS — Z87891 Personal history of nicotine dependence: Secondary | ICD-10-CM | POA: Diagnosis not present

## 2024-10-28 DIAGNOSIS — M1990 Unspecified osteoarthritis, unspecified site: Secondary | ICD-10-CM | POA: Diagnosis present

## 2024-10-28 DIAGNOSIS — N400 Enlarged prostate without lower urinary tract symptoms: Secondary | ICD-10-CM | POA: Diagnosis present

## 2024-10-28 DIAGNOSIS — J90 Pleural effusion, not elsewhere classified: Secondary | ICD-10-CM | POA: Diagnosis present

## 2024-10-28 DIAGNOSIS — E1122 Type 2 diabetes mellitus with diabetic chronic kidney disease: Secondary | ICD-10-CM | POA: Diagnosis present

## 2024-10-28 DIAGNOSIS — I2510 Atherosclerotic heart disease of native coronary artery without angina pectoris: Secondary | ICD-10-CM | POA: Diagnosis present

## 2024-10-28 DIAGNOSIS — Z950 Presence of cardiac pacemaker: Secondary | ICD-10-CM | POA: Diagnosis not present

## 2024-10-28 DIAGNOSIS — L89311 Pressure ulcer of right buttock, stage 1: Secondary | ICD-10-CM | POA: Diagnosis present

## 2024-10-28 DIAGNOSIS — E7800 Pure hypercholesterolemia, unspecified: Secondary | ICD-10-CM | POA: Diagnosis present

## 2024-10-28 DIAGNOSIS — J189 Pneumonia, unspecified organism: Principal | ICD-10-CM | POA: Diagnosis present

## 2024-10-28 DIAGNOSIS — J941 Fibrothorax: Secondary | ICD-10-CM | POA: Diagnosis present

## 2024-10-28 DIAGNOSIS — Z7984 Long term (current) use of oral hypoglycemic drugs: Secondary | ICD-10-CM | POA: Diagnosis not present

## 2024-10-28 DIAGNOSIS — I2489 Other forms of acute ischemic heart disease: Secondary | ICD-10-CM | POA: Diagnosis present

## 2024-10-28 DIAGNOSIS — Z820 Family history of epilepsy and other diseases of the nervous system: Secondary | ICD-10-CM

## 2024-10-28 DIAGNOSIS — R609 Edema, unspecified: Secondary | ICD-10-CM | POA: Diagnosis not present

## 2024-10-28 DIAGNOSIS — Z7982 Long term (current) use of aspirin: Secondary | ICD-10-CM

## 2024-10-28 LAB — CBC
HCT: 32 % — ABNORMAL LOW (ref 39.0–52.0)
Hemoglobin: 10.2 g/dL — ABNORMAL LOW (ref 13.0–17.0)
MCH: 28.4 pg (ref 26.0–34.0)
MCHC: 31.9 g/dL (ref 30.0–36.0)
MCV: 89.1 fL (ref 80.0–100.0)
Platelets: 221 K/uL (ref 150–400)
RBC: 3.59 MIL/uL — ABNORMAL LOW (ref 4.22–5.81)
RDW: 15 % (ref 11.5–15.5)
WBC: 7.9 K/uL (ref 4.0–10.5)
nRBC: 0 % (ref 0.0–0.2)

## 2024-10-28 LAB — BASIC METABOLIC PANEL WITH GFR
Anion gap: 11 (ref 5–15)
BUN: 30 mg/dL — ABNORMAL HIGH (ref 8–23)
CO2: 26 mmol/L (ref 22–32)
Calcium: 9.5 mg/dL (ref 8.9–10.3)
Chloride: 103 mmol/L (ref 98–111)
Creatinine, Ser: 2 mg/dL — ABNORMAL HIGH (ref 0.61–1.24)
GFR, Estimated: 32 mL/min — ABNORMAL LOW
Glucose, Bld: 104 mg/dL — ABNORMAL HIGH (ref 70–99)
Potassium: 4.8 mmol/L (ref 3.5–5.1)
Sodium: 140 mmol/L (ref 135–145)

## 2024-10-28 MED ORDER — SODIUM CHLORIDE 0.9 % IV SOLN
1.0000 g | Freq: Once | INTRAVENOUS | Status: AC
  Administered 2024-10-28: 1 g via INTRAVENOUS
  Filled 2024-10-28: qty 10

## 2024-10-28 MED ORDER — DOXYCYCLINE HYCLATE 100 MG PO TABS
100.0000 mg | ORAL_TABLET | Freq: Once | ORAL | Status: AC
  Administered 2024-10-28: 100 mg via ORAL
  Filled 2024-10-28: qty 1

## 2024-10-28 NOTE — ED Triage Notes (Signed)
 Quick triage note: Pt to ED accompanied with daughter from UC c/o pneumonia. Pt dx with pneumonia on Monday and feels not getting better, continues to cough, intermittent SHOB,  Reports completed z pack today, taking Augmentin and finishes with that Sunday.

## 2024-10-28 NOTE — ED Provider Notes (Signed)
 " [place] [facility] Provider Note   CSN: [id number] Arrival date & time: 10/28/24  2027     Patient presents with: Pneumonia   William Wells is a 89 y.o. male presenting from home with productive cough and generalized weakness ongoing for 5 days.  His daughter at bedside reports he went to urgent care and was started on Augmentin as well as azithromycin 5 days ago.  They have been taking his medicine regularly, but the patient continues to have weakening status at home.  Difficulty walking and feeling weak.  Continues to have productive coughing.  No fevers at home.  Tested negative for flu and COVID at urgent care.  He reportedly does have a history of a left upper lobectomy due to lung cancer in the past at this time and in remission   HPI     Prior to Admission medications  Medication Sig Start Date End Date Taking? Authorizing Provider  acetaminophen  (TYLENOL ) 500 MG tablet Take 2 tablets (1,000 mg total) by mouth every 6 (six) hours. Patient taking differently: Take 500 mg by mouth daily as needed for headache, moderate pain (pain score 4-6) or mild pain (pain score 1-3). 06/26/20   Lucien Orren SAILOR, PA-C  albuterol  (VENTOLIN  HFA) 108 (90 Base) MCG/ACT inhaler INAHLE 2 PUFFS BY MOUTH 4 TIMES A DAY AS NEEDED FOR COUGH/WHEEZING    [provider]  aspirin  EC 81 MG tablet Take 1 tablet (81 mg total) by mouth daily. Swallow whole. 08/24/24   Henry Manuelita NOVAK, NP  atorvastatin  (LIPITOR ) 80 MG tablet Take 1 tablet (80 mg total) by mouth daily. 09/08/24   Emelia Josefa HERO, NP  clopidogrel  (PLAVIX ) 75 MG tablet Take 1 tablet (75 mg total) by mouth daily. 09/08/24   Emelia Josefa HERO, NP  diclofenac  Sodium (VOLTAREN ) 1 % GEL Apply 1 Application topically daily as needed (pain).    [provider]  docusate sodium  (COLACE) 100 MG capsule Take 1 capsule (100 mg total) by mouth 2 (two) times daily as needed for mild constipation. Patient  taking differently: Take 100 mg by mouth 2 (two) times a week. 06/26/20   Conte, Tessa N, PA-C  ferrous sulfate 325 (65 FE) MG tablet Take 325 mg by mouth every Monday, Wednesday, and Friday.    [provider]  gabapentin  (NEURONTIN ) 300 MG capsule Take 1 capsule (300 mg total) by mouth 2 (two) times daily. 08/23/24   Akula, Vijaya, MD  glipiZIDE  (GLUCOTROL  XL) 2.5 MG 24 hr tablet Take 1.25 mg by mouth daily as needed (blood sugar above 160). 03/20/20   [provider]  guaiFENesin  (MUCINEX ) 600 MG 12 hr tablet Take 600 mg by mouth 2 (two) times daily.    [provider]  HYDROcodone -acetaminophen  (NORCO/VICODIN) 5-325 MG tablet Take 1 tablet by mouth every 8 (eight) hours as needed. 09/27/24   Williams, Megan E, NP  insulin  aspart (NOVOLOG ) 100 UNIT/ML injection CBG 70 - 120: 0 units  CBG 121 - 150: 2 units  CBG 151 - 200: 3 units  CBG 201 - 250: 5 units  CBG 251 - 300: 8 units  CBG 301 - 350: 11 units  CBG 351 - 400: 15 units 08/23/24   Akula, Vijaya, MD  levothyroxine  (SYNTHROID ) 75 MCG tablet Take 75 mcg by mouth at bedtime. 05/23/21   [provider]  LOKELMA  5 g packet Take 5 g by mouth every Monday, Wednesday, and Friday. 10/08/20   [provider]  Multiple Vitamin (MULTIVITAMIN ADULT PO) Take 1 tablet by mouth at bedtime.    [provider]  Multiple Vitamins-Minerals (PRESERVISION AREDS PO) Take 1 tablet by mouth in the morning and at bedtime.    [provider]  mupirocin ointment (BACTROBAN) 2 % Apply 1 Application topically 3 (three) times daily. 06/30/24   [provider]  nitroGLYCERIN  (NITROSTAT ) 0.4 MG SL tablet Place 1 tablet (0.4 mg total) under the tongue every 5 (five) minutes as needed for chest pain. 09/16/24   Ladona Heinz, MD  polyethylene glycol (MIRALAX  / GLYCOLAX ) 17 g packet Take 17 g by mouth daily as needed (Constipation). 08/23/24   Akula, Vijaya, MD  tamsulosin  (FLOMAX ) 0.4 MG CAPS capsule Take 1  capsule (0.4 mg total) by mouth daily. 08/24/24   Cherlyn Labella, MD  VITAMIN D  PO Take 2 capsules by mouth at bedtime.    [provider]  VITAMIN E PO Take 1 capsule by mouth at bedtime.    [provider]    Allergies: Patient has no known allergies.    Review of Systems  Updated Vital Signs BP 139/80 (BP Location: Right Arm)   Pulse 79   Temp 97.9 F (36.6 C)   Resp 20   Ht 5' 8 (1.727 m)   Wt 81.2 kg   SpO2 100%   BMI 27.22 kg/m   Physical Exam Constitutional:      General: He is not in acute distress. HENT:     Head: Normocephalic and atraumatic.  Eyes:     Conjunctiva/sclera: Conjunctivae normal.     Pupils: Pupils are equal, round, and reactive to light.  Cardiovascular:     Rate and Rhythm: Normal rate and regular rhythm.  Pulmonary:     Effort: Pulmonary effort is normal. No respiratory distress.     Comments: Rhonchi in the left lower lung Abdominal:     General: There is no distension.     Tenderness: There is no abdominal tenderness.  Skin:    General: Skin is warm and dry.  Neurological:     General: No focal deficit present.     Mental Status: He is alert. Mental status is at baseline.  Psychiatric:        Mood and Affect: Mood normal.        Behavior: Behavior normal.     (all labs ordered are listed, but only abnormal results are displayed) Labs Reviewed  BASIC METABOLIC PANEL WITH GFR - Abnormal; Notable for the following components:      Result Value   Glucose, Bld 104 (*)    BUN 30 (*)    Creatinine, Ser 2.00 (*)    GFR, Estimated 32 (*)    All other components within normal limits  CBC - Abnormal; Notable for the following components:   RBC 3.59 (*)    Hemoglobin 10.2 (*)    HCT 32.0 (*)    All other components within normal limits    EKG: EKG Interpretation Date/Time:  Friday October 28 2024 21:07:08 EST Ventricular Rate:  76 PR Interval:    QRS Duration:  112 QT Interval:  386 QTC Calculation: 434 R  Axis:   15  Text Interpretation: Ventricular-paced rhythm with occasional Premature ventricular complexes Abnormal ECG When compared with ECG of 08-Sep-2024 14:47, PREVIOUS ECG IS PRESENT Confirmed by Cottie Cough [id number]) on 10/28/2024 9:33:20 PM  Radiology: [facility] Chest 2 View Result Date: 10/28/2024 EXAM: 2 VIEW(S) XRAY OF THE CHEST 10/28/2024 09:05:00  PM COMPARISON: 09/14/2024 CLINICAL HISTORY: Shortness of breath. FINDINGS: LINES, TUBES AND DEVICES: Left upper chest pacemaker in place. LUNGS AND PLEURA: Persistent left lung base opacity with left pleural effusion. No pneumothorax. HEART AND MEDIASTINUM: Calcified tortuous aorta. No acute abnormality of the cardiac silhouette. BONES AND SOFT TISSUES: Left thoracotomy noted. Degenerative changes of spine. No acute osseous abnormality. IMPRESSION: 1. Persistent left lung base opacity with left pleural effusion. Electronically signed by: Oneil Devonshire MD 10/28/2024 09:12 PM EST RP Workstation: [id number]     Procedures   Medications Ordered in the ED  cefTRIAXone  (ROCEPHIN ) 1 g in sodium chloride  0.9 % 100 mL IVPB (has no administration in time range)  doxycycline  (VIBRA -TABS) tablet 100 mg (has no administration in time range)                                    Medical Decision Making Amount and/or Complexity of Data Reviewed Labs: ordered. Radiology: ordered.  Risk Prescription drug management. Decision regarding hospitalization.   Elderly patient here with productive cough and weakness ongoing for 5 days.  Differential would include pneumonia versus viral infection versus anemia versus pleural effusion versus malignancy versus other  Supplemental history provided by the patient's daughter at bedside.  Risk factor comorbidity includes advanced age.  Patient has an elevated curb 65 score due to elevated age and BUN level = score 2.  There is also concern that he may have failed outpatient antibiotics, although I explained that is  also possible that these antibiotics do not be had a time to take full effect.  However, given his reported worsening weakening, I think we will admit to the hospital is the safest plan of action.  I have ordered CT imaging to better evaluate the left lower lobe infiltrate and possible pleural effusion noted on the x-ray, as I wonder if there may be some concern for malignancy?  I did review the patient's basic labs.  Chronic kidney disease at baseline levels creatinine level.  White blood cell count is normal.  Low suspicion for sepsis.  IV antibiotics ordered Rocephin  and doxycycline  to switch class.     Final diagnoses:  Pneumonia due to infectious organism, unspecified laterality, unspecified part of lung    ED Discharge Orders     None          Cottie Donnice PARAS, MD 10/28/24 [id number]  "

## 2024-10-28 NOTE — ED Notes (Signed)
 Provider at bedside.

## 2024-10-28 NOTE — ED Triage Notes (Signed)
 Pt states he was dx with pneumonia on Monday was given 2 antibiotics, one runs out today & the other tomorrow. Pt sates he doesn't think he is getting any better. Reports increased weakness, coughing & fatigue.

## 2024-10-29 ENCOUNTER — Inpatient Hospital Stay (HOSPITAL_COMMUNITY)

## 2024-10-29 ENCOUNTER — Encounter (HOSPITAL_COMMUNITY): Payer: Self-pay | Admitting: Family Medicine

## 2024-10-29 DIAGNOSIS — J90 Pleural effusion, not elsewhere classified: Secondary | ICD-10-CM | POA: Diagnosis present

## 2024-10-29 DIAGNOSIS — M7989 Other specified soft tissue disorders: Secondary | ICD-10-CM | POA: Diagnosis present

## 2024-10-29 DIAGNOSIS — J189 Pneumonia, unspecified organism: Secondary | ICD-10-CM | POA: Diagnosis not present

## 2024-10-29 LAB — STREP PNEUMONIAE URINARY ANTIGEN: Strep Pneumo Urinary Antigen: NEGATIVE

## 2024-10-29 LAB — HEPATIC FUNCTION PANEL
ALT: 33 U/L (ref 0–44)
AST: 21 U/L (ref 15–41)
Albumin: 3.3 g/dL — ABNORMAL LOW (ref 3.5–5.0)
Alkaline Phosphatase: 65 U/L (ref 38–126)
Bilirubin, Direct: 0.1 mg/dL (ref 0.0–0.2)
Indirect Bilirubin: 0.2 mg/dL — ABNORMAL LOW (ref 0.3–0.9)
Total Bilirubin: 0.3 mg/dL (ref 0.0–1.2)
Total Protein: 6.8 g/dL (ref 6.5–8.1)

## 2024-10-29 LAB — GLUCOSE, CAPILLARY
Glucose-Capillary: 138 mg/dL — ABNORMAL HIGH (ref 70–99)
Glucose-Capillary: 129 mg/dL — ABNORMAL HIGH (ref 70–99)
Glucose-Capillary: 149 mg/dL — ABNORMAL HIGH (ref 70–99)

## 2024-10-29 LAB — BASIC METABOLIC PANEL WITH GFR
Anion gap: 10 (ref 5–15)
BUN: 27 mg/dL — ABNORMAL HIGH (ref 8–23)
CO2: 25 mmol/L (ref 22–32)
Calcium: 9.1 mg/dL (ref 8.9–10.3)
Chloride: 105 mmol/L (ref 98–111)
Creatinine, Ser: 1.78 mg/dL — ABNORMAL HIGH (ref 0.61–1.24)
GFR, Estimated: 36 mL/min — ABNORMAL LOW
Glucose, Bld: 118 mg/dL — ABNORMAL HIGH (ref 70–99)
Potassium: 4.1 mmol/L (ref 3.5–5.1)
Sodium: 139 mmol/L (ref 135–145)

## 2024-10-29 LAB — CBC
HCT: 27.6 % — ABNORMAL LOW (ref 39.0–52.0)
Hemoglobin: 9.1 g/dL — ABNORMAL LOW (ref 13.0–17.0)
MCH: 28.6 pg (ref 26.0–34.0)
MCHC: 33 g/dL (ref 30.0–36.0)
MCV: 86.8 fL (ref 80.0–100.0)
Platelets: 188 10*3/uL (ref 150–400)
RBC: 3.18 MIL/uL — ABNORMAL LOW (ref 4.22–5.81)
RDW: 15 % (ref 11.5–15.5)
WBC: 7.4 10*3/uL (ref 4.0–10.5)
nRBC: 0 % (ref 0.0–0.2)

## 2024-10-29 LAB — CBG MONITORING, ED
Glucose-Capillary: 97 mg/dL (ref 70–99)
Glucose-Capillary: 125 mg/dL — ABNORMAL HIGH (ref 70–99)

## 2024-10-29 LAB — LACTATE DEHYDROGENASE: LDH: 111 U/L (ref 105–235)

## 2024-10-29 MED ORDER — ATORVASTATIN CALCIUM 80 MG PO TABS
80.0000 mg | ORAL_TABLET | Freq: Every day | ORAL | Status: DC
  Administered 2024-10-29 – 2024-10-31 (×3): 80 mg via ORAL
  Filled 2024-10-29 (×3): qty 1

## 2024-10-29 MED ORDER — ENOXAPARIN SODIUM 40 MG/0.4ML IJ SOSY
40.0000 mg | PREFILLED_SYRINGE | INTRAMUSCULAR | Status: DC
  Administered 2024-10-30: 40 mg via SUBCUTANEOUS
  Filled 2024-10-29 (×2): qty 0.4

## 2024-10-29 MED ORDER — SODIUM CHLORIDE 0.9 % IV SOLN
2.0000 g | INTRAVENOUS | Status: DC
  Administered 2024-10-29 – 2024-10-31 (×3): 2 g via INTRAVENOUS
  Filled 2024-10-29 (×3): qty 20

## 2024-10-29 MED ORDER — ASPIRIN 81 MG PO TBEC
81.0000 mg | DELAYED_RELEASE_TABLET | Freq: Every day | ORAL | Status: DC
  Administered 2024-10-29 – 2024-10-31 (×3): 81 mg via ORAL
  Filled 2024-10-29 (×3): qty 1

## 2024-10-29 MED ORDER — TAMSULOSIN HCL 0.4 MG PO CAPS
0.4000 mg | ORAL_CAPSULE | Freq: Every day | ORAL | Status: DC
  Administered 2024-10-29 – 2024-10-31 (×3): 0.4 mg via ORAL
  Filled 2024-10-29 (×3): qty 1

## 2024-10-29 MED ORDER — GUAIFENESIN 100 MG/5ML PO LIQD: 5.0000 mL | ORAL | Status: DC | PRN

## 2024-10-29 MED ORDER — CLOPIDOGREL BISULFATE 75 MG PO TABS
75.0000 mg | ORAL_TABLET | Freq: Every day | ORAL | Status: DC
  Administered 2024-10-29 – 2024-10-31 (×3): 75 mg via ORAL
  Filled 2024-10-29 (×3): qty 1

## 2024-10-29 MED ORDER — FINASTERIDE 5 MG PO TABS
5.0000 mg | ORAL_TABLET | Freq: Every day | ORAL | Status: DC
  Administered 2024-10-29 – 2024-10-31 (×3): 5 mg via ORAL
  Filled 2024-10-29 (×3): qty 1

## 2024-10-29 MED ORDER — DEXTROSE-SODIUM CHLORIDE 5-0.45 % IV SOLN: INTRAVENOUS | Status: AC

## 2024-10-29 MED ORDER — GABAPENTIN 300 MG PO CAPS
300.0000 mg | ORAL_CAPSULE | Freq: Two times a day (BID) | ORAL | Status: DC
  Administered 2024-10-29 – 2024-10-31 (×5): 300 mg via ORAL
  Filled 2024-10-29 (×5): qty 1

## 2024-10-29 MED ORDER — OXYCODONE HCL 5 MG PO TABS: 2.5000 mg | ORAL_TABLET | ORAL | Status: DC | PRN

## 2024-10-29 MED ORDER — DICLOFENAC SODIUM 1 % EX GEL: 1.0000 | Freq: Every day | CUTANEOUS | Status: DC | PRN

## 2024-10-29 MED ORDER — PROCHLORPERAZINE EDISYLATE 10 MG/2ML IJ SOLN: 5.0000 mg | Freq: Four times a day (QID) | INTRAMUSCULAR | Status: DC | PRN

## 2024-10-29 MED ORDER — FENTANYL CITRATE (PF) 50 MCG/ML IJ SOSY: 12.5000 ug | PREFILLED_SYRINGE | INTRAMUSCULAR | Status: DC | PRN

## 2024-10-29 MED ORDER — DOXYCYCLINE HYCLATE 100 MG PO TABS
100.0000 mg | ORAL_TABLET | Freq: Two times a day (BID) | ORAL | Status: DC
  Administered 2024-10-29 – 2024-10-31 (×5): 100 mg via ORAL
  Filled 2024-10-29 (×5): qty 1

## 2024-10-29 MED ORDER — SODIUM CHLORIDE 0.9% FLUSH
3.0000 mL | Freq: Two times a day (BID) | INTRAVENOUS | Status: DC
  Administered 2024-10-29 – 2024-10-31 (×4): 3 mL via INTRAVENOUS

## 2024-10-29 MED ORDER — ALBUTEROL SULFATE (2.5 MG/3ML) 0.083% IN NEBU: 2.5000 mg | INHALATION_SOLUTION | Freq: Four times a day (QID) | RESPIRATORY_TRACT | Status: DC | PRN

## 2024-10-29 MED ORDER — ACETAMINOPHEN 325 MG PO TABS: 650.0000 mg | ORAL_TABLET | Freq: Four times a day (QID) | ORAL | Status: DC | PRN

## 2024-10-29 MED ORDER — POLYETHYLENE GLYCOL 3350 17 G PO PACK: 17.0000 g | PACK | Freq: Every day | ORAL | Status: DC | PRN

## 2024-10-29 MED ORDER — ALBUTEROL SULFATE HFA 108 (90 BASE) MCG/ACT IN AERS: 2.0000 | INHALATION_SPRAY | Freq: Four times a day (QID) | RESPIRATORY_TRACT | Status: DC | PRN

## 2024-10-29 MED ORDER — LEVOTHYROXINE SODIUM 75 MCG PO TABS
75.0000 ug | ORAL_TABLET | Freq: Every day | ORAL | Status: DC
  Administered 2024-10-29 – 2024-10-30 (×3): 75 ug via ORAL
  Filled 2024-10-29 (×3): qty 1

## 2024-10-29 MED ORDER — DEXTROSE-SODIUM CHLORIDE 5-0.45 % IV SOLN: INTRAVENOUS | Status: DC

## 2024-10-29 MED ORDER — INSULIN ASPART 100 UNIT/ML IJ SOLN
0.0000 [IU] | INTRAMUSCULAR | Status: DC
  Administered 2024-10-30 – 2024-10-31 (×2): 1 [IU] via SUBCUTANEOUS
  Filled 2024-10-29 (×3): qty 1

## 2024-10-29 MED ORDER — ACETAMINOPHEN 650 MG RE SUPP: 650.0000 mg | Freq: Four times a day (QID) | RECTAL | Status: DC | PRN

## 2024-10-29 MED ORDER — SENNA 8.6 MG PO TABS: 1.0000 | ORAL_TABLET | Freq: Every day | ORAL | Status: DC | PRN

## 2024-10-29 NOTE — H&P (Signed)
 " History and Physical    William Wells FMW:[id number] DOB: [birth year] DOA: 10/28/2024  PCP: Shona Norleen PEDLAR, MD   Patient coming from: Home   Chief Complaint: Fatigue, productive cough, night sweats  HPI: William Wells is an 89 y.o. male with medical history significant for hyperlipidemia, type 2 diabetes mellitus, CKD 3B, hypothyroidism, CAD, heart block with pacer, stage I adenocarcinoma of the lung status post left upper lobectomy in 2021, and recent pneumonia diagnosis who presents with progressive symptoms despite outpatient treatment.  Patient reports that he began developing fatigue, malaise, and productive cough several days ago.  He was seen early in the course at an urgent care where he was diagnosed with pneumonia and started on antibiotics.  He reports completing azithromycin today and is also taking Augmentin which she will finish tomorrow.  Despite the antibiotics, he has developed worsening fatigue, ongoing productive cough, malaise, and night sweats.  He denies any fevers, chills, or weight loss but has had drenching night sweats recently.  He reports right leg swelling for the past couple months but no left leg swelling.  ED Course: Upon arrival to the ED, patient is found to be afebrile and saturating well on room air with normal RR, normal HR, and stable BP.  Labs are most notable for creatinine 2.00, normal WBC, and hemoglobin 10.2.  CT chest demonstrates progressive consolidation in the left base and thick-walled basilar fluid collection.  He was treated with Rocephin  and doxycycline  in the ED.  Review of Systems:  All other systems reviewed and apart from HPI, are negative.  Past Medical History:  Diagnosis Date   Acid reflux disease    Arthritis    Complication of anesthesia    appendix surgery (89 year old) - passed out once patient got back to floor unit   Diabetes mellitus    Dyspnea    Family history of adverse reaction to anesthesia    daughter states that she has  a difficult time waking up   Hiatal hernia    High cholesterol    Hypertension    Hypothyroidism    Lung cancer (HCC)    s/p XI robotic assisted thoracoscopy for LU lobectomy 06/22/20   Monoclonal gammopathy of unknown significance (MGUS) 03/28/2020   Normocytic anemia 03/28/2020   Renal disorder    renal insufficiency   Type 2 diabetes mellitus (HCC)     Past Surgical History:  Procedure Laterality Date   APPENDECTOMY     ARTERY BIOPSY Right 11/23/2023   Procedure: RIGHT BIOPSY TEMPORAL ARTERY;  Surgeon: Gretta Lonni PARAS, MD;  Location: [facility] OR;  Service: Vascular;  Laterality: Right;   BRONCHIAL BIOPSY  05/29/2020   Procedure: BRONCHIAL BIOPSIES;  Surgeon: Shelah Lamar RAMAN, MD;  Location: [facility] ENDOSCOPY;  Service: Pulmonary;;   BRONCHIAL BRUSHINGS  05/29/2020   Procedure: BRONCHIAL BRUSHINGS;  Surgeon: Shelah Lamar RAMAN, MD;  Location: [facility] ENDOSCOPY;  Service: Pulmonary;;   BRONCHIAL NEEDLE ASPIRATION BIOPSY  05/29/2020   Procedure: BRONCHIAL NEEDLE ASPIRATION BIOPSIES;  Surgeon: Shelah Lamar RAMAN, MD;  Location: [facility] ENDOSCOPY;  Service: Pulmonary;;   BRONCHIAL WASHINGS  05/29/2020   Procedure: BRONCHIAL WASHINGS;  Surgeon: Shelah Lamar RAMAN, MD;  Location: [facility] ENDOSCOPY;  Service: Pulmonary;;   CATARACT EXTRACTION W/PHACO Left 01/23/2023   Procedure: CATARACT EXTRACTION PHACO AND INTRAOCULAR LENS PLACEMENT (IOC);  Surgeon: Harrie Lynwood, MD;  Location: [facility] ORS;  Service: Ophthalmology;  Laterality: Left;  CDE: 13.69   CATARACT EXTRACTION W/PHACO Right 02/20/2023   Procedure:  CATARACT EXTRACTION PHACO AND INTRAOCULAR LENS PLACEMENT (IOC);  Surgeon: Harrie Agent, MD;  Location: [facility] ORS;  Service: Ophthalmology;  Laterality: Right;  CDE: 12.88   CERVICAL SPINE SURGERY     EYE SURGERY     strabismus   INTERCOSTAL NERVE BLOCK Left 06/22/2020   Procedure: INTERCOSTAL NERVE BLOCK;  Surgeon: Kerrin Elspeth BROCKS, MD;  Location: [facility] OR;  Service: Thoracic;  Laterality: Left;   NODE DISSECTION Left  06/22/2020   Procedure: NODE DISSECTION;  Surgeon: Kerrin Elspeth BROCKS, MD;  Location: [facility] OR;  Service: Thoracic;  Laterality: Left;   PACEMAKER IMPLANT N/A 08/18/2024   Procedure: PACEMAKER IMPLANT;  Surgeon: Inocencio Soyla Lunger, MD;  Location: [facility] INVASIVE CV LAB;  Service: Cardiovascular;  Laterality: N/A;   TONSILLECTOMY     VIDEO BRONCHOSCOPY WITH ENDOBRONCHIAL NAVIGATION N/A 05/29/2020   Procedure: VIDEO BRONCHOSCOPY WITH ENDOBRONCHIAL NAVIGATION;  Surgeon: Shelah Lamar RAMAN, MD;  Location: [facility] ENDOSCOPY;  Service: Pulmonary;  Laterality: N/A;    Social History:   reports that he quit smoking about 26 years ago. His smoking use included cigarettes. He started smoking about 73 years ago. He has a 23.5 pack-year smoking history. He has never used smokeless tobacco. He reports that he does not drink alcohol and does not use drugs.  Allergies[1]  Family History  Problem Relation Age of Onset   Diabetes Mother    Cancer Father    Cancer Sister    Diabetes Brother    Cancer Brother    Diabetes Brother    Parkinson's disease Brother    Diabetes Brother      Prior to Admission medications  Medication Sig Start Date End Date Taking? Authorizing Provider  acetaminophen  (TYLENOL ) 500 MG tablet Take 2 tablets (1,000 mg total) by mouth every 6 (six) hours. Patient taking differently: Take 500 mg by mouth daily as needed for headache, moderate pain (pain score 4-6) or mild pain (pain score 1-3). 06/26/20   Lucien Orren SAILOR, PA-C  albuterol  (VENTOLIN  HFA) 108 (90 Base) MCG/ACT inhaler INAHLE 2 PUFFS BY MOUTH 4 TIMES A DAY AS NEEDED FOR COUGH/WHEEZING    [provider]  aspirin  EC 81 MG tablet Take 1 tablet (81 mg total) by mouth daily. Swallow whole. 08/24/24   Henry Manuelita NOVAK, NP  atorvastatin  (LIPITOR ) 80 MG tablet Take 1 tablet (80 mg total) by mouth daily. 09/08/24   Emelia Josefa HERO, NP  clopidogrel  (PLAVIX ) 75 MG tablet Take 1 tablet (75 mg total) by mouth daily. 09/08/24    Cleaver, Jesse M, NP  diclofenac  Sodium (VOLTAREN ) 1 % GEL Apply 1 Application topically daily as needed (pain).    [provider]  docusate sodium  (COLACE) 100 MG capsule Take 1 capsule (100 mg total) by mouth 2 (two) times daily as needed for mild constipation. Patient taking differently: Take 100 mg by mouth 2 (two) times a week. 06/26/20   Conte, Tessa N, PA-C  ferrous sulfate 325 (65 FE) MG tablet Take 325 mg by mouth every Monday, Wednesday, and Friday.    [provider]  gabapentin  (NEURONTIN ) 300 MG capsule Take 1 capsule (300 mg total) by mouth 2 (two) times daily. 08/23/24   Akula, Vijaya, MD  glipiZIDE  (GLUCOTROL  XL) 2.5 MG 24 hr tablet Take 1.25 mg by mouth daily as needed (blood sugar above 160). 03/20/20   [provider]  guaiFENesin  (MUCINEX ) 600 MG 12 hr tablet Take 600 mg by mouth 2 (two) times daily.    [provider]  HYDROcodone -acetaminophen  (NORCO/VICODIN) 5-325 MG tablet Take 1 tablet by mouth every 8 (eight) hours as needed. 09/27/24   Williams, Megan E, NP  insulin  aspart (NOVOLOG ) 100 UNIT/ML injection CBG 70 - 120: 0 units  CBG 121 - 150: 2 units  CBG 151 - 200: 3 units  CBG 201 - 250: 5 units  CBG 251 - 300: 8 units  CBG 301 - 350: 11 units  CBG 351 - 400: 15 units 08/23/24   Akula, Vijaya, MD  levothyroxine  (SYNTHROID ) 75 MCG tablet Take 75 mcg by mouth at bedtime. 05/23/21   [provider]  LOKELMA  5 g packet Take 5 g by mouth every Monday, Wednesday, and Friday. 10/08/20   [provider]  Multiple Vitamin (MULTIVITAMIN ADULT PO) Take 1 tablet by mouth at bedtime.    [provider]  Multiple Vitamins-Minerals (PRESERVISION AREDS PO) Take 1 tablet by mouth in the morning and at bedtime.    [provider]  mupirocin ointment (BACTROBAN) 2 % Apply 1 Application topically 3 (three) times daily. 06/30/24   [provider]  nitroGLYCERIN  (NITROSTAT ) 0.4 MG SL tablet Place 1 tablet (0.4 mg  total) under the tongue every 5 (five) minutes as needed for chest pain. 09/16/24   Ladona Heinz, MD  polyethylene glycol (MIRALAX  / GLYCOLAX ) 17 g packet Take 17 g by mouth daily as needed (Constipation). 08/23/24   Akula, Vijaya, MD  tamsulosin  (FLOMAX ) 0.4 MG CAPS capsule Take 1 capsule (0.4 mg total) by mouth daily. 08/24/24   Cherlyn Labella, MD  VITAMIN D  PO Take 2 capsules by mouth at bedtime.    [provider]  VITAMIN E PO Take 1 capsule by mouth at bedtime.    [provider]    Physical Exam: Vitals:   10/28/24 2134 10/28/24 2135 10/28/24 2330 10/29/24 0030  BP:   130/68 (!) 134/58  Pulse: 73 79 80 80  Resp:   19 15  Temp:      SpO2: 100% 100% 100% 99%  Weight:      Height:         Constitutional: NAD, no pallor or diaphoresis   Eyes: PERTLA, lids and conjunctivae normal ENMT: Mucous membranes are moist. Posterior pharynx clear of any exudate or lesions.   Neck: supple, no masses  Respiratory: Speaking in full sentences. No wheezing.   Cardiovascular: S1 & S2 heard, regular rate and rhythm. Pretibial pitting edema on right.  Abdomen: No tenderness, soft. Bowel sounds active.  Musculoskeletal: no clubbing / cyanosis. No joint deformity upper and lower extremities.   Skin: no significant rashes, lesions, ulcers. Warm, dry, well-perfused. Neurologic: CN 2-12 grossly intact. Moving all extremities. Alert and oriented.  Psychiatric: Calm. Cooperative.    Labs and Imaging on Admission: I have personally reviewed following labs and imaging studies  CBC: Recent Labs  Lab 10/28/24 2049  WBC 7.9  HGB 10.2*  HCT 32.0*  MCV 89.1  PLT 221   Basic Metabolic Panel: Recent Labs  Lab 10/28/24 2049  NA 140  K 4.8  CL 103  CO2 26  GLUCOSE 104*  BUN 30*  CREATININE 2.00*  CALCIUM  9.5   GFR: Estimated Creatinine Clearance: 24.7 mL/min (A) (by C-G formula based on SCr of 2 mg/dL (H)). Liver Function Tests: No results for input(s): AST, ALT,  ALKPHOS, BILITOT, PROT, ALBUMIN  in the last 168 hours. No results for input(s): LIPASE, AMYLASE in the last 168 hours. No results for input(s): AMMONIA in the last 168 hours. Coagulation  Profile: No results for input(s): INR, PROTIME in the last 168 hours. Cardiac Enzymes: No results for input(s): CKTOTAL, CKMB, CKMBINDEX, TROPONINI in the last 168 hours. BNP (last 3 results) No results for input(s): PROBNP in the last 8760 hours. HbA1C: No results for input(s): HGBA1C in the last 72 hours. CBG: No results for input(s): GLUCAP in the last 168 hours. Lipid Profile: No results for input(s): CHOL, HDL, LDLCALC, TRIG, CHOLHDL, LDLDIRECT in the last 72 hours. Thyroid  Function Tests: No results for input(s): TSH, T4TOTAL, FREET4, T3FREE, THYROIDAB in the last 72 hours. Anemia Panel: No results for input(s): VITAMINB12, FOLATE, FERRITIN, TIBC, IRON, RETICCTPCT in the last 72 hours. Urine analysis:    Component Value Date/Time   COLORURINE YELLOW 08/10/2024 1300   APPEARANCEUR CLEAR 08/10/2024 1300   LABSPEC 1.008 08/10/2024 1300   PHURINE 5.0 08/10/2024 1300   GLUCOSEU 150 (A) 08/10/2024 1300   HGBUR MODERATE (A) 08/10/2024 1300   BILIRUBINUR NEGATIVE 08/10/2024 1300   KETONESUR NEGATIVE 08/10/2024 1300   PROTEINUR NEGATIVE 08/10/2024 1300   UROBILINOGEN 0.2 04/22/2008 1500   NITRITE NEGATIVE 08/10/2024 1300   LEUKOCYTESUR NEGATIVE 08/10/2024 1300   Sepsis Labs: @LABRCNTIP (procalcitonin:4,lacticidven:4) )No results found for this or any previous visit (from the past 240 hours).   Radiological Exams on Admission: CT Chest Wo Contrast Result Date: 10/28/2024 EXAM: CT CHEST WITHOUT CONTRAST 10/28/2024 10:48:53 PM TECHNIQUE: CT of the chest was performed without the administration of intravenous contrast. Multiplanar reformatted images are provided for review. Automated exposure control, iterative reconstruction, and/or  weight based adjustment of the mA/kV was utilized to reduce the radiation dose to as low as reasonably achievable. COMPARISON: Chest radiograph 10/28/2024 and CT chest 08/05/2024. CLINICAL HISTORY: Productive cough and generalized weakness for 5 days. FINDINGS: MEDIASTINUM: Cardiac pacemaker. Mild cardiac enlargement. Minimal pericardial effusion. Calcification of the aorta and coronary arteries. No aneurysm. The central airways are clear. Small esophageal hiatal hernia. The esophagus is decompressed. The thyroid  gland is atrophic. LYMPH NODES: No significant lymphadenopathy in the chest. LUNGS AND PLEURA: Severe diffuse emphysematous changes throughout the lungs. Postoperative partial left lung resection. Consolidation in the left lung base is progressing since the prior study. Thick-walled left basilar pleural collection suggesting loculated fluid. The appearance is similar to the prior study. No pulmonary edema. No pneumothorax. SOFT TISSUES/BONES: Degenerative changes in the spine. No acute bony abnormalities. UPPER ABDOMEN: No acute abnormalities demonstrated in the visualized upper abdomen. IMPRESSION: 1. Progression of consolidation in the left lung base since the prior study. 2. Thick-walled left basilar pleural collection suggesting loculated fluid, similar to the prior study. 3. Severe diffuse emphysematous changes throughout the lungs. Electronically signed by: Elsie Gravely MD 10/28/2024 10:56 PM EST RP Workstation: [id number]   DG Chest 2 View Result Date: 10/28/2024 EXAM: 2 VIEW(S) XRAY OF THE CHEST 10/28/2024 09:05:00 PM COMPARISON: 09/14/2024 CLINICAL HISTORY: Shortness of breath. FINDINGS: LINES, TUBES AND DEVICES: Left upper chest pacemaker in place. LUNGS AND PLEURA: Persistent left lung base opacity with left pleural effusion. No pneumothorax. HEART AND MEDIASTINUM: Calcified tortuous aorta. No acute abnormality of the cardiac silhouette. BONES AND SOFT TISSUES: Left thoracotomy noted.  Degenerative changes of spine. No acute osseous abnormality. IMPRESSION: 1. Persistent left lung base opacity with left pleural effusion. Electronically signed by: Oneil Devonshire MD 10/28/2024 09:12 PM EST RP Workstation: [id number]    EKG: Independently reviewed. Paced.   Assessment/Plan   1. Left lower lobe pneumonia; left pleural effusion  - Presents with progressive symptoms despite outpatient treatment  - Culture  sputum, check strep pneumo and legionella antigens, consult IR for thoracentesis, continue Rocephin  and doxycycline     2. CAD   - Continue ASA, Plavix , and Lipitor     3. Type II DM  - A1c was 7.1% in October 2025  - Check CBGs and use low-intensity SSI for now   4. CKD 3B  - Renally-dose medications    5. Hypothyroidism  - Synthroid     6. RLE swelling  - Check venous Doppler     DVT prophylaxis: SCDs Code Status: DNR/DNI Level of Care: Level of care: Telemetry Family Communication: Daughter at bedside   Disposition Plan:  Patient is from: Home  Anticipated d/c is to: TBD Anticipated d/c date is: 10/31/24 Patient currently: Pending IR eval for possible diagnostic thora, clinical improvement  Consults called: None  Admission status: Inpatient     Evalene GORMAN Sprinkles, MD [facility]  10/29/2024, 12:50 AM       [1] No Known Allergies  "

## 2024-10-29 NOTE — ED Notes (Signed)
 Pt in bed, pt has aprox 3 inch area of swelling around in in R forearm, d/c iv, cath intact, warm pack placed per pharmacy instructions, pt denies pain, new iv placed in L forearm

## 2024-10-29 NOTE — Progress Notes (Signed)
 " PROGRESS NOTE William Wells  FMW:[id number] DOB: [birth year] DOA: 10/28/2024 PCP: Shona Norleen PEDLAR, MD  Brief Narrative/Hospital Course: William Wells is a 89 y.o. male with PMH of  HLD,T2DM, CKD 3b, hypothyroidism, CAD, heart block with pacer, stage I adenocarcinoma of the lung S/P LU lobectomy in 2021, and recent pneumonia diagnosis who presented with progressive symptoms despite outpatient treatment. Patient reported that he began developing fatigue, malaise, and productive cough several days ago, seen early in the course at an urgent care where he was diagnosed with pneumonia and started on antibiotics, completing azithromycin 1/23 and still on Augmentin till 1/24, but patient having worsening fatigue with ongoing productive cough malaise and night sweats although no fever or chills or weight loss. He reports right leg swelling for the past couple months but no left leg swelling. In ED: afebrile and saturating well on room air with normal RR, normal HR, and stable BP.  Labs- creatinine 2.00, normal WBC, and hemoglobin 10.2. CT chest WO>>progressive consolidation in the left base-new since prior study, thick-walled basilar fluid collection-suggesting loculated fluid similar to prior study with severe diffuse emphysema. Patient was placed on IV antibiotic and admitted for further management, ultrasound-guided thoracentesis ordered.  Subjective: Seen and examined today Resting well no complaints Having night sweats at home. Overnight RA, afebrile, VSS, Labs- Creat 2> 1.7, hb 10-9gm Patient having right forearm swelling this morning and IV line was removed  Assessment and plan:  LLL pneumonia left pleural effusion with loculation Severe emphysema: Recently treated pneumonia and worsening, concerning for parapneumonic effusion with some loculation. Patient not hypoxic labs showed stable WBC count.  Ultrasound-guided thoracentesis ordered continue ceftriaxone  doxycycline .  Might need pulmonary eval.  follow-up [place] sputum, strep pneumo and legionella antigens  CAD   No chest pain, continue ASA, Plavix , and Lipitor      HTN: Blood pressure stable telmisartan on hold  Type II DM  A1c was 7.1% in October 2025, blood sugar well-controlled on sliding scale Recent Labs  Lab 10/29/24 0308 10/29/24 0746  GLUCAP 97 125*    CKD 3B:  More or less at baseline monitor renal function  Hypothyroidism  Continue Synthroid     Chronic anemia: Mild drop in hemoglobin around 9 to 11 g, monitor check anemia panel in a.m.  Mildly elevated troponin: Flat in 19> 19 likely demand ischemia in the setting of pneumonia  RLE swelling  Check venous Doppler    BPH: Resume finasteride   Mobilize with PT OT PT Orders: Active PT Follow up Rec:     DVT prophylaxis: enoxaparin  (LOVENOX ) injection 40 mg Start: 10/29/24 1215 SCDs Start: 10/29/24 0046  Code Status:   Code Status: Limited: Do not attempt resuscitation (DNR) -DNR-LIMITED -Do Not Intubate/DNI  Family Communication: plan of care discussed with patient at bedside. Patient status is: Remains hospitalized because of severity of illness Level of care: Telemetry   Dispo: The patient is from: Home            Anticipated disposition: TBD Objective: Vitals last 24 hrs: Vitals:   10/29/24 0400 10/29/24 0430 10/29/24 0747 10/29/24 0930  BP: 118/62  (!) 128/57 117/62  Pulse: 88  85 85  Resp: 20  18 20   Temp:  98 F (36.7 C) 97.9 F (36.6 C) (!) 97.4 F (36.3 C)  TempSrc:  Oral Oral   SpO2: 100%  100% 98%  Weight:      Height:        Physical Examination: General exam: alert awake, oriented, older  than stated age HEENT:Oral mucosa moist, Ear/Nose WNL grossly Respiratory system: Bilaterally diminished  BS,no use of accessory muscle Cardiovascular system: S1 & S2 +, No JVD. Gastrointestinal system: Abdomen soft,NT,ND, BS+ Nervous System: Alert, awake, moving all extremities,and following commands. Extremities: extremities warm, leg  edema + on right lower extremity Skin: Warm, no rashes MSK: Normal muscle bulk,tone, power   Medications reviewed:  Scheduled Meds:  aspirin  EC  81 mg Oral Daily   atorvastatin   80 mg Oral Daily   clopidogrel   75 mg Oral Daily   doxycycline   100 mg Oral Q12H   enoxaparin  (LOVENOX ) injection  40 mg Subcutaneous Q24H   finasteride   5 mg Oral Daily   gabapentin   300 mg Oral BID   insulin  aspart  0-6 Units Subcutaneous Q4H   levothyroxine   75 mcg Oral QHS   sodium chloride  flush  3 mL Intravenous Q12H   tamsulosin   0.4 mg Oral Daily   Continuous Infusions:  cefTRIAXone  (ROCEPHIN )  IV 2 g (10/29/24 1105)   dextrose  5 % and 0.45 % NaCl 50 mL/hr at 10/29/24 0342   Diet: Diet Order             Diet NPO time specified Except for: Sips with Meds, Ice Chips  Diet effective now                      [employer] (From admission, onward)     Start     Ordered   11/05/24 0500  Creatinine, serum  (enoxaparin  (LOVENOX )    CrCl >/= 30 ml/min)  Weekly,   R     Comments: while on enoxaparin  therapy    10/29/24 1116   10/29/24 0500  Basic metabolic panel  Daily,   R      10/29/24 0049   10/29/24 0500  CBC  Daily,   R      10/29/24 0049   10/29/24 0047  Legionella Pneumophila Serogp 1 Ur Ag  (COPD / Pneumonia / Cellulitis / Lower Extremity Wound (Diabetic Foot Infection))  Once,   R        10/29/24 0049   10/29/24 0047  Expectorated Sputum Assessment w Gram Stain, Rflx to Resp Cult  (COPD / Pneumonia / Cellulitis / Lower Extremity Wound (Diabetic Foot Infection))  Once,   R        10/29/24 0049           Data Reviewed: I have personally reviewed following labs and imaging studies ( see epic result tab) CBC: Recent Labs  Lab 10/28/24 2049 10/29/24 0431  WBC 7.9 7.4  HGB 10.2* 9.1*  HCT 32.0* 27.6*  MCV 89.1 86.8  PLT 221 188   CMP: Recent Labs  Lab 10/28/24 2049 10/29/24 0431  NA 140 139  K 4.8 4.1  CL 103 105  CO2 26 25  GLUCOSE 104* 118*  BUN 30* 27*   CREATININE 2.00* 1.78*  CALCIUM  9.5 9.1   GFR: Estimated Creatinine Clearance: 27.8 mL/min (A) (by C-G formula based on SCr of 1.78 mg/dL (H)). Recent Labs  Lab 10/29/24 0431  AST 21  ALT 33  ALKPHOS 65  BILITOT 0.3  PROT 6.8  ALBUMIN  3.3*   No results for input(s): LIPASE, AMYLASE in the last 168 hours. No results for input(s): AMMONIA in the last 168 hours. Coagulation Profile: No results for input(s): INR, PROTIME in the last 168 hours. Antimicrobials/Microbiology: Anti-infectives (From admission, onward)    Start  Dose/Rate Route Frequency Ordered Stop   10/29/24 1000  cefTRIAXone  (ROCEPHIN ) 2 g in sodium chloride  0.9 % 100 mL IVPB        2 g 200 mL/hr over 30 Minutes Intravenous Every 24 hours 10/29/24 0049 11/02/24 0959   10/29/24 1000  doxycycline  (VIBRA -TABS) tablet 100 mg        100 mg Oral Every 12 hours 10/29/24 0049     10/28/24 2245  cefTRIAXone  (ROCEPHIN ) 1 g in sodium chloride  0.9 % 100 mL IVPB        1 g 200 mL/hr over 30 Minutes Intravenous  Once 10/28/24 2230 10/28/24 2342   10/28/24 2245  doxycycline  (VIBRA -TABS) tablet 100 mg        100 mg Oral  Once 10/28/24 2230 10/28/24 2311         Component Value Date/Time   SDES BLOOD SITE NOT SPECIFIED 08/08/2024 1453   SPECREQUEST  08/08/2024 1453    BOTTLES DRAWN AEROBIC AND ANAEROBIC Blood Culture adequate volume   CULT  08/08/2024 1453    NO GROWTH 5 DAYS Performed at [facility], [address] [place], [place] [address]    REPTSTATUS 08/13/2024 FINAL 08/08/2024 1453    Procedures:    Mennie LAMY, MD [facility] 10/29/2024, 11:17 AM   "

## 2024-10-29 NOTE — Hospital Course (Addendum)
 William Wells is a 89 y.o. male with PMH of  HLD,T2DM, CKD 3b, hypothyroidism, CAD, heart block with pacer, stage I adenocarcinoma of the lung S/P LU lobectomy in 2021, and recent pneumonia diagnosis who presented with progressive symptoms despite outpatient treatment. Patient reported that he began developing fatigue, malaise, and productive cough several days ago, seen early in the course at an urgent care where he was diagnosed with pneumonia and started on antibiotics, completing azithromycin 1/23 and still on Augmentin till 1/24, but patient having worsening fatigue with ongoing productive cough malaise and night sweats although no fever or chills or weight loss. He reports right leg swelling for the past couple months but no left leg swelling. In ED: afebrile and saturating well on room air with normal RR, normal HR, and stable BP.  Labs- creatinine 2.00, normal WBC, and hemoglobin 10.2. CT chest WO>>progressive consolidation in the left base-new since prior study, thick-walled basilar fluid collection-suggesting loculated fluid similar to prior study with severe diffuse emphysema. Patient was placed on IV antibiotic and admitted for further management, ultrasound-guided thoracentesis ordered. New finding appears to be left lower lobe ground glass opacity that is very subtle and is new compared to the CT scan in October 2025. He follows with Dr. Belva for his lung cancer and neck CT scan coming up in July 2025. Patient with classic presentation of infectious pneumonia with fever chills that has now improved. Ultrasound-guided thoracentesis showed scarring, seen by pulmonary, at this time advised to complete antibiotic course, he is renal panel was positive. Overall patient is clinically improved and will plan to discharge home He has a follow-up with Dr. Sherrod in July 2026 for CT scan  Subjective: Seen and examined  Daughter at the bedside no new complaints. Overnight vitals stable afebrile  labs with creatinine further improved 1.6 mild chronic anemia with stable folate B12 and slightly low iron  Patient and family are agreeable for discharge today  Discharge Diagnoses:   LLL pneumonia left pleural effusion with loculation-chronic left fibrothorax since 2022 Severe emphysema Rhinovirus infection: Patient is clinically improved following IV antibiotics.  Seen by IR no fluid to drain, per pulmonary patient has chronic left fibrothorax since 2022 no changes, at this time no further intervention needed LLL GGO's new indicating infectious etiology. Overall clinically improving we will discharge on p.o. antibiotics,cont bronchodilators and patient follow-up with his pulmonary team  CAD   No chest pain, continue ASA, Plavix , and Lipitor      HTN: BPl-controlled. Resume telmisartan on discharge.  Type II DM  A1c was 7.1% in October 2025, diabetes well-controlled. Monitor Recent Labs  Lab 10/30/24 2022 10/31/24 0037 10/31/24 0442 10/31/24 0743 10/31/24 1135  GLUCAP 143* 155* 127* 173* 115*    CKD 3B:  Remains stable. Monitor renal function intermittently  Recent Labs    08/19/24 0218 08/20/24 0225 08/21/24 0244 08/22/24 0238 09/08/24 1542 09/14/24 0938 10/28/24 2049 10/29/24 0431 10/30/24 0616 10/31/24 0018  BUN 37* 37* 37* 41* 37* 21 30* 27* 26* 31*  CREATININE 2.19* 2.32* 2.29* 2.32* 1.95* 1.84* 2.00* 1.78* 1.75* 1.61*  CO2 20* 24 24 21* 23 23 26 25 24 24   K 5.6* 4.9 4.1 4.6 5.2 3.9 4.8 4.1 4.5 4.0    Hypothyroidism  Continue Synthroid .    Chronic anemia: Mild drop in hemoglobin around 9 to 11 g, monitor check anemia panel in a.m.  Mildly elevated troponin: Flat in 19> 19 likely demand ischemia in the setting of pneumonia  RLE swelling  Venous Doppler is negative.  BPH: Cont finasteride   Deconditioning/debility: Mobilize with PT OT PT Orders: Active PT Follow up Rec: Home Health Pt1/24/2026 1300    DVT prophylaxis: enoxaparin  (LOVENOX )  injection 40 mg Start: 10/29/24 1215 SCDs Start: 10/29/24 0046  Code Status:   Code Status: Limited: Do not attempt resuscitation (DNR) -DNR-LIMITED -Do Not Intubate/DNI  Family Communication: plan of care discussed with patient and his daughter at bedside. Patient status is: Remains hospitalized because of severity of illness Level of care: Telemetry   Dispo: The patient is from: Home            Anticipated disposition: HOME W/ HH  Objective: Vitals last 24 hrs: Vitals:   10/31/24 0023 10/31/24 0443 10/31/24 0743 10/31/24 1135  BP: (!) 109/58 (!) 114/56 130/64 (!) 119/57  Pulse: 85 77 79 71  Resp:  16    Temp: 98 F (36.7 C)  98.5 F (36.9 C) 97.8 F (36.6 C)  TempSrc:      SpO2: 98% 99% 97% 100%  Weight:      Height:       Physical Examination: General exam: AAOX3 HEENT:Oral mucosa moist, Ear/Nose WNL grossly Respiratory system: Diminished BS left more than right,  Cardiovascular system: S1 & S2 +, No JVD. Gastrointestinal system: Abdomen soft,NT,ND, BS+ Nervous System: Alert, awake, non focal Extremities: extremities warm, leg edema + on right lower extremity Skin: Warm, no rashes MSK: Normal muscle bulk,tone, power

## 2024-10-29 NOTE — Evaluation (Signed)
 Physical Therapy Evaluation Patient Details Name: William Wells MRN: [id number] DOB: 1934/09/03 Today's Date: 10/29/2024  History of Present Illness  Pt is a 89 y.o. M who presents 10/28/2024 with LLL PNA and left pleural effusion. PMH: arthritis, T2DM, HTN, hypothyroidism, lung CA s/p Lt upper lobectomy, CKD, HLD, heart block with pacer.  Clinical Impression  Pt admitted with above. PTA, pt lives with his daughter, is ambulatory with cane vs Rollator vs no AD, and is independent with ADL's. Pt reports malaise and weakness; states he has not eaten since 1 PM yesterday in prep for potential thoracentesis. Pt agreeable to sit up on edge of bed, but declines transferring to chair or ambulation. He is modI with bed mobility. SpO2 97% on RA, HR 85. Pt encouraged to mobilize as tolerated and sit up in chair for meals. Recommend continued HHPT at d/c.      If plan is discharge home, recommend the following: Assistance with cooking/housework;Assist for transportation;Help with stairs or ramp for entrance   Can travel by private vehicle        Equipment Recommendations None recommended by PT  Recommendations for Other Services       Functional Status Assessment Patient has had a recent decline in their functional status and demonstrates the ability to make significant improvements in function in a reasonable and predictable amount of time.     Precautions / Restrictions Precautions Precautions: Fall Restrictions Weight Bearing Restrictions Per Provider Order: No      Mobility  Bed Mobility Overal bed mobility: Modified Independent                  Transfers                   General transfer comment: Pt deferred    Ambulation/Gait                  Stairs            Wheelchair Mobility     Tilt Bed    Modified Rankin (Stroke Patients Only)       Balance Overall balance assessment: Needs assistance Sitting-balance support: Feet  supported Sitting balance-Leahy Scale: Good                                       Pertinent Vitals/Pain Pain Assessment Pain Assessment: No/denies pain    Home Living Family/patient expects to be discharged to:: Private residence Living Arrangements: Children Available Help at Discharge: Family;Available PRN/intermittently Type of Home: House Home Access: Ramped entrance       Home Layout: One level Home Equipment: Shower seat - built in;Cane - single point;Rollator (4 wheels);Wheelchair - manual      Prior Function Prior Level of Function : Independent/Modified Independent             Mobility Comments: uses cane vs no AD for household distances, Rollator for longer distances ADLs Comments: Ind, daughter manages meds     Extremity/Trunk Assessment   Upper Extremity Assessment Upper Extremity Assessment: Defer to OT evaluation    Lower Extremity Assessment Lower Extremity Assessment: Overall WFL for tasks assessed    Cervical / Trunk Assessment Cervical / Trunk Assessment: Normal  Communication   Communication Communication: No apparent difficulties    Cognition Arousal: Alert Behavior During Therapy: WFL for tasks assessed/performed   PT - Cognitive impairments: No apparent impairments  Following commands: Intact       Cueing Cueing Techniques: Verbal cues     General Comments      Exercises     Assessment/Plan    PT Assessment Patient needs continued PT services  PT Problem List Decreased activity tolerance;Decreased mobility       PT Treatment Interventions DME instruction;Gait training;Functional mobility training;Therapeutic activities;Therapeutic exercise;Balance training;Patient/family education    PT Goals (Current goals can be found in the Care Plan section)  Acute Rehab PT Goals Patient Stated Goal: feel better PT Goal Formulation: With patient Time For Goal Achievement:  11/12/24 Potential to Achieve Goals: Good    Frequency Min 2X/week     Co-evaluation               AM-PAC PT 6 Clicks Mobility  Outcome Measure Help needed turning from your back to your side while in a flat bed without using bedrails?: None Help needed moving from lying on your back to sitting on the side of a flat bed without using bedrails?: None Help needed moving to and from a bed to a chair (including a wheelchair)?: A Little Help needed standing up from a chair using your arms (e.g., wheelchair or bedside chair)?: A Little Help needed to walk in hospital room?: A Little Help needed climbing 3-5 steps with a railing? : A Little 6 Click Score: 20    End of Session   Activity Tolerance: Other (comment) (limited by malaise) Patient left: in bed;with call bell/phone within reach Nurse Communication: Mobility status PT Visit Diagnosis: Difficulty in walking, not elsewhere classified (R26.2)    Time: 1300-1320 PT Time Calculation (min) (ACUTE ONLY): 20 min   Charges:   PT Evaluation $PT Eval Low Complexity: 1 Low   PT General Charges $$ ACUTE PT VISIT: 1 Visit         Aleck Daring, PT, DPT Acute Rehabilitation Services Office [phone]   Aleck ONEIDA Daring 10/29/2024, 2:28 PM

## 2024-10-30 ENCOUNTER — Inpatient Hospital Stay (HOSPITAL_COMMUNITY)

## 2024-10-30 DIAGNOSIS — J189 Pneumonia, unspecified organism: Secondary | ICD-10-CM | POA: Diagnosis not present

## 2024-10-30 DIAGNOSIS — R609 Edema, unspecified: Secondary | ICD-10-CM | POA: Diagnosis not present

## 2024-10-30 LAB — RETICULOCYTES
Immature Retic Fract: 13.8 % (ref 2.3–15.9)
RBC.: 3.27 MIL/uL — ABNORMAL LOW (ref 4.22–5.81)
Retic Count, Absolute: 35.6 10*3/uL (ref 19.0–186.0)
Retic Ct Pct: 1.1 % (ref 0.4–3.1)

## 2024-10-30 LAB — RESPIRATORY PANEL BY PCR

## 2024-10-30 LAB — GLUCOSE, CAPILLARY
Glucose-Capillary: 119 mg/dL — ABNORMAL HIGH (ref 70–99)
Glucose-Capillary: 133 mg/dL — ABNORMAL HIGH (ref 70–99)
Glucose-Capillary: 134 mg/dL — ABNORMAL HIGH (ref 70–99)
Glucose-Capillary: 143 mg/dL — ABNORMAL HIGH (ref 70–99)
Glucose-Capillary: 158 mg/dL — ABNORMAL HIGH (ref 70–99)
Glucose-Capillary: 199 mg/dL — ABNORMAL HIGH (ref 70–99)

## 2024-10-30 LAB — IRON AND TIBC
Iron: 25 ug/dL — ABNORMAL LOW (ref 45–182)
Saturation Ratios: 11 % — ABNORMAL LOW (ref 17.9–39.5)
TIBC: 228 ug/dL — ABNORMAL LOW (ref 250–450)
UIBC: 204 ug/dL

## 2024-10-30 LAB — CBC
HCT: 28.6 % — ABNORMAL LOW (ref 39.0–52.0)
Hemoglobin: 9.4 g/dL — ABNORMAL LOW (ref 13.0–17.0)
MCH: 28.5 pg (ref 26.0–34.0)
MCHC: 32.9 g/dL (ref 30.0–36.0)
MCV: 86.7 fL (ref 80.0–100.0)
Platelets: 204 10*3/uL (ref 150–400)
RBC: 3.3 MIL/uL — ABNORMAL LOW (ref 4.22–5.81)
RDW: 15.1 % (ref 11.5–15.5)
WBC: 8.2 10*3/uL (ref 4.0–10.5)
nRBC: 0 % (ref 0.0–0.2)

## 2024-10-30 LAB — VITAMIN B12: Vitamin B-12: 1036 pg/mL — ABNORMAL HIGH (ref 180–914)

## 2024-10-30 LAB — BASIC METABOLIC PANEL WITH GFR
Anion gap: 9 (ref 5–15)
BUN: 26 mg/dL — ABNORMAL HIGH (ref 8–23)
CO2: 24 mmol/L (ref 22–32)
Calcium: 9.2 mg/dL (ref 8.9–10.3)
Chloride: 104 mmol/L (ref 98–111)
Creatinine, Ser: 1.75 mg/dL — ABNORMAL HIGH (ref 0.61–1.24)
GFR, Estimated: 37 mL/min — ABNORMAL LOW
Glucose, Bld: 172 mg/dL — ABNORMAL HIGH (ref 70–99)
Potassium: 4.5 mmol/L (ref 3.5–5.1)
Sodium: 136 mmol/L (ref 135–145)

## 2024-10-30 LAB — FERRITIN: Ferritin: 273 ng/mL (ref 24–336)

## 2024-10-30 LAB — FOLATE: Folate: >20 ng/mL

## 2024-10-30 NOTE — Progress Notes (Signed)
 RLE venous duplex has been completed.   Results can be found under chart review under CV PROC. 10/30/2024 10:09 AM Pallie Swigert RVT, RDMS

## 2024-10-30 NOTE — Progress Notes (Signed)
 Patient ID: William Wells, male   DOB: 06-03-1934, 89 y.o.   MRN: [id number] Pt seen in the US  dept for ordered left thoracentesis.  Upon scanning left chest with US , no fluid seen to be able to perform procedure.  US  images saved and uploaded.   Please reach back to IR in future if patient develops larger effusion and we can re-evaluate for thoracentesis.  Please see imaging section of Epic for full dictation.  Kimble DEL Chenay Nesmith PA-C 10/30/2024 1:31 PM

## 2024-10-30 NOTE — Consult Note (Signed)
 "  NAME:  William Wells, MRN:  [id number], DOB:  1934/03/31, LOS: 2 ADMISSION DATE:  10/28/2024, CONSULTATION DATE:  10/30/24 REFERRING MD:  Mennie, CHIEF COMPLAINT:  pleural effusion   History of Present Illness:  89 yo M PMH CAD on DAPT, CKD 3b, DM2, heart block s/p PPM, stg1 adeno s/p L upper lobectomy (2021)  who was admitted to [facility] 10/28/24 after presenting w productive cough and night sweats. Was recently rx azithro augmentin outpt for PNA, completing azithro day of presentation and nearing completion of augmentin. Sx worsened prompting ED eval and admission.   Ctchest 10/28/24 w L pleural effusion  In chart review it appears this has been present for quite some time  --  It has been present on scans since 2022 and it has been felt to be a fibrothorax   IR was consulted for thora and on 1/25 there was not an actionable pocket  PCCM is consulted in this setting   Pertinent  Medical History  L upper lobectomy   Significant Hospital Events: Including procedures, antibiotic start and stop dates in addition to other pertinent events   1/23 admitted 1/25 IR had no actionable pocket of fluid PCCM consulted for a chest tube   Interim History / Subjective:   No drainable pocket for IR   Pt and daughter in the room   Objective    Blood pressure 120/63, pulse 77, temperature 98 F (36.7 C), temperature source Oral, resp. rate 18, height 5' 8 (1.727 m), weight 81.2 kg, SpO2 99%.        Intake/Output Summary (Last 24 hours) at 10/30/2024 1225 Last data filed at 10/29/2024 2100 Gross per 24 hour  Intake 120 ml  Output --  Net 120 ml   Filed Weights   10/28/24 2040  Weight: 81.2 kg    Examination: General: very pleasant wdwn elderly M NAD  HENT: NCAT  Lungs: clear  Cardiovascular: cap refill is brisk  Abdomen: soft, normoactive  Neuro: aaox4 GU: defer   Resolved problem list   Assessment and Plan   Chronic, L pleural effusion -- favored to be fibrothorax -has been on  imaging since 2022 and has been pretty stable. Imaging characteristics have been c/w fibrothorax.  - on IR eval 1/25 there was not an actionable fluid pocket with ultrasound P -no indication for chest tube    Labs   CBC: Recent Labs  Lab 10/28/24 2049 10/29/24 0431 10/30/24 0616  WBC 7.9 7.4 8.2  HGB 10.2* 9.1* 9.4*  HCT 32.0* 27.6* 28.6*  MCV 89.1 86.8 86.7  PLT 221 188 204    Basic Metabolic Panel: Recent Labs  Lab 10/28/24 2049 10/29/24 0431 10/30/24 0616  NA 140 139 136  K 4.8 4.1 4.5  CL 103 105 104  CO2 26 25 24   GLUCOSE 104* 118* 172*  BUN 30* 27* 26*  CREATININE 2.00* 1.78* 1.75*  CALCIUM  9.5 9.1 9.2   GFR: Estimated Creatinine Clearance: 28.2 mL/min (A) (by C-G formula based on SCr of 1.75 mg/dL (H)). Recent Labs  Lab 10/28/24 2049 10/29/24 0431 10/30/24 0616  WBC 7.9 7.4 8.2    Liver Function Tests: Recent Labs  Lab 10/29/24 0431  AST 21  ALT 33  ALKPHOS 65  BILITOT 0.3  PROT 6.8  ALBUMIN  3.3*   No results for input(s): LIPASE, AMYLASE in the last 168 hours. No results for input(s): AMMONIA in the last 168 hours.  ABG    Component Value Date/Time   PHART  7.360 06/22/2020 0820   PCO2ART 49.3 (H) 06/22/2020 0820   PO2ART 536 (H) 06/22/2020 0820   HCO3 28.6 (H) 06/22/2020 0820   TCO2 24 06/22/2020 0954   ACIDBASEDEF 0.2 06/20/2020 1137   O2SAT 100.0 06/22/2020 0820     Coagulation Profile: No results for input(s): INR, PROTIME in the last 168 hours.  Cardiac Enzymes: No results for input(s): CKTOTAL, CKMB, CKMBINDEX, TROPONINI in the last 168 hours.  HbA1C: Hemoglobin A1C  Date/Time Value Ref Range Status  04/15/2019 12:00 AM 7.4  Final   Hgb A1c MFr Bld  Date/Time Value Ref Range Status  08/05/2024 03:43 PM 7.1 (H) 4.8 - 5.6 % Final    Comment:    (NOTE) Diagnosis of Diabetes The following HbA1c ranges recommended by the American Diabetes Association (ADA) may be used as an aid in the diagnosis of  diabetes mellitus.  Hemoglobin             Suggested A1C NGSP%              Diagnosis  <5.7                   Non Diabetic  5.7-6.4                Pre-Diabetic  >6.4                   Diabetic  <7.0                   Glycemic control for                       adults with diabetes.    06/20/2020 11:28 AM 6.8 (H) 4.8 - 5.6 % Final    Comment:    (NOTE) Pre diabetes:          5.7%-6.4%  Diabetes:              >6.4%  Glycemic control for   <7.0% adults with diabetes     CBG: Recent Labs  Lab 10/29/24 2031 10/30/24 0016 10/30/24 0417 10/30/24 0736 10/30/24 1142  GLUCAP 149* 158* 119* 133* 134*    Review of Systems:   Review of Systems  Constitutional:  Positive for chills and malaise/fatigue.  Respiratory:  Positive for cough.   Cardiovascular: Negative.   Gastrointestinal: Negative.   Skin: Negative.      Past Medical History:  He,  has a past medical history of Acid reflux disease, Arthritis, Complication of anesthesia, Diabetes mellitus, Dyspnea, Family history of adverse reaction to anesthesia, Hiatal hernia, High cholesterol, Hypertension, Hypothyroidism, Lung cancer (HCC), Monoclonal gammopathy of unknown significance (MGUS) (03/28/2020), Normocytic anemia (03/28/2020), Renal disorder, and Type 2 diabetes mellitus (HCC).   Surgical History:   Past Surgical History:  Procedure Laterality Date   APPENDECTOMY     ARTERY BIOPSY Right 11/23/2023   Procedure: RIGHT BIOPSY TEMPORAL ARTERY;  Surgeon: Gretta Lonni PARAS, MD;  Location: [facility] OR;  Service: Vascular;  Laterality: Right;   BRONCHIAL BIOPSY  05/29/2020   Procedure: BRONCHIAL BIOPSIES;  Surgeon: Shelah Lamar RAMAN, MD;  Location: [facility] ENDOSCOPY;  Service: Pulmonary;;   BRONCHIAL BRUSHINGS  05/29/2020   Procedure: BRONCHIAL BRUSHINGS;  Surgeon: Shelah Lamar RAMAN, MD;  Location: [facility] ENDOSCOPY;  Service: Pulmonary;;   BRONCHIAL NEEDLE ASPIRATION BIOPSY  05/29/2020   Procedure: BRONCHIAL NEEDLE ASPIRATION  BIOPSIES;  Surgeon: Shelah Lamar RAMAN, MD;  Location: [facility] ENDOSCOPY;  Service: Pulmonary;;   BRONCHIAL  WASHINGS  05/29/2020   Procedure: BRONCHIAL WASHINGS;  Surgeon: Shelah Lamar RAMAN, MD;  Location: [facility] ENDOSCOPY;  Service: Pulmonary;;   CATARACT EXTRACTION W/PHACO Left 01/23/2023   Procedure: CATARACT EXTRACTION PHACO AND INTRAOCULAR LENS PLACEMENT (IOC);  Surgeon: Harrie Agent, MD;  Location: [facility] ORS;  Service: Ophthalmology;  Laterality: Left;  CDE: 13.69   CATARACT EXTRACTION W/PHACO Right 02/20/2023   Procedure: CATARACT EXTRACTION PHACO AND INTRAOCULAR LENS PLACEMENT (IOC);  Surgeon: Harrie Agent, MD;  Location: [facility] ORS;  Service: Ophthalmology;  Laterality: Right;  CDE: 12.88   CERVICAL SPINE SURGERY     EYE SURGERY     strabismus   INTERCOSTAL NERVE BLOCK Left 06/22/2020   Procedure: INTERCOSTAL NERVE BLOCK;  Surgeon: Kerrin Elspeth BROCKS, MD;  Location: [facility] OR;  Service: Thoracic;  Laterality: Left;   NODE DISSECTION Left 06/22/2020   Procedure: NODE DISSECTION;  Surgeon: Kerrin Elspeth BROCKS, MD;  Location: [facility] OR;  Service: Thoracic;  Laterality: Left;   PACEMAKER IMPLANT N/A 08/18/2024   Procedure: PACEMAKER IMPLANT;  Surgeon: Inocencio Soyla Lunger, MD;  Location: [facility] INVASIVE CV LAB;  Service: Cardiovascular;  Laterality: N/A;   TONSILLECTOMY     VIDEO BRONCHOSCOPY WITH ENDOBRONCHIAL NAVIGATION N/A 05/29/2020   Procedure: VIDEO BRONCHOSCOPY WITH ENDOBRONCHIAL NAVIGATION;  Surgeon: Shelah Lamar RAMAN, MD;  Location: [facility] ENDOSCOPY;  Service: Pulmonary;  Laterality: N/A;     Social History:   reports that he quit smoking about 26 years ago. His smoking use included cigarettes. He started smoking about 73 years ago. He has a 23.5 pack-year smoking history. He has never used smokeless tobacco. He reports that he does not drink alcohol and does not use drugs.   Family History:  His family history includes Cancer in his brother, father, and sister; Diabetes in his brother, brother, brother,  and mother; Parkinson's disease in his brother.   Allergies Allergies[1]   Home Medications  Prior to Admission medications  Medication Sig Start Date End Date Taking? Authorizing Provider  acetaminophen  (TYLENOL ) 500 MG tablet Take 2 tablets (1,000 mg total) by mouth every 6 (six) hours. Patient taking differently: Take 500 mg by mouth daily as needed for headache, moderate pain (pain score 4-6) or mild pain (pain score 1-3). 06/26/20   Lucien Orren SAILOR, PA-C  albuterol  (VENTOLIN  HFA) 108 (90 Base) MCG/ACT inhaler INAHLE 2 PUFFS BY MOUTH 4 TIMES A DAY AS NEEDED FOR COUGH/WHEEZING    [provider]  amoxicillin-clavulanate (AUGMENTIN) 875-125 MG tablet Take 1 tablet by mouth 2 (two) times daily. 10/24/24   [provider]  aspirin  EC 81 MG tablet Take 1 tablet (81 mg total) by mouth daily. Swallow whole. 08/24/24   Henry Manuelita NOVAK, NP  atorvastatin  (LIPITOR ) 80 MG tablet Take 1 tablet (80 mg total) by mouth daily. 09/08/24   Emelia Josefa HERO, NP  azithromycin (ZITHROMAX) 250 MG tablet Take 250 mg by mouth as directed. 10/24/24   [provider]  clopidogrel  (PLAVIX ) 75 MG tablet Take 1 tablet (75 mg total) by mouth daily. 09/08/24   Emelia Josefa HERO, NP  diclofenac  Sodium (VOLTAREN ) 1 % GEL Apply 1 Application topically daily as needed (pain).    [provider]  docusate sodium  (COLACE) 100 MG capsule Take 1 capsule (100 mg total) by mouth 2 (two) times daily as needed for mild constipation. Patient taking differently: Take 100 mg by mouth 2 (two) times a week. 06/26/20   Conte, Tessa N, PA-C  ferrous sulfate 325 (65 FE) MG tablet Take 325  mg by mouth every Monday, Wednesday, and Friday.    [provider]  finasteride  (PROSCAR ) 5 MG tablet Take 5 mg by mouth daily.    [provider]  gabapentin  (NEURONTIN ) 300 MG capsule Take 1 capsule (300 mg total) by mouth 2 (two) times daily. 08/23/24   Akula, Vijaya, MD  glipiZIDE  (GLUCOTROL  XL) 2.5 MG  24 hr tablet Take 1.25 mg by mouth daily as needed (blood sugar above 160). 03/20/20   [provider]  guaiFENesin  (MUCINEX ) 600 MG 12 hr tablet Take 600 mg by mouth 2 (two) times daily.    [provider]  HYDROcodone -acetaminophen  (NORCO/VICODIN) 5-325 MG tablet Take 1 tablet by mouth every 8 (eight) hours as needed. 09/27/24   Williams, Megan E, NP  insulin  aspart (NOVOLOG ) 100 UNIT/ML injection CBG 70 - 120: 0 units  CBG 121 - 150: 2 units  CBG 151 - 200: 3 units  CBG 201 - 250: 5 units  CBG 251 - 300: 8 units  CBG 301 - 350: 11 units  CBG 351 - 400: 15 units 08/23/24   Akula, Vijaya, MD  levothyroxine  (SYNTHROID ) 75 MCG tablet Take 75 mcg by mouth at bedtime. 05/23/21   [provider]  LOKELMA  5 g packet Take 5 g by mouth every Monday, Wednesday, and Friday. 10/08/20   [provider]  Multiple Vitamin (MULTIVITAMIN ADULT PO) Take 1 tablet by mouth at bedtime.    [provider]  Multiple Vitamins-Minerals (PRESERVISION AREDS PO) Take 1 tablet by mouth in the morning and at bedtime.    [provider]  mupirocin ointment (BACTROBAN) 2 % Apply 1 Application topically 3 (three) times daily. 06/30/24   [provider]  nitroGLYCERIN  (NITROSTAT ) 0.4 MG SL tablet Place 1 tablet (0.4 mg total) under the tongue every 5 (five) minutes as needed for chest pain. 09/16/24   Ladona Heinz, MD  polyethylene glycol (MIRALAX  / GLYCOLAX ) 17 g packet Take 17 g by mouth daily as needed (Constipation). 08/23/24   Akula, Vijaya, MD  tamsulosin  (FLOMAX ) 0.4 MG CAPS capsule Take 1 capsule (0.4 mg total) by mouth daily. 08/24/24   Akula, Vijaya, MD  telmisartan (MICARDIS) 20 MG tablet Take 20 mg by mouth. 10/22/24   [provider]  VITAMIN D  PO Take 2 capsules by mouth at bedtime.    [provider]  VITAMIN E PO Take 1 capsule by mouth at bedtime.    [provider]     Critical care time: n/a         Ronnald Gave MSN,  AGACNP-BC [facility] Amion for pager  10/30/2024, 3:06 PM       [1] No Known Allergies  "

## 2024-10-30 NOTE — Evaluation (Signed)
 Occupational Therapy Evaluation Patient Details Name: William Wells MRN: [id number] DOB: [birth year] Today's Date: 10/30/2024   History of Present Illness   Pt is a 89 y.o. M who presents 10/28/2024 with LLL PNA and left pleural effusion. PMH: arthritis, T2DM, HTN, hypothyroidism, lung CA s/p Lt upper lobectomy, CKD, HLD, heart block with pacer.     Clinical Impressions PTA, pt lived with daughter and reports being mod I for BADL. Was active with HH therapies. Upon eval, pt supervision-CGA for OOB ADL and with fair activity tolerance; able to stand at sink for sponge bathing and perform functional mobility throughout hall. Will follow acutely and recommend continued [facility] services at discharge.      If plan is discharge home, recommend the following:   A little help with walking and/or transfers;A little help with bathing/dressing/bathroom;Assistance with cooking/housework;Help with stairs or ramp for entrance;Assist for transportation     Functional Status Assessment   Patient has had a recent decline in their functional status and demonstrates the ability to make significant improvements in function in a reasonable and predictable amount of time.     Equipment Recommendations   None recommended by OT     Recommendations for Other Services         Precautions/Restrictions   Precautions Precautions: Fall Restrictions Weight Bearing Restrictions Per Provider Order: No     Mobility Bed Mobility Overal bed mobility: Modified Independent                  Transfers Overall transfer level: Needs assistance Equipment used: Rollator (4 wheels) Transfers: Sit to/from Stand Sit to Stand: Supervision                  Balance Overall balance assessment: Needs assistance Sitting-balance support: Feet supported Sitting balance-Leahy Scale: Good     Standing balance support: Bilateral upper extremity supported, No upper extremity supported, During functional  activity Standing balance-Leahy Scale: Fair                             ADL either performed or assessed with clinical judgement   ADL Overall ADL's : Needs assistance/impaired Eating/Feeding: Independent;Sitting   Grooming: Supervision/safety;Standing   Upper Body Bathing: Supervision/ safety;Standing   Lower Body Bathing: Contact guard assist;Sit to/from stand (standing by sink)   Upper Body Dressing : Set up;Standing   Lower Body Dressing: Contact guard assist;Sit to/from stand   Toilet Transfer: Ambulation;Rollator (4 wheels);Supervision/safety           Functional mobility during ADLs: Contact guard assist;Rollator (4 wheels)       Vision Baseline Vision/History: 1 Wears glasses Ability to See in Adequate Light: 0 Adequate Patient Visual Report: No change from baseline Vision Assessment?: No apparent visual deficits     Perception         Praxis         Pertinent Vitals/Pain Pain Assessment Pain Assessment: No/denies pain     Extremity/Trunk Assessment Upper Extremity Assessment Upper Extremity Assessment: Overall WFL for tasks assessed   Lower Extremity Assessment Lower Extremity Assessment: Generalized weakness   Cervical / Trunk Assessment Cervical / Trunk Assessment: Normal   Communication Communication Communication: No apparent difficulties   Cognition Arousal: Alert Behavior During Therapy: WFL for tasks assessed/performed  Following commands: Intact       Cueing  General Comments   Cueing Techniques: Verbal cues      Exercises     Shoulder Instructions      Home Living Family/patient expects to be discharged to:: Private residence Living Arrangements: Children Available Help at Discharge: Family;Available PRN/intermittently Type of Home: House Home Access: Ramped entrance     Home Layout: One level     Bathroom Shower/Tub: Producer, Television/film/video:  Standard Bathroom Accessibility: Yes   Home Equipment: Shower seat - built in;Cane - single point;Rollator (4 wheels);Wheelchair - manual          Prior Functioning/Environment Prior Level of Function : Independent/Modified Independent             Mobility Comments: uses cane vs no AD for household distances, Rollator for longer distances ADLs Comments: Ind, daughter manages meds    OT Problem List: Decreased strength;Decreased activity tolerance;Impaired balance (sitting and/or standing);Decreased knowledge of precautions;Decreased knowledge of use of DME or AE   OT Treatment/Interventions: Self-care/ADL training;Therapeutic exercise;DME and/or AE instruction;Therapeutic activities;Patient/family education;Balance training      OT Goals(Current goals can be found in the care plan section)   Acute Rehab OT Goals Patient Stated Goal: get better OT Goal Formulation: With patient Time For Goal Achievement: 11/13/24 Potential to Achieve Goals: Good   OT Frequency:  Min 2X/week    Co-evaluation              AM-PAC OT 6 Clicks Daily Activity     Outcome Measure Help from another person eating meals?: None Help from another person taking care of personal grooming?: A Little Help from another person toileting, which includes using toliet, bedpan, or urinal?: A Little Help from another person bathing (including washing, rinsing, drying)?: A Little Help from another person to put on and taking off regular upper body clothing?: A Little Help from another person to put on and taking off regular lower body clothing?: A Little 6 Click Score: 19   End of Session Equipment Utilized During Treatment: Gait belt;Rollator (4 wheels) Nurse Communication: Mobility status  Activity Tolerance: Patient tolerated treatment well Patient left: in bed;with bed alarm set;with family/visitor present  OT Visit Diagnosis: Unsteadiness on feet (R26.81);Muscle weakness (generalized)  (M62.81)                Time: 9162-9087 OT Time Calculation (min): 35 min Charges:  OT General Charges $OT Visit: 1 Visit OT Evaluation $OT Eval Low Complexity: 1 Low OT Treatments $Self Care/Home Management : 8-22 mins  William Wells, OTR/L [facility] Acute Rehabilitation Office: [phone]   William JONETTA Lebron 10/30/2024, 12:26 PM

## 2024-10-30 NOTE — Progress Notes (Signed)
 " PROGRESS NOTE William Wells  FMW:[id number] DOB: 11-09-1933 DOA: 10/28/2024 PCP: William Norleen PEDLAR, MD  Brief Narrative/Hospital Course: William Wells is a 89 y.o. male with PMH of  HLD,T2DM, CKD 3b, hypothyroidism, CAD, heart block with pacer, stage I adenocarcinoma of the lung S/P LU lobectomy in 2021, and recent pneumonia diagnosis who presented with progressive symptoms despite outpatient treatment. Patient reported that he began developing fatigue, malaise, and productive cough several days ago, seen early in the course at an urgent care where he was diagnosed with pneumonia and started on antibiotics, completing azithromycin 1/23 and still on Augmentin till 1/24, but patient having worsening fatigue with ongoing productive cough malaise and night sweats although no fever or chills or weight loss. He reports right leg swelling for the past couple months but no left leg swelling. In ED: afebrile and saturating well on room air with normal RR, normal HR, and stable BP.  Labs- creatinine 2.00, normal WBC, and hemoglobin 10.2. CT chest WO>>progressive consolidation in the left base-new since prior study, thick-walled basilar fluid collection-suggesting loculated fluid similar to prior study with severe diffuse emphysema. Patient was placed on IV antibiotic and admitted for further management, ultrasound-guided thoracentesis ordered.  Subjective: Seen and examined  Daughter at the bedside Patient feeling much improved overall Overnight remains afebrile on room air , vital stable, labs - stable creatinine 1.7, mild anemia about the same  Assessment and plan:  LLL pneumonia left pleural effusion with loculation Severe emphysema: Recently treated pneumonia and worsening symptoms-finding on CTA showing progressive consolidation and loculated fluid Vital stable no leukocytosis.US -guided thoracentesis ordered and pending-consulted pulmonary team to evaluate given his loculation. Strep pneumo antigen  negative, Legionella antigen, sputum culture, blood culture is pending. Cont Ceftriaxone  doxycycline .  CAD   No chest pain, continue ASA, Plavix , and Lipitor      HTN: Blood pressure well-controlled. Home telmisartan on hold  Type II DM  A1c was 7.1% in October 2025, diabetes well-controlled. Monitor Recent Labs  Lab 10/29/24 1630 10/29/24 2031 10/30/24 0016 10/30/24 0417 10/30/24 0736  GLUCAP 129* 149* 158* 119* 133*    CKD 3B:  Remains stable.  Monitor renal function intermittently   Hypothyroidism  Continue Synthroid .    Chronic anemia: Mild drop in hemoglobin around 9 to 11 g, monitor check anemia panel in a.m.  Mildly elevated troponin: Flat in 19> 19 likely demand ischemia in the setting of pneumonia  RLE swelling  Venous Doppler is negative.  BPH: Cont finasteride   Deconditioning/debility: Mobilize with PT OT PT Orders: Active PT Follow up Rec: Home Health Pt1/24/2026 1300    DVT prophylaxis: enoxaparin  (LOVENOX ) injection 40 mg Start: 10/29/24 1215 SCDs Start: 10/29/24 0046  Code Status:   Code Status: Limited: Do not attempt resuscitation (DNR) -DNR-LIMITED -Do Not Intubate/DNI  Family Communication: plan of care discussed with patient and his daughter at bedside. Patient status is: Remains hospitalized because of severity of illness Level of care: Telemetry   Dispo: The patient is from: Home            Anticipated disposition: TBD Objective: Vitals last 24 hrs: Vitals:   10/29/24 2001 10/30/24 0017 10/30/24 0419 10/30/24 0735  BP: (!) 111/59 (!) 117/47 125/72 126/70  Pulse: 77 81 74 73  Resp: 17 17 18    Temp: 98.7 F (37.1 C) 98.4 F (36.9 C) 97.7 F (36.5 C) 97.9 F (36.6 C)  TempSrc: Oral Oral Oral   SpO2: 98% 97% 98% 99%  Weight:  Height:       Physical Examination: William exam: AAOX3, pleasant HEENT:Oral mucosa moist, Ear/Nose WNL grossly Respiratory system: Diminished BS left more than right,  Cardiovascular system: S1 & S2 +,  No JVD. Gastrointestinal system: Abdomen soft,NT,ND, BS+ Nervous System: Alert, awake, non focal Extremities: extremities warm, leg edema + on right lower extremity Skin: Warm, no rashes MSK: Normal muscle bulk,tone, power   Medications reviewed:  Scheduled Meds:  aspirin  EC  81 mg Oral Daily   atorvastatin   80 mg Oral Daily   clopidogrel   75 mg Oral Daily   doxycycline   100 mg Oral Q12H   enoxaparin  (LOVENOX ) injection  40 mg Subcutaneous Q24H   finasteride   5 mg Oral Daily   gabapentin   300 mg Oral BID   insulin  aspart  0-6 Units Subcutaneous Q4H   levothyroxine   75 mcg Oral QHS   sodium chloride  flush  3 mL Intravenous Q12H   tamsulosin   0.4 mg Oral Daily   Continuous Infusions:  cefTRIAXone  (ROCEPHIN )  IV 2 g (10/30/24 1049)   Diet: Diet Order             Diet Carb Modified Room service appropriate? Yes  Diet effective now                      Unresulted Labs (From admission, onward)     Start     Ordered   11/05/24 0500  Creatinine, serum  (enoxaparin  (LOVENOX )    CrCl >/= 30 ml/min)  Weekly,   R     Comments: while on enoxaparin  therapy    10/29/24 1116   10/30/24 0816  Vitamin B12  (Anemia Panel (PNL))  Add-on,   AD        10/30/24 0816   10/30/24 0816  Folate  (Anemia Panel (PNL))  Add-on,   AD        10/30/24 0816   10/30/24 0816  Iron and TIBC  (Anemia Panel (PNL))  Add-on,   AD        10/30/24 0816   10/30/24 0816  Ferritin  (Anemia Panel (PNL))  Add-on,   AD        10/30/24 0816   10/29/24 0500  Basic metabolic panel  Daily,   R      10/29/24 0049   10/29/24 0500  CBC  Daily,   R      10/29/24 0049   10/29/24 0047  Legionella Pneumophila Serogp 1 Ur Ag  (COPD / Pneumonia / Cellulitis / Lower Extremity Wound (Diabetic Foot Infection))  Once,   R        10/29/24 0049   10/29/24 0047  Expectorated Sputum Assessment w Gram Stain, Rflx to Resp Cult  (COPD / Pneumonia / Cellulitis / Lower Extremity Wound (Diabetic Foot Infection))  Once,   R         10/29/24 0049           Data Reviewed: I have personally reviewed following labs and imaging studies ( see epic result tab) CBC: Recent Labs  Lab 10/28/24 2049 10/29/24 0431 10/30/24 0616  WBC 7.9 7.4 8.2  HGB 10.2* 9.1* 9.4*  HCT 32.0* 27.6* 28.6*  MCV 89.1 86.8 86.7  PLT 221 188 204   CMP: Recent Labs  Lab 10/28/24 2049 10/29/24 0431 10/30/24 0616  NA 140 139 136  K 4.8 4.1 4.5  CL 103 105 104  CO2 26 25 24   GLUCOSE 104* 118* 172*  BUN  30* 27* 26*  CREATININE 2.00* 1.78* 1.75*  CALCIUM  9.5 9.1 9.2   GFR: Estimated Creatinine Clearance: 28.2 mL/min (A) (by C-G formula based on SCr of 1.75 mg/dL (H)). Recent Labs  Lab 10/29/24 0431  AST 21  ALT 33  ALKPHOS 65  BILITOT 0.3  PROT 6.8  ALBUMIN  3.3*   No results for input(s): LIPASE, AMYLASE in the last 168 hours. No results for input(s): AMMONIA in the last 168 hours. Coagulation Profile: No results for input(s): INR, PROTIME in the last 168 hours. Antimicrobials/Microbiology: Anti-infectives (From admission, onward)    Start     Dose/Rate Route Frequency Ordered Stop   10/29/24 1000  cefTRIAXone  (ROCEPHIN ) 2 g in sodium chloride  0.9 % 100 mL IVPB        2 g 200 mL/hr over 30 Minutes Intravenous Every 24 hours 10/29/24 0049 11/02/24 0959   10/29/24 1000  doxycycline  (VIBRA -TABS) tablet 100 mg        100 mg Oral Every 12 hours 10/29/24 0049     10/28/24 2245  cefTRIAXone  (ROCEPHIN ) 1 g in sodium chloride  0.9 % 100 mL IVPB        1 g 200 mL/hr over 30 Minutes Intravenous  Once 10/28/24 2230 10/28/24 2342   10/28/24 2245  doxycycline  (VIBRA -TABS) tablet 100 mg        100 mg Oral  Once 10/28/24 2230 10/28/24 2311         Component Value Date/Time   SDES BLOOD SITE NOT SPECIFIED 08/08/2024 1453   SPECREQUEST  08/08/2024 1453    BOTTLES DRAWN AEROBIC AND ANAEROBIC Blood Culture adequate volume   CULT  08/08/2024 1453    NO GROWTH 5 DAYS Performed at [facility], [address],  [place], [place] [address]    REPTSTATUS 08/13/2024 FINAL 08/08/2024 1453   Procedures:  Mennie LAMY, MD [facility] 10/30/2024, 10:52 AM   "

## 2024-10-30 NOTE — Plan of Care (Signed)

## 2024-10-31 ENCOUNTER — Other Ambulatory Visit (HOSPITAL_COMMUNITY): Payer: Self-pay

## 2024-10-31 DIAGNOSIS — J189 Pneumonia, unspecified organism: Secondary | ICD-10-CM | POA: Diagnosis not present

## 2024-10-31 LAB — CBC
HCT: 27.8 % — ABNORMAL LOW (ref 39.0–52.0)
Hemoglobin: 9.3 g/dL — ABNORMAL LOW (ref 13.0–17.0)
MCH: 29.1 pg (ref 26.0–34.0)
MCHC: 33.5 g/dL (ref 30.0–36.0)
MCV: 86.9 fL (ref 80.0–100.0)
Platelets: 197 10*3/uL (ref 150–400)
RBC: 3.2 MIL/uL — ABNORMAL LOW (ref 4.22–5.81)
RDW: 15.1 % (ref 11.5–15.5)
WBC: 7.5 10*3/uL (ref 4.0–10.5)
nRBC: 0 % (ref 0.0–0.2)

## 2024-10-31 LAB — GLUCOSE, CAPILLARY
Glucose-Capillary: 115 mg/dL — ABNORMAL HIGH (ref 70–99)
Glucose-Capillary: 127 mg/dL — ABNORMAL HIGH (ref 70–99)
Glucose-Capillary: 155 mg/dL — ABNORMAL HIGH (ref 70–99)
Glucose-Capillary: 173 mg/dL — ABNORMAL HIGH (ref 70–99)

## 2024-10-31 LAB — BASIC METABOLIC PANEL WITH GFR
Anion gap: 10 (ref 5–15)
BUN: 31 mg/dL — ABNORMAL HIGH (ref 8–23)
CO2: 24 mmol/L (ref 22–32)
Calcium: 8.8 mg/dL — ABNORMAL LOW (ref 8.9–10.3)
Chloride: 103 mmol/L (ref 98–111)
Creatinine, Ser: 1.61 mg/dL — ABNORMAL HIGH (ref 0.61–1.24)
GFR, Estimated: 41 mL/min — ABNORMAL LOW
Glucose, Bld: 151 mg/dL — ABNORMAL HIGH (ref 70–99)
Potassium: 4 mmol/L (ref 3.5–5.1)
Sodium: 138 mmol/L (ref 135–145)

## 2024-10-31 MED ORDER — DOXYCYCLINE HYCLATE 100 MG PO TABS
100.0000 mg | ORAL_TABLET | Freq: Two times a day (BID) | ORAL | 0 refills | Status: AC
  Filled 2024-10-31: qty 6, 3d supply, fill #0

## 2024-10-31 MED ORDER — CEFDINIR 300 MG PO CAPS
300.0000 mg | ORAL_CAPSULE | Freq: Two times a day (BID) | ORAL | 0 refills | Status: AC
  Filled 2024-10-31: qty 6, 3d supply, fill #0
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
## 2024-10-31 NOTE — Discharge Summary (Signed)
 Physician Discharge Summary  William Wells FMW:[id number] DOB: 17-Nov-1933 DOA: 10/28/2024  PCP: Shona Norleen PEDLAR, MD  Admit date: 10/28/2024 Discharge date: 10/31/2024 Recommendations for Outpatient Follow-up:  Follow up with PCP in 1 weeks-call for appointment Please obtain BMP/CBC in one week  Discharge Dispo: home w/ hh Discharge Condition: Stable Code Status:   Code Status: Limited: Do not attempt resuscitation (DNR) -DNR-LIMITED -Do Not Intubate/DNI  Diet recommendation:  Diet Order             Diet Carb Modified Room service appropriate? Yes  Diet effective now                    Brief/Interim Summary: William Wells is a 89 y.o. male with PMH of  HLD,T2DM, CKD 3b, hypothyroidism, CAD, heart block with pacer, stage I adenocarcinoma of the lung S/P LU lobectomy in 2021, and recent pneumonia diagnosis who presented with progressive symptoms despite outpatient treatment. Patient reported that he began developing fatigue, malaise, and productive cough several days ago, seen early in the course at an urgent care where he was diagnosed with pneumonia and started on antibiotics, completing azithromycin 1/23 and still on Augmentin till 1/24, but patient having worsening fatigue with ongoing productive cough malaise and night sweats although no fever or chills or weight loss. He reports right leg swelling for the past couple months but no left leg swelling. In ED: afebrile and saturating well on room air with normal RR, normal HR, and stable BP.  Labs- creatinine 2.00, normal WBC, and hemoglobin 10.2. CT chest WO>>progressive consolidation in the left base-new since prior study, thick-walled basilar fluid collection-suggesting loculated fluid similar to prior study with severe diffuse emphysema. Patient was placed on IV antibiotic and admitted for further management, ultrasound-guided thoracentesis ordered. New finding appears to be left lower lobe ground glass opacity that is very subtle and is  new compared to the CT scan in October 2025. He follows with Dr. Belva for his lung cancer and neck CT scan coming up in July 2025. Patient with classic presentation of infectious pneumonia with fever chills that has now improved. Ultrasound-guided thoracentesis showed scarring, seen by pulmonary, at this time advised to complete antibiotic course, he is renal panel was positive. Overall patient is clinically improved and will plan to discharge home He has a follow-up with Dr. Sherrod in July 2026 for CT scan  Subjective: Seen and examined  Daughter at the bedside no new complaints. Overnight vitals stable afebrile labs with creatinine further improved 1.6 mild chronic anemia with stable folate B12 and slightly low iron  Patient and family are agreeable for discharge today  Discharge Diagnoses:   LLL pneumonia left pleural effusion with loculation-chronic left fibrothorax since 2022 Severe emphysema Rhinovirus infection: Patient is clinically improved following IV antibiotics.  Seen by IR no fluid to drain, per pulmonary patient has chronic left fibrothorax since 2022 no changes, at this time no further intervention needed LLL GGO's new indicating infectious etiology. Overall clinically improving we will discharge on p.o. antibiotics,cont bronchodilators and patient follow-up with his pulmonary team  CAD   No chest pain, continue ASA, Plavix , and Lipitor      HTN: BPl-controlled. Resume telmisartan on discharge.  Type II DM  A1c was 7.1% in October 2025, diabetes well-controlled. Monitor Recent Labs  Lab 10/30/24 2022 10/31/24 0037 10/31/24 0442 10/31/24 0743 10/31/24 1135  GLUCAP 143* 155* 127* 173* 115*    CKD 3B:  Remains stable. Monitor renal function intermittently  Recent Labs    08/19/24 0218 08/20/24 0225 08/21/24 0244 08/22/24 0238 09/08/24 1542 09/14/24 0938 10/28/24 2049 10/29/24 0431 10/30/24 0616 10/31/24 0018  BUN 37* 37* 37* 41* 37* 21 30* 27* 26*  31*  CREATININE 2.19* 2.32* 2.29* 2.32* 1.95* 1.84* 2.00* 1.78* 1.75* 1.61*  CO2 20* 24 24 21* 23 23 26 25 24 24   K 5.6* 4.9 4.1 4.6 5.2 3.9 4.8 4.1 4.5 4.0    Hypothyroidism  Continue Synthroid .    Chronic anemia: Mild drop in hemoglobin around 9 to 11 g, monitor check anemia panel in a.m.  Mildly elevated troponin: Flat in 19> 19 likely demand ischemia in the setting of pneumonia  RLE swelling  Venous Doppler is negative.  BPH: Cont finasteride   Deconditioning/debility: Mobilize with PT OT PT Orders: Active PT Follow up Rec: Home Health Pt1/24/2026 1300    DVT prophylaxis: enoxaparin  (LOVENOX ) injection 40 mg Start: 10/29/24 1215 SCDs Start: 10/29/24 0046  Code Status:   Code Status: Limited: Do not attempt resuscitation (DNR) -DNR-LIMITED -Do Not Intubate/DNI  Family Communication: plan of care discussed with patient and his daughter at bedside. Patient status is: Remains hospitalized because of severity of illness Level of care: Telemetry   Dispo: The patient is from: Home            Anticipated disposition: HOME W/ HH  Objective: Vitals last 24 hrs: Vitals:   10/31/24 0023 10/31/24 0443 10/31/24 0743 10/31/24 1135  BP: (!) 109/58 (!) 114/56 130/64 (!) 119/57  Pulse: 85 77 79 71  Resp:  16    Temp: 98 F (36.7 C)  98.5 F (36.9 C) 97.8 F (36.6 C)  TempSrc:      SpO2: 98% 99% 97% 100%  Weight:      Height:       Physical Examination: General exam: AAOX3 HEENT:Oral mucosa moist, Ear/Nose WNL grossly Respiratory system: Diminished BS left more than right,  Cardiovascular system: S1 & S2 +, No JVD. Gastrointestinal system: Abdomen soft,NT,ND, BS+ Nervous System: Alert, awake, non focal Extremities: extremities warm, leg edema + on right lower extremity Skin: Warm, no rashes MSK: Normal muscle bulk,tone, power    Pressure Ulcer: Wound 10/29/24 0930 Pressure Injury Buttocks Bilateral Stage 1 -  Intact skin with non-blanchable redness of a localized area  usually over a bony prominence. (Active)      Consultation: See note.  Discharge Instructions  Discharge Instructions     Discharge instructions   Complete by: As directed    Please call call MD or return to ER for similar or worsening recurring problem that brought you to hospital or if any fever,nausea/vomiting,abdominal pain, uncontrolled pain, chest pain,  shortness of breath or any other alarming symptoms.  Please follow-up your doctor as instructed in a week time and call the office for appointment.  Please avoid alcohol, smoking, or any other illicit substance and maintain healthy habits including taking your regular medications as prescribed.  You were cared for by a hospitalist during your hospital stay. If you have any questions about your discharge medications or the care you received while you were in the hospital after you are discharged, you can call the unit and ask to speak with the hospitalist on call if the hospitalist that took care of you is not available.  Once you are discharged, your primary care physician will handle any further medical issues. Please note that NO REFILLS for any discharge medications will be authorized once you are discharged, as it is  imperative that you return to your primary care physician (or establish a relationship with a primary care physician if you do not have one) for your aftercare needs so that they can reassess your need for medications and monitor your lab values   Increase activity slowly   Complete by: As directed    No wound care   Complete by: As directed       Allergies as of 10/31/2024   No Known Allergies      Medication List     STOP taking these medications    amoxicillin-clavulanate 875-125 MG tablet Commonly known as: AUGMENTIN   azithromycin 250 MG tablet Commonly known as: ZITHROMAX       TAKE these medications    acetaminophen  500 MG tablet Commonly known as: TYLENOL  Take 500 mg by mouth daily as  needed for moderate pain (pain score 4-6), mild pain (pain score 1-3) or headache.   albuterol  108 (90 Base) MCG/ACT inhaler Commonly known as: VENTOLIN  HFA INAHLE 2 PUFFS BY MOUTH 4 TIMES A DAY AS NEEDED FOR COUGH/WHEEZING   aspirin  EC 81 MG tablet Take 1 tablet (81 mg total) by mouth daily. Swallow whole.   atorvastatin  80 MG tablet Commonly known as: LIPITOR  Take 1 tablet (80 mg total) by mouth daily.   cefdinir  300 MG capsule Commonly known as: OMNICEF  Take 1 capsule (300 mg total) by mouth 2 (two) times daily for 3 days.   clopidogrel  75 MG tablet Commonly known as: PLAVIX  Take 1 tablet (75 mg total) by mouth daily.   docusate sodium  100 MG capsule Commonly known as: COLACE Take 1 capsule (100 mg total) by mouth 2 (two) times daily as needed for mild constipation. What changed: when to take this   doxycycline  100 MG tablet Commonly known as: VIBRA -TABS Take 1 tablet (100 mg total) by mouth every 12 (twelve) hours for 3 days.   ferrous sulfate 325 (65 FE) MG tablet Take 325 mg by mouth every Monday, Wednesday, and Friday.   finasteride  5 MG tablet Commonly known as: PROSCAR  Take 5 mg by mouth daily.   gabapentin  300 MG capsule Commonly known as: NEURONTIN  Take 1 capsule (300 mg total) by mouth 2 (two) times daily.   glipiZIDE  2.5 MG 24 hr tablet Commonly known as: GLUCOTROL  XL Take 1.25 mg by mouth daily as needed (blood sugar above 160).   guaiFENesin  600 MG 12 hr tablet Commonly known as: MUCINEX  Take 600 mg by mouth 2 (two) times daily.   HYDROcodone -acetaminophen  5-325 MG tablet Commonly known as: NORCO/VICODIN Take 1 tablet by mouth every 8 (eight) hours as needed.   insulin  aspart 100 UNIT/ML injection Commonly known as: novoLOG  CBG 70 - 120: 0 units  CBG 121 - 150: 2 units  CBG 151 - 200: 3 units  CBG 201 - 250: 5 units  CBG 251 - 300: 8 units  CBG 301 - 350: 11 units  CBG 351 - 400: 15 units   levothyroxine  75 MCG tablet Commonly known as:  SYNTHROID  Take 75 mcg by mouth at bedtime.   Lokelma  5 g packet Generic drug: sodium zirconium cyclosilicate  Take 5 g by mouth every Monday, Wednesday, and Friday.   MULTIVITAMIN ADULT PO Take 1 tablet by mouth at bedtime.   mupirocin ointment 2 % Commonly known as: BACTROBAN Apply 1 Application topically 3 (three) times daily.   nitroGLYCERIN  0.4 MG SL tablet Commonly known as: NITROSTAT  Place 1 tablet (0.4 mg total) under the tongue every 5 (five) minutes  as needed for chest pain.   pantoprazole  20 MG tablet Commonly known as: PROTONIX  Take 20 mg by mouth daily.   polyethylene glycol 17 g packet Commonly known as: MIRALAX  / GLYCOLAX  Take 17 g by mouth daily as needed (Constipation).   PRESERVISION AREDS PO Take 1 tablet by mouth in the morning and at bedtime.   tamsulosin  0.4 MG Caps capsule Commonly known as: FLOMAX  Take 1 capsule (0.4 mg total) by mouth daily.   telmisartan 20 MG tablet Commonly known as: MICARDIS Take 10 mg by mouth daily.   VITAMIN D  PO Take 2 capsules by mouth at bedtime.   VITAMIN E PO Take 1 capsule by mouth at bedtime.   Voltaren  1 % Gel Generic drug: diclofenac  Sodium Apply 1 Application topically daily as needed (pain).        Contact information for follow-up providers     Shona Norleen PEDLAR, MD Follow up in 1 week(s).   Specialty: Internal Medicine Contact information: [address] [place] [address] [phone]         Care, [facility] Follow up.   Why: [facility] will continue to provide home health services. Contact information: [address] [place] [address] [phone]              Contact information for after-discharge care     Home Medical Care     [facility]) .   Service: Home Health Services                    Allergies[1]  The results of significant diagnostics from this hospitalization (including imaging, microbiology,  ancillary and laboratory) are listed below for reference.    Microbiology: Recent Results (from the past 240 hours)  Respiratory (~20 pathogens) panel by PCR     Status: Abnormal   Collection Time: 10/30/24  6:48 PM   Specimen: Nasopharyngeal Swab; Respiratory  Result Value Ref Range Status   Adenovirus NOT DETECTED NOT DETECTED Final   Coronavirus 229E NOT DETECTED NOT DETECTED Final    Comment: (NOTE) The Coronavirus on the Respiratory Panel, DOES NOT test for the novel  Coronavirus (2019 nCoV)    Coronavirus HKU1 NOT DETECTED NOT DETECTED Final   Coronavirus NL63 NOT DETECTED NOT DETECTED Final   Coronavirus OC43 NOT DETECTED NOT DETECTED Final   Metapneumovirus NOT DETECTED NOT DETECTED Final   Rhinovirus / Enterovirus DETECTED (A) NOT DETECTED Final   Influenza A NOT DETECTED NOT DETECTED Final   Influenza B NOT DETECTED NOT DETECTED Final   Parainfluenza Virus 1 NOT DETECTED NOT DETECTED Final   Parainfluenza Virus 2 NOT DETECTED NOT DETECTED Final   Parainfluenza Virus 3 NOT DETECTED NOT DETECTED Final   Parainfluenza Virus 4 NOT DETECTED NOT DETECTED Final   Respiratory Syncytial Virus NOT DETECTED NOT DETECTED Final   Bordetella pertussis NOT DETECTED NOT DETECTED Final   Bordetella Parapertussis NOT DETECTED NOT DETECTED Final   Chlamydophila pneumoniae NOT DETECTED NOT DETECTED Final   Mycoplasma pneumoniae NOT DETECTED NOT DETECTED Final    Comment: Performed at [facility], [address] [place], [place] [address]    Procedures/Studies: US  CHEST (PLEURAL EFFUSION) Result Date: 10/30/2024 INDICATION: History of left-sided lung cancer, status post left upper lobectomy in 2021. CT chest 10/28/2024 with a thick walled left basilar pleural collection suggesting loculated fluid. Request for left thoracentesis. EXAM: LEFT CHEST ULTRASOUND COMPARISON:  None Available. FINDINGS: Trace left-sided  pleural effusion. No significant pocket of fluid or percutaneous window to  allow safe thoracentesis. Unable to perform procedure. IMPRESSION: Trace left-sided pleural effusion with no safe window for percutaneous access Performed by: Kimble Clas, PA-C Electronically Signed   By: CHRISTELLA.  Shick M.D.   On: 10/30/2024 13:45   VAS US  LOWER EXTREMITY VENOUS (DVT) Result Date: 10/30/2024  Lower Venous DVT Study Patient Name:  William Wells  Date of Exam:   10/30/2024 Medical Rec #: [id number]      Accession #:    [id number] Date of Birth: 10-13-1933       Patient Gender: M Patient Age:   26 years Exam Location:  [facility] Procedure:      VAS US  LOWER EXTREMITY VENOUS (DVT) Referring Phys: TIMOTHY OPYD --------------------------------------------------------------------------------  Indications: Edema.  Comparison Study: No previous exams Performing Technologist: Jody Hill RVT, RDMS  Examination Guidelines: A complete evaluation includes B-mode imaging, spectral Doppler, color Doppler, and power Doppler as needed of all accessible portions of each vessel. Bilateral testing is considered an integral part of a complete examination. Limited examinations for reoccurring indications may be performed as noted. The reflux portion of the exam is performed with the patient in reverse Trendelenburg.  +---------+---------------+---------+-----------+----------+--------------+ RIGHT    CompressibilityPhasicitySpontaneityPropertiesThrombus Aging +---------+---------------+---------+-----------+----------+--------------+ CFV      Full           Yes      Yes                                 +---------+---------------+---------+-----------+----------+--------------+ SFJ      Full                                                        +---------+---------------+---------+-----------+----------+--------------+ FV Prox  Full           Yes      Yes                                 +---------+---------------+---------+-----------+----------+--------------+ FV Mid   Full            Yes      Yes                                 +---------+---------------+---------+-----------+----------+--------------+ FV DistalFull           Yes      Yes                                 +---------+---------------+---------+-----------+----------+--------------+ PFV      Full                                                        +---------+---------------+---------+-----------+----------+--------------+ POP      Full           Yes      Yes                                 +---------+---------------+---------+-----------+----------+--------------+  PTV      Full                                                        +---------+---------------+---------+-----------+----------+--------------+ PERO     Full                               dilated                  +---------+---------------+---------+-----------+----------+--------------+   +----+---------------+---------+-----------+----------+--------------+ LEFTCompressibilityPhasicitySpontaneityPropertiesThrombus Aging +----+---------------+---------+-----------+----------+--------------+ CFV Full           Yes      Yes                                 +----+---------------+---------+-----------+----------+--------------+     Summary: RIGHT: - There is no evidence of deep vein thrombosis in the lower extremity.  - No cystic structure found in the popliteal fossa.  LEFT: - No evidence of common femoral vein obstruction.   *See table(s) above for measurements and observations. Electronically signed by Norman Serve on 10/30/2024 at 10:11:01 AM.    Final    CT Chest Wo Contrast Result Date: 10/28/2024 EXAM: CT CHEST WITHOUT CONTRAST 10/28/2024 10:48:53 PM TECHNIQUE: CT of the chest was performed without the administration of intravenous contrast. Multiplanar reformatted images are provided for review. Automated exposure control, iterative reconstruction, and/or weight based adjustment of the mA/kV was utilized to  reduce the radiation dose to as low as reasonably achievable. COMPARISON: Chest radiograph 10/28/2024 and CT chest 08/05/2024. CLINICAL HISTORY: Productive cough and generalized weakness for 5 days. FINDINGS: MEDIASTINUM: Cardiac pacemaker. Mild cardiac enlargement. Minimal pericardial effusion. Calcification of the aorta and coronary arteries. No aneurysm. The central airways are clear. Small esophageal hiatal hernia. The esophagus is decompressed. The thyroid  gland is atrophic. LYMPH NODES: No significant lymphadenopathy in the chest. LUNGS AND PLEURA: Severe diffuse emphysematous changes throughout the lungs. Postoperative partial left lung resection. Consolidation in the left lung base is progressing since the prior study. Thick-walled left basilar pleural collection suggesting loculated fluid. The appearance is similar to the prior study. No pulmonary edema. No pneumothorax. SOFT TISSUES/BONES: Degenerative changes in the spine. No acute bony abnormalities. UPPER ABDOMEN: No acute abnormalities demonstrated in the visualized upper abdomen. IMPRESSION: 1. Progression of consolidation in the left lung base since the prior study. 2. Thick-walled left basilar pleural collection suggesting loculated fluid, similar to the prior study. 3. Severe diffuse emphysematous changes throughout the lungs. Electronically signed by: Elsie Gravely MD 10/28/2024 10:56 PM EST RP Workstation: [id number]   DG Chest 2 View Result Date: 10/28/2024 EXAM: 2 VIEW(S) XRAY OF THE CHEST 10/28/2024 09:05:00 PM COMPARISON: 09/14/2024 CLINICAL HISTORY: Shortness of breath. FINDINGS: LINES, TUBES AND DEVICES: Left upper chest pacemaker in place. LUNGS AND PLEURA: Persistent left lung base opacity with left pleural effusion. No pneumothorax. HEART AND MEDIASTINUM: Calcified tortuous aorta. No acute abnormality of the cardiac silhouette. BONES AND SOFT TISSUES: Left thoracotomy noted. Degenerative changes of spine. No acute osseous  abnormality. IMPRESSION: 1. Persistent left lung base opacity with left pleural effusion. Electronically signed by: Oneil Devonshire MD 10/28/2024 09:12 PM EST RP Workstation: [id number]   XR C-ARM NO REPORT Result Date: 10/14/2024 Please see Notes  tab for imaging impression.  Epidural Steroid injection Result Date: 10/14/2024 Eldonna Novel, MD     10/14/2024 10:37 AM Lumbosacral Transforaminal Epidural Steroid Injection - Sub-Pedicular Approach with Fluoroscopic Guidance Patient: William Wells     Date of Birth: Mar 29, 1934 MRN: [id number] PCP: Shona Norleen PEDLAR, MD     Visit Date: 10/14/2024  Universal Protocol:   Date/Time: 10/14/2024 Consent Given By: the patient Position: PRONE Additional Comments: Vital signs were monitored before and after the procedure. Patient was prepped and draped in the usual sterile fashion. The correct patient, procedure, and site was verified. Injection Procedure Details: Procedure diagnoses: Lumbar radiculopathy [M54.16]  Meds Administered: Meds ordered this encounter Medications  methylPREDNISolone  acetate (DEPO-MEDROL ) injection 40 mg Laterality: Bilateral Location/Site: L4 Needle:5.0 in., 22 ga.  Short bevel or Quincke spinal needle Needle Placement: Transforaminal Findings:   -Comments: Excellent flow of contrast along the nerve, nerve root and into the epidural space. Procedure Details: After squaring off the end-plates to get a true AP view, the C-arm was positioned so that an oblique view of the foramen as noted above was visualized. The target area is just inferior to the nose of the scotty dog or sub pedicular. The soft tissues overlying this structure were infiltrated with 2-3 ml. of 1% Lidocaine  without Epinephrine . The spinal needle was inserted toward the target using a trajectory view along the fluoroscope beam.  Under AP and lateral visualization, the needle was advanced so it did not puncture dura and was located close the 6 O'Clock position of the pedical in AP  tracterory. Biplanar projections were used to confirm position. Aspiration was confirmed to be negative for CSF and/or blood. A 1-2 ml. volume of Isovue -250 was injected and flow of contrast was noted at each level. Radiographs were obtained for documentation purposes. After attaining the desired flow of contrast documented above, a 0.5 to 1.0 ml test dose of 0.25% Marcaine  was injected into each respective transforaminal space.  The patient was observed for 90 seconds post injection.  After no sensory deficits were reported, and normal lower extremity motor function was noted,   the above injectate was administered so that equal amounts of the injectate were placed at each foramen (level) into the transforaminal epidural space. Additional Comments: The patient tolerated the procedure well Dressing: 2 x 2 sterile gauze and Band-Aid  Post-procedure details: Patient was observed during the procedure. Post-procedure instructions were reviewed. Patient left the clinic in stable condition.    Labs: BNP (last 3 results) No results for input(s): BNP in the last 8760 hours. Basic Metabolic Panel: Recent Labs  Lab 10/28/24 2049 10/29/24 0431 10/30/24 0616 10/31/24 0018  NA 140 139 136 138  K 4.8 4.1 4.5 4.0  CL 103 105 104 103  CO2 26 25 24 24   GLUCOSE 104* 118* 172* 151*  BUN 30* 27* 26* 31*  CREATININE 2.00* 1.78* 1.75* 1.61*  CALCIUM  9.5 9.1 9.2 8.8*   Liver Function Tests: Recent Labs  Lab 10/29/24 0431  AST 21  ALT 33  ALKPHOS 65  BILITOT 0.3  PROT 6.8  ALBUMIN  3.3*   No results for input(s): LIPASE, AMYLASE in the last 168 hours. No results for input(s): AMMONIA in the last 168 hours. CBC: Recent Labs  Lab 10/28/24 2049 10/29/24 0431 10/30/24 0616 10/31/24 0018  WBC 7.9 7.4 8.2 7.5  HGB 10.2* 9.1* 9.4* 9.3*  HCT 32.0* 27.6* 28.6* 27.8*  MCV 89.1 86.8 86.7 86.9  PLT 221 188 204 197   CBG:  Recent Labs  Lab 10/30/24 2022 10/31/24 0037 10/31/24 0442  10/31/24 0743 10/31/24 1135  GLUCAP 143* 155* 127* 173* 115*   Hgb A1c No results for input(s): HGBA1C in the last 72 hours. Anemia work up Recent Labs    10/30/24 0618 10/30/24 1631  VITAMINB12  --  1,036*  FOLATE  --  >20.0  FERRITIN  --  273  TIBC  --  228*  IRON  --  25*  RETICCTPCT 1.1  --     Cardiac Enzymes: No results for input(s): CKTOTAL, CKMB, CKMBINDEX, TROPONINI in the last 168 hours. BNP: Invalid input(s): POCBNP D-Dimer No results for input(s): DDIMER in the last 72 hours. Lipid Profile No results for input(s): CHOL, HDL, LDLCALC, TRIG, CHOLHDL, LDLDIRECT in the last 72 hours. Thyroid  function studies No results for input(s): TSH, T4TOTAL, T3FREE, THYROIDAB in the last 72 hours.  Invalid input(s): FREET3 Urinalysis    Component Value Date/Time   COLORURINE YELLOW 08/10/2024 1300   APPEARANCEUR CLEAR 08/10/2024 1300   LABSPEC 1.008 08/10/2024 1300   PHURINE 5.0 08/10/2024 1300   GLUCOSEU 150 (A) 08/10/2024 1300   HGBUR MODERATE (A) 08/10/2024 1300   BILIRUBINUR NEGATIVE 08/10/2024 1300   KETONESUR NEGATIVE 08/10/2024 1300   PROTEINUR NEGATIVE 08/10/2024 1300   UROBILINOGEN 0.2 04/22/2008 1500   NITRITE NEGATIVE 08/10/2024 1300   LEUKOCYTESUR NEGATIVE 08/10/2024 1300   Sepsis Labs Recent Labs  Lab 10/28/24 2049 10/29/24 0431 10/30/24 0616 10/31/24 0018  WBC 7.9 7.4 8.2 7.5   Microbiology Recent Results (from the past 240 hours)  Respiratory (~20 pathogens) panel by PCR     Status: Abnormal   Collection Time: 10/30/24  6:48 PM   Specimen: Nasopharyngeal Swab; Respiratory  Result Value Ref Range Status   Adenovirus NOT DETECTED NOT DETECTED Final   Coronavirus 229E NOT DETECTED NOT DETECTED Final    Comment: (NOTE) The Coronavirus on the Respiratory Panel, DOES NOT test for the novel  Coronavirus (2019 nCoV)    Coronavirus HKU1 NOT DETECTED NOT DETECTED Final   Coronavirus NL63 NOT DETECTED NOT  DETECTED Final   Coronavirus OC43 NOT DETECTED NOT DETECTED Final   Metapneumovirus NOT DETECTED NOT DETECTED Final   Rhinovirus / Enterovirus DETECTED (A) NOT DETECTED Final   Influenza A NOT DETECTED NOT DETECTED Final   Influenza B NOT DETECTED NOT DETECTED Final   Parainfluenza Virus 1 NOT DETECTED NOT DETECTED Final   Parainfluenza Virus 2 NOT DETECTED NOT DETECTED Final   Parainfluenza Virus 3 NOT DETECTED NOT DETECTED Final   Parainfluenza Virus 4 NOT DETECTED NOT DETECTED Final   Respiratory Syncytial Virus NOT DETECTED NOT DETECTED Final   Bordetella pertussis NOT DETECTED NOT DETECTED Final   Bordetella Parapertussis NOT DETECTED NOT DETECTED Final   Chlamydophila pneumoniae NOT DETECTED NOT DETECTED Final   Mycoplasma pneumoniae NOT DETECTED NOT DETECTED Final    Comment: Performed at [facility], [address], [place], [place] [address]    Time coordinating discharge: 25 minutes  SIGNED: Mennie LAMY, MD  [facility] 10/31/2024, 12:41 PM  If 7PM-7AM, please contact night-coverage [email or web address]       [1] No Known Allergies

## 2024-10-31 NOTE — Plan of Care (Signed)

## 2024-10-31 NOTE — Progress Notes (Deleted)
 " PROGRESS NOTE William Wells  FMW:[id number] DOB: Aug 29, 1934 DOA: 10/28/2024 PCP: Shona Norleen PEDLAR, MD  Brief Narrative/Hospital Course: William Wells is a 89 y.o. male with PMH of  HLD,T2DM, CKD 3b, hypothyroidism, CAD, heart block with pacer, stage I adenocarcinoma of the lung S/P LU lobectomy in 2021, and recent pneumonia diagnosis who presented with progressive symptoms despite outpatient treatment. Patient reported that he began developing fatigue, malaise, and productive cough several days ago, seen early in the course at an urgent care where he was diagnosed with pneumonia and started on antibiotics, completing azithromycin 1/23 and still on Augmentin till 1/24, but patient having worsening fatigue with ongoing productive cough malaise and night sweats although no fever or chills or weight loss. He reports right leg swelling for the past couple months but no left leg swelling. In ED: afebrile and saturating well on room air with normal RR, normal HR, and stable BP.  Labs- creatinine 2.00, normal WBC, and hemoglobin 10.2. CT chest WO>>progressive consolidation in the left base-new since prior study, thick-walled basilar fluid collection-suggesting loculated fluid similar to prior study with severe diffuse emphysema. Patient was placed on IV antibiotic and admitted for further management, ultrasound-guided thoracentesis ordered. New finding appears to be left lower lobe ground glass opacity that is very subtle and is new compared to the CT scan in October 2025. He follows with Dr. Belva for his lung cancer and neck CT scan coming up in July 2025. Patient with classic presentation of infectious pneumonia with fever chills that has now improved. Ultrasound-guided thoracentesis showed scarring, seen by pulmonary, at this time advised to complete antibiotic course, he is renal panel was positive. Overall patient is clinically improved and will plan to discharge home He has a follow-up with Dr. Sherrod in  July 2026 for CT scan  Subjective: Seen and examined  Daughter at the bedside no new complaints. Overnight vitals stable afebrile labs with creatinine further improved 1.6 mild chronic anemia with stable folate B12 and slightly low iron   Assessment and plan  LLL pneumonia left pleural effusion with loculation-chronic left fibrothorax since 2022 Severe emphysema Rhinovirus infection: Patient is clinically improved following IV antibiotics.  Seen by IR no fluid to drain, per pulmonary patient has chronic left fibrothorax since 2022 no changes, at this time no further intervention needed LLL GGO's new indicating infectious etiology. Overall clinically improving-keep IV antibiotics while here on discharge change to p.o. antibiotics,cont bronchodilators and patient follow-up with his pulmonary team.Legionella antigen pending strep antigen negative.  CAD   No chest pain, continue ASA, Plavix , and Lipitor      HTN: BPl-controlled. Resume telmisartan on discharge.  Type II DM  A1c was 7.1% in October 2025, diabetes well-controlled. Monitor Recent Labs  Lab 10/30/24 1808 10/30/24 2022 10/31/24 0037 10/31/24 0442 10/31/24 0743  GLUCAP 199* 143* 155* 127* 173*    CKD 3B:  Remains stable. Monitor renal function intermittently  Recent Labs    08/19/24 0218 08/20/24 0225 08/21/24 0244 08/22/24 0238 09/08/24 1542 09/14/24 0938 10/28/24 2049 10/29/24 0431 10/30/24 0616 10/31/24 0018  BUN 37* 37* 37* 41* 37* 21 30* 27* 26* 31*  CREATININE 2.19* 2.32* 2.29* 2.32* 1.95* 1.84* 2.00* 1.78* 1.75* 1.61*  CO2 20* 24 24 21* 23 23 26 25 24 24   K 5.6* 4.9 4.1 4.6 5.2 3.9 4.8 4.1 4.5 4.0    Hypothyroidism  Continue Synthroid .    Chronic anemia: Mild drop in hemoglobin around 9 to 11 g, monitor check anemia panel  in a.m.  Mildly elevated troponin: Flat in 19> 19 likely demand ischemia in the setting of pneumonia  RLE swelling  Venous Doppler is negative.  BPH: Cont  finasteride   Deconditioning/debility: Mobilize with PT OT PT Orders: Active PT Follow up Rec: Home Health Pt1/24/2026 1300    DVT prophylaxis: enoxaparin  (LOVENOX ) injection 40 mg Start: 10/29/24 1215 SCDs Start: 10/29/24 0046  Code Status:   Code Status: Limited: Do not attempt resuscitation (DNR) -DNR-LIMITED -Do Not Intubate/DNI  Family Communication: plan of care discussed with patient and his daughter at bedside. Patient status is: Remains hospitalized because of severity of illness Level of care: Telemetry   Dispo: The patient is from: Home            Anticipated disposition: HOME W/ HH.  Patient daughter hesitant given road condition to go home today-awaiting family decision.  Objective: Vitals last 24 hrs: Vitals:   10/30/24 2017 10/31/24 0023 10/31/24 0443 10/31/24 0743  BP: 111/73 (!) 109/58 (!) 114/56 130/64  Pulse: 82 85 77 79  Resp: 17  16   Temp: 97.6 F (36.4 C) 98 F (36.7 C)  98.5 F (36.9 C)  TempSrc: Oral     SpO2: 100% 98% 99% 97%  Weight:      Height:       Physical Examination: General exam: AAOX3 HEENT:Oral mucosa moist, Ear/Nose WNL grossly Respiratory system: Diminished BS left more than right,  Cardiovascular system: S1 & S2 +, No JVD. Gastrointestinal system: Abdomen soft,NT,ND, BS+ Nervous System: Alert, awake, non focal Extremities: extremities warm, leg edema + on right lower extremity Skin: Warm, no rashes MSK: Normal muscle bulk,tone, power    Unresulted Labs (From admission, onward)     Start     Ordered   11/05/24 0500  Creatinine, serum  (enoxaparin  (LOVENOX )    CrCl >/= 30 ml/min)  Weekly,   R     Comments: while on enoxaparin  therapy    10/29/24 1116   10/29/24 0047  Legionella Pneumophila Serogp 1 Ur Ag  (COPD / Pneumonia / Cellulitis / Lower Extremity Wound (Diabetic Foot Infection))  Once,   R        10/29/24 0049   10/29/24 0047  Expectorated Sputum Assessment w Gram Stain, Rflx to Resp Cult  (COPD / Pneumonia / Cellulitis  / Lower Extremity Wound (Diabetic Foot Infection))  Once,   R        10/29/24 0049           Data Reviewed: I have personally reviewed following labs and imaging studies ( see epic result tab) CBC: Recent Labs  Lab 10/28/24 2049 10/29/24 0431 10/30/24 0616 10/31/24 0018  WBC 7.9 7.4 8.2 7.5  HGB 10.2* 9.1* 9.4* 9.3*  HCT 32.0* 27.6* 28.6* 27.8*  MCV 89.1 86.8 86.7 86.9  PLT 221 188 204 197   CMP: Recent Labs  Lab 10/28/24 2049 10/29/24 0431 10/30/24 0616 10/31/24 0018  NA 140 139 136 138  K 4.8 4.1 4.5 4.0  CL 103 105 104 103  CO2 26 25 24 24   GLUCOSE 104* 118* 172* 151*  BUN 30* 27* 26* 31*  CREATININE 2.00* 1.78* 1.75* 1.61*  CALCIUM  9.5 9.1 9.2 8.8*   GFR: Estimated Creatinine Clearance: 30.7 mL/min (A) (by C-G formula based on SCr of 1.61 mg/dL (H)). Recent Labs  Lab 10/29/24 0431  AST 21  ALT 33  ALKPHOS 65  BILITOT 0.3  PROT 6.8  ALBUMIN  3.3*   No results for input(s): LIPASE,  AMYLASE in the last 168 hours. No results for input(s): AMMONIA in the last 168 hours. Coagulation Profile: No results for input(s): INR, PROTIME in the last 168 hours. Antimicrobials/Microbiology: Anti-infectives (From admission, onward)    Start     Dose/Rate Route Frequency Ordered Stop   10/29/24 1000  cefTRIAXone  (ROCEPHIN ) 2 g in sodium chloride  0.9 % 100 mL IVPB        2 g 200 mL/hr over 30 Minutes Intravenous Every 24 hours 10/29/24 0049 11/02/24 0959   10/29/24 1000  doxycycline  (VIBRA -TABS) tablet 100 mg        100 mg Oral Every 12 hours 10/29/24 0049     10/28/24 2245  cefTRIAXone  (ROCEPHIN ) 1 g in sodium chloride  0.9 % 100 mL IVPB        1 g 200 mL/hr over 30 Minutes Intravenous  Once 10/28/24 2230 10/28/24 2342   10/28/24 2245  doxycycline  (VIBRA -TABS) tablet 100 mg        100 mg Oral  Once 10/28/24 2230 10/28/24 2311         Component Value Date/Time   SDES BLOOD SITE NOT SPECIFIED 08/08/2024 1453   SPECREQUEST  08/08/2024 1453    BOTTLES DRAWN  AEROBIC AND ANAEROBIC Blood Culture adequate volume   CULT  08/08/2024 1453    NO GROWTH 5 DAYS Performed at [facility], [address], [place], [place] [address]    REPTSTATUS 08/13/2024 FINAL 08/08/2024 1453    Procedures:  Mennie LAMY, MD [facility] 10/31/2024, 10:23 AM   "

## 2024-11-01 LAB — LEGIONELLA PNEUMOPHILA SEROGP 1 UR AG
L. pneumophila Serogp 1 Ur Ag: NEGATIVE
Source of Sample: 41499

## 2024-11-30 ENCOUNTER — Ambulatory Visit: Admitting: Cardiology

## 2024-12-21 ENCOUNTER — Ambulatory Visit: Admitting: Cardiology

## 2024-12-29 ENCOUNTER — Ambulatory Visit

## 2025-03-30 ENCOUNTER — Ambulatory Visit

## 2025-04-19 ENCOUNTER — Other Ambulatory Visit

## 2025-04-26 ENCOUNTER — Ambulatory Visit: Admitting: Internal Medicine

## 2025-06-29 ENCOUNTER — Ambulatory Visit
